# Patient Record
Sex: Male | Born: 1955
Health system: Southern US, Community
[De-identification: ages and names within clinical notes are randomized; demographics above are authoritative.]

## PROBLEM LIST (undated history)

## (undated) DIAGNOSIS — C119 Malignant neoplasm of nasopharynx, unspecified: Secondary | ICD-10-CM

## (undated) DIAGNOSIS — R4701 Aphasia: Secondary | ICD-10-CM

## (undated) DIAGNOSIS — I10 Essential (primary) hypertension: Secondary | ICD-10-CM

## (undated) DIAGNOSIS — C801 Malignant (primary) neoplasm, unspecified: Secondary | ICD-10-CM

## (undated) DIAGNOSIS — Z923 Personal history of irradiation: Secondary | ICD-10-CM

## (undated) DIAGNOSIS — H5440 Blindness, one eye, unspecified eye: Secondary | ICD-10-CM

## (undated) DIAGNOSIS — Z8619 Personal history of other infectious and parasitic diseases: Secondary | ICD-10-CM

## (undated) DIAGNOSIS — C61 Malignant neoplasm of prostate: Secondary | ICD-10-CM

## (undated) DIAGNOSIS — M1990 Unspecified osteoarthritis, unspecified site: Secondary | ICD-10-CM

## (undated) DIAGNOSIS — E890 Postprocedural hypothyroidism: Secondary | ICD-10-CM

## (undated) DIAGNOSIS — C760 Malignant neoplasm of head, face and neck: Secondary | ICD-10-CM

## (undated) HISTORY — PX: LYMPH NODE BIOPSY: SHX201

## (undated) HISTORY — DX: Malignant neoplasm of head, face and neck: C76.0

## (undated) HISTORY — PX: LARYNGOSCOPY: SUR817

---

## 2003-12-01 ENCOUNTER — Ambulatory Visit (HOSPITAL_COMMUNITY): Admission: RE | Admit: 2003-12-01 | Discharge: 2003-12-01 | Payer: Self-pay | Admitting: Family Medicine

## 2005-01-15 ENCOUNTER — Ambulatory Visit: Admission: RE | Admit: 2005-01-15 | Discharge: 2005-04-15 | Payer: Self-pay | Admitting: Radiation Oncology

## 2006-10-16 ENCOUNTER — Ambulatory Visit (HOSPITAL_COMMUNITY): Admission: RE | Admit: 2006-10-16 | Discharge: 2006-10-16 | Payer: Self-pay | Admitting: Family Medicine

## 2010-08-16 ENCOUNTER — Emergency Department (HOSPITAL_COMMUNITY): Admission: EM | Admit: 2010-08-16 | Discharge: 2010-08-16 | Payer: Self-pay | Admitting: Emergency Medicine

## 2011-08-19 ENCOUNTER — Emergency Department (HOSPITAL_COMMUNITY)
Admission: EM | Admit: 2011-08-19 | Discharge: 2011-08-19 | Disposition: A | Discharge status: Home / Self Care | Payer: Medicare Other | Attending: Emergency Medicine | Admitting: Emergency Medicine

## 2011-08-19 DIAGNOSIS — L723 Sebaceous cyst: Secondary | ICD-10-CM | POA: Insufficient documentation

## 2011-08-19 DIAGNOSIS — F17200 Nicotine dependence, unspecified, uncomplicated: Secondary | ICD-10-CM | POA: Insufficient documentation

## 2011-08-19 MED ORDER — DOXYCYCLINE HYCLATE 100 MG PO CAPS: 100.0000 mg | ORAL_CAPSULE | Freq: Two times a day (BID) | ORAL | Status: AC

## 2011-08-19 MED ORDER — LIDOCAINE HCL (PF) 1 % IJ SOLN
5.0000 mL | Freq: Once | INTRAMUSCULAR | Status: DC
  Filled 2011-08-19: qty 5

## 2011-08-19 NOTE — ED Notes (Signed)
Pt has cyst near left ear. No drainage noted. Pt states the cyst has been there for months

## 2011-08-19 NOTE — ED Notes (Signed)
Pt states he has had a growth on the left side of his face for months. Wanted to get it checked out today

## 2011-08-19 NOTE — ED Provider Notes (Signed)
History     CSN: [id number] Arrival date & time: 08/19/2011  9:40 AM  Chief Complaint  Patient presents with  . Cyst   HPI Comments: Patient c/o "knot" to his left face for several months.  States he became concerned about it after a friend told him that it needed to be "checked out".  He denies fever, pain, redness or recent change in its appearence  Patient is a 55 y.o. male presenting with abscess. The history is provided by the patient and the spouse.  Abscess  This is a chronic problem. The current episode started more than one week ago. The onset is undetermined. The problem occurs continuously. The problem has been unchanged. The abscess is present on the face. The problem is mild. The abscess is characterized by swelling. It is unknown what he was exposed to. Pertinent negatives include no decrease in physical activity, no fever, no diarrhea, no vomiting, no rhinorrhea, no sore throat and no cough. His past medical history does not include skin abscesses in family. There were no sick contacts. He has received no recent medical care.    History reviewed. No pertinent past medical history.  History reviewed. No pertinent past surgical history.  History reviewed. No pertinent family history.  History  Substance Use Topics  . Smoking status: Current Everyday Smoker    Types: Cigarettes  . Smokeless tobacco: Not on file  . Alcohol Use: No      Review of Systems  Constitutional: Negative for fever and chills.  HENT: Negative for ear pain, sore throat, rhinorrhea, neck pain and neck stiffness.   Respiratory: Negative for cough.   Gastrointestinal: Negative for vomiting and diarrhea.  Skin: Positive for wound.  Neurological: Negative for weakness, numbness and headaches.  Hematological: Negative for adenopathy. Does not bruise/bleed easily.  All other systems reviewed and are negative.    Physical Exam  BP 153/74  Pulse 102  Temp(Src) 97.8 F (36.6 C) (Oral)  Resp 18   Ht 6\' 3"  (1.905 m)  SpO2 99%  Physical Exam  Nursing note and vitals reviewed. Constitutional: He is oriented to person, place, and time. He appears well-developed and well-nourished. No distress.  HENT:  Head: Normocephalic and atraumatic.  Right Ear: External ear normal.  Left Ear: External ear normal.  Mouth/Throat: Oropharynx is clear and moist.       No mastoid tenderness or ear pain  Eyes: EOM are normal. Pupils are equal, round, and reactive to light.  Neck: Normal range of motion. Neck supple. No JVD present. No thyromegaly present.  Cardiovascular: Normal rate, regular rhythm and normal heart sounds.   Musculoskeletal: He exhibits no edema and no tenderness.  Lymphadenopathy:    He has no cervical adenopathy.  Neurological: He is alert and oriented to person, place, and time.  Skin:       Fluctuant, flesh colored  mass to left face just below the earlobe.  No drainage, erythema     ED Course  Procedures  MDM   4 cm papule just below the left earlobe.  Flesh colored with moderate fluctuation.  No surrounding erythema.  Likely sebaceous cyst w/o infection.  I will begin abx therapy and give pt referral for gen surgery under advisement of the EDP  Patient / Family / Caregiver understand and agree with initial ED impression and plan with expectations set for ED visit.   The patient appears reasonably screened and/or stabilized for discharge and I doubt any other medical condition or other  EMC requiring further screening, evaluation, or treatment in the ED at this time prior to discharge.       Briella Hobday L. Lizania Bouchard, [place] 08/27/11 1244

## 2011-09-04 NOTE — ED Provider Notes (Signed)
Medical screening examination/treatment/procedure(s) were performed by non-physician practitioner and as supervising physician I was immediately available for consultation/collaboration.   Joya Gaskins, MD 09/04/11 [phone]

## 2011-09-15 ENCOUNTER — Ambulatory Visit: Payer: Medicare Other | Admitting: Family Medicine

## 2011-09-16 ENCOUNTER — Other Ambulatory Visit (HOSPITAL_COMMUNITY): Payer: Self-pay | Admitting: Urology

## 2011-09-16 DIAGNOSIS — C61 Malignant neoplasm of prostate: Secondary | ICD-10-CM

## 2011-09-22 ENCOUNTER — Encounter (HOSPITAL_COMMUNITY): Payer: Medicare Other

## 2011-10-02 ENCOUNTER — Other Ambulatory Visit (HOSPITAL_COMMUNITY): Payer: Self-pay | Admitting: Internal Medicine

## 2011-10-02 ENCOUNTER — Ambulatory Visit (HOSPITAL_COMMUNITY)
Admission: RE | Admit: 2011-10-02 | Discharge: 2011-10-02 | Disposition: A | Discharge status: Home / Self Care | Payer: Medicare Other | Source: Ambulatory Visit | Attending: Internal Medicine | Admitting: Internal Medicine

## 2011-10-02 DIAGNOSIS — J40 Bronchitis, not specified as acute or chronic: Secondary | ICD-10-CM

## 2011-10-06 ENCOUNTER — Telehealth: Payer: Self-pay

## 2011-10-06 ENCOUNTER — Other Ambulatory Visit: Payer: Self-pay

## 2011-10-06 DIAGNOSIS — Z139 Encounter for screening, unspecified: Secondary | ICD-10-CM

## 2011-10-06 NOTE — Telephone Encounter (Signed)
OK for colonoscopy.  

## 2011-10-06 NOTE — Telephone Encounter (Signed)
Gastroenterology Pre-Procedure Form  Request Date: 10/06/2011    Requesting Physician: Dr. Felecia Shelling     PATIENT INFORMATION:  Brandon Hall is a 55 y.o., male (DOB=1956/07/09).  PROCEDURE: Procedure(s) requested: colonoscopy Procedure Reason: screening for colon cancer  PATIENT REVIEW QUESTIONS: The patient reports the following:   1. Diabetes Melitis: no 2. Joint replacements in the past 12 months: no 3. Major health problems in the past 3 months: no 4. Has an artificial valve or MVP:no 5. Has been advised in past to take antibiotics in advance of a procedure like teeth cleaning: no}    MEDICATIONS & ALLERGIES:    Patient reports the following regarding taking any blood thinners:   Plavix? no Aspirin?no Coumadin?  no  Patient confirms/reports the following medications:  No current outpatient prescriptions on file.    Patient confirms/reports the following allergies:  No Known Allergies  Patient is appropriate to schedule for requested procedure(s): yes  AUTHORIZATION INFORMATION Primary Insurance:   ID #:  Group #:  Pre-Cert / Auth required: Pre-Cert / Auth #:   Secondary Insurance: ,ID #:  Group #:  Pre-Cert / Auth required:  Pre-Cert / Auth #:   No orders of the defined types were placed in this encounter.    SCHEDULE INFORMATION: Procedure has been scheduled as follows:  Date: 10/20/2011      Time: 1:00 PM  Location: [facility] Short Stay  This Gastroenterology Pre-Precedure Form is being routed to the following provider(s) for review: R. Roetta Sessions, MD

## 2011-10-07 NOTE — Telephone Encounter (Signed)
Rx and instructions mailed to pt.  

## 2011-10-09 ENCOUNTER — Other Ambulatory Visit (HOSPITAL_COMMUNITY): Payer: Self-pay | Admitting: "Endocrinology

## 2011-10-09 DIAGNOSIS — E0590 Thyrotoxicosis, unspecified without thyrotoxic crisis or storm: Secondary | ICD-10-CM

## 2011-10-13 ENCOUNTER — Encounter (HOSPITAL_COMMUNITY): Payer: Self-pay

## 2011-10-13 ENCOUNTER — Encounter (HOSPITAL_COMMUNITY)
Admission: RE | Admit: 2011-10-13 | Discharge: 2011-10-13 | Disposition: A | Discharge status: Home / Self Care | Payer: Medicare Other | Source: Ambulatory Visit | Attending: "Endocrinology | Admitting: "Endocrinology

## 2011-10-13 DIAGNOSIS — E0590 Thyrotoxicosis, unspecified without thyrotoxic crisis or storm: Secondary | ICD-10-CM | POA: Insufficient documentation

## 2011-10-13 HISTORY — DX: Malignant (primary) neoplasm, unspecified: C80.1

## 2011-10-13 MED ORDER — SODIUM IODIDE I 131 CAPSULE
10.0000 | Freq: Once | INTRAVENOUS | Status: AC | PRN
  Administered 2011-10-13: 8 via ORAL

## 2011-10-14 ENCOUNTER — Encounter (HOSPITAL_COMMUNITY)
Admission: RE | Admit: 2011-10-14 | Discharge: 2011-10-14 | Disposition: A | Discharge status: Home / Self Care | Payer: Medicare Other | Source: Ambulatory Visit | Attending: "Endocrinology | Admitting: "Endocrinology

## 2011-10-14 DIAGNOSIS — E0590 Thyrotoxicosis, unspecified without thyrotoxic crisis or storm: Secondary | ICD-10-CM | POA: Insufficient documentation

## 2011-10-14 MED ORDER — SODIUM PERTECHNETATE TC 99M INJECTION
10.0000 | Freq: Once | INTRAVENOUS | Status: AC | PRN
  Administered 2011-10-14: 9.9 via INTRAVENOUS

## 2011-10-16 ENCOUNTER — Other Ambulatory Visit (HOSPITAL_COMMUNITY): Payer: Self-pay | Admitting: "Endocrinology

## 2011-10-16 DIAGNOSIS — E0500 Thyrotoxicosis with diffuse goiter without thyrotoxic crisis or storm: Secondary | ICD-10-CM

## 2011-10-17 ENCOUNTER — Telehealth: Payer: Self-pay | Admitting: Gastroenterology

## 2011-10-17 ENCOUNTER — Ambulatory Visit (HOSPITAL_COMMUNITY): Payer: Medicare Other

## 2011-10-17 MED ORDER — SODIUM CHLORIDE 0.45 % IV SOLN
Freq: Once | INTRAVENOUS | Status: AC
  Administered 2011-10-20: 11:00:00 via INTRAVENOUS

## 2011-10-17 NOTE — Telephone Encounter (Signed)
Pt procedure time moved up due to cancellation in the schedule- Pt is aware of new arrival time-11:00

## 2011-10-20 ENCOUNTER — Encounter (HOSPITAL_COMMUNITY)
Admission: RE | Disposition: A | Discharge status: Home / Self Care | Payer: Self-pay | Source: Ambulatory Visit | Attending: Internal Medicine

## 2011-10-20 ENCOUNTER — Ambulatory Visit (HOSPITAL_COMMUNITY)
Admission: RE | Admit: 2011-10-20 | Discharge: 2011-10-20 | Disposition: A | Discharge status: Home / Self Care | Payer: Medicare Other | Source: Ambulatory Visit | Attending: Internal Medicine | Admitting: Internal Medicine

## 2011-10-20 ENCOUNTER — Other Ambulatory Visit: Payer: Self-pay | Admitting: Internal Medicine

## 2011-10-20 ENCOUNTER — Encounter (HOSPITAL_COMMUNITY): Payer: Self-pay | Admitting: *Deleted

## 2011-10-20 DIAGNOSIS — Z1211 Encounter for screening for malignant neoplasm of colon: Secondary | ICD-10-CM | POA: Insufficient documentation

## 2011-10-20 DIAGNOSIS — Z79899 Other long term (current) drug therapy: Secondary | ICD-10-CM | POA: Insufficient documentation

## 2011-10-20 DIAGNOSIS — Z139 Encounter for screening, unspecified: Secondary | ICD-10-CM

## 2011-10-20 DIAGNOSIS — I10 Essential (primary) hypertension: Secondary | ICD-10-CM | POA: Insufficient documentation

## 2011-10-20 DIAGNOSIS — K5730 Diverticulosis of large intestine without perforation or abscess without bleeding: Secondary | ICD-10-CM

## 2011-10-20 DIAGNOSIS — R933 Abnormal findings on diagnostic imaging of other parts of digestive tract: Secondary | ICD-10-CM

## 2011-10-20 DIAGNOSIS — D126 Benign neoplasm of colon, unspecified: Secondary | ICD-10-CM | POA: Insufficient documentation

## 2011-10-20 HISTORY — DX: Essential (primary) hypertension: I10

## 2011-10-20 HISTORY — PX: COLONOSCOPY: SHX5424

## 2011-10-20 SURGERY — COLONOSCOPY
Anesthesia: Moderate Sedation

## 2011-10-20 MED ORDER — MIDAZOLAM HCL 5 MG/5ML IJ SOLN
INTRAMUSCULAR | Status: DC | PRN
  Administered 2011-10-20: 2 mg via INTRAVENOUS
  Administered 2011-10-20: 1 mg via INTRAVENOUS

## 2011-10-20 MED ORDER — STERILE WATER FOR IRRIGATION IR SOLN
Status: DC | PRN
  Administered 2011-10-20: 12:00:00

## 2011-10-20 MED ORDER — MEPERIDINE HCL 100 MG/ML IJ SOLN
INTRAMUSCULAR | Status: AC
  Filled 2011-10-20: qty 2

## 2011-10-20 MED ORDER — MIDAZOLAM HCL 5 MG/5ML IJ SOLN
INTRAMUSCULAR | Status: AC
  Filled 2011-10-20: qty 10

## 2011-10-20 MED ORDER — MEPERIDINE HCL 100 MG/ML IJ SOLN
INTRAMUSCULAR | Status: DC | PRN
  Administered 2011-10-20: 25 mg via INTRAVENOUS

## 2011-10-20 NOTE — H&P (Signed)
  Primary Care Physician:  Alice Reichert, MD Primary Gastroenterologist:  Dr. Jena Gauss  Pre-Procedure History & Physical: HPI:  Brandon Hall is a 55 y.o. male is here for a screening colonoscopy. No lower GI tract symptoms. No family history of colon polyps or colon cancer. This is the patient's first colonoscopy  Past Medical History  Diagnosis Date  . Hypertension   . Cancer     prostate cancer    History reviewed. No pertinent past surgical history.  Prior to Admission medications   Medication Sig Start Date End Date Taking? Authorizing Provider  propranolol (INDERAL) 20 MG tablet Take 20 mg by mouth 3 (three) times daily.     Yes Historical Provider, MD    Allergies as of 10/06/2011  . (No Known Allergies)    Family History  Problem Relation Age of Onset  . Colon cancer Father     History   Social History  . Marital Status: Married    Spouse Name: N/A    Number of Children: N/A  . Years of Education: N/A   Occupational History  . Not on file.   Social History Main Topics  . Smoking status: Current Everyday Smoker -- 1.0 packs/day for 40 years    Types: Cigarettes  . Smokeless tobacco: Not on file  . Alcohol Use: No  . Drug Use: No  . Sexually Active:    Other Topics Concern  . Not on file   Social History Narrative  . No narrative on file    Review of Systems: See HPI, otherwise negative ROS  Physical Exam: BP 141/85  Pulse 97  Temp(Src) 97.6 F (36.4 C) (Oral)  Resp 28  Ht 6' (1.829 m)  Wt 145 lb (65.772 kg)  BMI 19.67 kg/m2  SpO2 98% General:   Alert,  Well-developed, well-nourished, pleasant and cooperative in NAD Head:  Normocephalic and atraumatic. Mouth:  No deformity or lesions, dentition normal. Neck:  Supple; no masses or thyromegaly. Lungs:  Clear throughout to auscultation.   No wheezes, crackles, or rhonchi. No acute distress. Heart:  Regular rate and rhythm; no murmurs, clicks, rubs,  or gallops. Abdomen:  Soft, nontender and  nondistended. No masses, hepatosplenomegaly or hernias noted. Normal bowel sounds, without guarding, and without rebound.   Pulses:  Normal pulses noted. Extremities:  Without clubbing or edema. Impression/Plan: Brandon Hall is now here to undergo a screening colonoscopy.  First average risk examination the  Risks, benefits, limitations, imponderables and alternatives regarding colonoscopy have been reviewed with the patient. Questions have been answered. All parties agreeable.

## 2011-10-23 ENCOUNTER — Encounter (HOSPITAL_COMMUNITY)
Admission: RE | Admit: 2011-10-23 | Discharge: 2011-10-23 | Disposition: A | Discharge status: Home / Self Care | Payer: Medicare Other | Source: Ambulatory Visit | Attending: "Endocrinology | Admitting: "Endocrinology

## 2011-10-23 DIAGNOSIS — E0500 Thyrotoxicosis with diffuse goiter without thyrotoxic crisis or storm: Secondary | ICD-10-CM | POA: Insufficient documentation

## 2011-10-23 MED ORDER — SODIUM IODIDE I 131 CAPSULE
12.0000 | Freq: Once | INTRAVENOUS | Status: AC | PRN
  Administered 2011-10-23: 12 via ORAL

## 2011-10-24 ENCOUNTER — Encounter (HOSPITAL_COMMUNITY): Payer: Self-pay | Admitting: Internal Medicine

## 2012-02-23 ENCOUNTER — Encounter (HOSPITAL_COMMUNITY)
Admission: RE | Admit: 2012-02-23 | Discharge: 2012-02-23 | Disposition: A | Discharge status: Home / Self Care | Payer: Medicare Other | Source: Ambulatory Visit | Attending: Urology | Admitting: Urology

## 2012-02-23 ENCOUNTER — Encounter (HOSPITAL_COMMUNITY): Payer: Self-pay

## 2012-02-23 DIAGNOSIS — C61 Malignant neoplasm of prostate: Secondary | ICD-10-CM | POA: Insufficient documentation

## 2012-02-23 MED ORDER — TECHNETIUM TC 99M MEDRONATE IV KIT
25.0000 | PACK | Freq: Once | INTRAVENOUS | Status: AC | PRN
  Administered 2012-02-23: 24.8 via INTRAVENOUS

## 2012-05-19 ENCOUNTER — Other Ambulatory Visit (HOSPITAL_COMMUNITY): Payer: Self-pay | Admitting: Urology

## 2012-05-19 DIAGNOSIS — C61 Malignant neoplasm of prostate: Secondary | ICD-10-CM

## 2012-05-26 ENCOUNTER — Ambulatory Visit (HOSPITAL_COMMUNITY)
Admission: RE | Admit: 2012-05-26 | Discharge: 2012-05-26 | Disposition: A | Discharge status: Home / Self Care | Payer: Medicare Other | Source: Ambulatory Visit | Attending: Urology | Admitting: Urology

## 2012-05-26 DIAGNOSIS — C61 Malignant neoplasm of prostate: Secondary | ICD-10-CM

## 2012-05-26 DIAGNOSIS — N281 Cyst of kidney, acquired: Secondary | ICD-10-CM | POA: Insufficient documentation

## 2012-11-15 ENCOUNTER — Other Ambulatory Visit (HOSPITAL_COMMUNITY): Payer: Self-pay | Admitting: "Endocrinology

## 2012-11-15 DIAGNOSIS — E0500 Thyrotoxicosis with diffuse goiter without thyrotoxic crisis or storm: Secondary | ICD-10-CM

## 2012-11-29 ENCOUNTER — Encounter (HOSPITAL_COMMUNITY): Payer: Commercial Managed Care - HMO

## 2012-11-30 ENCOUNTER — Encounter (HOSPITAL_COMMUNITY): Payer: Medicare HMO

## 2012-12-10 ENCOUNTER — Ambulatory Visit (HOSPITAL_COMMUNITY): Payer: Medicare Other

## 2013-01-20 ENCOUNTER — Encounter (HOSPITAL_COMMUNITY): Payer: Self-pay

## 2013-01-20 ENCOUNTER — Encounter (HOSPITAL_COMMUNITY)
Admission: RE | Admit: 2013-01-20 | Discharge: 2013-01-20 | Disposition: A | Discharge status: Home / Self Care | Payer: Medicare Other | Source: Ambulatory Visit | Attending: "Endocrinology | Admitting: "Endocrinology

## 2013-01-20 DIAGNOSIS — E0590 Thyrotoxicosis, unspecified without thyrotoxic crisis or storm: Secondary | ICD-10-CM | POA: Insufficient documentation

## 2013-01-20 DIAGNOSIS — E0500 Thyrotoxicosis with diffuse goiter without thyrotoxic crisis or storm: Secondary | ICD-10-CM

## 2013-01-20 MED ORDER — SODIUM IODIDE I 131 CAPSULE
8.0000 | Freq: Once | INTRAVENOUS | Status: AC | PRN
  Administered 2013-01-20: 8 via ORAL

## 2013-01-21 ENCOUNTER — Encounter (HOSPITAL_COMMUNITY)
Admission: RE | Admit: 2013-01-21 | Discharge: 2013-01-21 | Disposition: A | Discharge status: Home / Self Care | Payer: Medicare Other | Source: Ambulatory Visit | Attending: "Endocrinology | Admitting: "Endocrinology

## 2013-01-21 MED ORDER — SODIUM PERTECHNETATE TC 99M INJECTION
10.0000 | Freq: Once | INTRAVENOUS | Status: AC | PRN
  Administered 2013-01-21: 10 via INTRAVENOUS

## 2013-01-26 ENCOUNTER — Encounter (HOSPITAL_COMMUNITY)
Admission: RE | Admit: 2013-01-26 | Discharge: 2013-01-26 | Disposition: A | Discharge status: Home / Self Care | Payer: Medicare Other | Source: Ambulatory Visit | Attending: "Endocrinology | Admitting: "Endocrinology

## 2013-01-26 ENCOUNTER — Encounter (HOSPITAL_COMMUNITY): Payer: Self-pay

## 2013-01-26 DIAGNOSIS — E0500 Thyrotoxicosis with diffuse goiter without thyrotoxic crisis or storm: Secondary | ICD-10-CM | POA: Insufficient documentation

## 2013-01-26 MED ORDER — SODIUM IODIDE I 131 CAPSULE
20.0000 | Freq: Once | INTRAVENOUS | Status: AC | PRN
  Administered 2013-01-26: 20 via ORAL

## 2013-08-07 ENCOUNTER — Encounter (HOSPITAL_COMMUNITY): Payer: Self-pay | Admitting: Emergency Medicine

## 2013-08-07 ENCOUNTER — Emergency Department (HOSPITAL_COMMUNITY)
Admission: EM | Admit: 2013-08-07 | Discharge: 2013-08-07 | Disposition: A | Discharge status: Home / Self Care | Payer: Medicare Other | Attending: Emergency Medicine | Admitting: Emergency Medicine

## 2013-08-07 DIAGNOSIS — I10 Essential (primary) hypertension: Secondary | ICD-10-CM | POA: Insufficient documentation

## 2013-08-07 DIAGNOSIS — F17200 Nicotine dependence, unspecified, uncomplicated: Secondary | ICD-10-CM | POA: Insufficient documentation

## 2013-08-07 DIAGNOSIS — Z8546 Personal history of malignant neoplasm of prostate: Secondary | ICD-10-CM | POA: Insufficient documentation

## 2013-08-07 DIAGNOSIS — L0231 Cutaneous abscess of buttock: Secondary | ICD-10-CM | POA: Insufficient documentation

## 2013-08-07 MED ORDER — LIDOCAINE HCL (PF) 1 % IJ SOLN
30.0000 mL | Freq: Once | INTRAMUSCULAR | Status: AC
  Administered 2013-08-07: 30 mL
  Filled 2013-08-07: qty 5

## 2013-08-07 MED ORDER — ONDANSETRON 8 MG PO TBDP
8.0000 mg | ORAL_TABLET | Freq: Once | ORAL | Status: AC
  Administered 2013-08-07: 8 mg via ORAL
  Filled 2013-08-07: qty 1

## 2013-08-07 MED ORDER — HYDROCODONE-ACETAMINOPHEN 5-325 MG PO TABS: 1.0000 | ORAL_TABLET | ORAL | Status: DC | PRN

## 2013-08-07 MED ORDER — HYDROMORPHONE HCL PF 1 MG/ML IJ SOLN
1.0000 mg | Freq: Once | INTRAMUSCULAR | Status: AC
  Administered 2013-08-07: 1 mg via INTRAMUSCULAR
  Filled 2013-08-07: qty 1

## 2013-08-07 MED ORDER — DOXYCYCLINE HYCLATE 100 MG PO CAPS: 100.0000 mg | ORAL_CAPSULE | Freq: Two times a day (BID) | ORAL | Status: DC

## 2013-08-07 MED ORDER — CEFTRIAXONE SODIUM 1 G IJ SOLR
1.0000 g | Freq: Once | INTRAMUSCULAR | Status: AC
  Administered 2013-08-07: 1 g via INTRAMUSCULAR
  Filled 2013-08-07: qty 10

## 2013-08-07 NOTE — ED Notes (Signed)
Pa neese to pt's bedside, abcess drained of large amount of pus/blood.  Pt tolerated well.  Wound culture sent to lab.  Sterile dressing applied to wound.

## 2013-08-07 NOTE — ED Notes (Signed)
States that he has a "boil" on his left buttock that started "one day last week."

## 2013-08-07 NOTE — ED Notes (Signed)
Pt alert & oriented x4, stable gait. Patient given discharge instructions, paperwork & prescription(s). Patient  instructed to stop at the registration desk to finish any additional paperwork. Patient verbalized understanding. Pt left department w/ no further questions. 

## 2013-08-07 NOTE — ED Provider Notes (Signed)
CSN: [id number]     Arrival date & time 08/07/13  0865 History     First MD Initiated Contact with Patient 08/07/13 [phone]     Chief Complaint  Patient presents with  . Abscess   (Consider location/radiation/quality/duration/timing/severity/associated sxs/prior Treatment) Patient is a 57 y.o. male presenting with abscess. The history is provided by the patient.  Abscess Location:  Ano-genital Ano-genital abscess location:  L buttock Size:  Large Abscess quality: fluctuance, painful, redness and warmth   Red streaking: no   Duration:  1 week Progression:  Worsening Pain details:    Quality:  Sharp and burning   Severity:  Severe   Timing:  Constant   Progression:  Worsening Chronicity:  New Context: not diabetes, not insect bite/sting and not skin injury   Relieved by:  Nothing Worsened by:  Nothing tried Ineffective treatments: OTC Boil Ease. Associated symptoms: no fever, no headaches, no nausea and no vomiting    Brandon Hall is a 57 y.o. male who presents to the ED with an abscess on his left buttock that started a week ago. The area has become more swollen and painful despite using Boil Ease.   Past Medical History  Diagnosis Date  . Hypertension   . Cancer     prostate cancer   Past Surgical History  Procedure Laterality Date  . Colonoscopy  10/20/2011    Procedure: COLONOSCOPY;  Surgeon: Corbin Ade, MD;  Location: [facility];  Service: Endoscopy;  Laterality: N/A;  1:00 pm   Family History  Problem Relation Age of Onset  . Colon cancer Father    History  Substance Use Topics  . Smoking status: Current Every Day Smoker -- 1.00 packs/day for 40 years    Types: Cigarettes  . Smokeless tobacco: Not on file  . Alcohol Use: No    Review of Systems  Constitutional: Negative for fever and chills.  Gastrointestinal: Negative for nausea, vomiting and abdominal pain.  Genitourinary:       Abscess left buttock  Musculoskeletal: Negative for back pain.   Skin: Positive for wound.  Neurological: Negative for headaches.  Psychiatric/Behavioral: The patient is not nervous/anxious.     Allergies  Review of patient's allergies indicates no known allergies.  Home Medications   Current Outpatient Rx  Name  Route  Sig  Dispense  Refill  . Benzocaine (BOIL-EASE EX)   Apply externally   Apply 1 application topically daily as needed (boil).          BP 139/72  Pulse 97  Temp(Src) 98.6 F (37 C)  Resp 18  Wt 140 lb (63.504 kg)  BMI 18.98 kg/m2  SpO2 97% Physical Exam  Nursing note and vitals reviewed. Constitutional: He is oriented to person, place, and time. He appears well-developed and well-nourished. No distress.  HENT:  Head: Normocephalic.  Eyes: EOM are normal.  Neck: Neck supple.  Cardiovascular: Normal rate.   Pulmonary/Chest: Effort normal.  Abdominal: Soft. There is no tenderness.  Musculoskeletal: Normal range of motion.  Neurological: He is alert and oriented to person, place, and time. No cranial nerve deficit.  Skin:  Approximately 6 cm. Tender, raised, fluctuant area left buttock.    Psychiatric: He has a normal mood and affect. His behavior is normal.    ED Course   Procedures INCISION AND DRAINAGE Performed by: Tucker Steedley Consent: Verbal consent obtained. Risks and benefits: risks, benefits and alternatives were discussed Type: abscess  Body area: left buttock  Draped with sterile  towels  Dilaudid 1 mg IM given 30 minutes prior to procedure  Cleaned with betadine  Draped with sterile towels   Anesthesia: local infiltration  Local anesthetic: lidocaine 1% without epinephrine  Anesthetic total: 3 ml  Incision made with # 11 blade   Complexity: complex Blunt dissection to break up loculations  Drainage: purulent  Drainage amount: LARGE  Irrigated with NSS  Packing material: 1/4 in iodoform gauze  Patient tolerance: Patient tolerated the procedure well with no immediate  complications.  Culture obtained from drainage   MDM  58 y.o. male with large abscess to left buttock. I&D with good results. Patient feeling better. Will give Rocephin 1 gram IM now and Rx for antibiotics. Patient to return in 2 days for follow up. He will return sooner for any problems.  Discussed with the patient and all questioned fully answered.   Medication List    TAKE these medications       doxycycline 100 MG capsule  Commonly known as:  VIBRAMYCIN  Take 1 capsule (100 mg total) by mouth 2 (two) times daily.     HYDROcodone-acetaminophen 5-325 MG per tablet  Commonly known as:  NORCO/VICODIN  Take 1 tablet by mouth every 4 (four) hours as needed.      ASK your doctor about these medications       BOIL-EASE EX  Apply 1 application topically daily as needed (boil).          [facility] Orlene Och, [place] 08/07/13 [phone]

## 2013-08-09 ENCOUNTER — Emergency Department (HOSPITAL_COMMUNITY)
Admission: EM | Admit: 2013-08-09 | Discharge: 2013-08-09 | Disposition: A | Discharge status: Home / Self Care | Payer: Medicare Other | Attending: Emergency Medicine | Admitting: Emergency Medicine

## 2013-08-09 ENCOUNTER — Encounter (HOSPITAL_COMMUNITY): Payer: Self-pay | Admitting: *Deleted

## 2013-08-09 DIAGNOSIS — Z8546 Personal history of malignant neoplasm of prostate: Secondary | ICD-10-CM | POA: Insufficient documentation

## 2013-08-09 DIAGNOSIS — I10 Essential (primary) hypertension: Secondary | ICD-10-CM | POA: Insufficient documentation

## 2013-08-09 DIAGNOSIS — F17200 Nicotine dependence, unspecified, uncomplicated: Secondary | ICD-10-CM | POA: Insufficient documentation

## 2013-08-09 DIAGNOSIS — Z5189 Encounter for other specified aftercare: Secondary | ICD-10-CM

## 2013-08-09 DIAGNOSIS — Z4800 Encounter for change or removal of nonsurgical wound dressing: Secondary | ICD-10-CM | POA: Insufficient documentation

## 2013-08-09 NOTE — ED Provider Notes (Signed)
  CSN: [id number]     Arrival date & time 08/09/13  1035 History     First MD Initiated Contact with Patient 08/09/13 1109     Chief Complaint  Patient presents with  . Wound Check   (Consider location/radiation/quality/duration/timing/severity/associated sxs/prior Treatment) HPI Brandon Hall is a 57 y.o. male who presents to the ED for recheck of abscess to the left buttock that required I&D. He has been taking doxycycline and the area has continued to drain. The packing has come out. Patient states he is feeling much better. No fever or other problems.  Past Medical History  Diagnosis Date  . Hypertension   . Cancer     prostate cancer   Past Surgical History  Procedure Laterality Date  . Colonoscopy  10/20/2011    Procedure: COLONOSCOPY;  Surgeon: Corbin Ade, MD;  Location: [facility];  Service: Endoscopy;  Laterality: N/A;  1:00 pm   Family History  Problem Relation Age of Onset  . Colon cancer Father    History  Substance Use Topics  . Smoking status: Current Every Day Smoker -- 1.00 packs/day for 40 years    Types: Cigarettes  . Smokeless tobacco: Not on file  . Alcohol Use: No    Review of Systems  Constitutional: Negative for fever and chills.  Gastrointestinal: Negative for nausea and vomiting.  Skin: Positive for wound.  Psychiatric/Behavioral: The patient is not nervous/anxious.     Allergies  Review of patient's allergies indicates no known allergies.  Home Medications   Current Outpatient Rx  Name  Route  Sig  Dispense  Refill  . Benzocaine (BOIL-EASE EX)   Apply externally   Apply 1 application topically daily as needed (boil).         Marland Kitchen doxycycline (VIBRAMYCIN) 100 MG capsule   Oral   Take 1 capsule (100 mg total) by mouth 2 (two) times daily.   20 capsule   0   . HYDROcodone-acetaminophen (NORCO/VICODIN) 5-325 MG per tablet   Oral   Take 1 tablet by mouth every 4 (four) hours as needed.   15 tablet   0    BP 139/96  Pulse 70   Temp(Src) 97.2 F (36.2 C) (Oral)  Resp 20  Ht 6' (1.829 m)  Wt 140 lb (63.504 kg)  BMI 18.98 kg/m2  SpO2 100% Physical Exam  Nursing note and vitals reviewed. Constitutional: He is oriented to person, place, and time. He appears well-developed and well-nourished. No distress.  HENT:  Head: Normocephalic.  Neck: Neck supple.  Cardiovascular: Normal rate.   Pulmonary/Chest: Effort normal.  Musculoskeletal:  Draining wound left buttock, less erythema, tenderness  and swelling today.  Neurological: He is alert and oriented to person, place, and time. No cranial nerve deficit.  Skin: Skin is warm and dry.  Psychiatric: He has a normal mood and affect. His behavior is normal.    ED Course   Procedures  MDM  57 y.o. male with improving abscess to left buttock. He will sit in warm tubs of water or apply warm wet compresses to the area to help with continued drainage. He will return for any problems.  He will continue his antibiotics and return as needed.   [facility] Orlene Och, NP 08/09/13 1758

## 2013-08-09 NOTE — ED Notes (Signed)
Pt here for recheck of abscess to buttock  Region, states that it was drained Friday, pt states that area is "doing good"

## 2013-08-10 LAB — CULTURE, ROUTINE-ABSCESS

## 2013-08-10 NOTE — ED Provider Notes (Signed)
Medical screening examination/treatment/procedure(s) were performed by non-physician practitioner and as supervising physician I was immediately available for consultation/collaboration. Aviel Davalos, MD, FACEP   Kaylinn Dedic L Waylon Koffler, MD 08/10/13 1142 

## 2013-08-10 NOTE — ED Provider Notes (Signed)
Medical screening examination/treatment/procedure(s) were performed by non-physician practitioner and as supervising physician I was immediately available for consultation/collaboration.  Seaira Byus, MD 08/10/13 0918 

## 2013-09-30 ENCOUNTER — Encounter: Payer: Self-pay | Admitting: Family Medicine

## 2013-09-30 ENCOUNTER — Ambulatory Visit (INDEPENDENT_AMBULATORY_CARE_PROVIDER_SITE_OTHER): Payer: Medicare Other | Admitting: Family Medicine

## 2013-09-30 VITALS — BP 136/85 | HR 74 | Temp 97.7°F | Ht 70.5 in | Wt 139.0 lb

## 2013-09-30 DIAGNOSIS — R030 Elevated blood-pressure reading, without diagnosis of hypertension: Secondary | ICD-10-CM

## 2013-09-30 DIAGNOSIS — R748 Abnormal levels of other serum enzymes: Secondary | ICD-10-CM

## 2013-09-30 DIAGNOSIS — E0500 Thyrotoxicosis with diffuse goiter without thyrotoxic crisis or storm: Secondary | ICD-10-CM | POA: Insufficient documentation

## 2013-09-30 DIAGNOSIS — E785 Hyperlipidemia, unspecified: Secondary | ICD-10-CM | POA: Insufficient documentation

## 2013-09-30 DIAGNOSIS — C801 Malignant (primary) neoplasm, unspecified: Secondary | ICD-10-CM

## 2013-09-30 DIAGNOSIS — Z23 Encounter for immunization: Secondary | ICD-10-CM

## 2013-09-30 DIAGNOSIS — I10 Essential (primary) hypertension: Secondary | ICD-10-CM | POA: Insufficient documentation

## 2013-09-30 DIAGNOSIS — F17200 Nicotine dependence, unspecified, uncomplicated: Secondary | ICD-10-CM

## 2013-09-30 DIAGNOSIS — Z720 Tobacco use: Secondary | ICD-10-CM | POA: Insufficient documentation

## 2013-09-30 DIAGNOSIS — R7402 Elevation of levels of lactic acid dehydrogenase (LDH): Secondary | ICD-10-CM

## 2013-09-30 DIAGNOSIS — IMO0001 Reserved for inherently not codable concepts without codable children: Secondary | ICD-10-CM

## 2013-09-30 DIAGNOSIS — H5440 Blindness, one eye, unspecified eye: Secondary | ICD-10-CM | POA: Insufficient documentation

## 2013-09-30 NOTE — Progress Notes (Signed)
Patient ID: Brandon Hall, male   DOB: Jan 31, 1956, 57 y.o.   MRN: [id number] SUBJECTIVE: CC: Chief Complaint  Patient presents with  . Establish Care    HPI: 1) h/o prostate cancer but further evaluation was negative.  2) h/o hyperthyroidism: had 2 radioactive iodine treatments. Doing fine.  Breakfast: eggs and bacon Lunch: weiner Supper: green beans salad, sometime hamburger helper.  Past Medical History  Diagnosis Date  . Cancer     prostate cancer  . Hypertension   . Thyroid disease   . Graves disease    Past Surgical History  Procedure Laterality Date  . Colonoscopy  10/20/2011    Procedure: COLONOSCOPY;  Surgeon: Corbin Ade, MD;  Location: [facility];  Service: Endoscopy;  Laterality: N/A;  1:00 pm   History   Social History  . Marital Status: Married    Spouse Name: N/A    Number of Children: N/A  . Years of Education: N/A   Occupational History  . Not on file.   Social History Main Topics  . Smoking status: Current Every Day Smoker -- 1.00 packs/day for 40 years    Types: Cigarettes  . Smokeless tobacco: Not on file  . Alcohol Use: No  . Drug Use: No  . Sexual Activity:    Other Topics Concern  . Not on file   Social History Narrative  . No narrative on file   Family History  Problem Relation Age of Onset  . Colon cancer Father    No current outpatient prescriptions on file prior to visit.   No current facility-administered medications on file prior to visit.   No Known Allergies  There is no immunization history on file for this patient. Prior to Admission medications   Not on File     ROS: As above in the HPI. All other systems are stable or negative.  OBJECTIVE: APPEARANCE:  Patient in no acute distress.The patient appeared well nourished and normally developed. Acyanotic. Waist: VITAL SIGNS:BP 136/85  Pulse 74  Temp(Src) 97.7 F (36.5 C) (Oral)  Ht 5' 10.5" (1.791 m)  Wt 139 lb (63.05 kg)  BMI 19.66 kg/m2  AAM slim  built  SKIN: warm and  Dry without overt rashes, tattoos and scars  HEAD and Neck: without JVD, Head and scalp: normal Eyes:No scleral icterus.Right eye opaque. Left eye Pupil reactive to light . Able to see clearly. Has a catarct Ears: Auricle normal, canal normal, Tympanic membranes normal, insufflation normal. Nose: normal Throat: normal Neck & thyroid: normal  CHEST & LUNGS: Chest wall: normal Lungs: Clear  CVS: Reveals the PMI to be normally located. Regular rhythm, First and Second Heart sounds are normal,  absence of murmurs, rubs or gallops. Peripheral vasculature: Radial pulses: normal Dorsal pedis pulses: normal Posterior pulses: normal  ABDOMEN:  Appearance: normal Benign, no organomegaly, no masses, no Abdominal Aortic enlargement. No Guarding , no rebound. No Bruits. Bowel sounds: normal  RECTAL: N/A GU: N/A  EXTREMETIES: nonedematous.  MUSCULOSKELETAL:  Spine: normal Joints: intact  NEUROLOGIC: oriented to time,place and person; nonfocal. Strength is normal Sensory is normal Reflexes are normal Cranial Nerves are normal.  ASSESSMENT: Blind right eye  Tobacco user  Graves disease - Plan: Thyroid Panel With TSH  Cancer - Plan: PSA, total and free  Dyslipidemia - Plan: CMP14+EGFR, Lipid panel  Elevated BP  Need for prophylactic vaccination and inoculation against influenza    PLAN: Orders Placed This Encounter  Procedures  . CMP14+EGFR  . Lipid  panel  . Thyroid Panel With TSH  . PSA, total and free    smoking cessation counseling and handout in the AVS.  His wife was present who is also an established patient.counselled on healthy eating and exercising.healthy lifestyle recommended.  Health maintenance. Annual eye exam.  No evidence he is Hypertensive.  Return in about 4 months (around 01/31/2014) for Recheck medical problems.  Kenyetta Fife P. [place] Charon, M.D.

## 2013-09-30 NOTE — Patient Instructions (Addendum)
Smoking Cessation Quitting smoking is important to your health and has many advantages. However, it is not always easy to quit since nicotine is a very addictive drug. Often times, people try 3 times or more before being able to quit. This document explains the best ways for you to prepare to quit smoking. Quitting takes hard work and a lot of effort, but you can do it. ADVANTAGES OF QUITTING SMOKING  You will live longer, feel better, and live better.  Your body will feel the impact of quitting smoking almost immediately.  Within 20 minutes, blood pressure decreases. Your pulse returns to its normal level.  After 8 hours, carbon monoxide levels in the blood return to normal. Your oxygen level increases.  After 24 hours, the chance of having a heart attack starts to decrease. Your breath, hair, and body stop smelling like smoke.  After 48 hours, damaged nerve endings begin to recover. Your sense of taste and smell improve.  After 72 hours, the body is virtually free of nicotine. Your bronchial tubes relax and breathing becomes easier.  After 2 to 12 weeks, lungs can hold more air. Exercise becomes easier and circulation improves.  The risk of having a heart attack, stroke, cancer, or lung disease is greatly reduced.  After 1 year, the risk of coronary heart disease is cut in half.  After 5 years, the risk of stroke falls to the same as a nonsmoker.  After 10 years, the risk of lung cancer is cut in half and the risk of other cancers decreases significantly.  After 15 years, the risk of coronary heart disease drops, usually to the level of a nonsmoker.  If you are pregnant, quitting smoking will improve your chances of having a healthy baby.  The people you live with, especially any children, will be healthier.  You will have extra money to spend on things other than cigarettes. QUESTIONS TO THINK ABOUT BEFORE ATTEMPTING TO QUIT You may want to talk about your answers with your  caregiver.  Why do you want to quit?  If you tried to quit in the past, what helped and what did not?  What will be the most difficult situations for you after you quit? How will you plan to handle them?  Who can help you through the tough times? Your family? Friends? A caregiver?  What pleasures do you get from smoking? What ways can you still get pleasure if you quit? Here are some questions to ask your caregiver:  How can you help me to be successful at quitting?  What medicine do you think would be best for me and how should I take it?  What should I do if I need more help?  What is smoking withdrawal like? How can I get information on withdrawal? GET READY  Set a quit date.  Change your environment by getting rid of all cigarettes, ashtrays, matches, and lighters in your home, car, or work. Do not let people smoke in your home.  Review your past attempts to quit. Think about what worked and what did not. GET SUPPORT AND ENCOURAGEMENT You have a better chance of being successful if you have help. You can get support in many ways.  Tell your family, friends, and co-workers that you are going to quit and need their support. Ask them not to smoke around you.  Get individual, group, or telephone counseling and support. Programs are available at local hospitals and health centers. Call your local health department for   information about programs in your area.  Spiritual beliefs and practices may help some smokers quit.  Download a "quit meter" on your computer to keep track of quit statistics, such as how long you have gone without smoking, cigarettes not smoked, and money saved.  Get a self-help book about quitting smoking and staying off of tobacco. LEARN NEW SKILLS AND BEHAVIORS  Distract yourself from urges to smoke. Talk to someone, go for a walk, or occupy your time with a task.  Change your normal routine. Take a different route to work. Drink tea instead of coffee.  Eat breakfast in a different place.  Reduce your stress. Take a hot bath, exercise, or read a book.  Plan something enjoyable to do every day. Reward yourself for not smoking.  Explore interactive web-based programs that specialize in helping you quit. GET MEDICINE AND USE IT CORRECTLY Medicines can help you stop smoking and decrease the urge to smoke. Combining medicine with the above behavioral methods and support can greatly increase your chances of successfully quitting smoking.  Nicotine replacement therapy helps deliver nicotine to your body without the negative effects and risks of smoking. Nicotine replacement therapy includes nicotine gum, lozenges, inhalers, nasal sprays, and skin patches. Some may be available over-the-counter and others require a prescription.  Antidepressant medicine helps people abstain from smoking, but how this works is unknown. This medicine is available by prescription.  Nicotinic receptor partial agonist medicine simulates the effect of nicotine in your brain. This medicine is available by prescription. Ask your caregiver for advice about which medicines to use and how to use them based on your health history. Your caregiver will tell you what side effects to look out for if you choose to be on a medicine or therapy. Carefully read the information on the package. Do not use any other product containing nicotine while using a nicotine replacement product.  RELAPSE OR DIFFICULT SITUATIONS Most relapses occur within the first 3 months after quitting. Do not be discouraged if you start smoking again. Remember, most people try several times before finally quitting. You may have symptoms of withdrawal because your body is used to nicotine. You may crave cigarettes, be irritable, feel very hungry, cough often, get headaches, or have difficulty concentrating. The withdrawal symptoms are only temporary. They are strongest when you first quit, but they will go away within  10 14 days. To reduce the chances of relapse, try to:  Avoid drinking alcohol. Drinking lowers your chances of successfully quitting.  Reduce the amount of caffeine you consume. Once you quit smoking, the amount of caffeine in your body increases and can give you symptoms, such as a rapid heartbeat, sweating, and anxiety.  Avoid smokers because they can make you want to smoke.  Do not let weight gain distract you. Many smokers will gain weight when they quit, usually less than 10 pounds. Eat a healthy diet and stay active. You can always lose the weight gained after you quit.  Find ways to improve your mood other than smoking. FOR MORE INFORMATION  www.smokefree.gov  Document Released: 12/09/2001 Document Revised: 06/15/2012 Document Reviewed: 03/25/2012 ExitCare Patient Information 2014 [employer].  

## 2013-10-01 LAB — CMP14+EGFR
ALT: 47 IU/L — ABNORMAL HIGH (ref 0–44)
AST: 66 IU/L — ABNORMAL HIGH (ref 0–40)
Albumin/Globulin Ratio: 2 (ref 1.1–2.5)
Albumin: 4.9 g/dL (ref 3.5–5.5)
Alkaline Phosphatase: 81 IU/L (ref 39–117)
BUN/Creatinine Ratio: 8 — ABNORMAL LOW (ref 9–20)
BUN: 8 mg/dL (ref 6–24)
CO2: 28 mmol/L (ref 18–29)
Calcium: 9 mg/dL (ref 8.7–10.2)
Chloride: 96 mmol/L — ABNORMAL LOW (ref 97–108)
Creatinine, Ser: 1.04 mg/dL (ref 0.76–1.27)
GFR calc Af Amer: 91 mL/min/{1.73_m2} (ref >59)
GFR calc non Af Amer: 79 mL/min/{1.73_m2} (ref >59)
Globulin, Total: 2.4 g/dL (ref 1.5–4.5)
Glucose: 80 mg/dL (ref 65–99)
Potassium: 4.4 mmol/L (ref 3.5–5.2)
Sodium: 139 mmol/L (ref 134–144)
Total Bilirubin: 0.6 mg/dL (ref 0.0–1.2)
Total Protein: 7.3 g/dL (ref 6.0–8.5)

## 2013-10-01 LAB — THYROID PANEL WITH TSH
Free Thyroxine Index: 0.1 — ABNORMAL LOW (ref 1.2–4.9)
T3 Uptake Ratio: 15 % — ABNORMAL LOW (ref 24–39)
T4, Total: 0.8 ug/dL — CL (ref 4.5–12.0)
TSH: 93.22 u[IU]/mL — ABNORMAL HIGH (ref 0.450–4.500)

## 2013-10-01 LAB — LIPID PANEL
Chol/HDL Ratio: 2.7 ratio units (ref 0.0–5.0)
Cholesterol, Total: 243 mg/dL — ABNORMAL HIGH (ref 100–199)
HDL: 90 mg/dL (ref >39)
LDL Calculated: 140 mg/dL — ABNORMAL HIGH (ref 0–99)
Triglycerides: 67 mg/dL (ref 0–149)
VLDL Cholesterol Cal: 13 mg/dL (ref 5–40)

## 2013-10-01 LAB — PSA, TOTAL AND FREE
PSA, Free Pct: 2.7 %
PSA, Free: 0.27 ng/mL
PSA: 10 ng/mL — ABNORMAL HIGH (ref 0.0–4.0)

## 2013-10-02 ENCOUNTER — Other Ambulatory Visit: Payer: Self-pay | Admitting: Family Medicine

## 2013-10-02 DIAGNOSIS — E039 Hypothyroidism, unspecified: Secondary | ICD-10-CM | POA: Insufficient documentation

## 2013-10-02 DIAGNOSIS — R748 Abnormal levels of other serum enzymes: Secondary | ICD-10-CM | POA: Insufficient documentation

## 2013-10-02 DIAGNOSIS — R972 Elevated prostate specific antigen [PSA]: Secondary | ICD-10-CM

## 2013-10-02 MED ORDER — LEVOTHYROXINE SODIUM 50 MCG PO TABS: 50.0000 ug | ORAL_TABLET | Freq: Every day | ORAL | Status: DC

## 2013-10-02 NOTE — Progress Notes (Signed)
Quick Note:  Labs abnormal.he also needs to come for a follow up in 6 weeks. To recheck the hypothyroidism ______

## 2013-10-02 NOTE — Addendum Note (Signed)
Addended by: Ileana Ladd on: 10/02/2013 09:32 PM   Modules accepted: Orders

## 2013-10-02 NOTE — Progress Notes (Signed)
Quick Note:  Labs abnormal. Now hypothyroid. Also liver enzymes elevated.: added hepatitis and iron test to check. Needs to avoid all alcohol. Start on levothyroxine ordered in [facility]. ______

## 2013-10-14 ENCOUNTER — Other Ambulatory Visit (INDEPENDENT_AMBULATORY_CARE_PROVIDER_SITE_OTHER): Payer: Medicare Other

## 2013-10-14 DIAGNOSIS — R748 Abnormal levels of other serum enzymes: Secondary | ICD-10-CM

## 2013-10-14 DIAGNOSIS — R7402 Elevation of levels of lactic acid dehydrogenase (LDH): Secondary | ICD-10-CM

## 2013-10-14 NOTE — Progress Notes (Signed)
Patient came in for labs only.

## 2013-10-15 LAB — HEPATITIS PANEL, ACUTE
Hep A IgM: NEGATIVE
Hep B C IgM: NEGATIVE
Hep C Virus Ab: <0.1 s/co ratio (ref 0.0–0.9)
Hepatitis B Surface Ag: NEGATIVE

## 2013-10-15 LAB — FERRITIN: Ferritin: 100 ng/mL (ref 30–400)

## 2013-10-15 NOTE — Progress Notes (Signed)
Quick Note:  Call patient. Labs normal for the work up of causes for the elevated liver enzymes.  plan: Avoid all alcohol, tylenol and OTC medications without consulting [place]. RTC in 6 weeks to recheck the liver. ______

## 2013-11-14 ENCOUNTER — Other Ambulatory Visit: Payer: Medicare Other

## 2013-12-01 ENCOUNTER — Ambulatory Visit (INDEPENDENT_AMBULATORY_CARE_PROVIDER_SITE_OTHER): Payer: Medicare Other | Admitting: Family Medicine

## 2013-12-01 ENCOUNTER — Encounter: Payer: Self-pay | Admitting: Family Medicine

## 2013-12-01 VITALS — BP 161/94 | HR 75 | Temp 97.1°F | Ht 70.5 in | Wt 137.0 lb

## 2013-12-01 DIAGNOSIS — L0291 Cutaneous abscess, unspecified: Secondary | ICD-10-CM

## 2013-12-01 MED ORDER — HYDROCODONE-ACETAMINOPHEN 5-325 MG PO TABS: 1.0000 | ORAL_TABLET | ORAL | Status: DC | PRN

## 2013-12-01 MED ORDER — CEFTRIAXONE SODIUM 1 G IJ SOLR
1.0000 g | Freq: Once | INTRAMUSCULAR | Status: AC
  Administered 2013-12-01: 1 g via INTRAMUSCULAR

## 2013-12-01 NOTE — Progress Notes (Signed)
Subjective:    Patient ID: Brandon Hall, male    DOB: Jun 20, 1956, 57 y.o.   MRN: [id number]  HPI This 57 y.o. male presents for evaluation of abscess on left buttock. He is having severe pain.  He denies having any problems with BM's or  Rectal pain.   Review of Systems C/o Left buttock pain.   No chest pain, SOB, HA, dizziness, vision change, N/V, diarrhea, constipation, dysuria, urinary urgency or frequency, myalgias, arthralgias or rash.  Objective:   Physical Exam Vital signs noted  Well developed well nourished male.  HEENT - Head atraumatic Normocephalic Respiratory - Lungs CTA bilateral Cardiac - RRR S1 and S2 without murmur GI - Abdomen soft Nontender and bowel sounds active x 4 Skin - Left buttock abscess approximately 6cm and tender.  Procedure - After prepping abscess/ cyst with betadine, 3 cc's lido w/ epi injected into the cyst and after adequate anesthesia A stab incision is made into the fluctuant upper area of the cyst and serous sanguin drainage is drained approx. 10 cc's.  Bulky 4x4 dressing is applied.        Assessment & Plan:  Abscess - Plan: cefTRIAXone (ROCEPHIN) injection 1 g, Aerobic culture Incision and drainage of abscess.  Bactrim DS one po bid x 10 days #20, Norco 5mg  one po qid Prn #20.  Follow up in 1 week for wound check.  Deatra Canter FNP

## 2013-12-01 NOTE — Patient Instructions (Signed)
Abscess An abscess is an infected area that contains a collection of pus and debris.It can occur in almost any part of the body. An abscess is also known as a furuncle or boil. CAUSES  An abscess occurs when tissue gets infected. This can occur from blockage of oil or sweat glands, infection of hair follicles, or a minor injury to the skin. As the body tries to fight the infection, pus collects in the area and creates pressure under the skin. This pressure causes pain. People with weakened immune systems have difficulty fighting infections and get certain abscesses more often.  SYMPTOMS Usually an abscess develops on the skin and becomes a painful mass that is red, warm, and tender. If the abscess forms under the skin, you may feel a moveable soft area under the skin. Some abscesses break open (rupture) on their own, but most will continue to get worse without care. The infection can spread deeper into the body and eventually into the bloodstream, causing you to feel ill.  DIAGNOSIS  Your caregiver will take your medical history and perform a physical exam. A sample of fluid may also be taken from the abscess to determine what is causing your infection. TREATMENT  Your caregiver may prescribe antibiotic medicines to fight the infection. However, taking antibiotics alone usually does not cure an abscess. Your caregiver may need to make a small cut (incision) in the abscess to drain the pus. In some cases, gauze is packed into the abscess to reduce pain and to continue draining the area. HOME CARE INSTRUCTIONS   Only take over-the-counter or prescription medicines for pain, discomfort, or fever as directed by your caregiver.  If you were prescribed antibiotics, take them as directed. Finish them even if you start to feel better.  If gauze is used, follow your caregiver's directions for changing the gauze.  To avoid spreading the infection:  Keep your draining abscess covered with a  bandage.  Wash your hands well.  Do not share personal care items, towels, or whirlpools with others.  Avoid skin contact with others.  Keep your skin and clothes clean around the abscess.  Keep all follow-up appointments as directed by your caregiver. SEEK MEDICAL CARE IF:   You have increased pain, swelling, redness, fluid drainage, or bleeding.  You have muscle aches, chills, or a general ill feeling.  You have a fever. MAKE SURE YOU:   Understand these instructions.  Will watch your condition.  Will get help right away if you are not doing well or get worse. Document Released: 09/24/2005 Document Revised: 06/15/2012 Document Reviewed: 02/27/2012 ExitCare Patient Information 2014 [employer].  

## 2013-12-05 LAB — AEROBIC CULTURE

## 2013-12-09 ENCOUNTER — Ambulatory Visit (INDEPENDENT_AMBULATORY_CARE_PROVIDER_SITE_OTHER): Payer: Medicare Other | Admitting: Family Medicine

## 2013-12-09 ENCOUNTER — Encounter: Payer: Self-pay | Admitting: Family Medicine

## 2013-12-09 VITALS — BP 150/80 | HR 60 | Temp 97.5°F | Ht 70.5 in | Wt 141.8 lb

## 2013-12-09 DIAGNOSIS — L0291 Cutaneous abscess, unspecified: Secondary | ICD-10-CM

## 2013-12-09 NOTE — Patient Instructions (Signed)
Abscess An abscess is an infected area that contains a collection of pus and debris.It can occur in almost any part of the body. An abscess is also known as a furuncle or boil. CAUSES  An abscess occurs when tissue gets infected. This can occur from blockage of oil or sweat glands, infection of hair follicles, or a minor injury to the skin. As the body tries to fight the infection, pus collects in the area and creates pressure under the skin. This pressure causes pain. People with weakened immune systems have difficulty fighting infections and get certain abscesses more often.  SYMPTOMS Usually an abscess develops on the skin and becomes a painful mass that is red, warm, and tender. If the abscess forms under the skin, you may feel a moveable soft area under the skin. Some abscesses break open (rupture) on their own, but most will continue to get worse without care. The infection can spread deeper into the body and eventually into the bloodstream, causing you to feel ill.  DIAGNOSIS  Your caregiver will take your medical history and perform a physical exam. A sample of fluid may also be taken from the abscess to determine what is causing your infection. TREATMENT  Your caregiver may prescribe antibiotic medicines to fight the infection. However, taking antibiotics alone usually does not cure an abscess. Your caregiver may need to make a small cut (incision) in the abscess to drain the pus. In some cases, gauze is packed into the abscess to reduce pain and to continue draining the area. HOME CARE INSTRUCTIONS   Only take over-the-counter or prescription medicines for pain, discomfort, or fever as directed by your caregiver.  If you were prescribed antibiotics, take them as directed. Finish them even if you start to feel better.  If gauze is used, follow your caregiver's directions for changing the gauze.  To avoid spreading the infection:  Keep your draining abscess covered with a  bandage.  Wash your hands well.  Do not share personal care items, towels, or whirlpools with others.  Avoid skin contact with others.  Keep your skin and clothes clean around the abscess.  Keep all follow-up appointments as directed by your caregiver. SEEK MEDICAL CARE IF:   You have increased pain, swelling, redness, fluid drainage, or bleeding.  You have muscle aches, chills, or a general ill feeling.  You have a fever. MAKE SURE YOU:   Understand these instructions.  Will watch your condition.  Will get help right away if you are not doing well or get worse. Document Released: 09/24/2005 Document Revised: 06/15/2012 Document Reviewed: 02/27/2012 ExitCare Patient Information 2014 [employer].  

## 2013-12-09 NOTE — Progress Notes (Signed)
Subjective:    Patient ID: Brandon Hall, male    DOB: December 04, 1956, 57 y.o.   MRN: [id number]  HPI This 57 y.o. male presents for evaluation of follow up on abscess. He has been treated with Keflex and bactrim a week ago.  He is feeling a lot better.   Review of Systems C/o abscess left buttock. No chest pain, SOB, HA, dizziness, vision change, N/V, diarrhea, constipation, dysuria, urinary urgency or frequency, myalgias, arthralgias or rash.     Objective:   Physical Exam  Vital signs noted  Well developed well nourished male.  HEENT - Head atraumatic Normocephalic                Eyes - PERRLA, Conjuctiva - clear Sclera- Clear EOMI                Ears - EAC's Wnl TM's Wnl Gross Hearing WNL                Throat - oropharanx wnl Respiratory - Lungs CTA bilateral Cardiac - RRR S1 and S2 without murmur GI - Abdomen soft Nontender and bowel sounds active x 4 Extremities - No edema. Neuro - Grossly intact. Skin - abscess left buttock resolving and healing well.     Assessment & Plan:  Cellulitis and abscess Continue bactrim DS. Wound is just about resolved and instructed to follow up prn.  Deatra Canter FNP

## 2013-12-28 ENCOUNTER — Other Ambulatory Visit (INDEPENDENT_AMBULATORY_CARE_PROVIDER_SITE_OTHER): Payer: Medicare Other

## 2013-12-28 DIAGNOSIS — R7989 Other specified abnormal findings of blood chemistry: Secondary | ICD-10-CM

## 2013-12-28 NOTE — Progress Notes (Signed)
Patient came in for labs only.

## 2013-12-29 LAB — HEPATIC FUNCTION PANEL
ALT: 15 IU/L (ref 0–44)
AST: 29 IU/L (ref 0–40)
Albumin: 5.1 g/dL (ref 3.5–5.5)
Alkaline Phosphatase: 71 IU/L (ref 39–117)
Bilirubin, Direct: 0.09 mg/dL (ref 0.00–0.40)
Total Bilirubin: 0.5 mg/dL (ref 0.0–1.2)
Total Protein: 7.6 g/dL (ref 6.0–8.5)

## 2013-12-30 NOTE — Progress Notes (Signed)
Quick Note:  Call patient. Labs normal now. No change in plan. ______

## 2014-01-19 ENCOUNTER — Other Ambulatory Visit (HOSPITAL_COMMUNITY): Payer: Self-pay | Admitting: Urology

## 2014-01-19 DIAGNOSIS — C61 Malignant neoplasm of prostate: Secondary | ICD-10-CM

## 2014-01-26 ENCOUNTER — Encounter: Payer: Self-pay | Admitting: Nurse Practitioner

## 2014-01-26 ENCOUNTER — Ambulatory Visit (INDEPENDENT_AMBULATORY_CARE_PROVIDER_SITE_OTHER): Payer: Medicare HMO | Admitting: Nurse Practitioner

## 2014-01-26 VITALS — BP 163/85 | HR 64 | Temp 96.8°F | Ht 70.5 in | Wt 139.0 lb

## 2014-01-26 DIAGNOSIS — B029 Zoster without complications: Secondary | ICD-10-CM

## 2014-01-26 NOTE — Patient Instructions (Signed)
Shingles Shingles (herpes zoster) is an infection that is caused by the same virus that causes chickenpox (varicella). The infection causes a painful skin rash and fluid-filled blisters, which eventually break open, crust over, and heal. It may occur in any area of the body, but it usually affects only one side of the body or face. The pain of shingles usually lasts about 1 month. However, some people with shingles may develop long-term (chronic) pain in the affected area of the body. Shingles often occurs many years after the person had chickenpox. It is more common:  In people older than 50 years.  In people with weakened immune systems, such as those with HIV, AIDS, or cancer.  In people taking medicines that weaken the immune system, such as transplant medicines.  In people under great stress. CAUSES  Shingles is caused by the varicella zoster virus (VZV), which also causes chickenpox. After a person is infected with the virus, it can remain in the person's body for years in an inactive state (dormant). To cause shingles, the virus reactivates and breaks out as an infection in a nerve root. The virus can be spread from person to person (contagious) through contact with open blisters of the shingles rash. It will only spread to people who have not had chickenpox. When these people are exposed to the virus, they may develop chickenpox. They will not develop shingles. Once the blisters scab over, the person is no longer contagious and cannot spread the virus to others. SYMPTOMS  Shingles shows up in stages. The initial symptoms may be pain, itching, and tingling in an area of the skin. This pain is usually described as burning, stabbing, or throbbing.In a few days or weeks, a painful red rash will appear in the area where the pain, itching, and tingling were felt. The rash is usually on one side of the body in a band or belt-like pattern. Then, the rash usually turns into fluid-filled blisters. They  will scab over and dry up in approximately 2 3 weeks. Flu-like symptoms may also occur with the initial symptoms, the rash, or the blisters. These may include:  Fever.  Chills.  Headache.  Upset stomach. DIAGNOSIS  Your caregiver will perform a skin exam to diagnose shingles. Skin scrapings or fluid samples may also be taken from the blisters. This sample will be examined under a microscope or sent to a lab for further testing. TREATMENT  There is no specific cure for shingles. Your caregiver will likely prescribe medicines to help you manage the pain, recover faster, and avoid long-term problems. This may include antiviral drugs, anti-inflammatory drugs, and pain medicines. HOME CARE INSTRUCTIONS   Take a cool bath or apply cool compresses to the area of the rash or blisters as directed. This may help with the pain and itching.   Only take over-the-counter or prescription medicines as directed by your caregiver.   Rest as directed by your caregiver.  Keep your rash and blisters clean with mild soap and cool water or as directed by your caregiver.  Do not pick your blisters or scratch your rash. Apply an anti-itch cream or numbing creams to the affected area as directed by your caregiver.  Keep your shingles rash covered with a loose bandage (dressing).  Avoid skin contact with:  Babies.   Pregnant women.   Children with eczema.   Elderly people with transplants.   People with chronic illnesses, such as leukemia or AIDS.   Wear loose-fitting clothing to help ease   the pain of material rubbing against the rash.  Keep all follow-up appointments with your caregiver.If the area involved is on your face, you may receive a referral for follow-up to a specialist, such as an eye doctor (ophthalmologist) or an ear, nose, and throat (ENT) doctor. Keeping all follow-up appointments will help you avoid eye complications, chronic pain, or disability.  SEEK IMMEDIATE MEDICAL  CARE IF:   You have facial pain, pain around the eye area, or loss of feeling on one side of your face.  You have ear pain or ringing in your ear.  You have loss of taste.  Your pain is not relieved with prescribed medicines.   Your redness or swelling spreads.   You have more pain and swelling.  Your condition is worsening or has changed.   You have a feveror persistent symptoms for more than 2 3 days.  You have a fever and your symptoms suddenly get worse. MAKE SURE YOU:  Understand these instructions.  Will watch your condition.  Will get help right away if you are not doing well or get worse. Document Released: 12/15/2005 Document Revised: 09/08/2012 Document Reviewed: 07/29/2012 ExitCare Patient Information 2014 [employer].  

## 2014-01-26 NOTE — Progress Notes (Signed)
Subjective:    Patient ID: Brandon Hall, male    DOB: 1956/05/07, 58 y.o.   MRN: [id number]  HPI Patient in c/o rash that started over a week ago- on right shoulder and neck- itches and burns    Review of Systems  Constitutional: Negative.   HENT: Negative.   Respiratory: Negative.   Cardiovascular: Negative.   Musculoskeletal: Negative.   All other systems reviewed and are negative.       Objective:   Physical Exam  Constitutional: He appears well-developed and well-nourished.  Cardiovascular: Normal rate, regular rhythm and normal heart sounds.   Pulmonary/Chest: Effort normal and breath sounds normal.  Skin:  Erythematous patchy rash t=right shoulder with most lesions scabbed over.    BP 163/85  Pulse 64  Temp(Src) 96.8 F (36 C) (Oral)  Ht 5' 10.5" (1.791 m)  Wt 139 lb (63.05 kg)  BMI 19.66 kg/m2       Assessment & Plan:   1. Shingles rash    Hydrocortisone cream OTC Avoid scratching RTOprn Has had to kong to treat with valtrex  [name]-Margaret Daphine Deutscher, FNP

## 2014-01-28 ENCOUNTER — Other Ambulatory Visit: Payer: Self-pay | Admitting: Family Medicine

## 2014-01-31 ENCOUNTER — Ambulatory Visit: Payer: Medicare Other | Admitting: Family Medicine

## 2014-02-03 ENCOUNTER — Encounter: Payer: Self-pay | Admitting: Family Medicine

## 2014-02-03 ENCOUNTER — Ambulatory Visit (INDEPENDENT_AMBULATORY_CARE_PROVIDER_SITE_OTHER): Payer: Medicare HMO | Admitting: Family Medicine

## 2014-02-03 VITALS — BP 126/85 | HR 65 | Temp 97.5°F | Ht 70.5 in | Wt 138.6 lb

## 2014-02-03 DIAGNOSIS — H5440 Blindness, one eye, unspecified eye: Secondary | ICD-10-CM

## 2014-02-03 DIAGNOSIS — E785 Hyperlipidemia, unspecified: Secondary | ICD-10-CM

## 2014-02-03 DIAGNOSIS — R7402 Elevation of levels of lactic acid dehydrogenase (LDH): Secondary | ICD-10-CM

## 2014-02-03 DIAGNOSIS — C801 Malignant (primary) neoplasm, unspecified: Secondary | ICD-10-CM

## 2014-02-03 DIAGNOSIS — Z720 Tobacco use: Secondary | ICD-10-CM

## 2014-02-03 DIAGNOSIS — E0500 Thyrotoxicosis with diffuse goiter without thyrotoxic crisis or storm: Secondary | ICD-10-CM

## 2014-02-03 DIAGNOSIS — B029 Zoster without complications: Secondary | ICD-10-CM | POA: Insufficient documentation

## 2014-02-03 DIAGNOSIS — I10 Essential (primary) hypertension: Secondary | ICD-10-CM

## 2014-02-03 DIAGNOSIS — F17200 Nicotine dependence, unspecified, uncomplicated: Secondary | ICD-10-CM

## 2014-02-03 DIAGNOSIS — R748 Abnormal levels of other serum enzymes: Secondary | ICD-10-CM

## 2014-02-03 DIAGNOSIS — E039 Hypothyroidism, unspecified: Secondary | ICD-10-CM

## 2014-02-03 DIAGNOSIS — R740 Nonspecific elevation of levels of transaminase and lactic acid dehydrogenase [LDH]: Secondary | ICD-10-CM

## 2014-02-03 NOTE — Progress Notes (Signed)
Patient ID: Brandon Hall, male   DOB: 03-10-1956, 58 y.o.   MRN: [id number] SUBJECTIVE: CC: Chief Complaint  Patient presents with  . Follow-up    4 month follow up chronic problems  reck shingles right arm back     HPI: Patient is here for follow up of hypertension/hypothyroidism/: denies Headache;deniesChest Pain;denies weakness;denies Shortness of Breath or Orthopnea;denies Visual changes;denies palpitations;denies cough;denies pedal edema;denies symptoms of TIA or stroke; admits to Compliance with medications. denies Problems with medications. He has an appointment for a bone scan soon for the follow up on his prostate cancer. He has stopped smoking.  Past Medical History  Diagnosis Date  . Cancer     prostate cancer  . Hypertension   . Thyroid disease   . Graves disease    Past Surgical History  Procedure Laterality Date  . Colonoscopy  10/20/2011    Procedure: COLONOSCOPY;  Surgeon: Corbin Ade, MD;  Location: [facility];  Service: Endoscopy;  Laterality: N/A;  1:00 pm   History   Social History  . Marital Status: Married    Spouse Name: N/A    Number of Children: N/A  . Years of Education: N/A   Occupational History  . Not on file.   Social History Main Topics  . Smoking status: Former Smoker -- 1.00 packs/day for 40 years    Types: Cigarettes  . Smokeless tobacco: Former Neurosurgeon    Quit date: 09/28/2013  . Alcohol Use: No  . Drug Use: No  . Sexual Activity: Not on file   Other Topics Concern  . Not on file   Social History Narrative  . No narrative on file   Family History  Problem Relation Age of Onset  . Colon cancer Father    Current Outpatient Prescriptions on File Prior to Visit  Medication Sig Dispense Refill  . HYDROcodone-acetaminophen (NORCO/VICODIN) 5-325 MG per tablet Take 1 tablet by mouth every 4 (four) hours as needed.  20 tablet  0  . levothyroxine (SYNTHROID, LEVOTHROID) 50 MCG tablet TAKE 1 TABLET DAILY  30 tablet  8   No  current facility-administered medications on file prior to visit.   No Known Allergies Immunization History  Administered Date(s) Administered  . Influenza,inj,Quad PF,36+ Mos 09/30/2013   Prior to Admission medications   Medication Sig Start Date End Date Taking? Authorizing Provider  HYDROcodone-acetaminophen (NORCO/VICODIN) 5-325 MG per tablet Take 1 tablet by mouth every 4 (four) hours as needed. 12/01/13  Yes Deatra Canter, FNP  hydrocortisone cream 1 % Apply 1 application topically 2 (two) times daily.   Yes Historical Provider, MD  levothyroxine (SYNTHROID, LEVOTHROID) 50 MCG tablet TAKE 1 TABLET DAILY 01/28/14  Yes [name]-Margaret Daphine Deutscher, FNP     ROS: As above in the HPI. All other systems are stable or negative.  OBJECTIVE: APPEARANCE:  Patient in no acute distress.The patient appeared well nourished and normally developed. Acyanotic. Waist: VITAL SIGNS:BP 126/85  Pulse 65  Temp(Src) 97.5 F (36.4 C) (Oral)  Ht 5' 10.5" (1.791 m)  Wt 138 lb 9.6 oz (62.869 kg)  BMI 19.60 kg/m2 AAM  SKIN: warm and  Dry with residual shingles scars on the right shoulder and the right scapula area.  HEAD and Neck: without JVD, Head and scalp: normal Eyes:No scleral icterus.no change. 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 Nose: normal Throat: normal Neck & thyroid: normal  CHEST & LUNGS: Chest wall: normal Lungs: Clear  CVS: Reveals the PMI to be normally located. Regular rhythm, First and Second Heart sounds are normal,  absence of murmurs, rubs or gallops.  ABDOMEN:  Appearance: normal Benign, no organomegaly, no masses, no Abdominal Aortic enlargement. No Guarding , no rebound. No Bruits. Bowel sounds: normal  RECTAL: N/A GU: N/A  EXTREMETIES: nonedematous.  NEUROLOGIC: oriented to time,place and person; nonfocal. Results for orders placed in visit on 12/28/13  HEPATIC  FUNCTION PANEL      Result Value Range   Total Protein 7.6  6.0 - 8.5 g/dL   Albumin 5.1  3.5 - 5.5 g/dL   Total Bilirubin 0.5  0.0 - 1.2 mg/dL   Bilirubin, Direct 2.44  0.00 - 0.40 mg/dL   Alkaline Phosphatase 71  39 - 117 IU/L   AST 29  0 - 40 IU/L   ALT 15  0 - 44 IU/L    ASSESSMENT: Hypertension - Plan: CMP14+EGFR  Graves disease - Plan: TSH  Tobacco user - stopped smoking  Cancer  Blind right eye  Dyslipidemia - Plan: Lipid panel  Shingles rash  Hypothyroid  Abnormal transaminases The shingles outbreak is resolving with no neuralgia complication.  PLAN: Praised him for stopping smoking.  Healthy lifestyle  Discussed. Healthy eating and exercise.  Orders Placed This Encounter  Procedures  . CMP14+EGFR  . TSH  . Lipid panel   Meds ordered this encounter  Medications  . hydrocortisone cream 1 %    Sig: Apply 1 application topically 2 (two) times daily.  he can stop using the steroid ointment.  There are no discontinued medications. Return in about 3 months (around 05/03/2014) for Recheck medical problems.  Michae Grimley P. [place] Charon, M.D.

## 2014-02-04 ENCOUNTER — Other Ambulatory Visit: Payer: Self-pay | Admitting: Family Medicine

## 2014-02-04 DIAGNOSIS — E039 Hypothyroidism, unspecified: Secondary | ICD-10-CM

## 2014-02-04 LAB — CMP14+EGFR
ALT: 7 IU/L (ref 0–44)
AST: 19 IU/L (ref 0–40)
Albumin/Globulin Ratio: 1.7 (ref 1.1–2.5)
Albumin: 4.8 g/dL (ref 3.5–5.5)
Alkaline Phosphatase: 72 IU/L (ref 39–117)
BUN/Creatinine Ratio: 15 (ref 9–20)
BUN: 13 mg/dL (ref 6–24)
CO2: 25 mmol/L (ref 18–29)
Calcium: 9.4 mg/dL (ref 8.7–10.2)
Chloride: 98 mmol/L (ref 97–108)
Creatinine, Ser: 0.88 mg/dL (ref 0.76–1.27)
GFR calc Af Amer: 109 mL/min/{1.73_m2} (ref >59)
GFR calc non Af Amer: 95 mL/min/{1.73_m2} (ref >59)
Globulin, Total: 2.8 g/dL (ref 1.5–4.5)
Glucose: 86 mg/dL (ref 65–99)
Potassium: 4.2 mmol/L (ref 3.5–5.2)
Sodium: 140 mmol/L (ref 134–144)
Total Bilirubin: 0.6 mg/dL (ref 0.0–1.2)
Total Protein: 7.6 g/dL (ref 6.0–8.5)

## 2014-02-04 LAB — LIPID PANEL
Chol/HDL Ratio: 2.7 ratio units (ref 0.0–5.0)
Cholesterol, Total: 208 mg/dL — ABNORMAL HIGH (ref 100–199)
HDL: 78 mg/dL (ref >39)
LDL Calculated: 120 mg/dL — ABNORMAL HIGH (ref 0–99)
Triglycerides: 48 mg/dL (ref 0–149)
VLDL Cholesterol Cal: 10 mg/dL (ref 5–40)

## 2014-02-04 LAB — TSH: TSH: 27.54 u[IU]/mL — ABNORMAL HIGH (ref 0.450–4.500)

## 2014-02-04 MED ORDER — LEVOTHYROXINE SODIUM 75 MCG PO TABS: ORAL_TABLET | ORAL | Status: DC

## 2014-02-07 ENCOUNTER — Telehealth: Payer: Self-pay | Admitting: Family Medicine

## 2014-02-07 NOTE — Telephone Encounter (Signed)
Wife notified that his refill is ready for pick up at [facility] and has been ready since 02-04-14

## 2014-02-13 ENCOUNTER — Ambulatory Visit (HOSPITAL_COMMUNITY)
Admission: RE | Admit: 2014-02-13 | Discharge: 2014-02-13 | Disposition: A | Discharge status: Home / Self Care | Payer: Medicare HMO | Source: Ambulatory Visit | Attending: Urology | Admitting: Urology

## 2014-02-13 ENCOUNTER — Encounter (HOSPITAL_COMMUNITY)
Admission: RE | Admit: 2014-02-13 | Discharge: 2014-02-13 | Disposition: A | Discharge status: Home / Self Care | Payer: Medicare HMO | Source: Ambulatory Visit | Attending: Urology | Admitting: Urology

## 2014-02-13 DIAGNOSIS — C61 Malignant neoplasm of prostate: Secondary | ICD-10-CM | POA: Insufficient documentation

## 2014-02-13 MED ORDER — TECHNETIUM TC 99M MEDRONATE IV KIT
25.3000 | PACK | Freq: Once | INTRAVENOUS | Status: AC | PRN
Start: 2014-02-13 — End: 2014-02-13
  Administered 2014-02-13: 25.3 via INTRAVENOUS

## 2014-03-16 ENCOUNTER — Other Ambulatory Visit: Payer: Self-pay | Admitting: *Deleted

## 2014-03-16 DIAGNOSIS — E039 Hypothyroidism, unspecified: Secondary | ICD-10-CM

## 2014-03-16 MED ORDER — LEVOTHYROXINE SODIUM 75 MCG PO TABS: ORAL_TABLET | ORAL | Status: DC

## 2014-03-20 ENCOUNTER — Other Ambulatory Visit: Payer: Self-pay | Admitting: Urology

## 2014-03-28 ENCOUNTER — Encounter (HOSPITAL_BASED_OUTPATIENT_CLINIC_OR_DEPARTMENT_OTHER): Payer: Self-pay | Admitting: *Deleted

## 2014-03-30 ENCOUNTER — Encounter (HOSPITAL_BASED_OUTPATIENT_CLINIC_OR_DEPARTMENT_OTHER): Payer: Self-pay | Admitting: *Deleted

## 2014-03-30 NOTE — Progress Notes (Signed)
NPO AFTER MN. ARRIVE AT 8921. NEEDS ISTAT AND EKG. WILL TAKE SYNTHROID AM DOS W/ SIPS OF WATER.

## 2014-04-07 ENCOUNTER — Encounter: Payer: Self-pay | Admitting: *Deleted

## 2014-04-07 ENCOUNTER — Encounter (HOSPITAL_BASED_OUTPATIENT_CLINIC_OR_DEPARTMENT_OTHER): Payer: Medicare HMO | Admitting: Anesthesiology

## 2014-04-07 ENCOUNTER — Ambulatory Visit (HOSPITAL_BASED_OUTPATIENT_CLINIC_OR_DEPARTMENT_OTHER)
Admission: RE | Admit: 2014-04-07 | Discharge: 2014-04-07 | Disposition: A | Discharge status: Home / Self Care | Payer: Medicare HMO | Source: Ambulatory Visit | Attending: Urology | Admitting: Urology

## 2014-04-07 ENCOUNTER — Encounter (HOSPITAL_BASED_OUTPATIENT_CLINIC_OR_DEPARTMENT_OTHER): Payer: Self-pay

## 2014-04-07 ENCOUNTER — Encounter (HOSPITAL_BASED_OUTPATIENT_CLINIC_OR_DEPARTMENT_OTHER)
Admission: RE | Disposition: A | Discharge status: Home / Self Care | Payer: Self-pay | Source: Ambulatory Visit | Attending: Urology

## 2014-04-07 ENCOUNTER — Ambulatory Visit (HOSPITAL_BASED_OUTPATIENT_CLINIC_OR_DEPARTMENT_OTHER): Payer: Medicare HMO | Admitting: Anesthesiology

## 2014-04-07 DIAGNOSIS — E039 Hypothyroidism, unspecified: Secondary | ICD-10-CM | POA: Diagnosis not present

## 2014-04-07 DIAGNOSIS — I10 Essential (primary) hypertension: Secondary | ICD-10-CM | POA: Insufficient documentation

## 2014-04-07 DIAGNOSIS — Z79899 Other long term (current) drug therapy: Secondary | ICD-10-CM | POA: Diagnosis not present

## 2014-04-07 DIAGNOSIS — C61 Malignant neoplasm of prostate: Secondary | ICD-10-CM | POA: Diagnosis present

## 2014-04-07 DIAGNOSIS — Z8042 Family history of malignant neoplasm of prostate: Secondary | ICD-10-CM | POA: Diagnosis not present

## 2014-04-07 DIAGNOSIS — Z923 Personal history of irradiation: Secondary | ICD-10-CM | POA: Diagnosis not present

## 2014-04-07 DIAGNOSIS — Z87891 Personal history of nicotine dependence: Secondary | ICD-10-CM | POA: Insufficient documentation

## 2014-04-07 HISTORY — DX: Malignant neoplasm of prostate: C61

## 2014-04-07 HISTORY — PX: CRYOABLATION: SHX1415

## 2014-04-07 HISTORY — DX: Postprocedural hypothyroidism: E89.0

## 2014-04-07 HISTORY — DX: Blindness, one eye, unspecified eye: H54.40

## 2014-04-07 HISTORY — DX: Personal history of other infectious and parasitic diseases: Z86.19

## 2014-04-07 LAB — POCT I-STAT 4, (NA,K, GLUC, HGB,HCT)
Glucose, Bld: 89 mg/dL (ref 70–99)
HEMATOCRIT: 48 % (ref 39.0–52.0)
HEMOGLOBIN: 16.3 g/dL (ref 13.0–17.0)
Potassium: 4.5 mEq/L (ref 3.7–5.3)
SODIUM: 142 meq/L (ref 137–147)

## 2014-04-07 SURGERY — CRYOABLATION, PROSTATE
Anesthesia: General | Site: Prostate

## 2014-04-07 MED ORDER — PROPOFOL 10 MG/ML IV BOLUS
INTRAVENOUS | Status: DC | PRN
  Administered 2014-04-07: 150 mg via INTRAVENOUS

## 2014-04-07 MED ORDER — FENTANYL CITRATE 0.05 MG/ML IJ SOLN
INTRAMUSCULAR | Status: AC
  Filled 2014-04-07: qty 6

## 2014-04-07 MED ORDER — FENTANYL CITRATE 0.05 MG/ML IJ SOLN
INTRAMUSCULAR | Status: DC | PRN
  Administered 2014-04-07: 100 ug via INTRAVENOUS
  Administered 2014-04-07 (×2): 25 ug via INTRAVENOUS
  Administered 2014-04-07: 50 ug via INTRAVENOUS

## 2014-04-07 MED ORDER — MIDAZOLAM HCL 2 MG/2ML IJ SOLN
INTRAMUSCULAR | Status: AC
  Filled 2014-04-07: qty 2

## 2014-04-07 MED ORDER — CIPROFLOXACIN IN D5W 400 MG/200ML IV SOLN
400.0000 mg | INTRAVENOUS | Status: AC
  Administered 2014-04-07: 400 mg via INTRAVENOUS
  Filled 2014-04-07: qty 200

## 2014-04-07 MED ORDER — ONDANSETRON HCL 4 MG/2ML IJ SOLN
INTRAMUSCULAR | Status: DC | PRN
  Administered 2014-04-07: 4 mg via INTRAVENOUS

## 2014-04-07 MED ORDER — LACTATED RINGERS IV SOLN
INTRAVENOUS | Status: DC
  Administered 2014-04-07 (×2): via INTRAVENOUS
  Filled 2014-04-07: qty 1000

## 2014-04-07 MED ORDER — DEXAMETHASONE SODIUM PHOSPHATE 4 MG/ML IJ SOLN
INTRAMUSCULAR | Status: DC | PRN
  Administered 2014-04-07: 8 mg via INTRAVENOUS

## 2014-04-07 MED ORDER — BELLADONNA ALKALOIDS-OPIUM 16.2-60 MG RE SUPP
RECTAL | Status: DC | PRN
  Administered 2014-04-07: 1 via RECTAL

## 2014-04-07 MED ORDER — BELLADONNA ALKALOIDS-OPIUM 16.2-60 MG RE SUPP
RECTAL | Status: AC
  Filled 2014-04-07: qty 1

## 2014-04-07 MED ORDER — STERILE WATER FOR IRRIGATION IR SOLN
Status: DC | PRN
  Administered 2014-04-07: 500 mL

## 2014-04-07 MED ORDER — LIDOCAINE HCL (CARDIAC) 20 MG/ML IV SOLN
INTRAVENOUS | Status: DC | PRN
  Administered 2014-04-07: 80 mg via INTRAVENOUS

## 2014-04-07 MED ORDER — STERILE WATER FOR IRRIGATION IR SOLN
Status: DC | PRN
  Administered 2014-04-07: 6000 mL

## 2014-04-07 MED ORDER — CIPROFLOXACIN HCL 500 MG PO TABS: 500.0000 mg | ORAL_TABLET | Freq: Two times a day (BID) | ORAL | Status: DC

## 2014-04-07 SURGICAL SUPPLY — 35 items
BAG URINE DRAINAGE (UROLOGICAL SUPPLIES) ×3 IMPLANT
BAG URINE LEG 19OZ MD ST LTX (BAG) ×3 IMPLANT
BNDG CONFORM 3 STRL LF (GAUZE/BANDAGES/DRESSINGS) IMPLANT
CANISTER SUCTION 2500CC (MISCELLANEOUS) IMPLANT
CATH FOLEY 2WAY SLVR  5CC 18FR (CATHETERS) ×2
CATH FOLEY 2WAY SLVR 5CC 18FR (CATHETERS) ×1 IMPLANT
CHARGE TECH PROCEDURE ONCURA (LABOR (TRAVEL & OVERTIME)) ×3 IMPLANT
CLOTH BEACON ORANGE TIMEOUT ST (SAFETY) ×3 IMPLANT
COVER MAYO STAND STRL (DRAPES) ×3 IMPLANT
DRAPE CAMERA CLOSED 9X96 (DRAPES) IMPLANT
DRAPE INCISE IOBAN 66X45 STRL (DRAPES) IMPLANT
DRSG TEGADERM 4X4.75 (GAUZE/BANDAGES/DRESSINGS) ×3 IMPLANT
DRSG TEGADERM 8X12 (GAUZE/BANDAGES/DRESSINGS) ×3 IMPLANT
GAS ARGON HIGH PRESSURE (MEDICAL GASES) ×3 IMPLANT
GAS HELIUM HIGH PRESSURE (MEDICAL GASES) ×3 IMPLANT
GLOVE BIO SURGEON STRL SZ7 (GLOVE) ×3 IMPLANT
GLOVE BIOGEL PI IND STRL 7.5 (GLOVE) ×2 IMPLANT
GLOVE BIOGEL PI INDICATOR 7.5 (GLOVE) ×4
GLOVE SURG SS PI 7.5 STRL IVOR (GLOVE) ×9 IMPLANT
GOWN STRL REUS W/TWL LRG LVL3 (GOWN DISPOSABLE) ×3 IMPLANT
GOWN STRL REUS W/TWL XL LVL3 (GOWN DISPOSABLE) ×3 IMPLANT
GUIDEWIRE SUPER STIFF (WIRE) ×3 IMPLANT
HOLDER FOLEY CATH W/STRAP (MISCELLANEOUS) IMPLANT
KIT PROSTATE PRESICE I (KITS) ×3 IMPLANT
NEEDLE SPNL 18GX3.5 QUINCKE PK (NEEDLE) IMPLANT
PACK CYSTOSCOPY (CUSTOM PROCEDURE TRAY) ×3 IMPLANT
PLUG CATH AND CAP STER (CATHETERS) ×3 IMPLANT
SPONGE GAUZE 4X4 12PLY (GAUZE/BANDAGES/DRESSINGS) IMPLANT
SPONGE GAUZE 4X4 12PLY STER LF (GAUZE/BANDAGES/DRESSINGS) ×3 IMPLANT
SYRINGE 10CC LL (SYRINGE) ×3 IMPLANT
SYRINGE IRR TOOMEY STRL 70CC (SYRINGE) IMPLANT
TRAY DSU PREP LF (CUSTOM PROCEDURE TRAY) ×3 IMPLANT
UNDERPAD 30X30 INCONTINENT (UNDERPADS AND DIAPERS) ×3 IMPLANT
WATER STERILE IRR 3000ML UROMA (IV SOLUTION) ×6 IMPLANT
WATER STERILE IRR 500ML POUR (IV SOLUTION) ×3 IMPLANT

## 2014-04-07 NOTE — Anesthesia Procedure Notes (Signed)
Procedure Name: LMA Insertion Date/Time: 04/07/2014 11:08 AM Performed by: Denna Haggard D Pre-anesthesia Checklist: Patient identified, Emergency Drugs available, Suction available and Patient being monitored Patient Re-evaluated:Patient Re-evaluated prior to inductionOxygen Delivery Method: Circle System Utilized Preoxygenation: Pre-oxygenation with 100% oxygen Intubation Type: IV induction Ventilation: Mask ventilation without difficulty LMA: LMA inserted LMA Size: 4.0 Number of attempts: 1 Airway Equipment and Method: bite block Placement Confirmation: positive ETCO2 Tube secured with: Tape Dental Injury: Teeth and Oropharynx as per pre-operative assessment

## 2014-04-07 NOTE — Anesthesia Preprocedure Evaluation (Addendum)
Anesthesia Evaluation  Patient identified by MRN, date of birth, ID band Patient awake    Reviewed: Allergy & Precautions, H&P , NPO status , Patient's Chart, lab work & pertinent test results  Airway Mallampati: II TM Distance: >3 FB Neck ROM: Full    Dental  (+) Edentulous Upper, Edentulous Lower   Pulmonary former smoker,    Pulmonary exam normal   - rales (Blind right eye)    Cardiovascular hypertension, Rhythm:Regular Rate:Normal     Neuro/Psych negative neurological ROS  negative psych ROS   GI/Hepatic negative GI ROS, Neg liver ROS,   Endo/Other  Hypothyroidism   Renal/GU negative Renal ROS  negative genitourinary   Musculoskeletal negative musculoskeletal ROS (+)   Abdominal   Peds  Hematology negative hematology ROS (+)   Anesthesia Other Findings   Reproductive/Obstetrics                          Anesthesia Physical Anesthesia Plan  ASA: II  Anesthesia Plan: General   Post-op Pain Management:    Induction: Intravenous  Airway Management Planned: Oral ETT  Additional Equipment:   Intra-op Plan:   Post-operative Plan: Extubation in OR  Informed Consent: I have reviewed the patients History and Physical, chart, labs and discussed the procedure including the risks, benefits and alternatives for the proposed anesthesia with the patient or authorized representative who has indicated his/her understanding and acceptance.   Dental advisory given  Plan Discussed with: CRNA  Anesthesia Plan Comments:         Anesthesia Quick Evaluation

## 2014-04-07 NOTE — Anesthesia Postprocedure Evaluation (Signed)
  Anesthesia Post-op Note  Patient: Brandon Hall  Procedure(s) Performed: Procedure(s) (LRB): SALVAGE CRYO ABLATION PROSTATE (N/A)  Patient Location: PACU  Anesthesia Type: General  Level of Consciousness: awake and alert   Airway and Oxygen Therapy: Patient Spontanous Breathing  Post-op Pain: mild  Post-op Assessment: Post-op Vital signs reviewed, Patient's Cardiovascular Status Stable, Respiratory Function Stable, Patent Airway and No signs of Nausea or vomiting  Last Vitals:  Filed Vitals:   04/07/14 1307  BP: 177/95  Pulse: 61  Temp:   Resp: 14    Post-op Vital Signs: stable   Complications: No apparent anesthesia complications

## 2014-04-07 NOTE — Discharge Instructions (Signed)
Home with Foley to leg bag  INSTRUCTIONS AFTER CRYOABLATION OF THE PROSTATE  Normal Findings After Cryoablation:  You may experience a number of symptoms after the cryoablation which occur in some patients.   There is no cause for alarm should this happen.  These symptoms include: -Blood in the urine.  When you are discharged after your surgery, your urine will have some blood in it and may appear red.   This occurs to some degree in all patients after cryoablation and is not cause for alarm. Your urine should clear approximately 24 hours after the procedure, but may persist for quite a while longer. -Scrotal and penile swelling and bruising.  This occurs about 2-3 days after the cryoablation and is caused by tissue swelling which temporarily blocks the drainage of lymph.  This is painless and resolves in less than one week.  Ice packs and lying down for short periods of time during the day will improve the swelling. -Small amounts of bloody discharge from the end of your penis.  This can occur for up to 6 weeks after the procedure and is not cause for alarm.  It is due to some discharge from the urethra in the area of the prostate. -Some numbness in the head of the penis. Occasionally, when a large amount of freezing has been performed during the procedure, the nerve which supplies sensation to one or both sides of the head of the penis may be affected. The sensation returns after a number of months.  Call your doctor at [phone] if any of the following occur:   -If you have any pain, fever, or chills.  -If your foley catheter or suprapubic tube is not draining urine.  -If there is decreasing urinary stream.  This may indicate sloughing of some dead tissue in the area of the prostate near the opening to the bladder.  It may clear on its own but,  if the problem becomes severe, it may require removal of the dead tissue through a cystoscope.  -If you have diarrhea after urination or  foul-smelling urine. These symptoms may indicate an urethrorectal fistula, which is a hole    between the bladder channel and the rectum.  This should be investigated by your doctor.  -If you have any questions or problems    Post Anesthesia Home Care Instructions  Activity: Get plenty of rest for the remainder of the day. A responsible adult should stay with you for 24 hours following the procedure.  For the next 24 hours, DO NOT: -Drive a car -Paediatric nurse -Drink alcoholic beverages -Take any medication unless instructed by your physician -Make any legal decisions or sign important papers.  Meals: Start with liquid foods such as gelatin or soup. Progress to regular foods as tolerated. Avoid greasy, spicy, heavy foods. If nausea and/or vomiting occur, drink only clear liquids until the nausea and/or vomiting subsides. Call your physician if vomiting continues.  Special Instructions/Symptoms: Your throat may feel dry or sore from the anesthesia or the breathing tube placed in your throat during surgery. If this causes discomfort, gargle with warm salt water. The discomfort should disappear within 24 hours.

## 2014-04-07 NOTE — Transfer of Care (Signed)
Immediate Anesthesia Transfer of Care Note  Patient: Brandon Hall  Procedure(s) Performed: Procedure(s) (LRB): SALVAGE CRYO ABLATION PROSTATE (N/A)  Patient Location: PACU  Anesthesia Type: General  Level of Consciousness: awake, oriented, sedated and patient cooperative  Airway & Oxygen Therapy: Patient Spontanous Breathing and Patient connected to face mask oxygen  Post-op Assessment: Report given to PACU RN and Post -op Vital signs reviewed and stable  Post vital signs: Reviewed and stable  Complications: No apparent anesthesia complications

## 2014-04-07 NOTE — Op Note (Addendum)
Brandon Hall is a 58 y.o.   04/07/2014  General  Preop diagnosis: Adenocarcinoma of prostate Gleason 4+3 Stage T2b  Postop diagnosis: Same  Procedure done: Salvaage cryoablation of prostate, cystoscopy  Surgeon: Charlene Brooke. [name]  Anesthesia: General  Indication: Patient is a 58 years old male who had external beam radiation therapy in 2006 for adenocarcinoma of prostate. His primary care physician requested a PSA that was elevated at 10. Repeat PSA at our office on 11/08/2013 was 12.16. Prostate biopsy revealed Gleason 4+3, 3+4 and 3+3 in 8 out of 12 cores. Bone scan showed no evidence of bony metastasis. CT scan abdomen and pelvis showed no evidence of pelvic or retroperitoneal adenopathy. The patient was advised then to have salvage cryoablation. He was referred to me by Dr. Louis Meckel for the procedure. And he is scheduled to have done today.  Procedure: The patient was identified by his wrist band and proper timeout was taken.  A Foley catheter was inserted in the bladder. Prostate ultrasound was done. 2 needles were placed on the anterior prostate. 3 needles were placed on the posterior prostate. A temperature needle was placed in the Denonvilliers fascia and an other one was placed at the level of the external sphincter.  A flexible cystoscope was then inserted in the bladder. The urethra appears normal. There is moderate prostatic hypertrophy. The bladder mucosa is normal. There is no stone or tumor in the bladder. The ureteral orifices are in normal position and shape. With the cystoscope in retroflexion there was no evidence of penetration of the needle at the level of the bladder neck. A super stiff wire was passed through the cystoscope in the bladder. The urethral warmer catheter was then passed over the wire in the bladder. The wire was then removed.  At this point the freeze cycle was then started on the anterior needles and when the ice ball was close to the posterior needles the  second row was started. The free cycle lasted about 10 minutes. Then the prostate was thawed for 20 minutes. The second freeze cycle was then started and lasted 9 minutes. The second thaw cycle lasted 15 minutes. 20 minutes after the procedure the urethral warmer catheter was removed. A #18 Foley catheter was then inserted in the bladder.  The patient tolerated the procedure well and left the OR in satisfactory condition to postanesthesia care unit

## 2014-04-07 NOTE — H&P (Signed)
Past History:  [name] is referred by Dr Brandon Hall for consultation regarding salvage cryoablation of prostate. [name] apparently had external beam radiation therapy in 2006 for adenocarcinoma of prostate. He states that he was told that everything was fine. Dr Brandon Hall, his new PCP requested PSA that was elevated: 10. Repeat PSA at our office on 11/08/13 was 12.16. Prostate biopsy revealed Gleason 4+3 in 20 -60% of 4 cores , 3+4 in 10-80% of 3 other cores and 3+3 in 10% of another core. CT scan abdomen and pelvis showed no evidence of metastatic disease. He denies any bony pains. He does not have any voiding symptoms. Will get bone scan to rule out bony metastasis. I went over the treatment options with the patient and his wife: salvage radical prostatectomy versus salvage cryoablation. The risks, benefits of each option were reviewed with them. They state that they are not interested in the surgical option. The risks of cryoablation include but are not limited to urethral sloughing, erectile impotence, prostatic rectal fistula. They understand and wish to proceed. Will call them with bone scan results and if it is negative as I suspect it will be, will schedule him for the procedure.   Past Medical History Problems  1. History of hypertension (V12.59)  Surgical History Problems  1. History of No Surgical Problems  Current Meds 1. Levothyroxine Sodium 50 MCG Oral Tablet;  Therapy: (Recorded:11Nov2014) to Recorded  Allergies Medication  1. No Known Drug Allergies  Family History Problems  1. Family history of prostate cancer (Z61.09) : Father  Social History Problems  1. Denied: History of Alcohol use 2. Caffeine use (V49.89) 3. Death in the family, father   cancer 4. Death in the family, mother 74. Disabled 6. Former smoker [place])   smoked 1ppd for 14 years quit 1 month ago 2014 7. Married  Review of Systems Genitourinary, constitutional, skin, eye, otolaryngeal,  hematologic/lymphatic, cardiovascular, pulmonary, endocrine, musculoskeletal, gastrointestinal, neurological and psychiatric system(s) were reviewed and pertinent findings if present are noted.    Physical Exam Constitutional: Well nourished and well developed . No acute distress.  ENT:. The ears and nose are normal in appearance.  Neck: The appearance of the neck is normal and no neck mass is present.  Pulmonary: No respiratory distress and normal respiratory rhythm and effort.  Cardiovascular: Heart rate and rhythm are normal . No peripheral edema.  Abdomen: The abdomen is soft and nontender. No masses are palpated. No CVA tenderness. No hernias are palpable. No hepatosplenomegaly noted.  Rectal: Rectal exam demonstrates normal sphincter tone, no tenderness and no masses. The prostate has no nodularity and is not tender. The left seminal vesicle is nonpalpable. The right seminal vesicle is nonpalpable. The perineum is normal on inspection.  Genitourinary: Examination of the penis demonstrates no discharge, no masses, no lesions and a normal meatus. The scrotum is without lesions. The right epididymis is palpably normal and non-tender. The left epididymis is palpably normal and non-tender. The right testis is non-tender and without masses. The left testis is non-tender and without masses.  Lymphatics: The femoral and inguinal nodes are not enlarged or tender.  Skin: Normal skin turgor, no visible rash and no visible skin lesions.  Neuro/Psych:. Mood and affect are appropriate.    Results/Data Urine [Data Includes: Last 1 Day]   [id number]  COLOR YELLOW   APPEARANCE CLEAR   SPECIFIC GRAVITY 1.010   pH 6.0   GLUCOSE NEG mg/dL  BILIRUBIN NEG   KETONE NEG mg/dL  BLOOD  TRACE   PROTEIN NEG mg/dL  UROBILINOGEN 0.2 mg/dL  NITRITE NEG   LEUKOCYTE ESTERASE NEG   SQUAMOUS EPITHELIAL/HPF NONE SEEN   WBC NONE SEEN WBC/hpf  RBC 0-2 RBC/hpf  BACTERIA NONE SEEN   CRYSTALS NONE SEEN   CASTS NONE  SEEN    Assessment Assessed  1. Adenocarcinoma of prostate (185)  Plan Adenocarcinoma of prostate  1. Follow-up Schedule Surgery Office  Follow-up  Status: Complete  Done: [id number] Health Maintenance  2. UA With REFLEX; [Do Not Release]; Status:Complete;   Done: [id number] 10:25AM  Salvage cryoablation prostate.

## 2014-04-10 ENCOUNTER — Encounter (HOSPITAL_BASED_OUTPATIENT_CLINIC_OR_DEPARTMENT_OTHER): Payer: Self-pay | Admitting: Urology

## 2014-04-20 ENCOUNTER — Telehealth: Payer: Self-pay | Admitting: Family Medicine

## 2014-04-23 ENCOUNTER — Other Ambulatory Visit: Payer: Self-pay | Admitting: Family Medicine

## 2014-04-23 DIAGNOSIS — C61 Malignant neoplasm of prostate: Secondary | ICD-10-CM

## 2014-04-23 NOTE — Telephone Encounter (Signed)
Referral done in [facility].

## 2014-05-01 ENCOUNTER — Encounter: Payer: Self-pay | Admitting: Family Medicine

## 2014-05-01 ENCOUNTER — Ambulatory Visit (INDEPENDENT_AMBULATORY_CARE_PROVIDER_SITE_OTHER): Payer: Medicare HMO | Admitting: Family Medicine

## 2014-05-01 ENCOUNTER — Ambulatory Visit (INDEPENDENT_AMBULATORY_CARE_PROVIDER_SITE_OTHER): Payer: Medicare HMO

## 2014-05-01 VITALS — BP 130/80 | HR 70 | Temp 96.7°F | Ht 70.0 in | Wt 140.4 lb

## 2014-05-01 DIAGNOSIS — C801 Malignant (primary) neoplasm, unspecified: Secondary | ICD-10-CM

## 2014-05-01 DIAGNOSIS — R221 Localized swelling, mass and lump, neck: Secondary | ICD-10-CM

## 2014-05-01 DIAGNOSIS — R220 Localized swelling, mass and lump, head: Secondary | ICD-10-CM

## 2014-05-01 DIAGNOSIS — E785 Hyperlipidemia, unspecified: Secondary | ICD-10-CM

## 2014-05-01 DIAGNOSIS — E039 Hypothyroidism, unspecified: Secondary | ICD-10-CM

## 2014-05-01 DIAGNOSIS — E0500 Thyrotoxicosis with diffuse goiter without thyrotoxic crisis or storm: Secondary | ICD-10-CM

## 2014-05-01 DIAGNOSIS — B029 Zoster without complications: Secondary | ICD-10-CM

## 2014-05-01 DIAGNOSIS — H5440 Blindness, one eye, unspecified eye: Secondary | ICD-10-CM

## 2014-05-01 DIAGNOSIS — I10 Essential (primary) hypertension: Secondary | ICD-10-CM

## 2014-05-01 LAB — POCT CBC
Granulocyte percent: 56.5 %G (ref 37–80)
HCT, POC: 45.2 % (ref 43.5–53.7)
Hemoglobin: 14.1 g/dL (ref 14.1–18.1)
Lymph, poc: 2 (ref 0.6–3.4)
MCH, POC: 26 pg — AB (ref 27–31.2)
MCHC: 31.2 g/dL — AB (ref 31.8–35.4)
MCV: 83.3 fL (ref 80–97)
MPV: 8.2 fL (ref 0–99.8)
POC Granulocyte: 2.8 (ref 2–6.9)
POC LYMPH PERCENT: 40.1 %L (ref 10–50)
Platelet Count, POC: 231 10*3/uL (ref 142–424)
RBC: 5.4 M/uL (ref 4.69–6.13)
RDW, POC: 15.5 %
WBC: 4.9 10*3/uL (ref 4.6–10.2)

## 2014-05-01 NOTE — Progress Notes (Signed)
Patient ID: Brandon Hall, male   DOB: 1956-09-13, 58 y.o.   MRN: [id number] SUBJECTIVE: CC: Chief Complaint  Patient presents with  . Hypothyroidism    3 month recheck    HPI: Neck mass came out in the last 2 weeks. Painless, no problems swallowing or breathing.  Hypothyroid: dose was increased.  Patient is here for follow up of hypertension: denies Headache;deniesChest Pain;denies weakness;denies Shortness of Breath or Orthopnea;denies Visual changes;denies palpitations;denies cough;denies pedal edema;denies symptoms of TIA or stroke; admits to Compliance with medications. denies Problems with medications.   Patient is here for follow up of hyperlipidemia: denies Headache;denies Chest Pain;denies weakness;denies Shortness of Breath and orthopnea;denies Visual changes;denies palpitations;denies cough;denies pedal edema;denies symptoms of TIA or stroke;deniesClaudication symptoms. admits to Compliance with medications; denies Problems with medications.   Past Medical History  Diagnosis Date  . Graves disease   . Hypothyroidism, postradioiodine therapy   . History of shingles     RASH  02-03-2014  PER PCP RESOLVING  . Prostate cancer     DX  SEVERAL  YRS   AGO  WITH RADIATION THERAPY  . Cataract, secondary obscuring vision     LEFT  . Blindness of right eye   . Hypertension     NO MEDS ,  MONITORED BY PCP   Past Surgical History  Procedure Laterality Date  . Colonoscopy  10/20/2011    Procedure: COLONOSCOPY;  Surgeon: Corbin Ade, MD;  Location: [facility];  Service: Endoscopy;  Laterality: N/A;  1:00 pm  . Cryoablation N/A 04/07/2014    Procedure: SALVAGE CRYO ABLATION PROSTATE;  Surgeon: Su Grand, MD;  Location: [facility];  Service: Urology;  Laterality: N/A;   History   Social History  . Marital Status: Married    Spouse Name: N/A    Number of Children: N/A  . Years of Education: N/A   Occupational History  . Not on file.   Social History  Main Topics  . Smoking status: Former Smoker -- 1.00 packs/day for 40 years    Types: Cigarettes    Quit date: 09/29/2013  . Smokeless tobacco: Never Used  . Alcohol Use: No  . Drug Use: No  . Sexual Activity: Not on file   Other Topics Concern  . Not on file   Social History Narrative  . No narrative on file   Family History  Problem Relation Age of Onset  . Colon cancer Father    Current Outpatient Prescriptions on File Prior to Visit  Medication Sig Dispense Refill  . hydrocortisone cream 1 % Apply 1 application topically 2 (two) times daily.      Marland Kitchen levothyroxine (SYNTHROID, LEVOTHROID) 75 MCG tablet Take 75 mcg by mouth daily before breakfast.      . HYDROcodone-acetaminophen (NORCO/VICODIN) 5-325 MG per tablet Take 1 tablet by mouth every 4 (four) hours as needed.  20 tablet  0   No current facility-administered medications on file prior to visit.   No Known Allergies Immunization History  Administered Date(s) Administered  . Influenza,inj,Quad PF,36+ Mos 09/30/2013   Prior to Admission medications   Medication Sig Start Date End Date Taking? Authorizing Provider  hydrocortisone cream 1 % Apply 1 application topically 2 (two) times daily.   Yes Historical Provider, MD  levothyroxine (SYNTHROID, LEVOTHROID) 75 MCG tablet Take 75 mcg by mouth daily before breakfast. 03/16/14  Yes Ileana Ladd, MD  HYDROcodone-acetaminophen (NORCO/VICODIN) 5-325 MG per tablet Take 1 tablet by mouth every 4 (four)  hours as needed. 12/01/13   Deatra Canter, FNP     ROS: As above in the HPI. All other systems are stable or negative.  OBJECTIVE: APPEARANCE:  Patient in no acute distress.The patient appeared well nourished and normally developed. Acyanotic. Waist: VITAL SIGNS:BP 130/80  Pulse 70  Temp(Src) 96.7 F (35.9 C) (Oral)  Ht 5\' 10"  (1.778 m)  Wt 140 lb 6.4 oz (63.685 kg)  BMI 20.15 kg/m2 Slim built AAM  SKIN: warm and  Dry without overt rashes, tattoos and  scars  HEAD and Neck: without JVD, Head and scalp: normal Eyes:No scleral icterus.opaque right cornea. Blind right eye. Ears: Auricle normal, canal normal, Tympanic membranes normal, insufflation normal. Nose: normal Throat: normal Neck:soft mass 6x3 mm ovoid posterior neck mass left side.smooth.nontender. thyroid: normal  CHEST & LUNGS: Chest wall: normal Lungs: Clear  CVS: Reveals the PMI to be normally located. Regular rhythm, First and Second Heart sounds are normal,  absence of murmurs, rubs or gallops. Peripheral vasculature: Radial pulses: normal Dorsal pedis pulses: normal Posterior pulses: normal  ABDOMEN:  Appearance: normal Benign, no organomegaly, no masses, no Abdominal Aortic enlargement. No Guarding , no rebound. No Bruits. Bowel sounds: normal  RECTAL: N/A GU: N/A  EXTREMETIES: nonedematous.  MUSCULOSKELETAL:  Spine: normal Joints: intact NEUROLOGIC: oriented to time,place and person; nonfocal.  Results for orders placed in visit on 05/01/14  POCT CBC      Result Value Ref Range   WBC 4.9  4.6 - 10.2 K/uL   Lymph, poc 2.0  0.6 - 3.4   POC LYMPH PERCENT 40.1  10 - 50 %L   POC Granulocyte 2.8  2 - 6.9   Granulocyte percent 56.5  37 - 80 %G   RBC 5.4  4.69 - 6.13 M/uL   Hemoglobin 14.1  14.1 - 18.1 g/dL   HCT, POC 54.0  98.1 - 53.7 %   MCV 83.3  80 - 97 fL   MCH, POC 26.0 (*) 27 - 31.2 pg   MCHC 31.2 (*) 31.8 - 35.4 g/dL   RDW, POC 19.1     Platelet Count, POC 231.0  142 - 424 K/uL   MPV 8.2  0 - 99.8 fL    ASSESSMENT:  Shingles rash  Hypertension - Plan: CMP14+EGFR  Dyslipidemia - Plan: Lipid panel  Hypothyroid - Plan: TSH  Graves disease  Blind right eye  Cancer  Neck mass - Plan: POCT CBC, DG Neck Soft Tissue, CT Soft Tissue Neck W Contrast  PLAN:  Orders Placed This Encounter  Procedures  . DG Neck Soft Tissue    Standing Status: Future     Number of Occurrences: 1     Standing Expiration Date: 07/02/2015    Order  Specific Question:  Reason for Exam (SYMPTOM  OR DIAGNOSIS REQUIRED)    Answer:  neck mass    Order Specific Question:  Preferred imaging location?    Answer:  Internal  . CT Soft Tissue Neck W Contrast    Standing Status: Future     Number of Occurrences:      Standing Expiration Date: 08/02/2015    Order Specific Question:  Reason for Exam (SYMPTOM  OR DIAGNOSIS REQUIRED)    Answer:  large 6 x 3 mm soft tissue mass came up in the last 2 weeks. has h/o prostate cancer presently treated with abnormal bone  scan    Order Specific Question:  Preferred imaging location?    Answer:  [facility]  .  CMP14+EGFR  . TSH  . Lipid panel  . POCT CBC  [facility] reading (PRIMARY) by  Dr.Jermon Chalfant: advanced degenerative changes.soft tissue mass.                                 Awaiting CT of the neck  No orders of the defined types were placed in this encounter.   Medications Discontinued During This Encounter  Medication Reason  . ciprofloxacin (CIPRO) 500 MG tablet Completed Course   Return in about 2 weeks (around 05/15/2014) for follow up on the neck mass and CT.with a new provider here.  Linnette Panella P. [place] Charon, M.D.

## 2014-05-02 LAB — CMP14+EGFR
ALT: 10 IU/L (ref 0–44)
AST: 16 IU/L (ref 0–40)
Albumin/Globulin Ratio: 1.6 (ref 1.1–2.5)
Albumin: 4.4 g/dL (ref 3.5–5.5)
Alkaline Phosphatase: 75 IU/L (ref 39–117)
BUN/Creatinine Ratio: 13 (ref 9–20)
BUN: 12 mg/dL (ref 6–24)
CO2: 25 mmol/L (ref 18–29)
Calcium: 9.2 mg/dL (ref 8.7–10.2)
Chloride: 101 mmol/L (ref 97–108)
Creatinine, Ser: 0.91 mg/dL (ref 0.76–1.27)
GFR calc Af Amer: 106 mL/min/{1.73_m2} (ref >59)
GFR calc non Af Amer: 92 mL/min/{1.73_m2} (ref >59)
Globulin, Total: 2.8 g/dL (ref 1.5–4.5)
Glucose: 80 mg/dL (ref 65–99)
Potassium: 4.5 mmol/L (ref 3.5–5.2)
Sodium: 140 mmol/L (ref 134–144)
Total Bilirubin: 0.4 mg/dL (ref 0.0–1.2)
Total Protein: 7.2 g/dL (ref 6.0–8.5)

## 2014-05-02 LAB — LIPID PANEL
Chol/HDL Ratio: 3.1 ratio units (ref 0.0–5.0)
Cholesterol, Total: 193 mg/dL (ref 100–199)
HDL: 62 mg/dL (ref >39)
LDL Calculated: 121 mg/dL — ABNORMAL HIGH (ref 0–99)
Triglycerides: 49 mg/dL (ref 0–149)
VLDL Cholesterol Cal: 10 mg/dL (ref 5–40)

## 2014-05-02 LAB — TSH: TSH: 15.83 u[IU]/mL — ABNORMAL HIGH (ref 0.450–4.500)

## 2014-05-04 ENCOUNTER — Other Ambulatory Visit: Payer: Self-pay | Admitting: Family Medicine

## 2014-05-04 MED ORDER — LEVOTHYROXINE SODIUM 88 MCG PO TABS: 88.0000 ug | ORAL_TABLET | Freq: Every day | ORAL | Status: DC

## 2014-05-05 ENCOUNTER — Ambulatory Visit (HOSPITAL_COMMUNITY)
Admission: RE | Admit: 2014-05-05 | Discharge: 2014-05-05 | Disposition: A | Discharge status: Home / Self Care | Payer: Medicare HMO | Source: Ambulatory Visit | Attending: Family Medicine | Admitting: Family Medicine

## 2014-05-05 ENCOUNTER — Ambulatory Visit: Payer: Medicare HMO | Admitting: Family Medicine

## 2014-05-05 ENCOUNTER — Other Ambulatory Visit: Payer: Self-pay | Admitting: *Deleted

## 2014-05-05 DIAGNOSIS — R599 Enlarged lymph nodes, unspecified: Secondary | ICD-10-CM | POA: Insufficient documentation

## 2014-05-05 DIAGNOSIS — R221 Localized swelling, mass and lump, neck: Secondary | ICD-10-CM

## 2014-05-05 DIAGNOSIS — R220 Localized swelling, mass and lump, head: Secondary | ICD-10-CM | POA: Insufficient documentation

## 2014-05-05 DIAGNOSIS — C61 Malignant neoplasm of prostate: Secondary | ICD-10-CM | POA: Insufficient documentation

## 2014-05-05 MED ORDER — IOHEXOL 300 MG/ML  SOLN
80.0000 mL | Freq: Once | INTRAMUSCULAR | Status: AC | PRN
  Administered 2014-05-05: 80 mL via INTRAVENOUS

## 2014-05-05 NOTE — Progress Notes (Signed)
Brandon Hall received call from Dr Nevada Crane radiologist with GI regarding abnormal neck CT Verbal orders entered according to radiologist recommendations

## 2014-05-10 ENCOUNTER — Other Ambulatory Visit: Payer: Self-pay | Admitting: Family Medicine

## 2014-05-11 ENCOUNTER — Telehealth: Payer: Self-pay | Admitting: Family Medicine

## 2014-05-14 ENCOUNTER — Other Ambulatory Visit: Payer: Medicare HMO

## 2014-05-15 ENCOUNTER — Encounter: Payer: Self-pay | Admitting: Family

## 2014-05-15 ENCOUNTER — Ambulatory Visit (INDEPENDENT_AMBULATORY_CARE_PROVIDER_SITE_OTHER): Payer: Medicare HMO | Admitting: Family

## 2014-05-15 VITALS — BP 160/93 | HR 70 | Temp 97.7°F | Ht 70.0 in | Wt 141.6 lb

## 2014-05-15 DIAGNOSIS — IMO0001 Reserved for inherently not codable concepts without codable children: Secondary | ICD-10-CM

## 2014-05-15 DIAGNOSIS — R030 Elevated blood-pressure reading, without diagnosis of hypertension: Secondary | ICD-10-CM

## 2014-05-15 DIAGNOSIS — R220 Localized swelling, mass and lump, head: Secondary | ICD-10-CM

## 2014-05-15 DIAGNOSIS — R221 Localized swelling, mass and lump, neck: Secondary | ICD-10-CM

## 2014-05-15 NOTE — Patient Instructions (Signed)
Hypertension  As your heart beats, it forces blood through your arteries. This force is your blood pressure. If the pressure is too high, it is called hypertension (HTN) or high blood pressure. HTN is dangerous because you may have it and not know it. High blood pressure may mean that your heart has to work harder to pump blood. Your arteries may be narrow or stiff. The extra work puts you at risk for heart disease, stroke, and other problems.   Blood pressure consists of two numbers, a higher number over a lower, 110/72, for example. It is stated as "110 over 72." The ideal is below 120 for the top number (systolic) and under 80 for the bottom (diastolic). Write down your blood pressure today.  You should pay close attention to your blood pressure if you have certain conditions such as:   Heart failure.   Prior heart attack.   Diabetes   Chronic kidney disease.   Prior stroke.   Multiple risk factors for heart disease.  To see if you have HTN, your blood pressure should be measured while you are seated with your arm held at the level of the heart. It should be measured at least twice. A one-time elevated blood pressure reading (especially in the Emergency Department) does not mean that you need treatment. There may be conditions in which the blood pressure is different between your right and left arms. It is important to see your caregiver soon for a recheck.  Most people have essential hypertension which means that there is not a specific cause. This type of high blood pressure may be lowered by changing lifestyle factors such as:   Stress.   Smoking.   Lack of exercise.   Excessive weight.   Drug/tobacco/alcohol use.   Eating less salt.  Most people do not have symptoms from high blood pressure until it has caused damage to the body. Effective treatment can often prevent, delay or reduce that damage.  TREATMENT    When a cause has been identified, treatment for high blood pressure is directed at the cause. There are a large number of medications to treat HTN. These fall into several categories, and your caregiver will help you select the medicines that are best for you. Medications may have side effects. You should review side effects with your caregiver.  If your blood pressure stays high after you have made lifestyle changes or started on medicines,    Your medication(s) may need to be changed.   Other problems may need to be addressed.   Be certain you understand your prescriptions, and know how and when to take your medicine.   Be sure to follow up with your caregiver within the time frame advised (usually within two weeks) to have your blood pressure rechecked and to review your medications.   If you are taking more than one medicine to lower your blood pressure, make sure you know how and at what times they should be taken. Taking two medicines at the same time can result in blood pressure that is too low.  SEEK IMMEDIATE MEDICAL CARE IF:   You develop a severe headache, blurred or changing vision, or confusion.   You have unusual weakness or numbness, or a faint feeling.   You have severe chest or abdominal pain, vomiting, or breathing problems.  MAKE SURE YOU:    Understand these instructions.   Will watch your condition.   Will get help right away if you are not doing well   or get worse.  Document Released: 12/15/2005 Document Revised: 03/08/2012 Document Reviewed: 08/04/2008  ExitCare Patient Information 2014 [employer]  DASH Diet   The DASH diet stands for "Dietary Approaches to Stop Hypertension." It is a healthy eating plan that has been shown to reduce high blood pressure (hypertension) in as little as 14 days, while also possibly providing other significant health benefits. These other health benefits include reducing the risk of breast cancer after menopause and reducing the risk of type 2 diabetes, heart disease, colon cancer, and stroke. Health benefits also include weight loss and slowing kidney failure in patients with chronic kidney disease.   DIET GUIDELINES   Limit salt (sodium). Your diet should contain less than 1500 mg of sodium daily.   Limit refined or processed carbohydrates. Your diet should include mostly whole grains. Desserts and added sugars should be used sparingly.   Include small amounts of heart-healthy fats. These types of fats include nuts, oils, and tub margarine. Limit saturated and trans fats. These fats have been shown to be harmful in the body.  CHOOSING FOODS   The following food groups are based on a 2000 calorie diet. See your Registered Dietitian for individual calorie needs.  Grains and Grain Products (6 to 8 servings daily)   Eat More Often: Whole-wheat bread, brown rice, whole-grain or wheat pasta, quinoa, popcorn without added fat or salt (air popped).   Eat Less Often: White bread, white pasta, white rice, cornbread.  Vegetables (4 to 5 servings daily)   Eat More Often: Fresh, frozen, and canned vegetables. Vegetables may be raw, steamed, roasted, or grilled with a minimal amount of fat.   Eat Less Often/Avoid: Creamed or fried vegetables. Vegetables in a cheese sauce.  Fruit (4 to 5 servings daily)   Eat More Often: All fresh, canned (in natural juice), or frozen fruits. Dried fruits without added sugar. One hundred percent fruit juice ( cup [237 mL] daily).   Eat Less Often: Dried fruits with added sugar. Canned fruit in light or heavy syrup.   Lean Meats, Fish, and Poultry (2 servings or less daily. One serving is 3 to 4 oz [85-114 g]).   Eat More Often: Ninety percent or leaner ground beef, tenderloin, sirloin. Round cuts of beef, chicken breast, turkey breast. All fish. Grill, bake, or broil your meat. Nothing should be fried.   Eat Less Often/Avoid: Fatty cuts of meat, turkey, or chicken leg, thigh, or wing. Fried cuts of meat or fish.  Dairy (2 to 3 servings)   Eat More Often: Low-fat or fat-free milk, low-fat plain or light yogurt, reduced-fat or part-skim cheese.   Eat Less Often/Avoid: Milk (whole, 2%).Whole milk yogurt. Full-fat cheeses.  Nuts, Seeds, and Legumes (4 to 5 servings per week)   Eat More Often: All without added salt.   Eat Less Often/Avoid: Salted nuts and seeds, canned beans with added salt.  Fats and Sweets (limited)   Eat More Often: Vegetable oils, tub margarines without trans fats, sugar-free gelatin. Mayonnaise and salad dressings.   Eat Less Often/Avoid: Coconut oils, palm oils, butter, stick margarine, cream, half and half, cookies, candy, pie.  FOR MORE INFORMATION  The Dash Diet Eating Plan: www.dashdiet.org  Document Released: 12/04/2011 Document Revised: 03/08/2012 Document Reviewed: 12/04/2011  [employer] Patient Information 2014 [employer].

## 2014-05-15 NOTE — Progress Notes (Signed)
Subjective:    Patient ID: Brandon Hall, male    DOB: 06-30-1956, 58 y.o.   MRN: [id number]  HPI Pt here for follow-up for a neck mass. Pt was diagnosed with prostate cancer in February.  Pt got a CT scan of neck on 05/01/14. He also had a MRI of brain scheduled, but they state they did not have transportation to get to scan.    *Pt states he has an Best boy in [place]. Next appointment is 07/24/14. Pt also saw an ENT on last Friday. (Dr Dionne Ano in [place]). Pt told to keep ALL appointments with oncologists and ENT.    Review of Systems  All other systems reviewed and are negative.      Objective:   Physical Exam  Vitals reviewed. Constitutional: He appears well-developed and well-nourished.  Neck:  Left neck mass, asymmetric   Cardiovascular: Normal rate, regular rhythm, normal heart sounds and intact distal pulses.   Pulmonary/Chest: Effort normal. No respiratory distress. He has no wheezes.  Diminished breath sounds   Abdominal: Soft. Bowel sounds are normal. He exhibits no distension. There is no tenderness.  Neurological: He is alert.  Skin: Skin is warm and dry.  Psychiatric: He has a normal mood and affect. His behavior is normal. Judgment and thought content normal.    BP 160/93  Pulse 70  Temp(Src) 97.7 F (36.5 C) (Oral)  Ht 5\' 10"  (1.778 m)  Wt 141 lb 9.6 oz (64.229 kg)  BMI 20.32 kg/m2       Assessment & Plan:  1. Elevated blood pressure -Dash diet information given -Exercise encouraged - Stress Management  -Continue current meds -RTO in 2 weeks    2. Neck mass -Keep ALL appointments with ENT and Oncologists  -Pt Brandon talk to Oncologists about rescheduling MRI of brain  Jannifer Rodney, FNP

## 2014-05-16 ENCOUNTER — Ambulatory Visit (INDEPENDENT_AMBULATORY_CARE_PROVIDER_SITE_OTHER): Payer: Medicare HMO | Admitting: Family Medicine

## 2014-05-16 VITALS — BP 166/86 | HR 72 | Temp 98.6°F | Wt 142.4 lb

## 2014-05-16 DIAGNOSIS — R220 Localized swelling, mass and lump, head: Secondary | ICD-10-CM

## 2014-05-16 DIAGNOSIS — R221 Localized swelling, mass and lump, neck: Secondary | ICD-10-CM

## 2014-05-16 NOTE — Progress Notes (Signed)
Subjective:    Patient ID: Brandon Hall, male    DOB: Mar 26, 1956, 59 y.o.   MRN: [id number]  HPI This 58 y.o. male presents for evaluation of mass left neck.  He is seeing ENT and he states ENT told him that they could not do surgery on his neck and he wants his neck mass to be removed. The consult from ENT is faxed to our office.  The ENT surgeon Dr. Annalee Genta has scheduled him to have a fine needle aspiration done by the local radiologist.  The office is called and they are coordinating this and will call when the appointment is ready.   Review of Systems C/o left neck mass No chest pain, SOB, HA, dizziness, vision change, N/V, diarrhea, constipation, dysuria, urinary urgency or frequency, myalgias, arthralgias or rash.     Objective:   Physical Exam Vital signs noted  Well developed well nourished male.  HEENT - Head atraumatic Normocephalic                Eyes - OD opaque and blindness OS PERRLA                Ears - EAC's Wnl TM's Wnl Gross Hearing WNL                Nose - Nares patent                 Throat - oropharanx wnl                Neck - 3 cm palpable mass left neck Respiratory - Lungs CTA bilateral Cardiac - RRR S1 and S2 without murmur GI - Abdomen soft Nontender and bowel sounds active x 4 Extremities - No edema. Neuro - Grossly intact.       Assessment & Plan:  Mass of left side of neck Follow up with ENT and wrote down instructions for wife and patient.  Deatra Canter FNP

## 2014-05-17 ENCOUNTER — Other Ambulatory Visit (HOSPITAL_COMMUNITY): Payer: Self-pay | Admitting: Otolaryngology

## 2014-05-17 DIAGNOSIS — R221 Localized swelling, mass and lump, neck: Secondary | ICD-10-CM

## 2014-05-18 ENCOUNTER — Encounter (HOSPITAL_COMMUNITY): Payer: Self-pay | Admitting: Pharmacy Technician

## 2014-05-23 ENCOUNTER — Other Ambulatory Visit: Payer: Self-pay | Admitting: Radiology

## 2014-05-25 ENCOUNTER — Ambulatory Visit (HOSPITAL_COMMUNITY)
Admission: RE | Admit: 2014-05-25 | Discharge: 2014-05-25 | Disposition: A | Discharge status: Home / Self Care | Payer: Medicare HMO | Source: Ambulatory Visit | Attending: Otolaryngology | Admitting: Otolaryngology

## 2014-05-25 DIAGNOSIS — R220 Localized swelling, mass and lump, head: Secondary | ICD-10-CM | POA: Insufficient documentation

## 2014-05-25 DIAGNOSIS — R221 Localized swelling, mass and lump, neck: Secondary | ICD-10-CM

## 2014-05-25 DIAGNOSIS — R599 Enlarged lymph nodes, unspecified: Secondary | ICD-10-CM | POA: Diagnosis not present

## 2014-05-25 NOTE — Procedures (Signed)
L neck mass Bx 18 g core times three FLuid aspiration No comp

## 2014-05-29 ENCOUNTER — Ambulatory Visit (INDEPENDENT_AMBULATORY_CARE_PROVIDER_SITE_OTHER): Payer: Medicare HMO | Admitting: Family

## 2014-05-29 ENCOUNTER — Encounter: Payer: Self-pay | Admitting: Family

## 2014-05-29 VITALS — BP 158/92 | HR 81 | Temp 98.5°F | Ht 70.0 in | Wt 139.0 lb

## 2014-05-29 DIAGNOSIS — I10 Essential (primary) hypertension: Secondary | ICD-10-CM

## 2014-05-29 DIAGNOSIS — E039 Hypothyroidism, unspecified: Secondary | ICD-10-CM

## 2014-05-29 MED ORDER — LEVOTHYROXINE SODIUM 75 MCG PO TABS: 75.0000 ug | ORAL_TABLET | Freq: Every day | ORAL | Status: DC

## 2014-05-29 MED ORDER — HYDROCORTISONE 1 % EX CREA: 1.0000 "application " | TOPICAL_CREAM | Freq: Two times a day (BID) | CUTANEOUS | Status: DC

## 2014-05-29 MED ORDER — HYDROCHLOROTHIAZIDE 12.5 MG PO TABS: 12.5000 mg | ORAL_TABLET | Freq: Every day | ORAL | Status: DC

## 2014-05-29 NOTE — Patient Instructions (Addendum)
Hypertension  As your heart beats, it forces blood through your arteries. This force is your blood pressure. If the pressure is too high, it is called hypertension (HTN) or high blood pressure. HTN is dangerous because you may have it and not know it. High blood pressure may mean that your heart has to work harder to pump blood. Your arteries may be narrow or stiff. The extra work puts you at risk for heart disease, stroke, and other problems.   Blood pressure consists of two numbers, a higher number over a lower, 110/72, for example. It is stated as "110 over 72." The ideal is below 120 for the top number (systolic) and under 80 for the bottom (diastolic). Write down your blood pressure today.  You should pay close attention to your blood pressure if you have certain conditions such as:   Heart failure.   Prior heart attack.   Diabetes   Chronic kidney disease.   Prior stroke.   Multiple risk factors for heart disease.  To see if you have HTN, your blood pressure should be measured while you are seated with your arm held at the level of the heart. It should be measured at least twice. A one-time elevated blood pressure reading (especially in the Emergency Department) does not mean that you need treatment. There may be conditions in which the blood pressure is different between your right and left arms. It is important to see your caregiver soon for a recheck.  Most people have essential hypertension which means that there is not a specific cause. This type of high blood pressure may be lowered by changing lifestyle factors such as:   Stress.   Smoking.   Lack of exercise.   Excessive weight.   Drug/tobacco/alcohol use.   Eating less salt.  Most people do not have symptoms from high blood pressure until it has caused damage to the body. Effective treatment can often prevent, delay or reduce that damage.  TREATMENT    When a cause has been identified, treatment for high blood pressure is directed at the cause. There are a large number of medications to treat HTN. These fall into several categories, and your caregiver will help you select the medicines that are best for you. Medications may have side effects. You should review side effects with your caregiver.  If your blood pressure stays high after you have made lifestyle changes or started on medicines,    Your medication(s) may need to be changed.   Other problems may need to be addressed.   Be certain you understand your prescriptions, and know how and when to take your medicine.   Be sure to follow up with your caregiver within the time frame advised (usually within two weeks) to have your blood pressure rechecked and to review your medications.   If you are taking more than one medicine to lower your blood pressure, make sure you know how and at what times they should be taken. Taking two medicines at the same time can result in blood pressure that is too low.  SEEK IMMEDIATE MEDICAL CARE IF:   You develop a severe headache, blurred or changing vision, or confusion.   You have unusual weakness or numbness, or a faint feeling.   You have severe chest or abdominal pain, vomiting, or breathing problems.  MAKE SURE YOU:    Understand these instructions.   Will watch your condition.   Will get help right away if you are not doing well   or get worse.  Document Released: 12/15/2005 Document Revised: 03/08/2012 Document Reviewed: 08/04/2008  Exit[employer] Patient Information 2014 [employer]  DASH Diet   The DASH diet stands for "Dietary Approaches to Stop Hypertension." It is a healthy eating plan that has been shown to reduce high blood pressure (hypertension) in as little as 14 days, while also possibly providing other significant health benefits. These other health benefits include reducing the risk of breast cancer after menopause and reducing the risk of type 2 diabetes, heart disease, colon cancer, and stroke. Health benefits also include weight loss and slowing kidney failure in patients with chronic kidney disease.   DIET GUIDELINES   Limit salt (sodium). Your diet should contain less than 1500 mg of sodium daily.   Limit refined or processed carbohydrates. Your diet should include mostly whole grains. Desserts and added sugars should be used sparingly.   Include small amounts of heart-healthy fats. These types of fats include nuts, oils, and tub margarine. Limit saturated and trans fats. These fats have been shown to be harmful in the body.  CHOOSING FOODS   The following food groups are based on a 2000 calorie diet. See your Registered Dietitian for individual calorie needs.  Grains and Grain Products (6 to 8 servings daily)   Eat More Often: Whole-wheat bread, brown rice, whole-grain or wheat pasta, quinoa, popcorn without added fat or salt (air popped).   Eat Less Often: White bread, white pasta, white rice, cornbread.  Vegetables (4 to 5 servings daily)   Eat More Often: Fresh, frozen, and canned vegetables. Vegetables may be raw, steamed, roasted, or grilled with a minimal amount of fat.   Eat Less Often/Avoid: Creamed or fried vegetables. Vegetables in a cheese sauce.  Fruit (4 to 5 servings daily)   Eat More Often: All fresh, canned (in natural juice), or frozen fruits. Dried fruits without added sugar. One hundred percent fruit juice ( cup [237 mL] daily).   Eat Less Often: Dried fruits with added sugar. Canned fruit in light or heavy syrup.   Lean Meats, Fish, and Poultry (2 servings or less daily. One serving is 3 to 4 oz [85-114 g]).   Eat More Often: Ninety percent or leaner ground beef, tenderloin, sirloin. Round cuts of beef, chicken breast, turkey breast. All fish. Grill, bake, or broil your meat. Nothing should be fried.   Eat Less Often/Avoid: Fatty cuts of meat, turkey, or chicken leg, thigh, or wing. Fried cuts of meat or fish.  Dairy (2 to 3 servings)   Eat More Often: Low-fat or fat-free milk, low-fat plain or light yogurt, reduced-fat or part-skim cheese.   Eat Less Often/Avoid: Milk (whole, 2%).Whole milk yogurt. Full-fat cheeses.  Nuts, Seeds, and Legumes (4 to 5 servings per week)   Eat More Often: All without added salt.   Eat Less Often/Avoid: Salted nuts and seeds, canned beans with added salt.  Fats and Sweets (limited)   Eat More Often: Vegetable oils, tub margarines without trans fats, sugar-free gelatin. Mayonnaise and salad dressings.   Eat Less Often/Avoid: Coconut oils, palm oils, butter, stick margarine, cream, half and half, cookies, candy, pie.  FOR MORE INFORMATION  The Dash Diet Eating Plan: www.dashdiet.org  Document Released: 12/04/2011 Document Revised: 03/08/2012 Document Reviewed: 12/04/2011  [employer] Patient Information 2014 [employer].

## 2014-05-29 NOTE — Progress Notes (Signed)
Subjective:    Patient ID: Brandon Hall, male    DOB: Aug 17, 1956, 58 y.o.   MRN: [id number]  Hypertension This is a recurrent problem. The current episode started more than 1 month ago. The problem has been waxing and waning since onset. The problem is uncontrolled. Pertinent negatives include no anxiety, blurred vision, headaches, palpitations, peripheral edema or shortness of breath. Past treatments include nothing. The current treatment provides no improvement. Compliance problems include diet.  There is no history of heart failure or a thyroid problem.      Review of Systems  Eyes: Negative for blurred vision.  Respiratory: Negative.  Negative for shortness of breath.   Cardiovascular: Negative.  Negative for palpitations.  Genitourinary: Negative.   Musculoskeletal: Negative.   Neurological: Negative for headaches.  All other systems reviewed and are negative.      Objective:   Physical Exam  Vitals reviewed. Constitutional: He is oriented to person, place, and time. He appears well-developed and well-nourished. No distress.  Eyes: Pupils are equal, round, and reactive to light. Right eye exhibits no discharge. Left eye exhibits no discharge.  Neck:  Large mass on left neck   Cardiovascular: Normal rate, regular rhythm, normal heart sounds and intact distal pulses.   No murmur heard. Pulmonary/Chest: Effort normal and breath sounds normal. No respiratory distress. He has no wheezes.  Abdominal: Soft. Bowel sounds are normal. He exhibits no distension. There is no tenderness.  Musculoskeletal: Normal range of motion. He exhibits no edema and no tenderness.  Neurological: He is alert and oriented to person, place, and time.  Skin: Skin is warm and dry. No rash noted. No erythema.  Psychiatric: He has a normal mood and affect. His behavior is normal. Judgment and thought content normal.   BP 158/92  Pulse 81  Temp(Src) 98.5 F (36.9 C) (Oral)  Ht 5\' 10"  (1.778 m)  Wt 139  lb (63.05 kg)  BMI 19.94 kg/m2        Assessment & Plan:  1. Hypertension --Daily blood pressure log given with instructions on how to fill out and told to bring to next visit -Dash diet information given -Exercise encouraged - Stress Management  -Continue current meds -RTO in 4 weeks - hydrochlorothiazide (HYDRODIURIL) 12.5 MG tablet; Take 1 tablet (12.5 mg total) by mouth daily.  Dispense: 90 tablet; Refill: 3  2. Hypothyroidism - levothyroxine (SYNTHROID, LEVOTHROID) 75 MCG tablet; Take 1 tablet (75 mcg total) by mouth daily before breakfast.  Dispense: 30 tablet; Refill: 11  Jannifer Rodney, FNP

## 2014-06-13 ENCOUNTER — Other Ambulatory Visit (HOSPITAL_COMMUNITY): Payer: Self-pay | Admitting: Otolaryngology

## 2014-06-13 DIAGNOSIS — IMO0002 Reserved for concepts with insufficient information to code with codable children: Secondary | ICD-10-CM

## 2014-06-13 DIAGNOSIS — R229 Localized swelling, mass and lump, unspecified: Principal | ICD-10-CM

## 2014-06-19 ENCOUNTER — Ambulatory Visit (HOSPITAL_COMMUNITY): Payer: Medicare HMO

## 2014-06-20 ENCOUNTER — Ambulatory Visit (HOSPITAL_COMMUNITY)
Admission: RE | Admit: 2014-06-20 | Discharge: 2014-06-20 | Disposition: A | Discharge status: Home / Self Care | Payer: Medicare HMO | Source: Ambulatory Visit | Attending: Otolaryngology | Admitting: Otolaryngology

## 2014-06-20 DIAGNOSIS — IMO0002 Reserved for concepts with insufficient information to code with codable children: Secondary | ICD-10-CM

## 2014-06-20 DIAGNOSIS — R229 Localized swelling, mass and lump, unspecified: Secondary | ICD-10-CM

## 2014-06-20 DIAGNOSIS — C760 Malignant neoplasm of head, face and neck: Secondary | ICD-10-CM | POA: Diagnosis present

## 2014-06-20 LAB — GLUCOSE, CAPILLARY: GLUCOSE-CAPILLARY: 101 mg/dL — AB (ref 70–99)

## 2014-06-20 MED ORDER — FLUDEOXYGLUCOSE F - 18 (FDG) INJECTION
8.1000 | Freq: Once | INTRAVENOUS | Status: AC | PRN
  Administered 2014-06-20: 8.1 via INTRAVENOUS

## 2014-06-22 ENCOUNTER — Other Ambulatory Visit: Payer: Self-pay | Admitting: Otolaryngology

## 2014-06-23 ENCOUNTER — Other Ambulatory Visit (HOSPITAL_COMMUNITY): Payer: Self-pay | Admitting: *Deleted

## 2014-06-23 ENCOUNTER — Encounter (HOSPITAL_COMMUNITY)
Admission: RE | Admit: 2014-06-23 | Discharge: 2014-06-23 | Disposition: A | Discharge status: Home / Self Care | Payer: Medicare HMO | Source: Ambulatory Visit | Attending: Otolaryngology | Admitting: Otolaryngology

## 2014-06-23 ENCOUNTER — Encounter (HOSPITAL_COMMUNITY): Payer: Self-pay

## 2014-06-23 ENCOUNTER — Ambulatory Visit (HOSPITAL_COMMUNITY)
Admission: RE | Admit: 2014-06-23 | Discharge: 2014-06-23 | Disposition: A | Discharge status: Home / Self Care | Payer: Medicare HMO | Source: Ambulatory Visit | Attending: Anesthesiology | Admitting: Anesthesiology

## 2014-06-23 DIAGNOSIS — R220 Localized swelling, mass and lump, head: Secondary | ICD-10-CM | POA: Diagnosis present

## 2014-06-23 DIAGNOSIS — R221 Localized swelling, mass and lump, neck: Secondary | ICD-10-CM | POA: Diagnosis present

## 2014-06-23 DIAGNOSIS — Z01818 Encounter for other preprocedural examination: Secondary | ICD-10-CM | POA: Insufficient documentation

## 2014-06-23 DIAGNOSIS — Z01812 Encounter for preprocedural laboratory examination: Secondary | ICD-10-CM | POA: Insufficient documentation

## 2014-06-23 HISTORY — DX: Unspecified osteoarthritis, unspecified site: M19.90

## 2014-06-23 LAB — CBC
HCT: 48.7 % (ref 39.0–52.0)
Hemoglobin: 15.5 g/dL (ref 13.0–17.0)
MCH: 26.9 pg (ref 26.0–34.0)
MCHC: 31.8 g/dL (ref 30.0–36.0)
MCV: 84.5 fL (ref 78.0–100.0)
PLATELETS: 176 10*3/uL (ref 150–400)
RBC: 5.76 MIL/uL (ref 4.22–5.81)
RDW: 15 % (ref 11.5–15.5)
WBC: 5.4 10*3/uL (ref 4.0–10.5)

## 2014-06-23 LAB — BASIC METABOLIC PANEL
BUN: 12 mg/dL (ref 6–23)
CALCIUM: 9.8 mg/dL (ref 8.4–10.5)
CO2: 27 mEq/L (ref 19–32)
CREATININE: 0.94 mg/dL (ref 0.50–1.35)
Chloride: 97 mEq/L (ref 96–112)
GFR calc Af Amer: >90 mL/min (ref >90)
GFR, EST NON AFRICAN AMERICAN: 90 mL/min — AB (ref >90)
GLUCOSE: 100 mg/dL — AB (ref 70–99)
Potassium: 4.5 mEq/L (ref 3.7–5.3)
Sodium: 139 mEq/L (ref 137–147)

## 2014-06-23 NOTE — Progress Notes (Signed)
Pt's PCP is Dr. [name] in [place], [place].

## 2014-06-23 NOTE — Pre-Procedure Instructions (Signed)
Brandon Hall  06/23/2014   Your procedure is scheduled on:  Thursday, June 29, 2014 at 7:30 AM.   Report to [facility] Entrance "A" Admitting Office at 5:30 AM.   Call this number if you have problems the morning of surgery: [phone]   Remember:   Do not eat food or drink liquids after midnight Wednesday, 06/28/14.   Take these medicines the morning of surgery with A SIP OF WATER: levothyroxine (SYNTHROID, LEVOTHROID)  Do not take any Aspirin products or NSAIDs (Ibuprofen, Aleve, etc.) prior to surgery.     Do not wear jewelry.  Do not wear lotions, powders, or cologne. You may wear deodorant.  Men may shave face and neck.  Do not bring valuables to the hospital.  [facility] is not responsible                  for any belongings or valuables.               Contacts, dentures or bridgework may not be worn into surgery.  Leave suitcase in the car. After surgery it may be brought to your room.  For patients admitted to the hospital, discharge time is determined by your                treatment team.               Patients discharged the day of surgery will not be allowed to drive home.  Name and phone number of your driver: Family/friend   Special Instructions: [facility] - Preparing for Surgery  Before surgery, you can play an important role.  Because skin is not sterile, your skin needs to be as free of germs as possible.  You can reduce the number of germs on you skin by washing with CHG (chlorahexidine gluconate) soap before surgery.  CHG is an antiseptic cleaner which kills germs and bonds with the skin to continue killing germs even after washing.  Please DO NOT use if you have an allergy to CHG or antibacterial soaps.  If your skin becomes reddened/irritated stop using the CHG and inform your nurse when you arrive at Short Stay.  Do not shave (including legs and underarms) for at least 48 hours prior to the first CHG shower.  You may shave your face.  Please  follow these instructions carefully:   1.  Shower with CHG Soap the night before surgery and the                                morning of Surgery.  2.  If you choose to wash your hair, wash your hair first as usual with your       normal shampoo.  3.  After you shampoo, rinse your hair and body thoroughly to remove the                      Shampoo.  4.  Use CHG as you would any other liquid soap.  You can apply chg directly       to the skin and wash gently with scrungie or a clean washcloth.  5.  Apply the CHG Soap to your body ONLY FROM THE NECK DOWN.        Do not use on open wounds or open sores.  Avoid contact with your eyes, ears, mouth and genitals (private parts).  Wash  genitals (private parts) with your normal soap.  6.  Wash thoroughly, paying special attention to the area where your surgery        will be performed.  7.  Thoroughly rinse your body with warm water from the neck down.  8.  DO NOT shower/wash with your normal soap after using and rinsing off       the CHG Soap.  9.  Pat yourself dry with a clean towel.            10.  Wear clean pajamas.            11.  Place clean sheets on your bed the night of your first shower and do not        sleep with pets.  Day of Surgery  Do not apply any lotions the morning of surgery.  Please wear clean clothes to the hospital/surgery center.     Please read over the following fact sheets that you were given: Pain Booklet, Coughing and Deep Breathing and Surgical Site Infection Prevention

## 2014-06-23 NOTE — Progress Notes (Signed)
Anesthesia PAT Evaluation:  Patient is a 58 year old male scheduled for biopsy of nasal pharynx on 06/29/14 by Dr. Wilburn Cornelia. FNA on 05/25/14 showed malignant cells consistent with SCC. According to 06/05/14 ENT notes, flexible laryngoscopy showed no evidence of mass, ulcer or tumor in the nasophaynx, tonsil region or tongue base.  Normal appearing larynx, vocal cord mobility and hypopharynx without mass or tumor.    History includes prostate cancer s/p cryoablation 04/07/14, Graves disease s/p radioactive iodine with secondary hypothyroidism, blindness in right eye, HTN, arthritis, former smoker.  He denied history of CAD/CHF or DM. PCP is Dr. [name].  EKG on 04/07/14 showed: NSR, LAD, LVH.    CT of the neck with contrast on 05/05/14 showed:  1. Predominantly cystic mass in the left neck corresponding to the palpable area of concern, 3-3.5 cm diameter. Adjacent abnormal lymph nodes. Burtis Junes this is a cystic/necrotic node due to metastatic disease (top considerations in this setting include regional skin  cancer, occult pharyngeal primary, prostate metastasis). This should be amenable to imaging guided needle biopsy.  2. No other lymphadenopathy or neck mass.  3. Subtle hyper enhancement in the central right cerebellar hemisphere. This might be a small benign vascular malformation, but recommend followup brain MRI in this setting to further characterize.  4. No suspicious osseous lesion identified in the neck or upper chest.  5. Incidental benign submental lipoma.  CXR on 06/22/14 showed: FINDINGS: Cardiomediastinal silhouette is within normal limits. The lungs remain hyperinflated. There is no evidence of airspace consolidation, pleural effusion, or pneumothorax. Bulky right-sided osteophyte is again seen at T8-9. IMPRESSION: No active cardiopulmonary disease.  Preoperative labs noted.    He reports rather sudden onset of left neck mass since 03/2014.  He denies SOB at rest.  He can lie flat, but has  to switch to side frequently for comfort.  No significant dysphagia, but has to eat slowly.  No hoarseness.  No chest pain.  On exam he is a tall, thin black male in NAD.  Heart RRR. Lungs sounds were clear but very diminished.  No LE edema. Right eye defect. Good mouth opening. Soft, mobile submental mass. (Not new per patient, lipoma?).  Large, firm left neck mass.    Anticipate he can proceed as planned.  His assigned anesthesiologist will discuss the definitive anesthesia plan on the day of surgery.    George Hugh [facility] Short Stay Center/Anesthesiology Phone [phone] 06/23/2014 4:22 PM

## 2014-06-26 ENCOUNTER — Other Ambulatory Visit: Payer: Self-pay | Admitting: Otolaryngology

## 2014-06-28 NOTE — Anesthesia Preprocedure Evaluation (Addendum)
Anesthesia Evaluation  Patient identified by MRN, date of birth, ID band Patient awake    Reviewed: Allergy & Precautions, H&P , NPO status , Patient's Chart, lab work & pertinent test results  History of Anesthesia Complications Negative for: history of anesthetic complications  Airway Mallampati: I TM Distance: >3 FB Neck ROM: Full    Dental  (+) Edentulous Upper, Edentulous Lower   Pulmonary former smoker (quit 10/14),  breath sounds clear to auscultation  Pulmonary exam normal       Cardiovascular hypertension, Pt. on medications - anginaRhythm:Regular Rate:Normal     Neuro/Psych negative neurological ROS     GI/Hepatic negative GI ROS, Neg liver ROS,   Endo/Other  Hypothyroidism   Renal/GU negative Renal ROS   Prostate Cancer: XRT    Musculoskeletal   Abdominal   Peds  Hematology negative hematology ROS (+)   Anesthesia Other Findings   Reproductive/Obstetrics                        Anesthesia Physical Anesthesia Plan  ASA: III  Anesthesia Plan: General   Post-op Pain Management:    Induction: Intravenous  Airway Management Planned: Oral ETT  Additional Equipment:   Intra-op Plan:   Post-operative Plan: Extubation in OR  Informed Consent: I have reviewed the patients History and Physical, chart, labs and discussed the procedure including the risks, benefits and alternatives for the proposed anesthesia with the patient or authorized representative who has indicated his/her understanding and acceptance.     Plan Discussed with: Surgeon and CRNA  Anesthesia Plan Comments: (Plan routine monitors, GETA)        Anesthesia Quick Evaluation

## 2014-06-29 ENCOUNTER — Encounter (HOSPITAL_COMMUNITY)
Admission: RE | Disposition: A | Discharge status: Home / Self Care | Payer: Self-pay | Source: Ambulatory Visit | Attending: Otolaryngology

## 2014-06-29 ENCOUNTER — Encounter (HOSPITAL_COMMUNITY): Payer: Medicare HMO | Admitting: Vascular Surgery

## 2014-06-29 ENCOUNTER — Encounter (HOSPITAL_COMMUNITY): Payer: Self-pay | Admitting: *Deleted

## 2014-06-29 ENCOUNTER — Ambulatory Visit (HOSPITAL_COMMUNITY): Payer: Medicare HMO | Admitting: Anesthesiology

## 2014-06-29 ENCOUNTER — Ambulatory Visit (HOSPITAL_COMMUNITY)
Admission: RE | Admit: 2014-06-29 | Discharge: 2014-06-29 | Disposition: A | Discharge status: Home / Self Care | Payer: Medicare HMO | Source: Ambulatory Visit | Attending: Otolaryngology | Admitting: Otolaryngology

## 2014-06-29 DIAGNOSIS — Z923 Personal history of irradiation: Secondary | ICD-10-CM | POA: Insufficient documentation

## 2014-06-29 DIAGNOSIS — J392 Other diseases of pharynx: Secondary | ICD-10-CM

## 2014-06-29 DIAGNOSIS — I10 Essential (primary) hypertension: Secondary | ICD-10-CM | POA: Insufficient documentation

## 2014-06-29 DIAGNOSIS — H5440 Blindness, one eye, unspecified eye: Secondary | ICD-10-CM | POA: Insufficient documentation

## 2014-06-29 DIAGNOSIS — Z79899 Other long term (current) drug therapy: Secondary | ICD-10-CM | POA: Insufficient documentation

## 2014-06-29 DIAGNOSIS — C119 Malignant neoplasm of nasopharynx, unspecified: Secondary | ICD-10-CM

## 2014-06-29 DIAGNOSIS — J342 Deviated nasal septum: Secondary | ICD-10-CM | POA: Insufficient documentation

## 2014-06-29 DIAGNOSIS — E890 Postprocedural hypothyroidism: Secondary | ICD-10-CM | POA: Insufficient documentation

## 2014-06-29 DIAGNOSIS — Z8546 Personal history of malignant neoplasm of prostate: Secondary | ICD-10-CM | POA: Insufficient documentation

## 2014-06-29 DIAGNOSIS — Z87891 Personal history of nicotine dependence: Secondary | ICD-10-CM | POA: Insufficient documentation

## 2014-06-29 DIAGNOSIS — C770 Secondary and unspecified malignant neoplasm of lymph nodes of head, face and neck: Secondary | ICD-10-CM | POA: Insufficient documentation

## 2014-06-29 HISTORY — DX: Malignant neoplasm of nasopharynx, unspecified: C11.9

## 2014-06-29 SURGERY — BIOPSY, NASOPHARYNX
Anesthesia: General

## 2014-06-29 MED ORDER — OXYCODONE HCL 5 MG PO TABS: 5.0000 mg | ORAL_TABLET | Freq: Once | ORAL | Status: DC | PRN

## 2014-06-29 MED ORDER — LACTATED RINGERS IV SOLN
INTRAVENOUS | Status: DC | PRN
  Administered 2014-06-29: 07:00:00 via INTRAVENOUS

## 2014-06-29 MED ORDER — MIDAZOLAM HCL 2 MG/2ML IJ SOLN
INTRAMUSCULAR | Status: AC
  Filled 2014-06-29: qty 2

## 2014-06-29 MED ORDER — PROPOFOL 10 MG/ML IV BOLUS
INTRAVENOUS | Status: AC
  Filled 2014-06-29: qty 20

## 2014-06-29 MED ORDER — FENTANYL CITRATE 0.05 MG/ML IJ SOLN
INTRAMUSCULAR | Status: AC
  Filled 2014-06-29: qty 5

## 2014-06-29 MED ORDER — LIDOCAINE-EPINEPHRINE 1 %-1:100000 IJ SOLN
INTRAMUSCULAR | Status: AC
  Filled 2014-06-29: qty 1

## 2014-06-29 MED ORDER — ONDANSETRON HCL 4 MG/2ML IJ SOLN
INTRAMUSCULAR | Status: DC | PRN
Start: 2014-06-29 — End: 2014-06-29
  Administered 2014-06-29: 4 mg via INTRAVENOUS

## 2014-06-29 MED ORDER — ARTIFICIAL TEARS OP OINT
TOPICAL_OINTMENT | OPHTHALMIC | Status: DC | PRN
  Administered 2014-06-29: 1 via OPHTHALMIC

## 2014-06-29 MED ORDER — NEOSTIGMINE METHYLSULFATE 10 MG/10ML IV SOLN
INTRAVENOUS | Status: DC | PRN
  Administered 2014-06-29: 2 mg via INTRAVENOUS

## 2014-06-29 MED ORDER — MEPERIDINE HCL 25 MG/ML IJ SOLN: 6.2500 mg | INTRAMUSCULAR | Status: DC | PRN

## 2014-06-29 MED ORDER — OXYCODONE HCL 5 MG/5ML PO SOLN: 5.0000 mg | Freq: Once | ORAL | Status: DC | PRN

## 2014-06-29 MED ORDER — LIDOCAINE HCL (CARDIAC) 20 MG/ML IV SOLN
INTRAVENOUS | Status: DC | PRN
  Administered 2014-06-29: 100 mg via INTRAVENOUS

## 2014-06-29 MED ORDER — FENTANYL CITRATE 0.05 MG/ML IJ SOLN
INTRAMUSCULAR | Status: DC | PRN
  Administered 2014-06-29 (×2): 50 ug via INTRAVENOUS

## 2014-06-29 MED ORDER — SODIUM CHLORIDE 0.9 % IR SOLN
Status: DC | PRN
  Administered 2014-06-29: 1000 mL

## 2014-06-29 MED ORDER — PROMETHAZINE HCL 25 MG/ML IJ SOLN: 6.2500 mg | INTRAMUSCULAR | Status: DC | PRN

## 2014-06-29 MED ORDER — PROPOFOL 10 MG/ML IV BOLUS
INTRAVENOUS | Status: DC | PRN
  Administered 2014-06-29: 20 mg via INTRAVENOUS
  Administered 2014-06-29: 30 mg via INTRAVENOUS
  Administered 2014-06-29: 150 mg via INTRAVENOUS

## 2014-06-29 MED ORDER — FENTANYL CITRATE 0.05 MG/ML IJ SOLN: 25.0000 ug | INTRAMUSCULAR | Status: DC | PRN

## 2014-06-29 MED ORDER — ROCURONIUM BROMIDE 100 MG/10ML IV SOLN
INTRAVENOUS | Status: DC | PRN
  Administered 2014-06-29: 20 mg via INTRAVENOUS

## 2014-06-29 MED ORDER — OXYMETAZOLINE HCL 0.05 % NA SOLN
NASAL | Status: DC | PRN
  Administered 2014-06-29: 1 via NASAL

## 2014-06-29 MED ORDER — SUCCINYLCHOLINE CHLORIDE 20 MG/ML IJ SOLN
INTRAMUSCULAR | Status: DC | PRN
  Administered 2014-06-29: 120 mg via INTRAVENOUS

## 2014-06-29 MED ORDER — CEFAZOLIN SODIUM 1-5 GM-% IV SOLN
INTRAVENOUS | Status: DC | PRN
  Administered 2014-06-29: 1 g via INTRAVENOUS

## 2014-06-29 MED ORDER — CEFAZOLIN (ANCEF) 1 G IV SOLR
1.0000 g | INTRAVENOUS | Status: DC
  Filled 2014-06-29: qty 1

## 2014-06-29 MED ORDER — OXYMETAZOLINE HCL 0.05 % NA SOLN
NASAL | Status: AC
  Filled 2014-06-29: qty 15

## 2014-06-29 MED ORDER — LIDOCAINE-EPINEPHRINE 1 %-1:100000 IJ SOLN: INTRAMUSCULAR | Status: DC | PRN

## 2014-06-29 MED ORDER — GLYCOPYRROLATE 0.2 MG/ML IJ SOLN
INTRAMUSCULAR | Status: DC | PRN
  Administered 2014-06-29: 0.2 mg via INTRAVENOUS

## 2014-06-29 SURGICAL SUPPLY — 47 items
ATTRACTOMAT 16X20 MAGNETIC DRP (DRAPES) IMPLANT
BLADE RAD40 ROTATE 4M 4 5PK (BLADE) IMPLANT
BLADE RAD40 ROTATE 4M 4MM 5PK (BLADE)
BLADE RAD60 ROTATE M4 4 5PK (BLADE) IMPLANT
BLADE RAD60 ROTATE M4 4MM 5PK (BLADE)
BLADE TRICUT ROTATE M4 4 5PK (BLADE) ×2 IMPLANT
BLADE TRICUT ROTATE M4 4MM 5PK (BLADE) ×1
CANISTER SUCTION 2500CC (MISCELLANEOUS) ×3 IMPLANT
COAGULATOR SUCT 6 FR SWTCH (ELECTROSURGICAL)
COAGULATOR SUCT SWTCH 10FR 6 (ELECTROSURGICAL) IMPLANT
DECANTER SPIKE VIAL GLASS SM (MISCELLANEOUS) ×3 IMPLANT
DRESSING NASAL KENNEDY 3.5X.9 (MISCELLANEOUS) IMPLANT
DRSG NASAL KENNEDY 3.5X.9 (MISCELLANEOUS)
DRSG TELFA 3X8 NADH (GAUZE/BANDAGES/DRESSINGS) ×3 IMPLANT
ELECT REM PT RETURN 9FT ADLT (ELECTROSURGICAL) ×3
ELECTRODE REM PT RTRN 9FT ADLT (ELECTROSURGICAL) ×1 IMPLANT
FILTER ARTHROSCOPY CONVERTOR (FILTER) IMPLANT
GLOVE BIO SURGEON STRL SZ7.5 (GLOVE) ×3 IMPLANT
GLOVE BIOGEL M 7.0 STRL (GLOVE) ×6 IMPLANT
GLOVE BIOGEL PI IND STRL 7.5 (GLOVE) ×2 IMPLANT
GLOVE BIOGEL PI INDICATOR 7.5 (GLOVE) ×4
GLOVE SURG SS PI 7.5 STRL IVOR (GLOVE) ×3 IMPLANT
GOWN STRL REUS W/ TWL LRG LVL3 (GOWN DISPOSABLE) ×3 IMPLANT
GOWN STRL REUS W/TWL LRG LVL3 (GOWN DISPOSABLE) ×6
KIT BASIN OR (CUSTOM PROCEDURE TRAY) ×3 IMPLANT
KIT ROOM TURNOVER OR (KITS) ×3 IMPLANT
NEEDLE 18GX1X1/2 (RX/OR ONLY) (NEEDLE) IMPLANT
NS IRRIG 1000ML POUR BTL (IV SOLUTION) ×3 IMPLANT
PAD ARMBOARD 7.5X6 YLW CONV (MISCELLANEOUS) ×6 IMPLANT
PENCIL BUTTON HOLSTER BLD 10FT (ELECTRODE) ×3 IMPLANT
SOLUTION ANTI FOG 6CC (MISCELLANEOUS) ×3 IMPLANT
SPECIMEN JAR SMALL (MISCELLANEOUS) IMPLANT
SPONGE NEURO XRAY DETECT 1X3 (DISPOSABLE) ×3 IMPLANT
SUT CHROMIC 5 0 P 3 (SUTURE) IMPLANT
SUT ETHILON 3 0 FSL (SUTURE) IMPLANT
SUT PLAIN 4 0 ~~LOC~~ 1 (SUTURE) IMPLANT
SWAB COLLECTION DEVICE MRSA (MISCELLANEOUS) IMPLANT
SYR BULB 3OZ (MISCELLANEOUS) ×3 IMPLANT
SYR CONTROL 10ML LL (SYRINGE) ×3 IMPLANT
TOWEL OR 17X24 6PK STRL BLUE (TOWEL DISPOSABLE) ×3 IMPLANT
TOWEL OR 17X26 10 PK STRL BLUE (TOWEL DISPOSABLE) ×3 IMPLANT
TRAY ENT MC OR (CUSTOM PROCEDURE TRAY) ×3 IMPLANT
TUBE ANAEROBIC SPECIMEN COL (MISCELLANEOUS) IMPLANT
TUBE CONNECTING 12'X1/4 (SUCTIONS)
TUBE CONNECTING 12X1/4 (SUCTIONS) IMPLANT
TUBING EXTENTION W/L.L. (IV SETS) IMPLANT
WATER STERILE IRR 1000ML POUR (IV SOLUTION) IMPLANT

## 2014-06-29 NOTE — Op Note (Signed)
NAME:  ETAI, LISENBY NO.:  [id number]  MEDICAL RECORD NO.:  [id number]  LOCATION:  [facility]                         FACILITY:  [facility]  PHYSICIAN:  Kinnie Scales. Annalee Genta, M.D.DATE OF BIRTH:  11/07/1956  DATE OF PROCEDURE:  06/29/2014 DATE OF DISCHARGE:                              OPERATIVE REPORT   PREOPERATIVE DIAGNOSES: 1. Left neck metastatic squamous cell carcinoma. 2. Possible nasopharyngeal primary.  POSTOPERATIVE DIAGNOSES: 1. Left neck metastatic squamous cell carcinoma. 2. Possible nasopharyngeal primary.  INDICATION FOR SURGERY: 1. Left neck metastatic squamous cell carcinoma. 2. Possible nasopharyngeal primary.  SURGICAL PROCEDURE:  Endoscopic biopsy of the nasopharynx.  ANESTHESIA:  General endotracheal.  SURGEON:  Kinnie Scales. Annalee Genta, M.D.  COMPLICATIONS:  None.  BLOOD LOSS:  Minimal.  DISPOSITION:  The patient transferred from the operating room to the recovery room in stable condition.  BRIEF HISTORY:  The patient is a 58 year old black male with a history of metastatic squamous cell carcinoma.  He presented for evaluation with a left lateral neck mass.  Prior history included prostate cancer.  The patient has undergone a prostatectomy and radiation therapy.  No upper respiratory complaints, sore throat, or otalgia.  The patient was completely asymptomatic, and endoscopic examination in the office showed no evidence of nasal, nasopharynx, base of tongue, and supraglottis, or laryngeal primary tumor.  A needle biopsy of the left neck was performed and pathology was consistent with metastatic squamous cell carcinoma.  A PET scan was performed.  An area of increased activity was noted in the left nasopharynx consistent with possible primary squamous cell carcinoma.  Based on this history and finding, endoscopic biopsy of nasopharynx was scheduled for tissue diagnosis.  The risks and benefits of procedure were discussed in detail with the  patient and his wife and surgery is scheduled as an outpatient under general anesthesia at [facility].  DESCRIPTION OF PROCEDURE:  The patient was brought to the operating room on June 29, 2014, placed in supine position on the operating table. General endotracheal anesthesia was established without difficulty. When the patient adequately anesthetized, he was positioned and prepped and draped.  Nasal cavity was then packed with Afrin soaked cotton pledgets and were left in place for approximately 10 minutes for vasoconstriction.  Using a 0-degree endoscope, the patient's nasal cavity, nasopharynx were examined.  Nasal cavity showed mild septal deviation but otherwise normal.  No evidence of mass or discharge.  No infection.  The nasopharynx appeared normal.  The patient had a small amount of residual adenoid tissue, no evidence of ulcer or mass.  No tumor or bleeding.  There was a small cystic mass in the central component of the adenoid bed.  After inspection, biopsies were then undertaken.  These included right superior and inferior nasopharynx, left superior and inferior nasopharynx, and central adenoid tissue at the site of the adenoid cyst.  Each specimen was marked and sent independently to Pathology for gross microscopic evaluation.  There was minimal bleeding.  No cautery was used.  The patient was awakened from his anesthetic.  He was then extubated and was transferred from the operating room to the recovery room in stable condition.  No complications.  No blood loss.          ______________________________ Kinnie Scales Annalee Genta, M.D.     DLS/MEDQ  D:  44/02/4741  T:  06/29/2014  Job:  [id number]

## 2014-06-29 NOTE — Anesthesia Procedure Notes (Signed)
Procedure Name: Intubation Date/Time: 06/29/2014 7:36 AM Performed by: Scheryl Darter Pre-anesthesia Checklist: Patient identified, Emergency Drugs available, Suction available, Patient being monitored and Timeout performed Patient Re-evaluated:Patient Re-evaluated prior to inductionOxygen Delivery Method: Circle system utilized Preoxygenation: Pre-oxygenation with 100% oxygen Intubation Type: IV induction Ventilation: Mask ventilation without difficulty Laryngoscope Size: Mac and 4 Grade View: Grade I Tube type: Oral Tube size: 7.5 mm Number of attempts: 1 Airway Equipment and Method: Stylet Placement Confirmation: ETT inserted through vocal cords under direct vision,  positive ETCO2 and breath sounds checked- equal and bilateral Secured at: 23 cm Tube secured with: Tape Dental Injury: Teeth and Oropharynx as per pre-operative assessment  Difficulty Due To: Difficult Airway- due to reduced neck mobility

## 2014-06-29 NOTE — Discharge Instructions (Signed)

## 2014-06-29 NOTE — Anesthesia Postprocedure Evaluation (Signed)
Anesthesia Post Note  Patient: Brandon Hall  Procedure(s) Performed: Procedure(s) (LRB): BIOPSY OF NASOPHARYNX (N/A)  Anesthesia type: general  Patient location: PACU  Post pain: Pain level controlled  Post assessment: Patient's Cardiovascular Status Stable  Last Vitals:  Filed Vitals:   06/29/14 0915  BP: 154/97  Pulse: 70  Temp:   Resp: 15    Post vital signs: Reviewed and stable  Level of consciousness: sedated  Complications: No apparent anesthesia complications

## 2014-06-29 NOTE — Brief Op Note (Signed)
06/29/2014  8:13 AM  PATIENT:  Brandon Hall  58 y.o. male  PRE-OPERATIVE DIAGNOSIS:  METASTATIC SqCell Ca of LEFT NECK  POST-OPERATIVE DIAGNOSIS:  METASTATIC SqCell Ca of LEFT NECK  PROCEDURE:  Procedure(s): BIOPSY OF NASOPHARYNX (N/A)  SURGEON:  Surgeon(s) and Role:    * Jerrell Belfast, MD - Primary  PHYSICIAN ASSISTANT:   ASSISTANTS: none   ANESTHESIA:   general  EBL:   Min  BLOOD ADMINISTERED:none  DRAINS: none   LOCAL MEDICATIONS USED:  NONE  SPECIMEN:  Source of Specimen:  Nasopharynx  DISPOSITION OF SPECIMEN:  PATHOLOGY  COUNTS:  YES  TOURNIQUET:  * No tourniquets in log *  DICTATION: .Other Dictation: Dictation Number [id number]  PLAN OF CARE: Discharge to home after PACU  PATIENT DISPOSITION:  PACU - hemodynamically stable.   Delay start of Pharmacological VTE agent (>24hrs) due to surgical blood loss or risk of bleeding: not applicable

## 2014-06-29 NOTE — Transfer of Care (Signed)
Immediate Anesthesia Transfer of Care Note  Patient: Brandon Hall  Procedure(s) Performed: Procedure(s): BIOPSY OF NASOPHARYNX (N/A)  Patient Location: PACU  Anesthesia Type:General  Level of Consciousness: awake, alert , oriented and sedated  Airway & Oxygen Therapy: Patient Spontanous Breathing and Patient connected to nasal cannula oxygen  Post-op Assessment: Report given to PACU RN, Post -op Vital signs reviewed and stable and Patient moving all extremities  Post vital signs: Reviewed and stable  Complications: No apparent anesthesia complications

## 2014-06-29 NOTE — Progress Notes (Signed)
Noted large growth on left neck, no respiratory distress

## 2014-06-29 NOTE — H&P (Signed)
Brandon Hall is an 58 y.o. male.   Chief Complaint: Left neck mass HPI: Metastatic SqCCa to left neck, PET scan shows activity in NP.  Past Medical History  Diagnosis Date  . Graves disease   . Hypothyroidism, postradioiodine therapy   . History of shingles     RASH  02-03-2014  PER PCP RESOLVING  . Prostate cancer     DX  SEVERAL  YRS   AGO  WITH RADIATION THERAPY  . Cataract, secondary obscuring vision     LEFT  . Blindness of right eye   . Hypertension     NO MEDS ,  MONITORED BY PCP  . Arthritis     Past Surgical History  Procedure Laterality Date  . Colonoscopy  10/20/2011    Procedure: COLONOSCOPY;  Surgeon: Daneil Dolin, MD;  Location[facility];  Service: Endoscopy;  Laterality: N/A;  1:00 pm  . Cryoablation N/A 04/07/2014    Procedure: SALVAGE CRYO ABLATION PROSTATE;  Surgeon: Lowella Bandy, MD;  Location: [facility];  Service: Urology;  Laterality: N/A;    Family History  Problem Relation Age of Onset  . Colon cancer Father   . Hypertension Mother    Social History:  reports that he quit smoking about 8 months ago. His smoking use included Cigarettes. He has a 40 pack-year smoking history. He has never used smokeless tobacco. He reports that he does not drink alcohol or use illicit drugs.  Allergies: No Known Allergies  Medications Prior to Admission  Medication Sig Dispense Refill  . hydrochlorothiazide (HYDRODIURIL) 12.5 MG tablet Take 1 tablet (12.5 mg total) by mouth daily.  90 tablet  3  . levothyroxine (SYNTHROID, LEVOTHROID) 75 MCG tablet Take 1 tablet (75 mcg total) by mouth daily before breakfast.  30 tablet  11    No results found for this or any previous visit (from the past 48 hour(s)). No results found.  Review of Systems  Constitutional: Negative.   HENT: Negative.   Respiratory: Negative.   Cardiovascular: Negative.   Gastrointestinal: Negative.     Blood pressure 157/94, pulse 70, temperature 97.9 F (36.6 C),  temperature source Oral, resp. rate 20, height 6' (1.829 m), weight 61.151 kg (134 lb 13 oz), SpO2 100.00%. Physical Exam  Constitutional: He is oriented to person, place, and time. He appears well-developed.  HENT:  Head: Normocephalic.  Neck: Normal range of motion. Neck supple.  Cardiovascular: Normal rate.   Respiratory: Effort normal.  GI: Soft.  Musculoskeletal: Normal range of motion.  Lymphadenopathy:    He has cervical adenopathy.  Neurological: He is alert and oriented to person, place, and time.     Assessment/Plan Admit for endoscopic biopsy of nasopharynx as outpt.  [place], Shields Pautz 06/29/2014, 7:28 AM

## 2014-07-04 ENCOUNTER — Ambulatory Visit (INDEPENDENT_AMBULATORY_CARE_PROVIDER_SITE_OTHER): Payer: Medicare HMO | Admitting: Family

## 2014-07-04 ENCOUNTER — Encounter: Payer: Self-pay | Admitting: Family

## 2014-07-04 VITALS — BP 109/72 | HR 86 | Temp 97.4°F | Ht 72.0 in | Wt 136.0 lb

## 2014-07-04 DIAGNOSIS — I10 Essential (primary) hypertension: Secondary | ICD-10-CM

## 2014-07-04 DIAGNOSIS — E038 Other specified hypothyroidism: Secondary | ICD-10-CM

## 2014-07-04 DIAGNOSIS — R221 Localized swelling, mass and lump, neck: Secondary | ICD-10-CM

## 2014-07-04 DIAGNOSIS — R220 Localized swelling, mass and lump, head: Secondary | ICD-10-CM

## 2014-07-04 NOTE — Progress Notes (Signed)
Subjective:    Patient ID: Brandon Hall, male    DOB: September 20, 1956, 58 y.o.   MRN: [id number]  HPI Pt presents to the office for follow-up for hypertension. Pt states he has been taking his blood pressure medication everyday. Pt denies any SOB, palpations, or edema at this time. Pt's blood pressure controlled today.  Pt has Prostate Ca with a large neck mass. Pt currently seeing Dr. Annalee Genta for neck mass.    Review of Systems  Constitutional: Negative.   HENT: Positive for facial swelling.   Respiratory: Negative.   Cardiovascular: Negative.   Gastrointestinal: Negative.   Endocrine: Negative.   Genitourinary: Negative.   Musculoskeletal: Negative.   Skin: Negative.   Neurological: Negative.   Hematological: Negative.   Psychiatric/Behavioral: Negative.   All other systems reviewed and are negative.      Objective:   Physical Exam  Vitals reviewed. Constitutional: He is oriented to person, place, and time. He appears well-developed and well-nourished. No distress.  Eyes: Pupils are equal, round, and reactive to light. Right eye exhibits no discharge. Left eye exhibits no discharge.  Neck:  Large left neck mass   Cardiovascular: Normal rate, regular rhythm, normal heart sounds and intact distal pulses.   No murmur heard. Pulmonary/Chest: Effort normal and breath sounds normal. No respiratory distress. He has no wheezes.  Abdominal: Soft. Bowel sounds are normal. He exhibits no distension. There is no tenderness.  Musculoskeletal: Normal range of motion. He exhibits no edema and no tenderness.  Neurological: He is alert and oriented to person, place, and time.  Skin: Skin is warm and dry. No rash noted. No erythema.  Psychiatric: He has a normal mood and affect. His behavior is normal. Judgment and thought content normal.    BP 109/72  Pulse 86  Temp(Src) 97.4 F (36.3 C) (Oral)  Ht 6' (1.829 m)  Wt 136 lb (61.689 kg)  BMI 18.44 kg/m2       Assessment & Plan:    1. Essential hypertension -Continue current medications -Low salt diet - RTO 3 months  2. Other specified hypothyroidism -PT currently taking levothyroxine 75 mcg- Will adjust according to blood work - Thyroid Panel With TSH  3. Neck mass -Keep all appointments with Oncologists and Dr. Annalee Genta (ENT)  Jannifer Rodney, FNP

## 2014-07-04 NOTE — Patient Instructions (Signed)
Hypothyroidism The thyroid is a large gland located in the lower front of your neck. The thyroid gland helps control metabolism. Metabolism is how your body handles food. It controls metabolism with the hormone thyroxine. When this gland is underactive (hypothyroid), it produces too little hormone.  CAUSES These include:   Absence or destruction of thyroid tissue.  Goiter due to iodine deficiency.  Goiter due to medications.  Congenital defects (since birth).  Problems with the pituitary. This causes a lack of TSH (thyroid stimulating hormone). This hormone tells the thyroid to turn out more hormone. SYMPTOMS  Lethargy (feeling as though you have no energy)  Cold intolerance  Weight gain (in spite of normal food intake)  Dry skin  Coarse hair  Menstrual irregularity (if severe, may lead to infertility)  Slowing of thought processes Cardiac problems are also caused by insufficient amounts of thyroid hormone. Hypothyroidism in the newborn is cretinism, and is an extreme form. It is important that this form be treated adequately and immediately or it will lead rapidly to retarded physical and mental development. DIAGNOSIS  To prove hypothyroidism, your caregiver may do blood tests and ultrasound tests. Sometimes the signs are hidden. It may be necessary for your caregiver to watch this illness with blood tests either before or after diagnosis and treatment. TREATMENT  Low levels of thyroid hormone are increased by using synthetic thyroid hormone. This is a safe, effective treatment. It usually takes about four weeks to gain the full effects of the medication. After you have the full effect of the medication, it will generally take another four weeks for problems to leave. Your caregiver may start you on low doses. If you have had heart problems the dose may be gradually increased. It is generally not an emergency to get rapidly to normal. HOME CARE INSTRUCTIONS   Take your  medications as your caregiver suggests. Let your caregiver know of any medications you are taking or start taking. Your caregiver will help you with dosage schedules.  As your condition improves, your dosage needs may increase. It will be necessary to have continuing blood tests as suggested by your caregiver.  Report all suspected medication side effects to your caregiver. SEEK MEDICAL CARE IF: Seek medical care if you develop:  Sweating.  Tremulousness (tremors).  Anxiety.  Rapid weight loss.  Heat intolerance.  Emotional swings.  Diarrhea.  Weakness. SEEK IMMEDIATE MEDICAL CARE IF:  You develop chest pain, an irregular heart beat (palpitations), or a rapid heart beat. MAKE SURE YOU:   Understand these instructions.  Will watch your condition.  Will get help right away if you are not doing well or get worse. Document Released: 12/15/2005 Document Revised: 03/08/2012 Document Reviewed: 08/04/2008 ExitCare Patient Information 2015 [employer]. This information is not intended to replace advice given to you by your health care provider. Make sure you discuss any questions you have with your health care provider.  

## 2014-07-05 LAB — THYROID PANEL WITH TSH
Free Thyroxine Index: 2.4 (ref 1.2–4.9)
T3 Uptake Ratio: 22 % — ABNORMAL LOW (ref 24–39)
T4 TOTAL: 10.7 ug/dL (ref 4.5–12.0)
TSH: 13.22 u[IU]/mL — ABNORMAL HIGH (ref 0.450–4.500)

## 2014-07-06 ENCOUNTER — Other Ambulatory Visit: Payer: Self-pay | Admitting: Family

## 2014-07-06 MED ORDER — LEVOTHYROXINE SODIUM 88 MCG PO TABS: 88.0000 ug | ORAL_TABLET | Freq: Every day | ORAL | Status: DC

## 2014-07-07 ENCOUNTER — Telehealth: Payer: Self-pay | Admitting: *Deleted

## 2014-07-07 NOTE — Telephone Encounter (Signed)
Call from Dr. Wilburn Cornelia,  He requests call back from Dr. Alvy Bimler on Monday regarding this pt.. Email sent to Dr. Alvy Bimler.

## 2014-07-10 ENCOUNTER — Telehealth: Payer: Self-pay | Admitting: Hematology and Oncology

## 2014-07-10 NOTE — Telephone Encounter (Signed)
S/W PATIENT/WIFE([name]) AND GAVE NP APPT FOR 07/15 @ 1 W/DR. [place].

## 2014-07-11 ENCOUNTER — Telehealth: Payer: Self-pay | Admitting: Hematology and Oncology

## 2014-07-11 ENCOUNTER — Encounter: Payer: Self-pay | Admitting: Radiation Oncology

## 2014-07-11 NOTE — Progress Notes (Signed)
Head and Neck Cancer Location of Tumor / Histology: Left  Neck Node - Squamous Cell Carcinoma  Patient presented for an evaluation with a left lateral neck mass.    PET Scan 1. 5.6 x 5.1 cm left cervical soft tissue mass demonstrates  peripheral hypermetabolism and central  low-attenuation/hypometabolism, compatible with a malignant  centrally necrotic level 5A lymph node in this patient with history  of biopsy-proven squamous cell carcinoma.  2. There is slight asymmetry of soft tissue and metabolic activity  in the posterior aspect of the nasopharynx on the left side where  there is a small focus of soft tissue thickening and hypermetabolism  (SUVmax = 4.2). Clinical correlation for evidence of primary  nasopharyngeal carcinoma in this region is suggested.  3. Symmetric hypermetabolism in the parotid glands bilaterally  without definite focal soft tissue mass. This favored to be  physiologic.    Biopsies of Nasopharynx (if applicable) revealed:   06/29/14   Diagnosis 1. Nasopharynx, biopsy, Right superior - INFLAMED SQUAMOUS LINED MUCOSA. - THERE IS NO EVIDENCE OF MALIGNANCY. 2. Nasopharynx, biopsy, Right inferior - INFLAMED SQUAMOUS LINED MUCOSA. - THERE IS NO EVIDENCE OF MALIGNANCY. 3. Nasopharynx, biopsy, Left superior - INFLAMED SQUAMOUS LINED MUCOSA. - THERE IS NO EVIDENCE OF MALIGNANCY. 4. Nasopharynx, biopsy, Left inferior - INFLAMED SQUAMOUS LINED MUCOSA. - THERE IS NO EVIDENCE OF MALIGNANCY. 5. Nasopharynx, biopsy, Central - INFLAMED SQUAMOUS LINED MUCOSA. - THERE IS NO EVIDENCE OF MALIGNANCY   Nutrition Status:  Weight changes: No weight loss  Swallowing status: NO.  Takes his time eating  Plans, if any, for PEG tube:  Tobacco/Marijuana/Snuff/ETOH use: reports that he quit smoking about 8 months ago. His smoking use included Cigarettes. He has a 40 pack-year smoking history. He has never used smokeless tobacco. He reports that he does not drink alcohol or  use illicit drugs.  Past/Anticipated interventions by otolaryngology, if any: Dr. Jerrell Belfast: Biopsy of the Nasopharynx  Past/Anticipated interventions by medical oncology, if any:  Appointment with Dr. Heath Lark on 07/12/14   Referrals yet, to any of the following?  Social Work?  Dentistry?   Swallowing therapy?   Nutrition? 07/12/14  Med/Onc? 07/12/14  PEG placement?   SAFETY ISSUES:  Prior radiation? Prostate Cancer - Radiation several years ago at [facility]  Pacemaker/ICD? No  Possible current pregnancy? No  Is the patient on methotrexate? No  Current Complaints / other details:

## 2014-07-11 NOTE — Telephone Encounter (Signed)
S/W PATIENT WIFE([name]) AND GAVE NEW TIME FOR APPT ON 07/15 12:15, PATIENT WIFE CONFIRMED APPTS.

## 2014-07-12 ENCOUNTER — Ambulatory Visit
Admission: RE | Admit: 2014-07-12 | Discharge: 2014-07-12 | Disposition: A | Discharge status: Home / Self Care | Payer: Medicare HMO | Source: Ambulatory Visit | Attending: Radiation Oncology | Admitting: Radiation Oncology

## 2014-07-12 ENCOUNTER — Encounter: Payer: Self-pay | Admitting: Hematology and Oncology

## 2014-07-12 ENCOUNTER — Encounter: Payer: Self-pay | Admitting: Radiation Oncology

## 2014-07-12 ENCOUNTER — Ambulatory Visit (HOSPITAL_BASED_OUTPATIENT_CLINIC_OR_DEPARTMENT_OTHER): Payer: Medicare HMO | Admitting: Hematology and Oncology

## 2014-07-12 ENCOUNTER — Encounter: Payer: Self-pay | Admitting: *Deleted

## 2014-07-12 ENCOUNTER — Ambulatory Visit: Payer: Medicare HMO | Admitting: Nutrition

## 2014-07-12 VITALS — BP 147/99 | HR 84 | Temp 97.7°F | Resp 18 | Ht 72.0 in | Wt 138.1 lb

## 2014-07-12 DIAGNOSIS — Z923 Personal history of irradiation: Secondary | ICD-10-CM | POA: Insufficient documentation

## 2014-07-12 DIAGNOSIS — I10 Essential (primary) hypertension: Secondary | ICD-10-CM | POA: Insufficient documentation

## 2014-07-12 DIAGNOSIS — Z87891 Personal history of nicotine dependence: Secondary | ICD-10-CM | POA: Diagnosis not present

## 2014-07-12 DIAGNOSIS — Z510 Encounter for antineoplastic radiation therapy: Secondary | ICD-10-CM | POA: Insufficient documentation

## 2014-07-12 DIAGNOSIS — C770 Secondary and unspecified malignant neoplasm of lymph nodes of head, face and neck: Secondary | ICD-10-CM

## 2014-07-12 DIAGNOSIS — F1010 Alcohol abuse, uncomplicated: Secondary | ICD-10-CM | POA: Diagnosis not present

## 2014-07-12 DIAGNOSIS — C61 Malignant neoplasm of prostate: Secondary | ICD-10-CM

## 2014-07-12 DIAGNOSIS — E039 Hypothyroidism, unspecified: Secondary | ICD-10-CM | POA: Insufficient documentation

## 2014-07-12 DIAGNOSIS — Z8546 Personal history of malignant neoplasm of prostate: Secondary | ICD-10-CM | POA: Diagnosis not present

## 2014-07-12 DIAGNOSIS — C760 Malignant neoplasm of head, face and neck: Secondary | ICD-10-CM | POA: Insufficient documentation

## 2014-07-12 DIAGNOSIS — C4442 Squamous cell carcinoma of skin of scalp and neck: Secondary | ICD-10-CM

## 2014-07-12 DIAGNOSIS — C801 Malignant (primary) neoplasm, unspecified: Secondary | ICD-10-CM

## 2014-07-12 HISTORY — DX: Malignant neoplasm of prostate: C61

## 2014-07-12 HISTORY — DX: Malignant neoplasm of nasopharynx, unspecified: C11.9

## 2014-07-12 HISTORY — DX: Malignant neoplasm of head, face and neck: C76.0

## 2014-07-12 HISTORY — DX: Squamous cell carcinoma of skin of scalp and neck: C44.42

## 2014-07-12 NOTE — Progress Notes (Signed)
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 male is here because of newly diagnosed squamous cell carcinoma. According to the patient, the first initial presentation was due to palpable lump on the left side of his neck extending around April of this year. Since then, it has gotten larger. he denies any hearing deficit, difficulties with chewing food, swallowing difficulties, painful swallowing, changes in the quality of his voice or abnormal weight loss. I reviewed his records extensively and summarized his oncologic history is as follows: Oncology History   Secondary and unspecified malignant neoplasm of lymph nodes of head, face, and neck, likely nasopharynx in origin   Primary site: Pharynx - Nasopharynx   Staging method: AJCC 7th Edition   Clinical: Stage II (T1, N1, M0)   Summary: Stage II (T1, N1, M0)       Secondary and unspecified malignant neoplasm of lymph nodes of head, face, and neck   05/05/2014 Imaging CT scan of the neck show enlarged left lymph node measure less than 6 cm.   05/25/2014 Pathology [id number] is positive for squamous cell carcinoma.   05/25/2014 Procedure Lymph node biopsy of the left neck came back squamous cell carcinoma.   06/20/2014 Imaging PET CT scan show no evidence of distant metastatic disease.   06/29/2014 Surgery Laryngoscopy and random biopsy of the nasopharynx was negative for malignancy.   The patient also has a history of prostate cancer diagnosed many years ago. This year, he has rising PSA and was treated with cryoablation. He  have nocturnal 3 times at night. He denies any urinary difficulties or painful urination.  MEDICAL HISTORY:  Past Medical History  Diagnosis Date  . Graves disease   . Hypothyroidism, postradioiodine therapy   . History of shingles     RASH  02-03-2014  PER PCP RESOLVING  . Prostate cancer     DX  SEVERAL  YRS   AGO  WITH RADIATION THERAPY  . Cataract, secondary obscuring vision     LEFT  . Blindness of right eye   . Hypertension     NO MEDS ,  MONITORED BY PCP  . Arthritis   . 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 Cancer     pharyngeal ca    SURGICAL HISTORY: Past Surgical History  Procedure Laterality Date  . Colonoscopy  10/20/2011    Procedure: COLONOSCOPY;  Surgeon: Daneil Dolin, MD;  Location: [facility];  Service: Endoscopy;  Laterality: N/A;  1:00 pm  . Cryoablation N/A 04/07/2014    Procedure: SALVAGE CRYO ABLATION PROSTATE;  Surgeon: Lowella Bandy, MD;  Location: [facility];  Service: Urology;  Laterality: N/A;  . Laryngoscopy    . Lymph node biopsy      SOCIAL HISTORY: History   Social History  . Marital Status: Married    Spouse Name: N/A    Number of Children: N/A  . Years of Education: N/A   Occupational History  . Not on file.   Social History Main Topics  . Smoking status: Former Smoker -- 1.00 packs/day for 40 years    Types: Cigarettes    Quit  date: 09/29/2013  . Smokeless tobacco: Never Used  . Alcohol Use: No  . Drug Use: No  . Sexual Activity: Not on file   Other Topics Concern  . Not on file   Social History Narrative  . No narrative on file    FAMILY HISTORY: Family History  Problem Relation Age of Onset  . Colon cancer Father   . Hypertension Mother     ALLERGIES:  has No Known Allergies.  MEDICATIONS:  Current Outpatient Prescriptions  Medication Sig Dispense Refill  . hydrochlorothiazide (HYDRODIURIL) 12.5 MG tablet Take 1 tablet (12.5 mg total) by mouth daily.  90 tablet  3  . levothyroxine  (SYNTHROID, LEVOTHROID) 88 MCG tablet Take 1 tablet (88 mcg total) by mouth daily.  90 tablet  3   No current facility-administered medications for this visit.    REVIEW OF SYSTEMS:   Constitutional: Denies fevers, chills or abnormal night sweats Eyes: Denies blurriness of vision, double vision or watery eyes. He is blind in the right eye Ears, nose, mouth, throat, and face: Denies mucositis or sore throat Respiratory: Denies cough, dyspnea or wheezes Cardiovascular: Denies palpitation, chest discomfort or lower extremity swelling Gastrointestinal:  Denies nausea, heartburn or change in bowel habits Skin: Denies abnormal skin rashes Neurological:Denies numbness, tingling or new weaknesses Behavioral/Psych: Mood is stable, no new changes  All other systems were reviewed with the patient and are negative.  PHYSICAL EXAMINATION: ECOG PERFORMANCE STATUS: 1 - Symptomatic but completely ambulatory  Filed Vitals:   07/12/14 1241  BP: 147/99  Pulse: 84  Temp: 97.7 F (36.5 C)  Resp: 18   Filed Weights   07/12/14 1241  Weight: 138 lb 1.6 oz (62.642 kg)    GENERAL:alert, no distress and comfortable. He looks thin SKIN: skin color, texture, turgor are normal, no rashes or significant lesions EYES: normal, conjunctiva are pink and non-injected, sclera clear OROPHARYNX:no exudate, no erythema and lips, buccal mucosa, and tongue normal . He has no teeth NECK: supple, thyroid normal size, non-tender, without nodularity LYMPH:  Significant lymphadenopathy on the left of his neck measure almost 6 cm in size. LUNGS: clear to auscultation and percussion with normal breathing effort HEART: regular rate & rhythm and no murmurs and no lower extremity edema ABDOMEN:abdomen soft, non-tender and normal bowel sounds Musculoskeletal:no cyanosis of digits and no clubbing  PSYCH: alert & oriented x 3 with fluent speech NEURO: no focal motor/sensory deficits  LABORATORY DATA:  I have reviewed the  data as listed Lab Results  Component Value Date   WBC 5.4 06/23/2014   HGB 15.5 06/23/2014   HCT 48.7 06/23/2014   MCV 84.5 06/23/2014   PLT 176 06/23/2014   Lab Results  Component Value Date   NA 139 06/23/2014   K 4.5 06/23/2014   CL 97 06/23/2014   CO2 27 06/23/2014    RADIOGRAPHIC STUDIES: I. reviewed all his imaging studies myself and during tumor board meeting this morning. I have personally reviewed the radiological images as listed and agreed with the findings in the report. Dg Chest 2 View  06/23/2014   CLINICAL DATA:  Hypertension.  Preop.  Left neck mass.  EXAM: CHEST  2 VIEW  COMPARISON:  PET-CT 06/20/2014.  Chest radiographs 10/02/2011.  FINDINGS: Cardiomediastinal silhouette is within normal limits. The lungs remain hyperinflated. There is no evidence of airspace consolidation, pleural effusion, or pneumothorax. Bulky right-sided osteophyte is again seen at T8-9.  IMPRESSION: No active cardiopulmonary disease.   Electronically Signed   By:  Logan Bores   On: 06/23/2014 15:37   Nm Pet Image Initial (pi) Skull Base To Thigh  06/20/2014   CLINICAL DATA:  Initial treatment strategy for head and neck cancer.  EXAM: NUCLEAR MEDICINE PET SKULL BASE TO THIGH  TECHNIQUE: 8.1 mCi F-18 FDG was injected intravenously. Full-ring PET imaging was performed from the skull base to thigh after the radiotracer. CT data was obtained and used for attenuation correction and anatomic localization.  FASTING BLOOD GLUCOSE:  Value: 101 mg/dl  COMPARISON:  None.  FINDINGS: NECK  Immediately posterior to the sternocleidomastoid muscle there is a 5.6 x 5.1 cm mass that has central low attenuation and peripheral hypermetabolism (SUVmax = 8.5)with central hypometabolism, favored to represent a malignant centrally necrotic level 5A lymph node. In the posterior aspect of the nasopharynx on the left side there is some asymmetric amorphous soft tissue thickening best appreciated on image 14 of series 4, which corresponds  to some asymmetric hypermetabolism (SUVmax = 4.2) suspicious for potential primary neoplasm. There is also some hypermetabolism in the the posterior aspect of the suboccipital regions bilaterally (SUVmax = 3.3 on the right and 5.7 on the left), which does not appear to correspond to a soft tissue mass or nodal tissue, but rather is favored to be physiologic activity within the rectus capitis muscles. There is also relatively symmetric hypermetabolism throughout the parotid glands bilaterally (SUVmax = 6.0 on the right and 5.8 on the left), favored to be physiologic.  CHEST  No hypermetabolic mediastinal or hilar nodes. 5 mm right middle lobe nodule (image 56 of series 7) demonstrates no definite hypermetabolism (nodules of this size are commonly not hypermetabolic even if malignant). Mild paraseptal and centrilobular emphysema. Heart size is normal. There is no significant pericardial fluid, thickening or pericardial calcification. There is atherosclerosis of the thoracic aorta, the great vessels of the mediastinum and the coronary arteries, including calcified atherosclerotic plaque in the left main, left anterior descending and left circumflex coronary arteries.  ABDOMEN/PELVIS  No abnormal hypermetabolic activity within the liver, pancreas, adrenal glands, or spleen. No hypermetabolic lymph nodes in the abdomen. There are 2 small exophytic low-attenuation lesions in the upper pole of the right kidney, largest of which measures 2.8 cm, which demonstrate no internal hypermetabolism, which are similar to prior examinations, favored to represent small cysts. A larger 2.6 cm low-attenuation non hypermetabolic lesion in the lower pole of the right kidney also likely represents a cyst. Atherosclerosis throughout the it abdominal and pelvic vasculature, without definite aneurysm. No significant volume of ascites. No pneumoperitoneum. No pathologic distention of small bowel. Left inguinal lymph nodes are noted  demonstrating low-level hypermetabolism. Specific examples include a 7 mm short axis left inguinal lymph node (image 175 of series 4), and a 8 mm short axis left inguinal lymph node (image 187 of series 4), which are both hypermetabolic (SUVmax = 3.2 and 4.2 respectively).  SKELETON  No focal hypermetabolic activity to suggest skeletal metastasis.  IMPRESSION: 1. 5.6 x 5.1 cm left cervical soft tissue mass demonstrates peripheral hypermetabolism and central low-attenuation/hypometabolism, compatible with a malignant centrally necrotic level 5A lymph node in this patient with history of biopsy-proven squamous cell carcinoma. 2. There is slight asymmetry of soft tissue and metabolic activity in the posterior aspect of the nasopharynx on the left side where there is a small focus of soft tissue thickening and hypermetabolism (SUVmax = 4.2). Clinical correlation for evidence of primary nasopharyngeal carcinoma in this region is suggested. 3. Symmetric hypermetabolism in the  parotid glands bilaterally without definite focal soft tissue mass. This favored to be physiologic. 4. Nonenlarged left inguinal lymph nodes demonstrate low level hypermetabolic activity, as discussed above. This would be an unusual site of metastatic disease for primary head neck cancer, and is favored to be reactive. Clinical correlation is recommended, and attention on followup studies is suggested to ensure stability. 5. 5 mm right middle lobe nodule is nonspecific, but is unchanged compared to prior study 12/27/2013 and therefore favored to be benign. However, continued attention on followup studies is recommended. 6. Mild centrilobular and paraseptal emphysema. 7. Atherosclerosis, including left main and 2 vessel coronary artery disease. Please note that although the presence of coronary artery calcium documents the presence of coronary artery disease, the severity of this disease and any potential stenosis cannot be assessed on this non-gated  CT examination. Assessment for potential risk factor modification, dietary therapy or pharmacologic therapy may be warranted, if clinically indicated. 8. Additional incidental findings, as above.   Electronically Signed   By: Vinnie Langton M.D.   On: 06/20/2014 09:33    ASSESSMENT:  Newly diagnosed squamous cell carcinoma of the Head & Neck, HPV N/A  PLAN:  Secondary and unspecified malignant neoplasm of lymph nodes of head, face, and neck We had a long discussion at the ENT tumor board this morning. Due to the size of the tumor, I recommended induction chemotherapy followed by radiation treatment. The patient wants to be treated closer to home. I will refer him to the [facility] in [place] and would defer to them about chemotherapy decision. Alternative would be treat him with concurrent chemoradiation therapy with high-dose cisplatin.  History of prostate cancer This patient had history of prostate cancer treated by his urologist recently. I would defer to them for further followup.     All questions were answered. The patient knows to call the clinic with any problems, questions or concerns. I spent 55 minutes counseling the patient face to face. The total time spent in the appointment was 60 minutes and more than 50% was on counseling.     [facility], Mounir Skipper, MD 07/12/2014 2:04 PM

## 2014-07-12 NOTE — Progress Notes (Signed)
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 male who noticed a left neck mass in April 2015. He has no pain except in his L neck at night. PMH positive for distant ETOH abuse, many years of tobacco abuse, and prostate cancer.  Denies skin cancers.  His prostate cancer was treated several years ago with RT and then salvaged with cryoablation in April.  CT of neck on 05/05/14 showed a "predominantly cystic mass in the left neck corresponding to the palpable area of concern, 3-3.5 cm diameter.Marland KitchenMarland KitchenMarland KitchenSubtle hyper enhancement in the central right cerebellar hemisphere. This might be a small benign vascular malformation, but recommend followup brain MRI in this setting to further characterize."  Biopsy of the node on 5-28 by Dr Annalee Genta revealed squamous cell carcinoma.  PET on 6-23 showed 5.6 x 5.1 cm left cervical soft tissue mass demonstrates  peripheral hypermetabolism and central low-attenuation/hypometabolism, compatible with a malignant centrally necrotic level 5A lymph node in this patient with history of biopsy-proven squamous cell carcinoma...   There is slight asymmetry of soft tissue and metabolic activity  in the posterior aspect of the nasopharynx on the left side where there is a small focus of soft tissue thickening and hypermetabolism (SUVmax = 4.2).   IT SHOULD BE NOTED THAT UPON DISCUSSION AT TUMOR BOARD TODAY, RADIOLOGY FAVORS THAT THIS AREA IS ACTUALLY IN THE LONGUS CAPITUS MUSCLES, NOT NASOPHARYNX.  Congruent with this assessment at tumor board, all 5 biopsies of the nasopharynx on 06-29-14 were negative.  Laryngoscopy by Dr  Annalee Genta showed no lesions in the mucosal axis suggestive of cancer. THEREFORE, THIS IS FELT TO BE HEAD AND NECK CANCER OF UNKNOWN PRIMARY.  THIS SHOULD BE EMPHASIZED AS CERTAIN RECORDS IN THE PATIENT'S CHART STILL REFER TO THIS AS A NASOPHARYNGEAL CANCER, AND THAT IS NOT CORRECT.  No weight loss. No swallowing problems, no otalgia, no odynophagia. He is edentulous. He lives in [place].   He saw Dr. Bertis Ruddy who recommended induction chemotherapy as discussed at tumor board today. He wishes to get chemotherapy closer to home and will see a provider at [facility] on 7-20.   Nutrition Status:  Weight changes: No weight loss  Swallowing status: NO. Takes his time eating  Seeing nutritionist today  Tobacco/Marijuana/Snuff/ETOH use: reports that he quit smoking about 8 months ago. His smoking use included Cigarettes. He has a 40+pack-year smoking history. He has never used smokeless tobacco. He reports that he does not drink alcohol or use illicit drugs. Prior ETOH abuse years ago.   PREVIOUS RADIATION THERAPY: Yes - per notes by Dr Brunilda Payor, external beam radiation therapy in 2006 for adenocarcinoma of prostate. This year, primary care physician requested a PSA that was elevated at 10. Repeat PSA on 11/08/2013 was 12.16. Prostate biopsy revealed Gleason 4+3, 3+4 and 3+3 in 8 out of 12 cores. Bone scan showed no evidence of bony metastasis. CT scan abdomen and pelvis showed no evidence of pelvic or retroperitoneal adenopathy. The patient  in April had salvage cryoablation. 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    PAST SURGICAL HISTORY: Past Surgical History  Procedure  Laterality Date  . Colonoscopy  10/20/2011    Procedure: COLONOSCOPY;  Surgeon: Corbin Ade, MD;  [facility];  Service: Endoscopy;  Laterality: N/A;  1:00 pm  . Cryoablation N/A 04/07/2014    Procedure: SALVAGE CRYO ABLATION PROSTATE;  Surgeon: Su Grand, MD;  Location: [facility];  Service: Urology;  Laterality: N/A;  . Laryngoscopy    . Lymph node biopsy      FAMILY HISTORY: family history includes Colon cancer in his father; Hypertension in his mother.  SOCIAL HISTORY:  reports that he quit smoking about 9 months ago. His smoking use included Cigarettes. He has a 40 pack-year smoking history. He has never used smokeless tobacco. He reports that he does not drink alcohol or use illicit drugs.  ALLERGIES: Review of patient's allergies indicates no known allergies.  MEDICATIONS:  Current Outpatient Prescriptions  Medication Sig Dispense Refill  . hydrochlorothiazide (HYDRODIURIL) 12.5 MG tablet Take 1 tablet (12.5 mg total) by mouth daily.  90 tablet  3  . levothyroxine (SYNTHROID, LEVOTHROID) 88 MCG tablet Take 1 tablet (88 mcg total) by mouth daily.  90 tablet  3   No current facility-administered medications for this encounter.    REVIEW OF SYSTEMS:  Notable for that above.   PHYSICAL EXAM:   Vitals - 1 value per visit 07/12/2014  SYSTOLIC 147  DIASTOLIC 99  Pulse 84  Temperature 97.7  Respirations 18  Weight (lb) 138.1  Height 6\' 0"   BMI 18.73  VISIT REPORT    General: Alert and oriented, in no acute distress. Appears older than stated age. HEENT: Head is normocephalic. Right eye - blind and opacified. Oropharynx is clear. Edentulous Neck: >6cm node in left neck, no skin erosion.  Lipoma of right submental region. No other masses. Heart: Regular in rate and rhythm with no murmurs, rubs, or gallops. Chest: Clear to auscultation bilaterally, with no rhonchi, wheezes, or rales. Abdomen: Soft, nontender, nondistended, with no rigidity or  guarding. Extremities: No cyanosis or edema. Lymphatics: as above. Skin: No concerning lesions. Musculoskeletal: symmetric strength and muscle tone throughout. Neurologic: No obvious focalities. Speech is fluent. Coordination is intact. Psychiatric: Judgment and insight are intact. Affect is appropriate.   ECOG = 2  0 - Asymptomatic (Fully active, able to carry on all predisease activities without restriction)  1 - Symptomatic but completely ambulatory (Restricted in physically strenuous activity but ambulatory and able to carry out work of a light or sedentary nature. For example, light housework, office work)  2 - Symptomatic, <50% in bed during the day (Ambulatory and capable of all self care but unable to carry out any work activities. Up and about more than 50% of waking hours)  3 - Symptomatic, >50% in bed, but not bedbound (Capable of only limited self-care, confined to bed or chair 50% or more of waking hours)  4 - Bedbound (Completely disabled. Cannot carry on any self-care. Totally confined to bed or chair)  5 - Death   Santiago Glad MM, Creech RH, Tormey DC, et al. [phone]). "Toxicity and response criteria of the [facility]". Am. Evlyn Clines. Oncol. 5 (6): 649-55   LABORATORY DATA:  Lab Results  Component Value Date   WBC 5.4 06/23/2014   HGB 15.5 06/23/2014  HCT 48.7 06/23/2014   MCV 84.5 06/23/2014   PLT 176 06/23/2014   CMP     Component Value Date/Time   NA 139 06/23/2014 1438   NA 140 05/01/2014 0958   K 4.5 06/23/2014 1438   CL 97 06/23/2014 1438   CO2 27 06/23/2014 1438   GLUCOSE 100* 06/23/2014 1438   GLUCOSE 80 05/01/2014 0958   BUN 12 06/23/2014 1438   BUN 12 05/01/2014 0958   CREATININE 0.94 06/23/2014 1438   CALCIUM 9.8 06/23/2014 1438   PROT 7.2 05/01/2014 0958   AST 16 05/01/2014 0958   ALT 10 05/01/2014 0958   ALKPHOS 75 05/01/2014 0958   BILITOT 0.4 05/01/2014 0958   GFRNONAA 90* 06/23/2014 1438   GFRAA >90 06/23/2014 1438      Lab Results  Component  Value Date   TSH 13.220* 07/04/2014      RADIOGRAPHY: Dg Chest 2 View  06/23/2014   CLINICAL DATA:  Hypertension.  Preop.  Left neck mass.  EXAM: CHEST  2 VIEW  COMPARISON:  PET-CT 06/20/2014.  Chest radiographs 10/02/2011.  FINDINGS: Cardiomediastinal silhouette is within normal limits. The lungs remain hyperinflated. There is no evidence of airspace consolidation, pleural effusion, or pneumothorax. Bulky right-sided osteophyte is again seen at T8-9.  IMPRESSION: No active cardiopulmonary disease.   Electronically Signed   By: Sebastian Ache   On: 06/23/2014 15:37   Nm Pet Image Initial (pi) Skull Base To Thigh  06/20/2014   CLINICAL DATA:  Initial treatment strategy for head and neck cancer.  EXAM: NUCLEAR MEDICINE PET SKULL BASE TO THIGH  TECHNIQUE: 8.1 mCi F-18 FDG was injected intravenously. Full-ring PET imaging was performed from the skull base to thigh after the radiotracer. CT data was obtained and used for attenuation correction and anatomic localization.  FASTING BLOOD GLUCOSE:  Value: 101 mg/dl  COMPARISON:  None.  FINDINGS: NECK  Immediately posterior to the sternocleidomastoid muscle there is a 5.6 x 5.1 cm mass that has central low attenuation and peripheral hypermetabolism (SUVmax = 8.5)with central hypometabolism, favored to represent a malignant centrally necrotic level 5A lymph node. In the posterior aspect of the nasopharynx on the left side there is some asymmetric amorphous soft tissue thickening best appreciated on image 14 of series 4, which corresponds to some asymmetric hypermetabolism (SUVmax = 4.2) suspicious for potential primary neoplasm. There is also some hypermetabolism in the the posterior aspect of the suboccipital regions bilaterally (SUVmax = 3.3 on the right and 5.7 on the left), which does not appear to correspond to a soft tissue mass or nodal tissue, but rather is favored to be physiologic activity within the rectus capitis muscles. There is also relatively symmetric  hypermetabolism throughout the parotid glands bilaterally (SUVmax = 6.0 on the right and 5.8 on the left), favored to be physiologic.  CHEST  No hypermetabolic mediastinal or hilar nodes. 5 mm right middle lobe nodule (image 56 of series 7) demonstrates no definite hypermetabolism (nodules of this size are commonly not hypermetabolic even if malignant). Mild paraseptal and centrilobular emphysema. Heart size is normal. There is no significant pericardial fluid, thickening or pericardial calcification. There is atherosclerosis of the thoracic aorta, the great vessels of the mediastinum and the coronary arteries, including calcified atherosclerotic plaque in the left main, left anterior descending and left circumflex coronary arteries.  ABDOMEN/PELVIS  No abnormal hypermetabolic activity within the liver, pancreas, adrenal glands, or spleen. No hypermetabolic lymph nodes in the abdomen. There are 2 small exophytic low-attenuation lesions  in the upper pole of the right kidney, largest of which measures 2.8 cm, which demonstrate no internal hypermetabolism, which are similar to prior examinations, favored to represent small cysts. A larger 2.6 cm low-attenuation non hypermetabolic lesion in the lower pole of the right kidney also likely represents a cyst. Atherosclerosis throughout the it abdominal and pelvic vasculature, without definite aneurysm. No significant volume of ascites. No pneumoperitoneum. No pathologic distention of small bowel. Left inguinal lymph nodes are noted demonstrating low-level hypermetabolism. Specific examples include a 7 mm short axis left inguinal lymph node (image 175 of series 4), and a 8 mm short axis left inguinal lymph node (image 187 of series 4), which are both hypermetabolic (SUVmax = 3.2 and 4.2 respectively).  SKELETON  No focal hypermetabolic activity to suggest skeletal metastasis.  IMPRESSION: 1. 5.6 x 5.1 cm left cervical soft tissue mass demonstrates peripheral hypermetabolism  and central low-attenuation/hypometabolism, compatible with a malignant centrally necrotic level 5A lymph node in this patient with history of biopsy-proven squamous cell carcinoma. 2. There is slight asymmetry of soft tissue and metabolic activity in the posterior aspect of the nasopharynx on the left side where there is a small focus of soft tissue thickening and hypermetabolism (SUVmax = 4.2). Clinical correlation for evidence of primary nasopharyngeal carcinoma in this region is suggested. 3. Symmetric hypermetabolism in the parotid glands bilaterally without definite focal soft tissue mass. This favored to be physiologic. 4. Nonenlarged left inguinal lymph nodes demonstrate low level hypermetabolic activity, as discussed above. This would be an unusual site of metastatic disease for primary head neck cancer, and is favored to be reactive. Clinical correlation is recommended, and attention on followup studies is suggested to ensure stability. 5. 5 mm right middle lobe nodule is nonspecific, but is unchanged compared to prior study 12/27/2013 and therefore favored to be benign. However, continued attention on followup studies is recommended. 6. Mild centrilobular and paraseptal emphysema. 7. Atherosclerosis, including left main and 2 vessel coronary artery disease. Please note that although the presence of coronary artery calcium documents the presence of coronary artery disease, the severity of this disease and any potential stenosis cannot be assessed on this non-gated CT examination. Assessment for potential risk factor modification, dietary therapy or pharmacologic therapy may be warranted, if clinically indicated. 8. Additional incidental findings, as above.   Electronically Signed   By: Trudie Reed M.D.   On: 06/20/2014 09:33   PATHOLOGY: LYMPH NODE, FINE NEEDLE ASPIRATION, LEFT NECK (SPECIMEN 1 OF 1, COLLECTED ON 05/25/14): MALIGNANT CELLS CONSISTENT WITH SQUAMOUS CELL  CARCINOMA.  05-25-14 Diagnosis Lymph node, needle/core biopsy, L Neck - SQUAMOUS CELL CARCINOMA. Microscopic Comment There is a small amount of lymphoid tissue present and therefore this likely represents metastatic squamous cell carcinoma.  06-29-14 1. Nasopharynx, biopsy, Right superior - INFLAMED SQUAMOUS LINED MUCOSA. - THERE IS NO EVIDENCE OF MALIGNANCY. 2. Nasopharynx, biopsy, Right inferior - INFLAMED SQUAMOUS LINED MUCOSA. - THERE IS NO EVIDENCE OF MALIGNANCY. 3. Nasopharynx, biopsy, Left superior - INFLAMED SQUAMOUS LINED MUCOSA. - THERE IS NO EVIDENCE OF MALIGNANCY. 4. Nasopharynx, biopsy, Left inferior - INFLAMED SQUAMOUS LINED MUCOSA. - THERE IS NO EVIDENCE OF MALIGNANCY. 5. Nasopharynx, biopsy, Central - INFLAMED SQUAMOUS LINED MUCOSA. - THERE IS NO EVIDENCE OF MALIGNANCY.     IMPRESSION/PLAN: This is a delightful 58 year old man with TxN3M0 squamous cell carcinoma of unknown primary,  HPV status pending, prior ETOH abuse / smoking history. He is an excellent candidate for  radiotherapy. This could be given after  induction chemotherapy as discussed at tumor board.  But, if induction chemotherapy is not recommended by the Jeani Hawking MD, I could simulate him for RT at [facility] on 7/22 to start therapy ASAP. Plan is as below:   1) He saw med/onc today to discuss chemotherapy. We had a long discussion at the ENT tumor board this morning. Due to the size of the tumor, Dr Bertis Ruddy recommended induction chemotherapy followed by radiation treatment.   The patient wants to be treated closer to home.  Dr Bertis Ruddy will refer him to the cancer Center in [place] and would defer to them about chemotherapy decision.  Alternative would be treat him with concurrent chemoradiation therapy with high-dose cisplatin. I will contact Dr Zigmund Daniel to discuss.  1a) Brain MRI will be ordered to work up R cerebellar lesion on CT scan and rule out metastasis.  2) Will not refer to  dentistry as he is edentulous and even if he has impacted teeth, I do not think he can afford delaying therapy to explore this.  3) Will refer to social work for social support  - will see Social worker today  4) Will refer to nutrition for nutrition support  - will see today  5) Medical Oncology may eventually refer to surgery for PEG tube placement. This is depending on chemotherapy plans, if any.  If he gets sequential chemotherapy --> RT, he could possibly avoid this.    6) Will eventually refer to swallowing therapy for dysphagia prevention. For now, I do not want to overwhelm him with appointments.   7) Simulation once disposition elucidated with med/onc. Anticipate 7 weeks of RT - 70 Gy in 35 fractions.  I anticipate treatment at [facility] will be best for him given his residence in [place] and social stressors/transportation issues.  He is amenable to this.  8) Baseline TSH is elevated.  Continue Levothyroxine as recently titrated up by PCP.   It was a pleasure meeting the patient today. We discussed the risks, benefits, and side effects of adjuvant radiotherapy.  No guarantees of treatment were given.The patient is enthusiastic about proceeding with treatment. I look forward to participating in the patient's care.   __________________________________________   Lonie Peak, MD

## 2014-07-12 NOTE — Assessment & Plan Note (Signed)
This patient had history of prostate cancer treated by his urologist recently. I would defer to them for further followup.

## 2014-07-12 NOTE — Progress Notes (Signed)
[facility] Clinical Social Work  Clinical Social Work met briefly with pt, his wife and daughter at [facility] for assessment of psychosocial needs.  Clinical Social Worker explained role of CSW and Patient and Family Support Center. Pt declined to complete Distress Screen. CSW shared with pt common concerns to pt's that are newly diagnosed with cancer and how CSW can support them. Pt denied current concerns or issues. Family denied concerns as well.  Pt agrees to seek CSW support as needed.     Grier , LCSW Clinical Social Worker [facility] Wednesday, Thursday and Friday Phone: [phone] Fax: [phone]       

## 2014-07-12 NOTE — Progress Notes (Signed)
58 year old male diagnosed with Left lateral neck mass/nasopharyngeal cancer.  He is a patient of Dr. Isidore Moos.  Past medical history includes:  Graves Disease, hypothyroidism, shingles, Prostate cancer, Blind right eye, HTN, Arthritis, tobacco.  Medications include synthroid.  Labs include Glucose of 100 on June 26.  Height:  72 inches. Weight: 138.1 pounds. UBW:  142 pounds on May 16, 2014. BMI:  18.73.  Patient has difficulty swallowing but states he just eats slow.  Denies weight loss.  Wife cooks for him and was present for nutrition education.  Patient does not drink milk but likes ice cream and cheese.  Possible treatment at [facility].  Nutrition Diagnosis:  Predicted suboptimal energy intake related to left neck mass as evidenced by patients with history of this condition for which research shows inadequate oral intake.  Intervention:  Educated on strategies for chopping and blenderizing foods for easier swallowing.  Encouraged sauces and gravies as needed.  Recommended small, frequent meals and snacks.  Educated patient and family on protein foods and gave examples of possible snacks.  Gave samples of oral nutrition supplements. Provided fact sheets and contact information.  Questions were answered and teach back method used.  Monitoring, Evaluation, Goals:  Patient will tolerate adequate calories and protein for weight maintenance.  Next Visit:  Patient will call with questions or concerns.  Follow up appointment as needed.

## 2014-07-12 NOTE — Assessment & Plan Note (Signed)
We had a long discussion at the ENT tumor board this morning. Due to the size of the tumor, I recommended induction chemotherapy followed by radiation treatment. The patient wants to be treated closer to home. I will refer him to the [facility] in [place] and would defer to them about chemotherapy decision. Alternative would be treat him with concurrent chemoradiation therapy with high-dose cisplatin.

## 2014-07-13 ENCOUNTER — Other Ambulatory Visit: Payer: Self-pay | Admitting: Radiation Therapy

## 2014-07-13 ENCOUNTER — Encounter: Payer: Self-pay | Admitting: *Deleted

## 2014-07-14 ENCOUNTER — Other Ambulatory Visit: Payer: Self-pay | Admitting: Radiation Therapy

## 2014-07-14 DIAGNOSIS — C7949 Secondary malignant neoplasm of other parts of nervous system: Principal | ICD-10-CM

## 2014-07-14 DIAGNOSIS — C7931 Secondary malignant neoplasm of brain: Secondary | ICD-10-CM

## 2014-07-15 NOTE — Progress Notes (Signed)
Met with patient, his wife and cousin Brayton Layman - provides transportation for them) during consult appts with Drs. Alvy Bimler and Isidore Moos.  Introduced myself as Designer, television/film set, explained my role as a member of the Care Team, encouraged them to call me with questions re: appts, tmts, other concerns.  They verbalized understanding.  They expressed preference for tmts in [place] d/t transportation issues and proximity to their home in [place]. Initiating navigation as L1 patient (new patient) with this encounter; navigation to be modified pending tmt site.  Gayleen Orem, RN, BSN, [facility] Head & Neck Oncology Navigator [phone]

## 2014-07-17 ENCOUNTER — Encounter (HOSPITAL_COMMUNITY): Payer: Medicare HMO | Attending: Hematology

## 2014-07-17 ENCOUNTER — Encounter (HOSPITAL_COMMUNITY): Payer: Self-pay

## 2014-07-17 VITALS — BP 130/76 | HR 96 | Temp 97.8°F | Resp 18 | Ht 72.0 in | Wt 136.3 lb

## 2014-07-17 DIAGNOSIS — C4492 Squamous cell carcinoma of skin, unspecified: Secondary | ICD-10-CM | POA: Insufficient documentation

## 2014-07-17 DIAGNOSIS — Z8546 Personal history of malignant neoplasm of prostate: Secondary | ICD-10-CM | POA: Insufficient documentation

## 2014-07-17 DIAGNOSIS — C801 Malignant (primary) neoplasm, unspecified: Secondary | ICD-10-CM | POA: Diagnosis not present

## 2014-07-17 DIAGNOSIS — I10 Essential (primary) hypertension: Secondary | ICD-10-CM | POA: Diagnosis not present

## 2014-07-17 DIAGNOSIS — C8591 Non-Hodgkin lymphoma, unspecified, lymph nodes of head, face, and neck: Secondary | ICD-10-CM

## 2014-07-17 DIAGNOSIS — C760 Malignant neoplasm of head, face and neck: Secondary | ICD-10-CM | POA: Diagnosis not present

## 2014-07-17 DIAGNOSIS — C8581 Other specified types of non-Hodgkin lymphoma, lymph nodes of head, face, and neck: Secondary | ICD-10-CM | POA: Diagnosis not present

## 2014-07-17 DIAGNOSIS — E039 Hypothyroidism, unspecified: Secondary | ICD-10-CM | POA: Insufficient documentation

## 2014-07-17 DIAGNOSIS — C770 Secondary and unspecified malignant neoplasm of lymph nodes of head, face and neck: Secondary | ICD-10-CM | POA: Insufficient documentation

## 2014-07-17 LAB — CBC WITH DIFFERENTIAL/PLATELET
Basophils Absolute: 0 10*3/uL (ref 0.0–0.1)
Basophils Relative: 0 % (ref 0–1)
EOS ABS: 0 10*3/uL (ref 0.0–0.7)
EOS PCT: 0 % (ref 0–5)
HCT: 45 % (ref 39.0–52.0)
HEMOGLOBIN: 14.4 g/dL (ref 13.0–17.0)
LYMPHS ABS: 1.7 10*3/uL (ref 0.7–4.0)
Lymphocytes Relative: 30 % (ref 12–46)
MCH: 26.1 pg (ref 26.0–34.0)
MCHC: 32 g/dL (ref 30.0–36.0)
MCV: 81.7 fL (ref 78.0–100.0)
MONO ABS: 0.4 10*3/uL (ref 0.1–1.0)
MONOS PCT: 8 % (ref 3–12)
Neutro Abs: 3.5 10*3/uL (ref 1.7–7.7)
Neutrophils Relative %: 62 % (ref 43–77)
Platelets: 214 10*3/uL (ref 150–400)
RBC: 5.51 MIL/uL (ref 4.22–5.81)
RDW: 14.9 % (ref 11.5–15.5)
WBC: 5.6 10*3/uL (ref 4.0–10.5)

## 2014-07-17 LAB — URIC ACID: Uric Acid, Serum: 5.2 mg/dL (ref 4.0–7.8)

## 2014-07-17 LAB — COMPREHENSIVE METABOLIC PANEL
ALT: 7 U/L (ref 0–53)
ANION GAP: 12 (ref 5–15)
AST: 22 U/L (ref 0–37)
Albumin: 3.8 g/dL (ref 3.5–5.2)
Alkaline Phosphatase: 97 U/L (ref 39–117)
BUN: 16 mg/dL (ref 6–23)
CALCIUM: 9.9 mg/dL (ref 8.4–10.5)
CO2: 29 mEq/L (ref 19–32)
Chloride: 98 mEq/L (ref 96–112)
Creatinine, Ser: 0.89 mg/dL (ref 0.50–1.35)
GFR calc non Af Amer: >90 mL/min (ref >90)
GLUCOSE: 122 mg/dL — AB (ref 70–99)
Potassium: 3.7 mEq/L (ref 3.7–5.3)
Sodium: 139 mEq/L (ref 137–147)
Total Bilirubin: 0.3 mg/dL (ref 0.3–1.2)
Total Protein: 8.1 g/dL (ref 6.0–8.3)

## 2014-07-17 LAB — MAGNESIUM: MAGNESIUM: 2.4 mg/dL (ref 1.5–2.5)

## 2014-07-17 NOTE — Progress Notes (Signed)
[name]  PCP [name], [place] [address]  DIAGNOSIS: Malignant lymphomas of lymph nodes of head, face, and neck - Plan: CBC with Differential, Comprehensive metabolic panel, Uric acid, Magnesium, Creatinine clearance, urine, 24 hour, CANCELED: Basic metabolic panel, CANCELED: Creatinine clearance, urine, 24 hour  Secondary and unspecified malignant neoplasm of lymph nodes of head, face, and neck  CHIEF COMPLAINTS/PURPOSE OF VISIT  Squamous cell carcinoma of the neck. Medical Oncology followup for treatment planning.     HISTORY OF PRESENTING ILLNESS:  Brandon Hall 58 y.o. male is here because of newly diagnosed locally advanced squamous cell carcinoma. According to the patient, the first initial presentation was due to palpable lump on the left side of his neck extending around April of this year. Since then, it has gotten larger. After undergoing a CT scan of the neck he had a biopsy. Denies any hearing deficit, swallowing difficulties, painful swallowing, changes in the quality of his voice;  He tolerates soft foods. weight loss of less than 10 pounds. Mass is now associated with pain when he tries to lie down. No compromise of his breathing. Denies chest pain.   I reviewed his records extensively Last week [name] was seen by Radiation and Medical Oncology at the [facility] and his case was discussed at tumor conference. The patient underwent multiple biopsies of the nasopharynx which were all negative. The PET scan did not yield a primary site. Mr. Perea was felt to have Stage IV disease due to his lymph node mass of at least 6cm. Tentatvie plan for sequential chemotherapy followed by radiation was discussed.   The patient also has a history of localized prostate cancer diagnosed many years ago.This year, he has rising PSA and was treated with cryoablation. He have nocturnal 3 times at night. He denies any  urinary difficulties or painful urination.  MEDICAL HISTORY:  Past Medical History  Diagnosis Date  . Graves disease   . Hypothyroidism, postradioiodine therapy   . History of shingles     RASH  02-03-2014  PER PCP RESOLVING  . Prostate cancer     DX  SEVERAL  YRS   AGO  WITH RADIATION THERAPY  . Cataract, secondary obscuring vision     LEFT  . Blindness of right eye   . Hypertension     NO MEDS ,  MONITORED BY PCP  . Arthritis   . 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 Cancer     pharyngeal ca    has Hypertension; Graves disease; Blind right eye; Dyslipidemia; Abnormal transaminases; Hypothyroid; Shingles rash; Neck mass; Secondary and unspecified malignant neoplasm of lymph nodes of head, face, and neck; and History of prostate cancer on his problem list.    ALLERGIES:  has No Known Allergies.  MEDICATIONS: has a current medication list which includes the following prescription(s): hydrochlorothiazide and levothyroxine.  FAMILY HISTORY: family history includes Colon cancer in his father; Hypertension in his mother.  REVIEW OF SYSTEMS:    SINCE YOUR LAST VISIT Been diagnosed or treated for a new medical /surgical  problem or condition: No Any Recent Xrays or studies performed: Yes (PET scan and biopsy in july) Any new prescription or OTC medications: No ECOG Perf Status: Ambulatory and capable of all selfcare but unable to carry out any work activities.  Up and about more than 50% of waking hours Problems sleeping: Yes (neck hurts when sleeping) Medications taken  to help sleep: No How is your appetie: 100% normal Any Supplements: Yes (boost and ensure) Any trouble chewing or swallowing: No Any Nausea or Vomiting: No Any Bowel problems: No # Bowel Movements per week: 7 Any Urinary Issues: No Any Cardiac Problems: No Any Respiratory Issues: No Any Neurological Issues: No Do you live alone: No Feelings hopelessness: No You or your family have any concerns  or Health changes: No Pain Assessment Pain Score: 0-No pain  Other than that discussed above is noncontributory.    PHYSICAL EXAMINATION:   height is 6' (1.829 m) and weight is 136 lb 4.8 oz (61.825 kg). His oral temperature is 97.8 F (36.6 C). His blood pressure is 130/76 and his pulse is 96. His respiration is 18 and oxygen saturation is 98%.    GENERAL:alert, no distress and comfortable. He looks older than his stated age. Obvious left neck mass. SKIN: skin color, texture, turgor are normal, no rashes or significant lesions EYES: normal, conjunctiva are pink and non-injected, sclera clear OROPHARYNX:no exudate, no erythema and lips, buccal mucosa, and tongue normal . He has no teeth NECK: supple, thyroid normal size, non-tender, without nodularity LYMPH:  Significant lymphadenopathy on the left of his neck measure fist sized. LUNGS: clear to auscultation and percussion with distant breath sounds. HEART: regular rate & rhythm and no murmurs and no lower extremity edema ABDOMEN:abdomen soft, non-tender and normal bowel sounds Musculoskeletal:no cyanosis of digits and no clubbing.Decreased muscle mass.   PSYCH: alert & oriented x 3 with fluent speech NEURO: no focal motor/sensory deficits. Reflexes diffusely decreased.   LABORATORY DATA: Office Visit on 07/04/2014  Component Date Value Ref Range Status  . TSH 07/04/2014 13.220* 0.450 - 4.500 uIU/mL Final  . T4, Total 07/04/2014 10.7  4.5 - 12.0 ug/dL Final  . T3 Uptake Ratio 07/04/2014 22* 24 - 39 % Final  . Free Thyroxine Index 07/04/2014 2.4  1.2 - 4.9 Final  Hospital Outpatient Visit on 06/23/2014  Component Date Value Ref Range Status  . Sodium 06/23/2014 139  137 - 147 mEq/L Final  . Potassium 06/23/2014 4.5  3.7 - 5.3 mEq/L Final  . Chloride 06/23/2014 97  96 - 112 mEq/L Final  . CO2 06/23/2014 27  19 - 32 mEq/L Final  . Glucose, Bld 06/23/2014 100* 70 - 99 mg/dL Final  . BUN 06/23/2014 12  6 - 23 mg/dL Final  .  Creatinine, Ser 06/23/2014 0.94  0.50 - 1.35 mg/dL Final  . Calcium 06/23/2014 9.8  8.4 - 10.5 mg/dL Final  . GFR calc non Af Amer 06/23/2014 90* >90 mL/min Final  . GFR calc Af Amer 06/23/2014 >90  >90 mL/min Final   Comment: (NOTE)                          The eGFR has been calculated using the CKD EPI equation.                          This calculation has not been validated in all clinical situations.                          eGFR's persistently <90 mL/min signify possible Chronic Kidney                          Disease.  . WBC 06/23/2014 5.4  4.0 - 10.5  K/uL Final  . RBC 06/23/2014 5.76  4.22 - 5.81 MIL/uL Final  . Hemoglobin 06/23/2014 15.5  13.0 - 17.0 g/dL Final  . HCT 06/23/2014 48.7  39.0 - 52.0 % Final  . MCV 06/23/2014 84.5  78.0 - 100.0 fL Final  . MCH 06/23/2014 26.9  26.0 - 34.0 pg Final  . MCHC 06/23/2014 31.8  30.0 - 36.0 g/dL Final  . RDW 06/23/2014 15.0  11.5 - 15.5 % Final  . Platelets 06/23/2014 176  150 - 400 K/uL Final  Hospital Outpatient Visit on 06/20/2014  Component Date Value Ref Range Status  . Glucose-Capillary 06/20/2014 101* 70 - 99 mg/dL Final     BUN 12, Creatinine 0.9, and Calcium 9.8   PATHOLOGY:Diagnosis Lymph node, needle/core biopsy, L Neck - SQUAMOUS CELL CARCINOMA. Microscopic Comment There is a small amount of lymphoid tissue present and therefore this likely represents metastatic squamous cell carcinoma. Called to Dr Wilburn Cornelia on 05/26/2014. (JDP:ecj 05/26/2014) Claudette Laws MD Pathologist, Electronic Signature (Case signed 05/26/2014)    RADIOGRAPHIC STUDIES: Dg Chest 2 View  06/23/2014   CLINICAL DATA:  Hypertension.  Preop.  Left neck mass.  EXAM: CHEST  2 VIEW  COMPARISON:  PET-CT 06/20/2014.  Chest radiographs 10/02/2011.  FINDINGS: Cardiomediastinal silhouette is within normal limits. The lungs remain hyperinflated. There is no evidence of airspace consolidation, pleural effusion, or pneumothorax. Bulky right-sided osteophyte is  again seen at T8-9.  IMPRESSION: No active cardiopulmonary disease.   Electronically Signed   By: Logan Bores   On: 06/23/2014 15:37   Nm Pet Image Initial (pi) Skull Base To Thigh  06/20/2014   CLINICAL DATA:  Initial treatment strategy for head and neck cancer.  EXAM: NUCLEAR MEDICINE PET SKULL BASE TO THIGH  TECHNIQUE: 8.1 mCi F-18 FDG was injected intravenously. Full-ring PET imaging was performed from the skull base to thigh after the radiotracer. CT data was obtained and used for attenuation correction and anatomic localization.  FASTING BLOOD GLUCOSE:  Value: 101 mg/dl  COMPARISON:  None.  FINDINGS: NECK  Immediately posterior to the sternocleidomastoid muscle there is a 5.6 x 5.1 cm mass that has central low attenuation and peripheral hypermetabolism (SUVmax = 8.5)with central hypometabolism, favored to represent a malignant centrally necrotic level 5A lymph node. In the posterior aspect of the nasopharynx on the left side there is some asymmetric amorphous soft tissue thickening best appreciated on image 14 of series 4, which corresponds to some asymmetric hypermetabolism (SUVmax = 4.2) suspicious for potential primary neoplasm. There is also some hypermetabolism in the the posterior aspect of the suboccipital regions bilaterally (SUVmax = 3.3 on the right and 5.7 on the left), which does not appear to correspond to a soft tissue mass or nodal tissue, but rather is favored to be physiologic activity within the rectus capitis muscles. There is also relatively symmetric hypermetabolism throughout the parotid glands bilaterally (SUVmax = 6.0 on the right and 5.8 on the left), favored to be physiologic.  CHEST  No hypermetabolic mediastinal or hilar nodes. 5 mm right middle lobe nodule (image 56 of series 7) demonstrates no definite hypermetabolism (nodules of this size are commonly not hypermetabolic even if malignant). Mild paraseptal and centrilobular emphysema. Heart size is normal. There is no  significant pericardial fluid, thickening or pericardial calcification. There is atherosclerosis of the thoracic aorta, the great vessels of the mediastinum and the coronary arteries, including calcified atherosclerotic plaque in the left main, left anterior descending and left circumflex coronary arteries.  ABDOMEN/PELVIS  No abnormal hypermetabolic activity within the liver, pancreas, adrenal glands, or spleen. No hypermetabolic lymph nodes in the abdomen. There are 2 small exophytic low-attenuation lesions in the upper pole of the right kidney, largest of which measures 2.8 cm, which demonstrate no internal hypermetabolism, which are similar to prior examinations, favored to represent small cysts. A larger 2.6 cm low-attenuation non hypermetabolic lesion in the lower pole of the right kidney also likely represents a cyst. Atherosclerosis throughout the it abdominal and pelvic vasculature, without definite aneurysm. No significant volume of ascites. No pneumoperitoneum. No pathologic distention of small bowel. Left inguinal lymph nodes are noted demonstrating low-level hypermetabolism. Specific examples include a 7 mm short axis left inguinal lymph node (image 175 of series 4), and a 8 mm short axis left inguinal lymph node (image 187 of series 4), which are both hypermetabolic (SUVmax = 3.2 and 4.2 respectively).  SKELETON  No focal hypermetabolic activity to suggest skeletal metastasis.  IMPRESSION: 1. 5.6 x 5.1 cm left cervical soft tissue mass demonstrates peripheral hypermetabolism and central low-attenuation/hypometabolism, compatible with a malignant centrally necrotic level 5A lymph node in this patient with history of biopsy-proven squamous cell carcinoma. 2. There is slight asymmetry of soft tissue and metabolic activity in the posterior aspect of the nasopharynx on the left side where there is a small focus of soft tissue thickening and hypermetabolism (SUVmax = 4.2). Clinical correlation for evidence  of primary nasopharyngeal carcinoma in this region is suggested. 3. Symmetric hypermetabolism in the parotid glands bilaterally without definite focal soft tissue mass. This favored to be physiologic. 4. Nonenlarged left inguinal lymph nodes demonstrate low level hypermetabolic activity, as discussed above. This would be an unusual site of metastatic disease for primary head neck cancer, and is favored to be reactive. Clinical correlation is recommended, and attention on followup studies is suggested to ensure stability. 5. 5 mm right middle lobe nodule is nonspecific, but is unchanged compared to prior study 12/27/2013 and therefore favored to be benign. However, continued attention on followup studies is recommended. 6. Mild centrilobular and paraseptal emphysema. 7. Atherosclerosis, including left main and 2 vessel coronary artery disease. Please note that although the presence of coronary artery calcium documents the presence of coronary artery disease, the severity of this disease and any potential stenosis cannot be assessed on this non-gated CT examination. Assessment for potential risk factor modification, dietary therapy or pharmacologic therapy may be warranted, if clinically indicated. 8. Additional incidental findings, as above.   Electronically Signed   By: Vinnie Langton M.D.   On: 06/20/2014 09:33    ASSESSMENT/PLAN  Newly diagnosed squamous cell carcinoma of the Head & Neck, HPV N/A. Clinically there is no apparent primary site and the patient is in need of local control of his mass. If it were to respond to chemotherapy, it may be possible to improve the overall results once radiation therapy has been  added. Mr. Rappaport is scheduled for a Brain MRI to evaluate the right cerebellar area. He will also have a creatinine clearance to determine if the patient is a candidate for cisplatin. Alternatively, infusional 5FU or Erbitux could be used along with Radiation Therapy. After completion of the  above tests, I will discuss a treatment plan with Dr. Isidore Moos with Radiation Oncology. The patient and his family were apprised of the risks and side effects of chemotherapy, including the possiblity of a life threatening illness. Thank you for referring this very nice gentleman.        All  questions were answered. The patient knows to call the clinic with any problems, questions or concerns. We can certainly see the patient much sooner if necessary.    Darrall Dears, MD 07/17/2014 5:16 PM

## 2014-07-17 NOTE — Patient Instructions (Signed)
[facility] Discharge Instructions  RECOMMENDATIONS MADE BY THE CONSULTANT AND ANY TEST RESULTS WILL BE SENT TO YOUR REFERRING PHYSICIAN.  EXAM FINDINGS BY THE PHYSICIAN TODAY AND SIGNS OR SYMPTOMS TO REPORT TO CLINIC OR PRIMARY PHYSICIAN: You saw Dr Bubba Hales today  Please let us know when interventional radiology has contacted you so we can make a follow up appt.  If they have contacted before Thursday contact [facility].  If you get teh voicemail just leave a message  Thank you for choosing [facility] to provide your oncology and hematology care.  To afford each patient quality time with our providers, please arrive at least 15 minutes before your scheduled appointment time.  With your help, our goal is to use those 15 minutes to complete the necessary work-up to ensure our physicians have the information they need to help with your evaluation and healthcare recommendations.    Effective January 1st, 2014, we ask that you re-schedule your appointment with our physicians should you arrive 10 or more minutes late for your appointment.  We strive to give you quality time with our providers, and arriving late affects you and other patients whose appointments are after yours.    Again, thank you for choosing [facility].  Our hope is that these requests will decrease the amount of time that you wait before being seen by our physicians.       _____________________________________________________________  Should you have questions after your visit to [facility], please contact our office at [phone] between the hours of 8:30 a.m. and 5:00 p.m.  Voicemails left after 4:30 p.m. will not be returned until the following business day.  For prescription refill requests, have your pharmacy contact our office with your prescription refill request.    24-Hour Urine Collection HOME CARE  When you get up in the morning on the day you do this  test, pee (urinate) in the toilet and flush. Make a note of the time. This will be your start time on the day of collection and the end time on the next morning.  From then on, save all your pee (urine) in the plastic jug that was given to you.  You should stop collecting your pee 24 hours after you started.  If the plastic jug that is given to you already has liquid in it, that is okay. Do not throw out the liquid or rinse out the jug. Some tests need the liquid to be added to your pee.  Keep your plastic jug cool (in an ice chest or the refrigerator) during the test.  When the 24 hours is over, bring your plastic jug to the clinic lab. Keep the jug cool (in an ice chest) while you are bringing it to the lab. Document Released: 03/13/2009 Document Revised: 03/08/2012 Document Reviewed: 03/13/2009 [facility] Patient Information 2015 [place], [place]. This information is not intended to replace advice given to you by your health care provider. Make sure you discuss any questions you have with your health care provider.

## 2014-07-17 NOTE — Progress Notes (Signed)
Brandon Hall's reason for visit today is for labs as scheduled per MD orders.  Venipuncture performed with a 23 gauge butterfly needle to L Antecubital.  Brandon Hall tolerated procedure well and without incident; questions were answered and patient was discharged.

## 2014-07-18 ENCOUNTER — Telehealth (HOSPITAL_COMMUNITY): Payer: Self-pay | Admitting: Emergency Medicine

## 2014-07-18 NOTE — Telephone Encounter (Signed)
Message copied by Elenor Legato on Tue Jul 18, 2014  4:37 PM ------      Message from: [facility]      Created: Tue Jul 18, 2014  7:22 AM       WNL.  Urine CrCl is pending ------

## 2014-07-19 ENCOUNTER — Other Ambulatory Visit: Payer: Medicare HMO

## 2014-07-19 DIAGNOSIS — C4492 Squamous cell carcinoma of skin, unspecified: Secondary | ICD-10-CM | POA: Diagnosis not present

## 2014-07-19 LAB — CREATININE CLEARANCE, URINE, 24 HOUR
CREATININE 24H UR: 1650 mg/d (ref 800–2000)
Collection Interval-CRCL: 24 hours
Creatinine Clearance: 84 mL/min (ref 75–125)
Creatinine, Urine: 65.34 mg/dL
Creatinine: 0.89 mg/dL (ref 0.50–1.35)
Urine Total Volume-CRCL: 1650 mL

## 2014-07-19 NOTE — Addendum Note (Signed)
Addended by: Berneta Levins on: 07/19/2014 03:28 PM   Modules accepted: Orders

## 2014-07-21 ENCOUNTER — Other Ambulatory Visit (HOSPITAL_COMMUNITY): Payer: Self-pay | Admitting: Oncology

## 2014-07-21 ENCOUNTER — Encounter: Payer: Self-pay | Admitting: Radiation Oncology

## 2014-07-21 ENCOUNTER — Ambulatory Visit
Admission: RE | Admit: 2014-07-21 | Discharge: 2014-07-21 | Disposition: A | Discharge status: Home / Self Care | Payer: Medicare HMO | Source: Ambulatory Visit | Attending: Radiation Oncology | Admitting: Radiation Oncology

## 2014-07-21 DIAGNOSIS — C7949 Secondary malignant neoplasm of other parts of nervous system: Principal | ICD-10-CM

## 2014-07-21 DIAGNOSIS — C7931 Secondary malignant neoplasm of brain: Secondary | ICD-10-CM

## 2014-07-21 DIAGNOSIS — C770 Secondary and unspecified malignant neoplasm of lymph nodes of head, face and neck: Secondary | ICD-10-CM

## 2014-07-21 MED ORDER — GADOBENATE DIMEGLUMINE 529 MG/ML IV SOLN
12.0000 mL | Freq: Once | INTRAVENOUS | Status: AC | PRN
  Administered 2014-07-21: 12 mL via INTRAVENOUS

## 2014-07-21 MED ORDER — PROCHLORPERAZINE MALEATE 10 MG PO TABS: ORAL_TABLET | ORAL | Status: DC

## 2014-07-21 MED ORDER — METOCLOPRAMIDE HCL 5 MG PO TABS: ORAL_TABLET | ORAL | Status: DC

## 2014-07-21 NOTE — Progress Notes (Signed)
Spoke with patient and wife over phone. Informed them of MRI results (negative for metastases.) Reminded them of appt on Monday at [facility].  -----------------------------------  Eppie Gibson, MD

## 2014-07-21 NOTE — Patient Instructions (Signed)
[facility]   CHEMOTHERAPY INSTRUCTIONS  Prior to receiving chemo you will be given 2 high powered medications to prevent/reduce nausea/vomiting. The names of these medications are Aloxi and Emend. We will also give you a steroid through your IV line called Dexamethasone. Together these medications will reduce your risk of having nausea/vomiting as well as decrease the severity of the nausea/vomiting. The steroid, Dexamethasone, may cause you to feel jittery, nervous, make you have trouble sleeping, or make you feel flushed in the face and neck ---these side effects should pass as the medication wears off.   Cisplatin - this medication is hard on your kidneys. This is why we give you a large amount of fluids through your IV while you are here getting chemo. We also need you drinking 64 oz of fluid (preferably water/decaff fluids) 2 days prior to chemo and for up to 4-5 days after chemo. Drink more if you can. This will help keep your kidneys flushed. This can also cause acute and/or delayed nausea/vomiting. You must take your nausea meds as prescribed if you get nauseated. Do not wait until you start vomiting. This med can also cause peripheral neuropathy (numbness/tingling/burning in hands/fingers/feet/toes). Let us know if this develops so that we can monitor it and treat if necessary. We will hydrate you with IV fluids prior to you receiving Cisplatin and after you receive Cisplatin.   5FU: bone marrow suppression (low white blood cells - wbcs fight infection, low red blood cells - rbcs make up your blood, low platelets - this is what makes your blood clot, nausea/vomiting, diarrhea, mouth sores, hair loss, dry skin, ocular toxicities (increased tear production, sensitivity to light). You must wear sunscreen/sunglasses. Cover your skin when out in sunlight. You will get burned very easily. (you will wear this medication on a pump for 5 days).   You will be at the  [facility] all day because of having to take IV fluid and the chemo infusion. Plan on being here 7 hours.   POTENTIAL SIDE EFFECTS OF TREATMENT: Increased Susceptibility to Infection, Vomiting, Constipation, Hair Thinning, Changes in Character of Skin and Nails (brittleness, dryness,etc.), Bone Marrow Suppression, Complete Hair Loss, Nausea, Diarrhea, Sun Sensitivity and Mouth Sores   SELF IMAGE NEEDS AND REFERRALS MADE: Obtain hair accessories as soon as possible (caps,etc.)   EDUCATIONAL MATERIALS GIVEN AND REVIEWED: Chemotherapy and You  Specific Instructions Sheets: Cisplatin, 5FU, Aloxi, Emend, Dexamethasone, Reglan, Compazine, EMLA cream, PORT-A-CATH   SELF CARE ACTIVITIES WHILE ON CHEMOTHERAPY: Increase your fluid intake 48 hours prior to treatment and drink at least 2 quarts per day after treatment., No alcohol intake., No aspirin or other medications unless approved by your oncologist., Eat foods that are light and easy to digest., Eat foods at cold or room temperature., No fried, fatty, or spicy foods immediately before or after treatment., Have teeth cleaned professionally before starting treatment. Keep dentures and partial plates clean., Use soft toothbrush and do not use mouthwashes that contain alcohol. Biotene is a good mouthwash that is available at most pharmacies or may be ordered by calling [phone]., Use warm salt water gargles (1 teaspoon salt per 1 quart warm water) before and after meals and at bedtime. Or you may rinse with 2 tablespoons of three -percent hydrogen peroxide mixed in eight ounces of water., Always use sunscreen with SPF (Sun Protection Factor) of 30 or higher., Use your nausea medication as directed to prevent nausea., Use your stool softener or laxative  as directed to prevent constipation. and Use your anti-diarrheal medication as directed to stop diarrhea.   MEDICATIONS: You have been given prescriptions for the following  medications:  Metoclopramide $RemoveBeforeD'5mg'gSGViardoyQROr$  tablet. Starting the day after chemo, take 1 tablet four times a day x 48 hours. Then may take 1 tablet four times a day IF needed for nausea/vomiting.   Prochlorperazine $RemoveBeforeDEI'10mg'[id number]$  tablet. Starting the day after chemo, take 1 tablet four times a day x 48 hours. Then may take 1 tablet four times a day IF needed for nausea/vomiting.   EMLA cream. Apply a quarter size amount to port site 1 hour prior to chemo. Do not rub in. Cover with plastic wrap.  Over-the-Counter Meds:  Colace - this is a stool softener. Take $RemoveBefore'100mg'EaClasfJSUaKW$  capsule 2-6 times a day as needed. If you have to take more than 6 capsules of Colace a day call the [facility].  Senna - this is a mild laxative used to treat mild constipation. May take 2 tabs by mouth daily or up to twice a day as needed for mild constipation.  Milk of Magnesia - this is a laxative used to treat moderate to severe constipation. May take 2-4 tablespoons every 8 hours as needed. May increase to 8 tablespoons x 1 dose and if no bowel movement call the [facility].  Imodium - this is for diarrhea. Take 2 tabs after 1st loose stool and then 1 tab after each loose stool until you go a total of 12 hours without a loose stool. Call [facility] if loose stools continue.   SYMPTOMS TO REPORT AS SOON AS POSSIBLE AFTER TREATMENT:  FEVER GREATER THAN 100.5 F  CHILLS WITH OR WITHOUT FEVER  NAUSEA AND VOMITING THAT IS NOT CONTROLLED WITH YOUR NAUSEA MEDICATION  UNUSUAL SHORTNESS OF BREATH  UNUSUAL BRUISING OR BLEEDING  TENDERNESS IN MOUTH AND THROAT WITH OR WITHOUT PRESENCE OF ULCERS  URINARY PROBLEMS  BOWEL PROBLEMS  UNUSUAL RASH    Wear comfortable clothing and clothing appropriate for easy access to any Portacath or PICC line. Let us know if there is anything that we can do to make your therapy better!      I have been informed and understand all of the instructions given to me and have received a copy. I have been  instructed to call the clinic [phone] or my family physician as soon as possible for continued medical care, if indicated. I do not have any more questions at this time but understand that I may call the [facility] or the Patient Navigator at [phone] during office hours should I have questions or need assistance in obtaining follow-up care.      _________________________________________      _______________     __________ Signature of Patient or Authorized Representative        Date                            Time      _________________________________________ Nurse's Signature      Cisplatin injection What is this medicine? CISPLATIN (SIS pla tin) is a chemotherapy drug. It targets fast dividing cells, like cancer cells, and causes these cells to die. This medicine is used to treat many types of cancer like bladder, ovarian, and testicular cancers. This medicine may be used for other purposes; ask your health care provider or pharmacist if you have questions. COMMON BRAND NAME(S): Platinol, Platinol -AQ What should  I tell my health care provider before I take this medicine? They need to know if you have any of these conditions: -blood disorders -hearing problems -kidney disease -recent or ongoing radiation therapy -an unusual or allergic reaction to cisplatin, carboplatin, other chemotherapy, other medicines, foods, dyes, or preservatives -pregnant or trying to get pregnant -breast-feeding How should I use this medicine? This drug is given as an infusion into a vein. It is administered in a hospital or clinic by a specially trained health care professional. Talk to your pediatrician regarding the use of this medicine in children. Special care may be needed. Overdosage: If you think you have taken too much of this medicine contact a poison control center or emergency room at once. NOTE: This medicine is only for you. Do not share this medicine with others. What  if I miss a dose? It is important not to miss a dose. Call your doctor or health care professional if you are unable to keep an appointment. What may interact with this medicine? -dofetilide -foscarnet -medicines for seizures -medicines to increase blood counts like filgrastim, pegfilgrastim, sargramostim -probenecid -pyridoxine used with altretamine -rituximab -some antibiotics like amikacin, gentamicin, neomycin, polymyxin B, streptomycin, tobramycin -sulfinpyrazone -vaccines -zalcitabine Talk to your doctor or health care professional before taking any of these medicines: -acetaminophen -aspirin -ibuprofen -ketoprofen -naproxen This list may not describe all possible interactions. Give your health care provider a list of all the medicines, herbs, non-prescription drugs, or dietary supplements you use. Also tell them if you smoke, drink alcohol, or use illegal drugs. Some items may interact with your medicine. What should I watch for while using this medicine? Your condition will be monitored carefully while you are receiving this medicine. You will need important blood work done while you are taking this medicine. This drug may make you feel generally unwell. This is not uncommon, as chemotherapy can affect healthy cells as well as cancer cells. Report any side effects. Continue your course of treatment even though you feel ill unless your doctor tells you to stop. In some cases, you may be given additional medicines to help with side effects. Follow all directions for their use. Call your doctor or health care professional for advice if you get a fever, chills or sore throat, or other symptoms of a cold or flu. Do not treat yourself. This drug decreases your body's ability to fight infections. Try to avoid being around people who are sick. This medicine may increase your risk to bruise or bleed. Call your doctor or health care professional if you notice any unusual bleeding. Be careful  brushing and flossing your teeth or using a toothpick because you may get an infection or bleed more easily. If you have any dental work done, tell your dentist you are receiving this medicine. Avoid taking products that contain aspirin, acetaminophen, ibuprofen, naproxen, or ketoprofen unless instructed by your doctor. These medicines may hide a fever. Do not become pregnant while taking this medicine. Women should inform their doctor if they wish to become pregnant or think they might be pregnant. There is a potential for serious side effects to an unborn child. Talk to your health care professional or pharmacist for more information. Do not breast-feed an infant while taking this medicine. Drink fluids as directed while you are taking this medicine. This will help protect your kidneys. Call your doctor or health care professional if you get diarrhea. Do not treat yourself. What side effects may I notice from receiving this medicine?  Side effects that you should report to your doctor or health care professional as soon as possible: -allergic reactions like skin rash, itching or hives, swelling of the face, lips, or tongue -signs of infection - fever or chills, cough, sore throat, pain or difficulty passing urine -signs of decreased platelets or bleeding - bruising, pinpoint red spots on the skin, black, tarry stools, nosebleeds -signs of decreased red blood cells - unusually weak or tired, fainting spells, lightheadedness -breathing problems -changes in hearing -gout pain -low blood counts - This drug may decrease the number of white blood cells, red blood cells and platelets. You may be at increased risk for infections and bleeding. -nausea and vomiting -pain, swelling, redness or irritation at the injection site -pain, tingling, numbness in the hands or feet -problems with balance, movement -trouble passing urine or change in the amount of urine Side effects that usually do not require  medical attention (report to your doctor or health care professional if they continue or are bothersome): -changes in vision -loss of appetite -metallic taste in the mouth or changes in taste This list may not describe all possible side effects. Call your doctor for medical advice about side effects. You may report side effects to FDA at 1-800-FDA-1088. Where should I keep my medicine? This drug is given in a hospital or clinic and will not be stored at home. NOTE: This sheet is a summary. It may not cover all possible information. If you have questions about this medicine, talk to your doctor, pharmacist, or health care provider.  2015, Elsevier/Gold Standard. (2008-03-21 14:40:54) Fluorouracil, 5-FU injection What is this medicine? FLUOROURACIL, 5-FU (flure oh YOOR a sil) is a chemotherapy drug. It slows the growth of cancer cells. This medicine is used to treat many types of cancer like breast cancer, colon or rectal cancer, pancreatic cancer, and stomach cancer. This medicine may be used for other purposes; ask your health care provider or pharmacist if you have questions. COMMON BRAND NAME(S): Adrucil What should I tell my health care provider before I take this medicine? They need to know if you have any of these conditions: -blood disorders -dihydropyrimidine dehydrogenase (DPD) deficiency -infection (especially a virus infection such as chickenpox, cold sores, or herpes) -kidney disease -liver disease -malnourished, poor nutrition -recent or ongoing radiation therapy -an unusual or allergic reaction to fluorouracil, other chemotherapy, other medicines, foods, dyes, or preservatives -pregnant or trying to get pregnant -breast-feeding How should I use this medicine? This drug is given as an infusion or injection into a vein. It is administered in a hospital or clinic by a specially trained health care professional. Talk to your pediatrician regarding the use of this medicine in  children. Special care may be needed. Overdosage: If you think you have taken too much of this medicine contact a poison control center or emergency room at once. NOTE: This medicine is only for you. Do not share this medicine with others. What if I miss a dose? It is important not to miss your dose. Call your doctor or health care professional if you are unable to keep an appointment. What may interact with this medicine? -allopurinol -cimetidine -dapsone -digoxin -hydroxyurea -leucovorin -levamisole -medicines for seizures like ethotoin, fosphenytoin, phenytoin -medicines to increase blood counts like filgrastim, pegfilgrastim, sargramostim -medicines that treat or prevent blood clots like warfarin, enoxaparin, and dalteparin -methotrexate -metronidazole -pyrimethamine -some other chemotherapy drugs like busulfan, cisplatin, estramustine, vinblastine -trimethoprim -trimetrexate -vaccines Talk to your doctor or health care professional before taking  any of these medicines: -acetaminophen -aspirin -ibuprofen -ketoprofen -naproxen This list may not describe all possible interactions. Give your health care provider a list of all the medicines, herbs, non-prescription drugs, or dietary supplements you use. Also tell them if you smoke, drink alcohol, or use illegal drugs. Some items may interact with your medicine. What should I watch for while using this medicine? Visit your doctor for checks on your progress. This drug may make you feel generally unwell. This is not uncommon, as chemotherapy can affect healthy cells as well as cancer cells. Report any side effects. Continue your course of treatment even though you feel ill unless your doctor tells you to stop. In some cases, you may be given additional medicines to help with side effects. Follow all directions for their use. Call your doctor or health care professional for advice if you get a fever, chills or sore throat, or other  symptoms of a cold or flu. Do not treat yourself. This drug decreases your body's ability to fight infections. Try to avoid being around people who are sick. This medicine may increase your risk to bruise or bleed. Call your doctor or health care professional if you notice any unusual bleeding. Be careful brushing and flossing your teeth or using a toothpick because you may get an infection or bleed more easily. If you have any dental work done, tell your dentist you are receiving this medicine. Avoid taking products that contain aspirin, acetaminophen, ibuprofen, naproxen, or ketoprofen unless instructed by your doctor. These medicines may hide a fever. Do not become pregnant while taking this medicine. Women should inform their doctor if they wish to become pregnant or think they might be pregnant. There is a potential for serious side effects to an unborn child. Talk to your health care professional or pharmacist for more information. Do not breast-feed an infant while taking this medicine. Men should inform their doctor if they wish to father a child. This medicine may lower sperm counts. Do not treat diarrhea with over the counter products. Contact your doctor if you have diarrhea that lasts more than 2 days or if it is severe and watery. This medicine can make you more sensitive to the sun. Keep out of the sun. If you cannot avoid being in the sun, wear protective clothing and use sunscreen. Do not use sun lamps or tanning beds/booths. What side effects may I notice from receiving this medicine? Side effects that you should report to your doctor or health care professional as soon as possible: -allergic reactions like skin rash, itching or hives, swelling of the face, lips, or tongue -low blood counts - this medicine may decrease the number of white blood cells, red blood cells and platelets. You may be at increased risk for infections and bleeding. -signs of infection - fever or chills, cough,  sore throat, pain or difficulty passing urine -signs of decreased platelets or bleeding - bruising, pinpoint red spots on the skin, black, tarry stools, blood in the urine -signs of decreased red blood cells - unusually weak or tired, fainting spells, lightheadedness -breathing problems -changes in vision -chest pain -mouth sores -nausea and vomiting -pain, swelling, redness at site where injected -pain, tingling, numbness in the hands or feet -redness, swelling, or sores on hands or feet -stomach pain -unusual bleeding Side effects that usually do not require medical attention (report to your doctor or health care professional if they continue or are bothersome): -changes in finger or toe nails -diarrhea -dry or  itchy skin -hair loss -headache -loss of appetite -sensitivity of eyes to the light -stomach upset -unusually teary eyes This list may not describe all possible side effects. Call your doctor for medical advice about side effects. You may report side effects to FDA at 1-800-FDA-1088. Where should I keep my medicine? This drug is given in a hospital or clinic and will not be stored at home. NOTE: This sheet is a summary. It may not cover all possible information. If you have questions about this medicine, talk to your doctor, pharmacist, or health care provider.  2015, Elsevier/Gold Standard. (2008-04-19 13:53:16) Palonosetron Injection What is this medicine? PALONOSETRON (pal oh NOE se tron) is used to prevent nausea and vomiting caused by chemotherapy. It also helps prevent delayed nausea and vomiting that may occur a few days after your treatment. This medicine may be used for other purposes; ask your health care provider or pharmacist if you have questions. COMMON BRAND NAME(S): Aloxi What should I tell my health care provider before I take this medicine? They need to know if you have any of these conditions: -an unusual or allergic reaction to palonosetron,  dolasetron, granisetron, ondansetron, other medicines, foods, dyes, or preservatives -pregnant or trying to get pregnant -breast-feeding How should I use this medicine? This medicine is for infusion into a vein. It is given by a health care professional in a hospital or clinic setting. Talk to your pediatrician regarding the use of this medicine in children. While this drug may be prescribed for children as young as 1 month for selected conditions, precautions do apply. Overdosage: If you think you have taken too much of this medicine contact a poison control center or emergency room at once. NOTE: This medicine is only for you. Do not share this medicine with others. What if I miss a dose? This does not apply. What may interact with this medicine? -certain medicines for depression, anxiety, or psychotic disturbances -fentanyl -linezolid -MAOIs like Carbex, Eldepryl, Marplan, Nardil, and Parnate -methylene blue (injected into a vein) -tramadol This list may not describe all possible interactions. Give your health care provider a list of all the medicines, herbs, non-prescription drugs, or dietary supplements you use. Also tell them if you smoke, drink alcohol, or use illegal drugs. Some items may interact with your medicine. What should I watch for while using this medicine? Your condition will be monitored carefully while you are receiving this medicine. What side effects may I notice from receiving this medicine? Side effects that you should report to your doctor or health care professional as soon as possible: -allergic reactions like skin rash, itching or hives, swelling of the face, lips, or tongue -breathing problems -confusion -dizziness -fast, irregular heartbeat -fever and chills -loss of balance or coordination -seizures -sweating -swelling of the hands and feet -tremors -unusually weak or tired Side effects that usually do not require medical attention (report to your  doctor or health care professional if they continue or are bothersome): -constipation or diarrhea -headache This list may not describe all possible side effects. Call your doctor for medical advice about side effects. You may report side effects to FDA at 1-800-FDA-1088. Where should I keep my medicine? This drug is given in a hospital or clinic and will not be stored at home. NOTE: This sheet is a summary. It may not cover all possible information. If you have questions about this medicine, talk to your doctor, pharmacist, or health care provider.  2015, Elsevier/Gold Standard. (2013-10-21 10:38:36) Fosaprepitant injection  What is this medicine? FOSAPREPITANT (fos ap RE pi tant) is used together with other medicines to prevent nausea and vomiting caused by cancer treatment (chemotherapy). This medicine may be used for other purposes; ask your health care provider or pharmacist if you have questions. COMMON BRAND NAME(S): Emend What should I tell my health care provider before I take this medicine? They need to know if you have any of these conditions: -liver disease -an unusual or allergic reaction to fosaprepitant, aprepitant, medicines, foods, dyes, or preservatives -pregnant or trying to get pregnant -breast-feeding How should I use this medicine? This medicine is for injection into a vein. It is given by a health care professional in a hospital or clinic setting. Talk to your pediatrician regarding the use of this medicine in children. Special care may be needed. Overdosage: If you think you have taken too much of this medicine contact a poison control center or emergency room at once. NOTE: This medicine is only for you. Do not share this medicine with others. What if I miss a dose? This does not apply. What may interact with this medicine? Do not take this medicine with any of these medicines: -cisapride -pimozide -ranolazine This medicine may also interact with the following  medications: -diltiazem -male hormones, like estrogens or progestins and birth control pills -medicines for fungal infections like ketoconazole and itraconazole -medicines for HIV -medicines for seizures or to control epilepsy like carbamazepine or phenytoin -medicines used for sleep or anxiety disorders like alprazolam, diazepam, or midazolam -nefazodone -paroxetine -rifampin -some chemotherapy medications like etoposide, ifosfamide, vinblastine, vincristine -some antibiotics like clarithromycin, erythromycin, troleandomycin -steroid medicines like dexamethasone or methylprednisolone -tolbutamide -warfarin This list may not describe all possible interactions. Give your health care provider a list of all the medicines, herbs, non-prescription drugs, or dietary supplements you use. Also tell them if you smoke, drink alcohol, or use illegal drugs. Some items may interact with your medicine. What should I watch for while using this medicine? Do not take this medicine if you already have nausea and vomiting. Ask your health care provider what to do if you already have nausea. Birth control pills may not work properly while you are taking this medicine. Talk to your doctor about using an extra method of birth control. This medicine should not be used continuously for a long time. Visit your doctor or health care professional for regular check-ups. This medicine may change your liver function blood test results. What side effects may I notice from receiving this medicine? Side effects that you should report to your doctor or health care professional as soon as possible: -allergic reactions like skin rash, itching or hives, swelling of the face, lips, or tongue -breathing problems -changes in heart rhythm -high or low blood pressure -pain, redness, or irritation at site where injected -rectal bleeding -serious dizziness or disorientation, confusion -sharp or severe stomach pain -sharp pain  in your leg Side effects that usually do not require medical attention (report to your doctor or health care professional if they continue or are bothersome): -constipation or diarrhea -hair loss -headache -hiccups -loss of appetite -nausea -upset stomach -tiredness This list may not describe all possible side effects. Call your doctor for medical advice about side effects. You may report side effects to FDA at 1-800-FDA-1088. Where should I keep my medicine? This drug is given in a hospital or clinic and will not be stored at home. NOTE: This sheet is a summary. It may not cover all possible information. If  you have questions about this medicine, talk to your doctor, pharmacist, or health care provider.  2015, [employer] Standard. (2009-11-27 12:46:13) Dexamethasone injection What is this medicine? DEXAMETHASONE (dex a METH a sone) is a corticosteroid. It is used to treat inflammation of the skin, joints, lungs, and other organs. Common conditions treated include asthma, allergies, and arthritis. It is also used for other conditions, like blood disorders and diseases of the adrenal glands. This medicine may be used for other purposes; ask your health care provider or pharmacist if you have questions. COMMON BRAND NAME(S): Decadron, Solurex What should I tell my health care provider before I take this medicine? They need to know if you have any of these conditions: -blood clotting problems -Cushing's syndrome -diabetes -glaucoma -heart problems or disease -high blood pressure -infection like herpes, measles, tuberculosis, or chickenpox -kidney disease -liver disease -mental problems -myasthenia gravis -osteoporosis -previous heart attack -seizures -stomach, ulcer or intestine disease including colitis and diverticulitis -thyroid problem -an unusual or allergic reaction to dexamethasone, corticosteroids, other medicines, lactose, foods, dyes, or preservatives -pregnant or  trying to get pregnant -breast-feeding How should I use this medicine? This medicine is for injection into a muscle, joint, lesion, soft tissue, or vein. It is given by a health care professional in a hospital or clinic setting. Talk to your pediatrician regarding the use of this medicine in children. Special care may be needed. Overdosage: If you think you have taken too much of this medicine contact a poison control center or emergency room at once. NOTE: This medicine is only for you. Do not share this medicine with others. What if I miss a dose? This may not apply. If you are having a series of injections over a prolonged period, try not to miss an appointment. Call your doctor or health care professional to reschedule if you are unable to keep an appointment. What may interact with this medicine? Do not take this medicine with any of the following medications: -mifepristone, RU-486 -vaccines This medicine may also interact with the following medications: -amphotericin B -antibiotics like clarithromycin, erythromycin, and troleandomycin -aspirin and aspirin-like drugs -barbiturates like phenobarbital -carbamazepine -cholestyramine -cholinesterase inhibitors like donepezil, galantamine, rivastigmine, and tacrine -cyclosporine -digoxin -diuretics -ephedrine -male hormones, like estrogens or progestins and birth control pills -indinavir -isoniazid -ketoconazole -medicines for diabetes -medicines that improve muscle tone or strength for conditions like myasthenia gravis -NSAIDs, medicines for pain and inflammation, like ibuprofen or naproxen -phenytoin -rifampin -thalidomide -warfarin This list may not describe all possible interactions. Give your health care provider a list of all the medicines, herbs, non-prescription drugs, or dietary supplements you use. Also tell them if you smoke, drink alcohol, or use illegal drugs. Some items may interact with your medicine. What should  I watch for while using this medicine? Your condition will be monitored carefully while you are receiving this medicine. If you are taking this medicine for a long time, carry an identification card with your name and address, the type and dose of your medicine, and your doctor's name and address. This medicine may increase your risk of getting an infection. Stay away from people who are sick. Tell your doctor or health care professional if you are around anyone with measles or chickenpox. Talk to your health care provider before you get any vaccines that you take this medicine. If you are going to have surgery, tell your doctor or health care professional that you have taken this medicine within the last twelve months. Ask your doctor or  health care professional about your diet. You may need to lower the amount of salt you eat. The medicine can increase your blood sugar. If you are a diabetic check with your doctor if you need help adjusting the dose of your diabetic medicine. What side effects may I notice from receiving this medicine? Side effects that you should report to your doctor or health care professional as soon as possible: -allergic reactions like skin rash, itching or hives, swelling of the face, lips, or tongue -black or tarry stools -change in the amount of urine -changes in vision -confusion, excitement, restlessness, a false sense of well-being -fever, sore throat, sneezing, cough, or other signs of infection, wounds that will not heal -hallucinations -increased thirst -mental depression, mood swings, mistaken feelings of self importance or of being mistreated -pain in hips, back, ribs, arms, shoulders, or legs -pain, redness, or irritation at the injection site -redness, blistering, peeling or loosening of the skin, including inside the mouth -rounding out of face -swelling of feet or lower legs -unusual bleeding or bruising -unusual tired or weak -wounds that do not  heal Side effects that usually do not require medical attention (report to your doctor or health care professional if they continue or are bothersome): -diarrhea or constipation -change in taste -headache -nausea, vomiting -skin problems, acne, thin and shiny skin -touble sleeping -unusual growth of hair on the face or body -weight gain This list may not describe all possible side effects. Call your doctor for medical advice about side effects. You may report side effects to FDA at 1-800-FDA-1088. Where should I keep my medicine? This drug is given in a hospital or clinic and will not be stored at home. NOTE: This sheet is a summary. It may not cover all possible information. If you have questions about this medicine, talk to your doctor, pharmacist, or health care provider.  2015, [employer] Standard. (2008-04-06 14:04:12) Metoclopramide tablets What is this medicine? METOCLOPRAMIDE (met oh kloe PRA mide) is used to treat the symptoms of gastroesophageal reflux disease (GERD) like heartburn. It is also used to treat people with slow emptying of the stomach and intestinal tract. This medicine may be used for other purposes; ask your health care provider or pharmacist if you have questions. COMMON BRAND NAME(S): Reglan What should I tell my health care provider before I take this medicine? They need to know if you have any of these conditions: -breast cancer -depression -diabetes -heart failure -high blood pressure -kidney disease -liver disease -Parkinson's disease or a movement disorder -pheochromocytoma -seizures -stomach obstruction, bleeding, or perforation -an unusual or allergic reaction to metoclopramide, procainamide, sulfites, other medicines, foods, dyes, or preservatives -pregnant or trying to get pregnant -breast-feeding How should I use this medicine? Take this medicine by mouth with a glass of water. Follow the directions on the prescription label. Take this  medicine on an empty stomach, about 30 minutes before eating. Take your doses at regular intervals. Do not take your medicine more often than directed. Do not stop taking except on the advice of your doctor or health care professional. A special MedGuide will be given to you by the pharmacist with each prescription and refill. Be sure to read this information carefully each time. Talk to your pediatrician regarding the use of this medicine in children. Special care may be needed. Overdosage: If you think you have taken too much of this medicine contact a poison control center or emergency room at once. NOTE: This medicine is only for you.  Do not share this medicine with others. What if I miss a dose? If you miss a dose, take it as soon as you can. If it is almost time for your next dose, take only that dose. Do not take double or extra doses. What may interact with this medicine? -acetaminophen -cyclosporine -digoxin -medicines for blood pressure -medicines for diabetes, including insulin -medicines for hay fever and other allergies -medicines for depression, especially an Monoamine Oxidase Inhibitor (MAOI) -medicines for Parkinson's disease, like levodopa -medicines for sleep or for pain -tetracycline This list may not describe all possible interactions. Give your health care provider a list of all the medicines, herbs, non-prescription drugs, or dietary supplements you use. Also tell them if you smoke, drink alcohol, or use illegal drugs. Some items may interact with your medicine. What should I watch for while using this medicine? It may take a few weeks for your stomach condition to start to get better. However, do not take this medicine for longer than 12 weeks. The longer you take this medicine, and the more you take it, the greater your chances are of developing serious side effects. If you are an elderly patient, a male patient, or you have diabetes, you may be at an increased risk for  side effects from this medicine. Contact your doctor immediately if you start having movements you cannot control such as lip smacking, rapid movements of the tongue, involuntary or uncontrollable movements of the eyes, head, arms and legs, or muscle twitches and spasms. Patients and their families should watch out for worsening depression or thoughts of suicide. Also watch out for any sudden or severe changes in feelings such as feeling anxious, agitated, panicky, irritable, hostile, aggressive, impulsive, severely restless, overly excited and hyperactive, or not being able to sleep. If this happens, especially at the beginning of treatment or after a change in dose, call your doctor. Do not treat yourself for high fever. Ask your doctor or health care professional for advice. You may get drowsy or dizzy. Do not drive, use machinery, or do anything that needs mental alertness until you know how this drug affects you. Do not stand or sit up quickly, especially if you are an older patient. This reduces the risk of dizzy or fainting spells. Alcohol can make you more drowsy and dizzy. Avoid alcoholic drinks. What side effects may I notice from receiving this medicine? Side effects that you should report to your doctor or health care professional as soon as possible: -allergic reactions like skin rash, itching or hives, swelling of the face, lips, or tongue -abnormal production of milk in females -breast enlargement in both males and females -change in the way you walk -difficulty moving, speaking or swallowing -drooling, lip smacking, or rapid movements of the tongue -excessive sweating -fever -involuntary or uncontrollable movements of the eyes, head, arms and legs -irregular heartbeat or palpitations -muscle twitches and spasms -unusually weak or tired Side effects that usually do not require medical attention (report to your doctor or health care professional if they continue or are  bothersome): -change in sex drive or performance -depressed mood -diarrhea -difficulty sleeping -headache -menstrual changes -restless or nervous This list may not describe all possible side effects. Call your doctor for medical advice about side effects. You may report side effects to FDA at 1-800-FDA-1088. Where should I keep my medicine? Keep out of the reach of children. Store at room temperature between 20 and 25 degrees C (68 and 77 degrees F). Protect from light.  Keep container tightly closed. Throw away any unused medicine after the expiration date. NOTE: This sheet is a summary. It may not cover all possible information. If you have questions about this medicine, talk to your doctor, pharmacist, or health care provider.  2015, [employer]/Gold Standard. (2012-04-13 13:04:38) Prochlorperazine tablets What is this medicine? PROCHLORPERAZINE (proe klor PER a zeen) helps to control severe nausea and vomiting. This medicine is also used to treat schizophrenia. It can also help patients who experience anxiety that is not due to psychological illness. This medicine may be used for other purposes; ask your health care provider or pharmacist if you have questions. COMMON BRAND NAME(S): Compazine What should I tell my health care provider before I take this medicine? They need to know if you have any of these conditions: -blood disorders or disease -dementia -liver disease or jaundice -Parkinson's disease -uncontrollable movement disorder -an unusual or allergic reaction to prochlorperazine, other medicines, foods, dyes, or preservatives -pregnant or trying to get pregnant -breast-feeding How should I use this medicine? Take this medicine by mouth with a glass of water. Follow the directions on the prescription label. Take your doses at regular intervals. Do not take your medicine more often than directed. Do not stop taking this medicine suddenly. This can cause nausea, vomiting, and  dizziness. Ask your doctor or health care professional for advice. Talk to your pediatrician regarding the use of this medicine in children. Special care may be needed. While this drug may be prescribed for children as young as 2 years for selected conditions, precautions do apply. Overdosage: If you think you have taken too much of this medicine contact a poison control center or emergency room at once. NOTE: This medicine is only for you. Do not share this medicine with others. What if I miss a dose? If you miss a dose, take it as soon as you can. If it is almost time for your next dose, take only that dose. Do not take double or extra doses. What may interact with this medicine? Do not take this medicine with any of the following medications: -amoxapine -antidepressants like citalopram, escitalopram, fluoxetine, paroxetine, and sertraline -deferoxamine -dofetilide -maprotiline -tricyclic antidepressants like amitriptyline, clomipramine, imipramine, nortiptyline and others This medicine may also interact with the following medications: -lithium -medicines for pain -phenytoin -propranolol -warfarin This list may not describe all possible interactions. Give your health care provider a list of all the medicines, herbs, non-prescription drugs, or dietary supplements you use. Also tell them if you smoke, drink alcohol, or use illegal drugs. Some items may interact with your medicine. What should I watch for while using this medicine? Visit your doctor or health care professional for regular checks on your progress. You may get drowsy or dizzy. Do not drive, use machinery, or do anything that needs mental alertness until you know how this medicine affects you. Do not stand or sit up quickly, especially if you are an older patient. This reduces the risk of dizzy or fainting spells. Alcohol may interfere with the effect of this medicine. Avoid alcoholic drinks. This medicine can reduce the response  of your body to heat or cold. Dress warm in cold weather and stay hydrated in hot weather. If possible, avoid extreme temperatures like saunas, hot tubs, very hot or cold showers, or activities that can cause dehydration such as vigorous exercise. This medicine can make you more sensitive to the sun. Keep out of the sun. If you cannot avoid being in the sun, wear protective  clothing and use sunscreen. Do not use sun lamps or tanning beds/booths. Your mouth may get dry. Chewing sugarless gum or sucking hard candy, and drinking plenty of water may help. Contact your doctor if the problem does not go away or is severe. What side effects may I notice from receiving this medicine? Side effects that you should report to your doctor or health care professional as soon as possible: -blurred vision -breast enlargement in men or women -breast milk in women who are not breast-feeding -chest pain, fast or irregular heartbeat -confusion, restlessness -dark yellow or brown urine -difficulty breathing or swallowing -dizziness or fainting spells -drooling, shaking, movement difficulty (shuffling walk) or rigidity -fever, chills, sore throat -involuntary or uncontrollable movements of the eyes, mouth, head, arms, and legs -seizures -stomach area pain -unusually weak or tired -unusual bleeding or bruising -yellowing of skin or eyes Side effects that usually do not require medical attention (report to your doctor or health care professional if they continue or are bothersome): -difficulty passing urine -difficulty sleeping -headache -sexual dysfunction -skin rash, or itching This list may not describe all possible side effects. Call your doctor for medical advice about side effects. You may report side effects to FDA at 1-800-FDA-1088. Where should I keep my medicine? Keep out of the reach of children. Store at room temperature between 15 and 30 degrees C (59 and 86 degrees F). Protect from light. Throw  away any unused medicine after the expiration date. NOTE: This sheet is a summary. It may not cover all possible information. If you have questions about this medicine, talk to your doctor, pharmacist, or health care provider.  2015, [employer] Standard. (2012-05-04 16:59:39) Lidocaine; Prilocaine cream What is this medicine? LIDOCAINE; PRILOCAINE (LYE doe kane; PRIL oh kane) is a topical anesthetic that causes loss of feeling in the skin and surrounding tissues. It is used to numb the skin before procedures or injections. This medicine may be used for other purposes; ask your health care provider or pharmacist if you have questions. COMMON BRAND NAME(S): EMLA What should I tell my health care provider before I take this medicine? They need to know if you have any of these conditions: -glucose-6-phosphate deficiencies -heart disease -kidney or liver disease -methemoglobinemia -an unusual or allergic reaction to lidocaine, prilocaine, other medicines, foods, dyes, or preservatives -pregnant or trying to get pregnant -breast-feeding How should I use this medicine? This medicine is for external use only on the skin. Do not take by mouth. Follow the directions on the prescription label. Wash hands before and after use. Do not use more or leave in contact with the skin longer than directed. Do not apply to eyes or open wounds. It can cause irritation and blurred or temporary loss of vision. If this medicine comes in contact with your eyes, immediately rinse the eye with water. Do not touch or rub the eye. Contact your health care provider right away. Talk to your pediatrician regarding the use of this medicine in children. While this medicine may be prescribed for children for selected conditions, precautions do apply. Overdosage: If you think you have taken too much of this medicine contact a poison control center or emergency room at once. NOTE: This medicine is only for you. Do not share this  medicine with others. What if I miss a dose? This medicine is usually only applied once prior to each procedure. It must be in contact with the skin for a period of time for it to work. If you  applied this medicine later than directed, tell your health care professional before starting the procedure. What may interact with this medicine? -acetaminophen -chloroquine -dapsone -medicines to control heart rhythm -nitrates like nitroglycerin and nitroprusside -other ointments, creams, or sprays that may contain anesthetic medicine -phenobarbital -phenytoin -quinine -sulfonamides like sulfacetamide, sulfamethoxazole, sulfasalazine and others This list may not describe all possible interactions. Give your health care provider a list of all the medicines, herbs, non-prescription drugs, or dietary supplements you use. Also tell them if you smoke, drink alcohol, or use illegal drugs. Some items may interact with your medicine. What should I watch for while using this medicine? Be careful to avoid injury to the treated area while it is numb and you are not aware of pain. Avoid scratching, rubbing, or exposing the treated area to hot or cold temperatures until complete sensation has returned. The numb feeling will wear off a few hours after applying the cream. What side effects may I notice from receiving this medicine? Side effects that you should report to your doctor or health care professional as soon as possible: -blurred vision -chest pain -difficulty breathing -dizziness -drowsiness -fast or irregular heartbeat -skin rash or itching -swelling of your throat, lips, or face -trembling Side effects that usually do not require medical attention (report to your doctor or health care professional if they continue or are bothersome): -changes in ability to feel hot or cold -redness and swelling at the application site This list may not describe all possible side effects. Call your doctor for  medical advice about side effects. You may report side effects to FDA at 1-800-FDA-1088. Where should I keep my medicine? Keep out of reach of children. Store at room temperature between 15 and 30 degrees C (59 and 86 degrees F). Keep container tightly closed. Throw away any unused medicine after the expiration date. NOTE: This sheet is a summary. It may not cover all possible information. If you have questions about this medicine, talk to your doctor, pharmacist, or health care provider.  2015, [employer] Standard. (2008-06-19 17:14:35) Implanted Port Insertion An implanted port is a central line that has a round shape and is placed under the skin. It is used as a long-term IV access for:   Medicines, such as chemotherapy.   Fluids.   Liquid nutrition, such as total parenteral nutrition (TPN).   Blood samples.  LET [facility] CARE PROVIDER KNOW ABOUT:  Allergies to food or medicine.   Medicines taken, including vitamins, herbs, eye drops, creams, and over-the-counter medicines.   Any allergies to heparin.  Use of steroids (by mouth or creams).   Previous problems with anesthetics or numbing medicines.   History of bleeding problems or blood clots.   Previous surgery.   Other health problems, including diabetes and kidney problems.   Possibility of pregnancy, if this applies. RISKS AND COMPLICATIONS Generally, this is a safe procedure. However, as with any procedure, problems can occur. Possible problems include:  Damage to the blood vessel, bruising, or bleeding at the puncture site.   Infection.  Blood clot in the vessel that the port is in.  Breakdown of the skin over your port.  Very rarely a person may develop a condition called a pneumothorax, a collection of air in the chest that may cause one of the lungs to collapse. The placement of these catheters with the appropriate imaging guidance significantly decreases the risk of a pneumothorax.   BEFORE THE PROCEDURE   Your health care provider may want you  to have blood tests. These tests can help tell how well your kidneys and liver are working. They can also show how well your blood clots.   If you take blood thinners (anticoagulant medicines), ask your health care provider when you should stop taking them.   Make arrangements for someone to drive you home. This is necessary if you have been sedated for your procedure.  PROCEDURE  Port insertion usually takes about 30-45 minutes.   An IV needle will be inserted in your arm. Medicine for pain and medicine to help relax you (sedative) will flow directly into your body through this needle.   You will lie on an exam table, and you will be connected to monitors to keep track of your heart rate, blood pressure, and breathing throughout the procedure.  An oxygen monitoring device may be attached to your finger. Oxygen will be given.   Everything will be kept as germ free (sterile) as possible during the procedure. The skin near the point of the incision will be cleansed with antiseptic, and the area will be draped with sterile towels. The skin and deeper tissues over the port area will be made numb with a local anesthetic.  Two small cuts (incisions) will be made in the skin to insert the port. One will be made in the neck to obtain access to the vein where the catheter will lie.   Because the port reservoir will be placed under the skin, a small skin incision will be made in the upper chest, and a small pocket for the port will be made under the skin. The catheter that will be connected to the port tunnels to a large central vein in the chest. A small, raised area will remain on your body at the site of the reservoir when the procedure is complete.  The port placement will be done under imaging guidance to ensure the proper placement.  The reservoir has a silicone covering that can be punctured with a special needle.   The  port will be flushed with normal saline, and blood will be drawn to make sure it is working properly.  There will be nothing remaining outside the skin when the procedure is finished.   Incisions will be held together by stitches, surgical glue, or a special tape. AFTER THE PROCEDURE  You will stay in a recovery area until the anesthesia has worn off. Your blood pressure and pulse will be checked.  A final chest X-ray will be taken to check the placement of the port and to ensure that there is no injury to your lung. Document Released: 10/05/2013 Document Revised: 05/01/2014 Document Reviewed: 10/05/2013 [facility] Patient Information 2015 [place], [place]. This information is not intended to replace advice given to you by your health care provider. Make sure you discuss any questions you have with your health care provider.

## 2014-07-24 ENCOUNTER — Ambulatory Visit (HOSPITAL_COMMUNITY): Payer: Medicare HMO

## 2014-07-24 ENCOUNTER — Encounter (HOSPITAL_COMMUNITY): Payer: Self-pay | Admitting: Pharmacy Technician

## 2014-07-24 MED ORDER — LIDOCAINE-PRILOCAINE 2.5-2.5 % EX CREA: TOPICAL_CREAM | CUTANEOUS | Status: DC

## 2014-07-24 NOTE — Patient Instructions (Addendum)
[place]   CHEMOTHERAPY INSTRUCTIONS  Cisplatin - this medication is hard on your kidneys. This is why we give you a large amount of fluids through your IV while you are here getting chemo. We also need you drinking 64 oz of fluid (preferably water/decaff fluids) 2 days prior to chemo and for up to 4-5 days after chemo. Drink more if you can. This will help keep your kidneys flushed. This can also cause acute and/or delayed nausea/vomiting. You must take your nausea meds as prescribed if you get nauseated. Do not wait until you start vomiting. This med can also cause peripheral neuropathy (numbness/tingling/burning in hands/fingers/feet/toes). Let us know if this develops so that we can monitor it and treat if necessary.  5FU: bone marrow suppression (low white blood cells - wbcs fight infection, low red blood cells - rbcs make up your blood, low platelets - this is what makes your blood clot, nausea/vomiting, diarrhea, mouth sores, hair loss, dry skin, ocular toxicities (increased tear production, sensitivity to light). You must wear sunscreen/sunglasses. Cover your skin when out in sunlight. You will get burned very easily. (You will wear this medication for 5 days on your pump).   Premeds:  The premeds that we will be giving you are Aloxi, Emend, & Dexamethasone. Aloxi and Emend are high powered nausea medications that should continue to remain in your body for 2-3 days. Dexamethasone is a steroid given to reduce nausea & vomiting also. The side effects of Dexamethasone are the following: feeling anxious/jittery, having trouble sleeping, making you feel flushed in the face/neck. If you get the hot/flushed sensation, it should pass.   We will be giving you a liter of IV fluids prior to you getting chemo and a liter of IV fluids after you get your chemo. Plan on being here 7 hours.  We will provide you with lunch. Someone can go back to the treatment area with you  when you get your chemo as long as they are 18 or older. We limit it to 1 person. If more than 1 person is with you - they can rotate in and out. We also have snacks and soft drinks/water here for you while you are here.     POTENTIAL SIDE EFFECTS OF TREATMENT: Increased Susceptibility to Infection, Vomiting, Constipation, Hair Thinning, Changes in Character of Skin and Nails (brittleness, dryness,etc.), Pigment Changes (darkening of veins, nail beds, palms of hands, soles of feet, etc.), Bone Marrow Suppression, Complete Hair Loss, Nausea, Diarrhea, Sun Sensitivity and Mouth Sores   SELF IMAGE NEEDS AND REFERRALS MADE: Obtain hair accessories as soon as possible (caps,etc.)   EDUCATIONAL MATERIALS GIVEN AND REVIEWED: Chemotherapy and You  Specific Instructions Sheets: Cisplatin, 5FU, Aloxi, Emend, Reglan/metoclopramide, Prochlorperazine/Compazine, EMLA cream, Port-a-cath   SELF CARE ACTIVITIES WHILE ON CHEMOTHERAPY: Increase your fluid intake 48 hours prior to treatment and drink at least 2 quarts per day after treatment., No alcohol intake., No aspirin or other medications unless approved by your oncologist., Eat foods that are light and easy to digest., Eat foods at cold or room temperature., No fried, fatty, or spicy foods immediately before or after treatment., Have teeth cleaned professionally before starting treatment. Keep dentures and partial plates clean., Use soft toothbrush and do not use mouthwashes that contain alcohol. Biotene is a good mouthwash that is available at most pharmacies or may be ordered by calling [phone]., Use warm salt water gargles (1 teaspoon salt per 1 quart warm water) before  and after meals and at bedtime. Or you may rinse with 2 tablespoons of three -percent hydrogen peroxide mixed in eight ounces of water., Always use sunscreen with SPF (Sun Protection Factor) of 30 or higher., Use your nausea medication as directed to prevent nausea., Use your stool  softener or laxative as directed to prevent constipation. and Use your anti-diarrheal medication as directed to stop diarrhea.  Please wash your hands for at least 30 seconds using warm soapy water. Handwashing is the #1 way to prevent the spread of germs. Stay away from sick people or people who are getting over a cold. If you develop respiratory systems such as green/yellow mucus production or productive cough or persistent cough let us know and we will see if you need an antibiotic. It is a good idea to keep a pair of gloves on when going into grocery stores/[employer] to decrease your risk of coming into contact with germs on the carts, etc. Carry alcohol hand gel with you at all times and use it frequently if out in public. All foods need to be cooked thoroughly. No raw foods. No medium or undercooked meats, eggs. If your food is cooked medium well, it does not need to be hot pink or saturated with bloody liquid at all. Vegetables and fruits need to be washed/rinsed under the faucet with a dish detergent before being consumed. You can eat raw fruits and vegetables unless we tell you otherwise but it would be best if you cooked them or bought frozen. Do not eat off of salad bars or hot bars unless you really trust the cleanliness of the restaurant. If you need dental work, please let us know before you go for your appointment so that we can coordinate the best possible time for you in regards to your chemo regimen. You need to also let your dentist know that you are actively taking chemo. We may need to do labs prior to your dental appointment. We also want your bowels moving at least every other day. If this is not happening, we need to know so that we can get you on a bowel regimen to help you go.      MEDICATIONS: You have been given prescriptions for the following medications:  Reglan/metoclopramide 5mg  tablet. Starting the day after chemo, take 1 tablet four times a day x 48 hours. Then may take 1  tablet four times a day IF needed for nausea/vomiting.   Compazine/prochlorperazine 10mg  tablet. Starting the day after chemo, take 1 tablet four times a day x 48 hours. Then may take 1 tablet four times a day IF needed for nausea/vomiting.   EMLA cream. Apply a quarter size amount to port site 1 hour prior to chemo. Do not rub in. Cover with plastic wrap.   Over-the-Counter Meds:  Colace - this is a stool softener. Take 100mg  capsule 2-6 times a day as needed. If you have to take more than 6 capsules of Colace a day call the [facility].  Senna - this is a mild laxative used to treat mild constipation. May take 2 tabs by mouth daily or up to twice a day as needed for mild constipation.  Milk of Magnesia - this is a laxative used to treat moderate to severe constipation. May take 2-4 tablespoons every 8 hours as needed. May increase to 8 tablespoons x 1 dose and if no bowel movement call the [facility].  Imodium - this is for diarrhea. Take 2 tabs after 1st loose  stool and then 1 tab after each loose stool until you go a total of 12 hours without a loose stool. Call [facility] if loose stools continue.   SYMPTOMS TO REPORT AS SOON AS POSSIBLE AFTER TREATMENT:  FEVER GREATER THAN 100.5 F  CHILLS WITH OR WITHOUT FEVER  NAUSEA AND VOMITING THAT IS NOT CONTROLLED WITH YOUR NAUSEA MEDICATION  UNUSUAL SHORTNESS OF BREATH  UNUSUAL BRUISING OR BLEEDING  TENDERNESS IN MOUTH AND THROAT WITH OR WITHOUT PRESENCE OF ULCERS  URINARY PROBLEMS  BOWEL PROBLEMS  UNUSUAL RASH    Wear comfortable clothing and clothing appropriate for easy access to any Portacath or PICC line. Let us know if there is anything that we can do to make your therapy better!      I have been informed and understand all of the instructions given to me and have received a copy. I have been instructed to call the clinic [phone] or my family physician as soon as possible for continued medical care, if  indicated. I do not have any more questions at this time but understand that I may call the [facility] or the Patient Navigator at [phone] during office hours should I have questions or need assistance in obtaining follow-up care.           Cisplatin injection What is this medicine? CISPLATIN (SIS pla tin) is a chemotherapy drug. It targets fast dividing cells, like cancer cells, and causes these cells to die. This medicine is used to treat many types of cancer like bladder, ovarian, and testicular cancers. This medicine may be used for other purposes; ask your health care provider or pharmacist if you have questions. COMMON BRAND NAME(S): Platinol, Platinol -AQ What should I tell my health care provider before I take this medicine? They need to know if you have any of these conditions: -blood disorders -hearing problems -kidney disease -recent or ongoing radiation therapy -an unusual or allergic reaction to cisplatin, carboplatin, other chemotherapy, other medicines, foods, dyes, or preservatives -pregnant or trying to get pregnant -breast-feeding How should I use this medicine? This drug is given as an infusion into a vein. It is administered in a hospital or clinic by a specially trained health care professional. Talk to your pediatrician regarding the use of this medicine in children. Special care may be needed. Overdosage: If you think you have taken too much of this medicine contact a poison control center or emergency room at once. NOTE: This medicine is only for you. Do not share this medicine with others. What if I miss a dose? It is important not to miss a dose. Call your doctor or health care professional if you are unable to keep an appointment. What may interact with this medicine? -dofetilide -foscarnet -medicines for seizures -medicines to increase blood counts like filgrastim, pegfilgrastim, sargramostim -probenecid -pyridoxine used with  altretamine -rituximab -some antibiotics like amikacin, gentamicin, neomycin, polymyxin B, streptomycin, tobramycin -sulfinpyrazone -vaccines -zalcitabine Talk to your doctor or health care professional before taking any of these medicines: -acetaminophen -aspirin -ibuprofen -ketoprofen -naproxen This list may not describe all possible interactions. Give your health care provider a list of all the medicines, herbs, non-prescription drugs, or dietary supplements you use. Also tell them if you smoke, drink alcohol, or use illegal drugs. Some items may interact with your medicine. What should I watch for while using this medicine? Your condition will be monitored carefully while you are receiving this medicine. You will need important blood work done while  you are taking this medicine. This drug may make you feel generally unwell. This is not uncommon, as chemotherapy can affect healthy cells as well as cancer cells. Report any side effects. Continue your course of treatment even though you feel ill unless your doctor tells you to stop. In some cases, you may be given additional medicines to help with side effects. Follow all directions for their use. Call your doctor or health care professional for advice if you get a fever, chills or sore throat, or other symptoms of a cold or flu. Do not treat yourself. This drug decreases your body's ability to fight infections. Try to avoid being around people who are sick. This medicine may increase your risk to bruise or bleed. Call your doctor or health care professional if you notice any unusual bleeding. Be careful brushing and flossing your teeth or using a toothpick because you may get an infection or bleed more easily. If you have any dental work done, tell your dentist you are receiving this medicine. Avoid taking products that contain aspirin, acetaminophen, ibuprofen, naproxen, or ketoprofen unless instructed by your doctor. These medicines may hide  a fever. Do not become pregnant while taking this medicine. Women should inform their doctor if they wish to become pregnant or think they might be pregnant. There is a potential for serious side effects to an unborn child. Talk to your health care professional or pharmacist for more information. Do not breast-feed an infant while taking this medicine. Drink fluids as directed while you are taking this medicine. This will help protect your kidneys. Call your doctor or health care professional if you get diarrhea. Do not treat yourself. What side effects may I notice from receiving this medicine? Side effects that you should report to your doctor or health care professional as soon as possible: -allergic reactions like skin rash, itching or hives, swelling of the face, lips, or tongue -signs of infection - fever or chills, cough, sore throat, pain or difficulty passing urine -signs of decreased platelets or bleeding - bruising, pinpoint red spots on the skin, black, tarry stools, nosebleeds -signs of decreased red blood cells - unusually weak or tired, fainting spells, lightheadedness -breathing problems -changes in hearing -gout pain -low blood counts - This drug may decrease the number of white blood cells, red blood cells and platelets. You may be at increased risk for infections and bleeding. -nausea and vomiting -pain, swelling, redness or irritation at the injection site -pain, tingling, numbness in the hands or feet -problems with balance, movement -trouble passing urine or change in the amount of urine Side effects that usually do not require medical attention (report to your doctor or health care professional if they continue or are bothersome): -changes in vision -loss of appetite -metallic taste in the mouth or changes in taste This list may not describe all possible side effects. Call your doctor for medical advice about side effects. You may report side effects to FDA at  1-800-FDA-1088. Where should I keep my medicine? This drug is given in a hospital or clinic and will not be stored at home. NOTE: This sheet is a summary. It may not cover all possible information. If you have questions about this medicine, talk to your doctor, pharmacist, or health care provider.  2015, Elsevier/Gold Standard. (2008-03-21 14:40:54) Fluorouracil, 5-FU injection What is this medicine? FLUOROURACIL, 5-FU (flure oh YOOR a sil) is a chemotherapy drug. It slows the growth of cancer cells. This medicine is used to treat  many types of cancer like breast cancer, colon or rectal cancer, pancreatic cancer, and stomach cancer. This medicine may be used for other purposes; ask your health care provider or pharmacist if you have questions. COMMON BRAND NAME(S): Adrucil What should I tell my health care provider before I take this medicine? They need to know if you have any of these conditions: -blood disorders -dihydropyrimidine dehydrogenase (DPD) deficiency -infection (especially a virus infection such as chickenpox, cold sores, or herpes) -kidney disease -liver disease -malnourished, poor nutrition -recent or ongoing radiation therapy -an unusual or allergic reaction to fluorouracil, other chemotherapy, other medicines, foods, dyes, or preservatives -pregnant or trying to get pregnant -breast-feeding How should I use this medicine? This drug is given as an infusion or injection into a vein. It is administered in a hospital or clinic by a specially trained health care professional. Talk to your pediatrician regarding the use of this medicine in children. Special care may be needed. Overdosage: If you think you have taken too much of this medicine contact a poison control center or emergency room at once. NOTE: This medicine is only for you. Do not share this medicine with others. What if I miss a dose? It is important not to miss your dose. Call your doctor or health care  professional if you are unable to keep an appointment. What may interact with this medicine? -allopurinol -cimetidine -dapsone -digoxin -hydroxyurea -leucovorin -levamisole -medicines for seizures like ethotoin, fosphenytoin, phenytoin -medicines to increase blood counts like filgrastim, pegfilgrastim, sargramostim -medicines that treat or prevent blood clots like warfarin, enoxaparin, and dalteparin -methotrexate -metronidazole -pyrimethamine -some other chemotherapy drugs like busulfan, cisplatin, estramustine, vinblastine -trimethoprim -trimetrexate -vaccines Talk to your doctor or health care professional before taking any of these medicines: -acetaminophen -aspirin -ibuprofen -ketoprofen -naproxen This list may not describe all possible interactions. Give your health care provider a list of all the medicines, herbs, non-prescription drugs, or dietary supplements you use. Also tell them if you smoke, drink alcohol, or use illegal drugs. Some items may interact with your medicine. What should I watch for while using this medicine? Visit your doctor for checks on your progress. This drug may make you feel generally unwell. This is not uncommon, as chemotherapy can affect healthy cells as well as cancer cells. Report any side effects. Continue your course of treatment even though you feel ill unless your doctor tells you to stop. In some cases, you may be given additional medicines to help with side effects. Follow all directions for their use. Call your doctor or health care professional for advice if you get a fever, chills or sore throat, or other symptoms of a cold or flu. Do not treat yourself. This drug decreases your body's ability to fight infections. Try to avoid being around people who are sick. This medicine may increase your risk to bruise or bleed. Call your doctor or health care professional if you notice any unusual bleeding. Be careful brushing and flossing your teeth  or using a toothpick because you may get an infection or bleed more easily. If you have any dental work done, tell your dentist you are receiving this medicine. Avoid taking products that contain aspirin, acetaminophen, ibuprofen, naproxen, or ketoprofen unless instructed by your doctor. These medicines may hide a fever. Do not become pregnant while taking this medicine. Women should inform their doctor if they wish to become pregnant or think they might be pregnant. There is a potential for serious side effects to an unborn child.  Talk to your health care professional or pharmacist for more information. Do not breast-feed an infant while taking this medicine. Men should inform their doctor if they wish to father a child. This medicine may lower sperm counts. Do not treat diarrhea with over the counter products. Contact your doctor if you have diarrhea that lasts more than 2 days or if it is severe and watery. This medicine can make you more sensitive to the sun. Keep out of the sun. If you cannot avoid being in the sun, wear protective clothing and use sunscreen. Do not use sun lamps or tanning beds/booths. What side effects may I notice from receiving this medicine? Side effects that you should report to your doctor or health care professional as soon as possible: -allergic reactions like skin rash, itching or hives, swelling of the face, lips, or tongue -low blood counts - this medicine may decrease the number of white blood cells, red blood cells and platelets. You may be at increased risk for infections and bleeding. -signs of infection - fever or chills, cough, sore throat, pain or difficulty passing urine -signs of decreased platelets or bleeding - bruising, pinpoint red spots on the skin, black, tarry stools, blood in the urine -signs of decreased red blood cells - unusually weak or tired, fainting spells, lightheadedness -breathing problems -changes in vision -chest pain -mouth  sores -nausea and vomiting -pain, swelling, redness at site where injected -pain, tingling, numbness in the hands or feet -redness, swelling, or sores on hands or feet -stomach pain -unusual bleeding Side effects that usually do not require medical attention (report to your doctor or health care professional if they continue or are bothersome): -changes in finger or toe nails -diarrhea -dry or itchy skin -hair loss -headache -loss of appetite -sensitivity of eyes to the light -stomach upset -unusually teary eyes This list may not describe all possible side effects. Call your doctor for medical advice about side effects. You may report side effects to FDA at 1-800-FDA-1088. Where should I keep my medicine? This drug is given in a hospital or clinic and will not be stored at home. NOTE: This sheet is a summary. It may not cover all possible information. If you have questions about this medicine, talk to your doctor, pharmacist, or health care provider.  2015, Elsevier/Gold Standard. (2008-04-19 13:53:16) Palonosetron Injection What is this medicine? PALONOSETRON (pal oh NOE se tron) is used to prevent nausea and vomiting caused by chemotherapy. It also helps prevent delayed nausea and vomiting that may occur a few days after your treatment. This medicine may be used for other purposes; ask your health care provider or pharmacist if you have questions. COMMON BRAND NAME(S): Aloxi What should I tell my health care provider before I take this medicine? They need to know if you have any of these conditions: -an unusual or allergic reaction to palonosetron, dolasetron, granisetron, ondansetron, other medicines, foods, dyes, or preservatives -pregnant or trying to get pregnant -breast-feeding How should I use this medicine? This medicine is for infusion into a vein. It is given by a health care professional in a hospital or clinic setting. Talk to your pediatrician regarding the use of  this medicine in children. While this drug may be prescribed for children as young as 1 month for selected conditions, precautions do apply. Overdosage: If you think you have taken too much of this medicine contact a poison control center or emergency room at once. NOTE: This medicine is only for you. Do not  share this medicine with others. What if I miss a dose? This does not apply. What may interact with this medicine? -certain medicines for depression, anxiety, or psychotic disturbances -fentanyl -linezolid -MAOIs like Carbex, Eldepryl, Marplan, Nardil, and Parnate -methylene blue (injected into a vein) -tramadol This list may not describe all possible interactions. Give your health care provider a list of all the medicines, herbs, non-prescription drugs, or dietary supplements you use. Also tell them if you smoke, drink alcohol, or use illegal drugs. Some items may interact with your medicine. What should I watch for while using this medicine? Your condition will be monitored carefully while you are receiving this medicine. What side effects may I notice from receiving this medicine? Side effects that you should report to your doctor or health care professional as soon as possible: -allergic reactions like skin rash, itching or hives, swelling of the face, lips, or tongue -breathing problems -confusion -dizziness -fast, irregular heartbeat -fever and chills -loss of balance or coordination -seizures -sweating -swelling of the hands and feet -tremors -unusually weak or tired Side effects that usually do not require medical attention (report to your doctor or health care professional if they continue or are bothersome): -constipation or diarrhea -headache This list may not describe all possible side effects. Call your doctor for medical advice about side effects. You may report side effects to FDA at 1-800-FDA-1088. Where should I keep my medicine? This drug is given in a hospital  or clinic and will not be stored at home. NOTE: This sheet is a summary. It may not cover all possible information. If you have questions about this medicine, talk to your doctor, pharmacist, or health care provider.  2015, Elsevier/Gold Standard. (2013-10-21 10:38:36) Fosaprepitant injection What is this medicine? FOSAPREPITANT (fos ap RE pi tant) is used together with other medicines to prevent nausea and vomiting caused by cancer treatment (chemotherapy). This medicine may be used for other purposes; ask your health care provider or pharmacist if you have questions. COMMON BRAND NAME(S): Emend What should I tell my health care provider before I take this medicine? They need to know if you have any of these conditions: -liver disease -an unusual or allergic reaction to fosaprepitant, aprepitant, medicines, foods, dyes, or preservatives -pregnant or trying to get pregnant -breast-feeding How should I use this medicine? This medicine is for injection into a vein. It is given by a health care professional in a hospital or clinic setting. Talk to your pediatrician regarding the use of this medicine in children. Special care may be needed. Overdosage: If you think you have taken too much of this medicine contact a poison control center or emergency room at once. NOTE: This medicine is only for you. Do not share this medicine with others. What if I miss a dose? This does not apply. What may interact with this medicine? Do not take this medicine with any of these medicines: -cisapride -pimozide -ranolazine This medicine may also interact with the following medications: -diltiazem -male hormones, like estrogens or progestins and birth control pills -medicines for fungal infections like ketoconazole and itraconazole -medicines for HIV -medicines for seizures or to control epilepsy like carbamazepine or phenytoin -medicines used for sleep or anxiety disorders like alprazolam, diazepam, or  midazolam -nefazodone -paroxetine -rifampin -some chemotherapy medications like etoposide, ifosfamide, vinblastine, vincristine -some antibiotics like clarithromycin, erythromycin, troleandomycin -steroid medicines like dexamethasone or methylprednisolone -tolbutamide -warfarin This list may not describe all possible interactions. Give your health care provider a list of all the  medicines, herbs, non-prescription drugs, or dietary supplements you use. Also tell them if you smoke, drink alcohol, or use illegal drugs. Some items may interact with your medicine. What should I watch for while using this medicine? Do not take this medicine if you already have nausea and vomiting. Ask your health care provider what to do if you already have nausea. Birth control pills may not work properly while you are taking this medicine. Talk to your doctor about using an extra method of birth control. This medicine should not be used continuously for a long time. Visit your doctor or health care professional for regular check-ups. This medicine may change your liver function blood test results. What side effects may I notice from receiving this medicine? Side effects that you should report to your doctor or health care professional as soon as possible: -allergic reactions like skin rash, itching or hives, swelling of the face, lips, or tongue -breathing problems -changes in heart rhythm -high or low blood pressure -pain, redness, or irritation at site where injected -rectal bleeding -serious dizziness or disorientation, confusion -sharp or severe stomach pain -sharp pain in your leg Side effects that usually do not require medical attention (report to your doctor or health care professional if they continue or are bothersome): -constipation or diarrhea -hair loss -headache -hiccups -loss of appetite -nausea -upset stomach -tiredness This list may not describe all possible side effects. Call your  doctor for medical advice about side effects. You may report side effects to FDA at 1-800-FDA-1088. Where should I keep my medicine? This drug is given in a hospital or clinic and will not be stored at home. NOTE: This sheet is a summary. It may not cover all possible information. If you have questions about this medicine, talk to your doctor, pharmacist, or health care provider.  2015, Elsevier/Gold Standard. (2009-11-27 12:46:13) Metoclopramide tablets What is this medicine? METOCLOPRAMIDE (met oh kloe PRA mide) is used to treat the symptoms of gastroesophageal reflux disease (GERD) like heartburn. It is also used to treat people with slow emptying of the stomach and intestinal tract. This medicine may be used for other purposes; ask your health care provider or pharmacist if you have questions. COMMON BRAND NAME(S): Reglan What should I tell my health care provider before I take this medicine? They need to know if you have any of these conditions: -breast cancer -depression -diabetes -heart failure -high blood pressure -kidney disease -liver disease -Parkinson's disease or a movement disorder -pheochromocytoma -seizures -stomach obstruction, bleeding, or perforation -an unusual or allergic reaction to metoclopramide, procainamide, sulfites, other medicines, foods, dyes, or preservatives -pregnant or trying to get pregnant -breast-feeding How should I use this medicine? Take this medicine by mouth with a glass of water. Follow the directions on the prescription label. Take this medicine on an empty stomach, about 30 minutes before eating. Take your doses at regular intervals. Do not take your medicine more often than directed. Do not stop taking except on the advice of your doctor or health care professional. A special MedGuide will be given to you by the pharmacist with each prescription and refill. Be sure to read this information carefully each time. Talk to your pediatrician  regarding the use of this medicine in children. Special care may be needed. Overdosage: If you think you have taken too much of this medicine contact a poison control center or emergency room at once. NOTE: This medicine is only for you. Do not share this medicine with others. What if  I miss a dose? If you miss a dose, take it as soon as you can. If it is almost time for your next dose, take only that dose. Do not take double or extra doses. What may interact with this medicine? -acetaminophen -cyclosporine -digoxin -medicines for blood pressure -medicines for diabetes, including insulin -medicines for hay fever and other allergies -medicines for depression, especially an Monoamine Oxidase Inhibitor (MAOI) -medicines for Parkinson's disease, like levodopa -medicines for sleep or for pain -tetracycline This list may not describe all possible interactions. Give your health care provider a list of all the medicines, herbs, non-prescription drugs, or dietary supplements you use. Also tell them if you smoke, drink alcohol, or use illegal drugs. Some items may interact with your medicine. What should I watch for while using this medicine? It may take a few weeks for your stomach condition to start to get better. However, do not take this medicine for longer than 12 weeks. The longer you take this medicine, and the more you take it, the greater your chances are of developing serious side effects. If you are an elderly patient, a male patient, or you have diabetes, you may be at an increased risk for side effects from this medicine. Contact your doctor immediately if you start having movements you cannot control such as lip smacking, rapid movements of the tongue, involuntary or uncontrollable movements of the eyes, head, arms and legs, or muscle twitches and spasms. Patients and their families should watch out for worsening depression or thoughts of suicide. Also watch out for any sudden or severe  changes in feelings such as feeling anxious, agitated, panicky, irritable, hostile, aggressive, impulsive, severely restless, overly excited and hyperactive, or not being able to sleep. If this happens, especially at the beginning of treatment or after a change in dose, call your doctor. Do not treat yourself for high fever. Ask your doctor or health care professional for advice. You may get drowsy or dizzy. Do not drive, use machinery, or do anything that needs mental alertness until you know how this drug affects you. Do not stand or sit up quickly, especially if you are an older patient. This reduces the risk of dizzy or fainting spells. Alcohol can make you more drowsy and dizzy. Avoid alcoholic drinks. What side effects may I notice from receiving this medicine? Side effects that you should report to your doctor or health care professional as soon as possible: -allergic reactions like skin rash, itching or hives, swelling of the face, lips, or tongue -abnormal production of milk in females -breast enlargement in both males and females -change in the way you walk -difficulty moving, speaking or swallowing -drooling, lip smacking, or rapid movements of the tongue -excessive sweating -fever -involuntary or uncontrollable movements of the eyes, head, arms and legs -irregular heartbeat or palpitations -muscle twitches and spasms -unusually weak or tired Side effects that usually do not require medical attention (report to your doctor or health care professional if they continue or are bothersome): -change in sex drive or performance -depressed mood -diarrhea -difficulty sleeping -headache -menstrual changes -restless or nervous This list may not describe all possible side effects. Call your doctor for medical advice about side effects. You may report side effects to FDA at 1-800-FDA-1088. Where should I keep my medicine? Keep out of the reach of children. Store at room temperature between  20 and 25 degrees C (68 and 77 degrees F). Protect from light. Keep container tightly closed. Throw away any unused medicine  after the expiration date. NOTE: This sheet is a summary. It may not cover all possible information. If you have questions about this medicine, talk to your doctor, pharmacist, or health care provider.  2015, Elsevier/Gold Standard. (2012-04-13 13:04:38) Prochlorperazine tablets What is this medicine? PROCHLORPERAZINE (proe klor PER a zeen) helps to control severe nausea and vomiting. This medicine is also used to treat schizophrenia. It can also help patients who experience anxiety that is not due to psychological illness. This medicine may be used for other purposes; ask your health care provider or pharmacist if you have questions. COMMON BRAND NAME(S): Compazine What should I tell my health care provider before I take this medicine? They need to know if you have any of these conditions: -blood disorders or disease -dementia -liver disease or jaundice -Parkinson's disease -uncontrollable movement disorder -an unusual or allergic reaction to prochlorperazine, other medicines, foods, dyes, or preservatives -pregnant or trying to get pregnant -breast-feeding How should I use this medicine? Take this medicine by mouth with a glass of water. Follow the directions on the prescription label. Take your doses at regular intervals. Do not take your medicine more often than directed. Do not stop taking this medicine suddenly. This can cause nausea, vomiting, and dizziness. Ask your doctor or health care professional for advice. Talk to your pediatrician regarding the use of this medicine in children. Special care may be needed. While this drug may be prescribed for children as young as 2 years for selected conditions, precautions do apply. Overdosage: If you think you have taken too much of this medicine contact a poison control center or emergency room at once. NOTE: This  medicine is only for you. Do not share this medicine with others. What if I miss a dose? If you miss a dose, take it as soon as you can. If it is almost time for your next dose, take only that dose. Do not take double or extra doses. What may interact with this medicine? Do not take this medicine with any of the following medications: -amoxapine -antidepressants like citalopram, escitalopram, fluoxetine, paroxetine, and sertraline -deferoxamine -dofetilide -maprotiline -tricyclic antidepressants like amitriptyline, clomipramine, imipramine, nortiptyline and others This medicine may also interact with the following medications: -lithium -medicines for pain -phenytoin -propranolol -warfarin This list may not describe all possible interactions. Give your health care provider a list of all the medicines, herbs, non-prescription drugs, or dietary supplements you use. Also tell them if you smoke, drink alcohol, or use illegal drugs. Some items may interact with your medicine. What should I watch for while using this medicine? Visit your doctor or health care professional for regular checks on your progress. You may get drowsy or dizzy. Do not drive, use machinery, or do anything that needs mental alertness until you know how this medicine affects you. Do not stand or sit up quickly, especially if you are an older patient. This reduces the risk of dizzy or fainting spells. Alcohol may interfere with the effect of this medicine. Avoid alcoholic drinks. This medicine can reduce the response of your body to heat or cold. Dress warm in cold weather and stay hydrated in hot weather. If possible, avoid extreme temperatures like saunas, hot tubs, very hot or cold showers, or activities that can cause dehydration such as vigorous exercise. This medicine can make you more sensitive to the sun. Keep out of the sun. If you cannot avoid being in the sun, wear protective clothing and use sunscreen. Do not use sun  lamps  or tanning beds/booths. Your mouth may get dry. Chewing sugarless gum or sucking hard candy, and drinking plenty of water may help. Contact your doctor if the problem does not go away or is severe. What side effects may I notice from receiving this medicine? Side effects that you should report to your doctor or health care professional as soon as possible: -blurred vision -breast enlargement in men or women -breast milk in women who are not breast-feeding -chest pain, fast or irregular heartbeat -confusion, restlessness -dark yellow or brown urine -difficulty breathing or swallowing -dizziness or fainting spells -drooling, shaking, movement difficulty (shuffling walk) or rigidity -fever, chills, sore throat -involuntary or uncontrollable movements of the eyes, mouth, head, arms, and legs -seizures -stomach area pain -unusually weak or tired -unusual bleeding or bruising -yellowing of skin or eyes Side effects that usually do not require medical attention (report to your doctor or health care professional if they continue or are bothersome): -difficulty passing urine -difficulty sleeping -headache -sexual dysfunction -skin rash, or itching This list may not describe all possible side effects. Call your doctor for medical advice about side effects. You may report side effects to FDA at 1-800-FDA-1088. Where should I keep my medicine? Keep out of the reach of children. Store at room temperature between 15 and 30 degrees C (59 and 86 degrees F). Protect from light. Throw away any unused medicine after the expiration date. NOTE: This sheet is a summary. It may not cover all possible information. If you have questions about this medicine, talk to your doctor, pharmacist, or health care provider.  2015, Elsevier/Gold Standard. (2012-05-04 16:59:39) Lidocaine; Prilocaine cream What is this medicine? LIDOCAINE; PRILOCAINE (LYE doe kane; PRIL oh kane) is a topical anesthetic that  causes loss of feeling in the skin and surrounding tissues. It is used to numb the skin before procedures or injections. This medicine may be used for other purposes; ask your health care provider or pharmacist if you have questions. COMMON BRAND NAME(S): EMLA What should I tell my health care provider before I take this medicine? They need to know if you have any of these conditions: -glucose-6-phosphate deficiencies -heart disease -kidney or liver disease -methemoglobinemia -an unusual or allergic reaction to lidocaine, prilocaine, other medicines, foods, dyes, or preservatives -pregnant or trying to get pregnant -breast-feeding How should I use this medicine? This medicine is for external use only on the skin. Do not take by mouth. Follow the directions on the prescription label. Wash hands before and after use. Do not use more or leave in contact with the skin longer than directed. Do not apply to eyes or open wounds. It can cause irritation and blurred or temporary loss of vision. If this medicine comes in contact with your eyes, immediately rinse the eye with water. Do not touch or rub the eye. Contact your health care provider right away. Talk to your pediatrician regarding the use of this medicine in children. While this medicine may be prescribed for children for selected conditions, precautions do apply. Overdosage: If you think you have taken too much of this medicine contact a poison control center or emergency room at once. NOTE: This medicine is only for you. Do not share this medicine with others. What if I miss a dose? This medicine is usually only applied once prior to each procedure. It must be in contact with the skin for a period of time for it to work. If you applied this medicine later than directed, tell your health care  professional before starting the procedure. What may interact with this medicine? -acetaminophen -chloroquine -dapsone -medicines to control heart  rhythm -nitrates like nitroglycerin and nitroprusside -other ointments, creams, or sprays that may contain anesthetic medicine -phenobarbital -phenytoin -quinine -sulfonamides like sulfacetamide, sulfamethoxazole, sulfasalazine and others This list may not describe all possible interactions. Give your health care provider a list of all the medicines, herbs, non-prescription drugs, or dietary supplements you use. Also tell them if you smoke, drink alcohol, or use illegal drugs. Some items may interact with your medicine. What should I watch for while using this medicine? Be careful to avoid injury to the treated area while it is numb and you are not aware of pain. Avoid scratching, rubbing, or exposing the treated area to hot or cold temperatures until complete sensation has returned. The numb feeling will wear off a few hours after applying the cream. What side effects may I notice from receiving this medicine? Side effects that you should report to your doctor or health care professional as soon as possible: -blurred vision -chest pain -difficulty breathing -dizziness -drowsiness -fast or irregular heartbeat -skin rash or itching -swelling of your throat, lips, or face -trembling Side effects that usually do not require medical attention (report to your doctor or health care professional if they continue or are bothersome): -changes in ability to feel hot or cold -redness and swelling at the application site This list may not describe all possible side effects. Call your doctor for medical advice about side effects. You may report side effects to FDA at 1-800-FDA-1088. Where should I keep my medicine? Keep out of reach of children. Store at room temperature between 15 and 30 degrees C (59 and 86 degrees F). Keep container tightly closed. Throw away any unused medicine after the expiration date. NOTE: This sheet is a summary. It may not cover all possible information. If you have  questions about this medicine, talk to your doctor, pharmacist, or health care provider.  2015, Elsevier/Gold Standard. (2008-06-19 17:14:35) Implanted Port Insertion An implanted port is a central line that has a round shape and is placed under the skin. It is used as a long-term IV access for:   Medicines, such as chemotherapy.   Fluids.   Liquid nutrition, such as total parenteral nutrition (TPN).   Blood samples.  LET [facility] CARE PROVIDER KNOW ABOUT:  Allergies to food or medicine.   Medicines taken, including vitamins, herbs, eye drops, creams, and over-the-counter medicines.   Any allergies to heparin.  Use of steroids (by mouth or creams).   Previous problems with anesthetics or numbing medicines.   History of bleeding problems or blood clots.   Previous surgery.   Other health problems, including diabetes and kidney problems.   Possibility of pregnancy, if this applies. RISKS AND COMPLICATIONS Generally, this is a safe procedure. However, as with any procedure, problems can occur. Possible problems include:  Damage to the blood vessel, bruising, or bleeding at the puncture site.   Infection.  Blood clot in the vessel that the port is in.  Breakdown of the skin over your port.  Very rarely a person may develop a condition called a pneumothorax, a collection of air in the chest that may cause one of the lungs to collapse. The placement of these catheters with the appropriate imaging guidance significantly decreases the risk of a pneumothorax.  BEFORE THE PROCEDURE   Your health care provider may want you to have blood tests. These tests can help tell how  well your kidneys and liver are working. They can also show how well your blood clots.   If you take blood thinners (anticoagulant medicines), ask your health care provider when you should stop taking them.   Make arrangements for someone to drive you home. This is necessary if you have been  sedated for your procedure.  PROCEDURE  Port insertion usually takes about 30-45 minutes.   An IV needle will be inserted in your arm. Medicine for pain and medicine to help relax you (sedative) will flow directly into your body through this needle.   You will lie on an exam table, and you will be connected to monitors to keep track of your heart rate, blood pressure, and breathing throughout the procedure.  An oxygen monitoring device may be attached to your finger. Oxygen will be given.   Everything will be kept as germ free (sterile) as possible during the procedure. The skin near the point of the incision will be cleansed with antiseptic, and the area will be draped with sterile towels. The skin and deeper tissues over the port area will be made numb with a local anesthetic.  Two small cuts (incisions) will be made in the skin to insert the port. One will be made in the neck to obtain access to the vein where the catheter will lie.   Because the port reservoir will be placed under the skin, a small skin incision will be made in the upper chest, and a small pocket for the port will be made under the skin. The catheter that will be connected to the port tunnels to a large central vein in the chest. A small, raised area will remain on your body at the site of the reservoir when the procedure is complete.  The port placement will be done under imaging guidance to ensure the proper placement.  The reservoir has a silicone covering that can be punctured with a special needle.   The port will be flushed with normal saline, and blood will be drawn to make sure it is working properly.  There will be nothing remaining outside the skin when the procedure is finished.   Incisions will be held together by stitches, surgical glue, or a special tape. AFTER THE PROCEDURE  You will stay in a recovery area until the anesthesia has worn off. Your blood pressure and pulse will be checked.  A  final chest X-ray will be taken to check the placement of the port and to ensure that there is no injury to your lung. Document Released: 10/05/2013 Document Revised: 05/01/2014 Document Reviewed: 10/05/2013 [facility] Patient Information 2015 [place], [place]. This information is not intended to replace advice given to you by your health care provider. Make sure you discuss any questions you have with your health care provider. Chemotherapy Many people are apprehensive about chemotherapy due to concerns over uncomfortable side effects. However, managements for side effects have come a long way. Many side effects once associated with chemotherapy can be prevented and/or controlled. WHAT IS CHEMOTHERAPY? Chemotherapy is the general term for any treatment involving the use of chemical agents. Chemotherapy can be given through a vein, most commonly through an implanted port* or PICC line.* It can also be delivered by mouth (orally) in the form of a pill. The main goal of chemotherapy is to kill cancer cells and stop them from growing. It can destroy and eliminate cancer cells where the cancer started (primary tumor location) and throughout the body, often far away  from the original cancer. It is a treatment that not only targets the original cancer location, but also the entire body (systemic treatment) for full effect and results. Chemotherapy works by destroying cancer cells. Unfortunately, it cannot tell the difference between a cancer cell and some healthy cells. This results in the death of noncancerous cells, such as hair and blood cells. Harm to healthy cells is what causes side effects. These cells usually repair themselves after chemotherapy. Because some drugs work better together rather than alone, 2 or more drugs are often given at the same time. This is called combination chemotherapy. Depending on the type of cancer and how advanced it is, chemotherapy can be used for different goals:  Cure the  cancer.  Keep the cancer from spreading.  Slow the cancer's growth.  Kill cancer cells that may have spread to other parts of the body from the original tumor.  Relieve symptoms caused by cancer. You and your caregiver will decide what drug or combination of drugs you will get. Your caregiver will choose the doses, how the drugs will be given, how often, and how long you will get treatment. All of these decisions will depend on the type of cancer, where it is, how big it is, and how it is affecting your normal body functions and overall health. *Implanted port - A device that is implanted under your skin so that medicines may be delivered directly into your blood system. *PICC line (peripherally inserted central catheter) - A long, slender, flexible tube. This tube is often inserted into a vein, typically in the upper arm. The tip stops in the large central vein that leads to your heart. Document Released: 10/12/2007 Document Revised: 03/08/2012 Document Reviewed: 03/29/2009 [facility] Patient Information 2015 [place], [place]. This information is not intended to replace advice given to you by your health care provider. Make sure you discuss any questions you have with your health care provider. Chemotherapy Many people are apprehensive about chemotherapy due to concerns over uncomfortable side effects. However, managements for side effects have come a long way. Many side effects once associated with chemotherapy can be prevented and/or controlled. WHAT IS CHEMOTHERAPY? Chemotherapy is the general term for any treatment involving the use of chemical agents. Chemotherapy can be given through a vein, most commonly through an implanted port* or PICC line.* It can also be delivered by mouth (orally) in the form of a pill. The main goal of chemotherapy is to kill cancer cells and stop them from growing. It can destroy and eliminate cancer cells where the cancer started (primary tumor location) and  throughout the body, often far away from the original cancer. It is a treatment that not only targets the original cancer location, but also the entire body (systemic treatment) for full effect and results. Chemotherapy works by destroying cancer cells. Unfortunately, it cannot tell the difference between a cancer cell and some healthy cells. This results in the death of noncancerous cells, such as hair and blood cells. Harm to healthy cells is what causes side effects. These cells usually repair themselves after chemotherapy. Because some drugs work better together rather than alone, 2 or more drugs are often given at the same time. This is called combination chemotherapy. Depending on the type of cancer and how advanced it is, chemotherapy can be used for different goals:  Cure the cancer.  Keep the cancer from spreading.  Slow the cancer's growth.  Kill cancer cells that may have spread to other parts of the  body from the original tumor.  Relieve symptoms caused by cancer. You and your caregiver will decide what drug or combination of drugs you will get. Your caregiver will choose the doses, how the drugs will be given, how often, and how long you will get treatment. All of these decisions will depend on the type of cancer, where it is, how big it is, and how it is affecting your normal body functions and overall health. *Implanted port - A device that is implanted under your skin so that medicines may be delivered directly into your blood system. *PICC line (peripherally inserted central catheter) - A long, slender, flexible tube. This tube is often inserted into a vein, typically in the upper arm. The tip stops in the large central vein that leads to your heart. Document Released: 10/12/2007 Document Revised: 03/08/2012 Document Reviewed: 03/29/2009 [facility] Patient Information 2015 [place], [place]. This information is not intended to replace advice given to you by your health care provider.  Make sure you discuss any questions you have with your health care provider.

## 2014-07-25 ENCOUNTER — Other Ambulatory Visit (HOSPITAL_COMMUNITY): Payer: Self-pay | Admitting: Oncology

## 2014-07-25 ENCOUNTER — Other Ambulatory Visit (HOSPITAL_COMMUNITY): Payer: Self-pay | Admitting: Hematology and Oncology

## 2014-07-25 ENCOUNTER — Encounter (HOSPITAL_BASED_OUTPATIENT_CLINIC_OR_DEPARTMENT_OTHER): Payer: Medicare HMO

## 2014-07-25 ENCOUNTER — Other Ambulatory Visit: Payer: Self-pay | Admitting: Radiology

## 2014-07-25 DIAGNOSIS — C770 Secondary and unspecified malignant neoplasm of lymph nodes of head, face and neck: Secondary | ICD-10-CM

## 2014-07-25 DIAGNOSIS — C801 Malignant (primary) neoplasm, unspecified: Secondary | ICD-10-CM

## 2014-07-25 MED ORDER — HYDROCODONE-ACETAMINOPHEN 5-325 MG PO TABS: 1.0000 | ORAL_TABLET | Freq: Four times a day (QID) | ORAL | Status: DC | PRN

## 2014-07-25 NOTE — Progress Notes (Signed)
Chemo teaching done and consent signed for Cisplatin & 5FU. Distress screening done. Med/chemo calendar given.

## 2014-07-26 ENCOUNTER — Other Ambulatory Visit (HOSPITAL_COMMUNITY): Payer: Self-pay | Admitting: Oncology

## 2014-07-26 ENCOUNTER — Ambulatory Visit (HOSPITAL_COMMUNITY)
Admission: RE | Admit: 2014-07-26 | Discharge: 2014-07-26 | Disposition: A | Discharge status: Home / Self Care | Payer: Medicare HMO | Source: Ambulatory Visit | Attending: Oncology | Admitting: Oncology

## 2014-07-26 ENCOUNTER — Encounter (HOSPITAL_COMMUNITY): Payer: Self-pay

## 2014-07-26 ENCOUNTER — Ambulatory Visit (HOSPITAL_COMMUNITY)
Admission: RE | Admit: 2014-07-26 | Discharge: 2014-07-26 | Disposition: A | Discharge status: Home / Self Care | Payer: Medicare HMO | Source: Ambulatory Visit | Attending: Hematology and Oncology | Admitting: Hematology and Oncology

## 2014-07-26 DIAGNOSIS — C770 Secondary and unspecified malignant neoplasm of lymph nodes of head, face and neck: Secondary | ICD-10-CM

## 2014-07-26 DIAGNOSIS — Z87891 Personal history of nicotine dependence: Secondary | ICD-10-CM | POA: Diagnosis not present

## 2014-07-26 DIAGNOSIS — C8581 Other specified types of non-Hodgkin lymphoma, lymph nodes of head, face, and neck: Secondary | ICD-10-CM | POA: Insufficient documentation

## 2014-07-26 DIAGNOSIS — I10 Essential (primary) hypertension: Secondary | ICD-10-CM | POA: Insufficient documentation

## 2014-07-26 DIAGNOSIS — H543 Unqualified visual loss, both eyes: Secondary | ICD-10-CM | POA: Insufficient documentation

## 2014-07-26 DIAGNOSIS — E039 Hypothyroidism, unspecified: Secondary | ICD-10-CM | POA: Diagnosis not present

## 2014-07-26 DIAGNOSIS — Z8546 Personal history of malignant neoplasm of prostate: Secondary | ICD-10-CM | POA: Diagnosis not present

## 2014-07-26 LAB — CBC WITH DIFFERENTIAL/PLATELET
BASOS ABS: 0 10*3/uL (ref 0.0–0.1)
Basophils Relative: 0 % (ref 0–1)
Eosinophils Absolute: 0.1 10*3/uL (ref 0.0–0.7)
Eosinophils Relative: 1 % (ref 0–5)
HCT: 44 % (ref 39.0–52.0)
Hemoglobin: 14.8 g/dL (ref 13.0–17.0)
Lymphocytes Relative: 34 % (ref 12–46)
Lymphs Abs: 2.4 10*3/uL (ref 0.7–4.0)
MCH: 27.2 pg (ref 26.0–34.0)
MCHC: 33.6 g/dL (ref 30.0–36.0)
MCV: 80.9 fL (ref 78.0–100.0)
Monocytes Absolute: 0.4 10*3/uL (ref 0.1–1.0)
Monocytes Relative: 6 % (ref 3–12)
NEUTROS ABS: 4.2 10*3/uL (ref 1.7–7.7)
NEUTROS PCT: 59 % (ref 43–77)
Platelets: 225 10*3/uL (ref 150–400)
RBC: 5.44 MIL/uL (ref 4.22–5.81)
RDW: 14.6 % (ref 11.5–15.5)
WBC: 7 10*3/uL (ref 4.0–10.5)

## 2014-07-26 LAB — PROTIME-INR
INR: 0.96 (ref 0.00–1.49)
PROTHROMBIN TIME: 12.8 s (ref 11.6–15.2)

## 2014-07-26 MED ORDER — SODIUM CHLORIDE 0.9 % IV SOLN
INTRAVENOUS | Status: DC
  Administered 2014-07-26: 11:00:00 via INTRAVENOUS

## 2014-07-26 MED ORDER — CEFAZOLIN SODIUM-DEXTROSE 2-3 GM-% IV SOLR
2.0000 g | Freq: Once | INTRAVENOUS | Status: AC
  Administered 2014-07-26: 2 g via INTRAVENOUS

## 2014-07-26 MED ORDER — LIDOCAINE-EPINEPHRINE (PF) 2 %-1:200000 IJ SOLN
INTRAMUSCULAR | Status: AC
  Filled 2014-07-26: qty 20

## 2014-07-26 MED ORDER — FENTANYL CITRATE 0.05 MG/ML IJ SOLN
INTRAMUSCULAR | Status: AC
  Filled 2014-07-26: qty 4

## 2014-07-26 MED ORDER — HEPARIN SOD (PORK) LOCK FLUSH 100 UNIT/ML IV SOLN
INTRAVENOUS | Status: AC
  Filled 2014-07-26: qty 5

## 2014-07-26 MED ORDER — FENTANYL CITRATE 0.05 MG/ML IJ SOLN
INTRAMUSCULAR | Status: AC | PRN
  Administered 2014-07-26 (×2): 50 ug via INTRAVENOUS

## 2014-07-26 MED ORDER — HEPARIN SOD (PORK) LOCK FLUSH 100 UNIT/ML IV SOLN
500.0000 [IU] | Freq: Once | INTRAVENOUS | Status: AC
  Administered 2014-07-26: 500 [IU] via INTRAVENOUS

## 2014-07-26 MED ORDER — LORAZEPAM 2 MG/ML IJ SOLN
INTRAMUSCULAR | Status: AC | PRN
  Administered 2014-07-26: 1 mg via INTRAVENOUS

## 2014-07-26 MED ORDER — LIDOCAINE HCL 1 % IJ SOLN
INTRAMUSCULAR | Status: AC
  Filled 2014-07-26: qty 20

## 2014-07-26 MED ORDER — MIDAZOLAM HCL 2 MG/2ML IJ SOLN
INTRAMUSCULAR | Status: AC | PRN
  Administered 2014-07-26: 1 mg via INTRAVENOUS

## 2014-07-26 MED ORDER — CEFAZOLIN SODIUM-DEXTROSE 2-3 GM-% IV SOLR
INTRAVENOUS | Status: AC
  Filled 2014-07-26: qty 50

## 2014-07-26 MED ORDER — MIDAZOLAM HCL 2 MG/2ML IJ SOLN
INTRAMUSCULAR | Status: AC
  Filled 2014-07-26: qty 4

## 2014-07-26 NOTE — Procedures (Signed)
Successful RT IJ POWER PORT INSERTION NO COMP Stable Full report in PACS TIP SVC/RA

## 2014-07-26 NOTE — Discharge Instructions (Signed)
Implanted Port Home Guide °An implanted port is a type of central line that is placed under the skin. Central lines are used to provide IV access when treatment or nutrition needs to be given through a person's veins. Implanted ports are used for long-term IV access. An implanted port may be placed because:  °· You need IV medicine that would be irritating to the small veins in your hands or arms.   °· You need long-term IV medicines, such as antibiotics.   °· You need IV nutrition for a long period.   °· You need frequent blood draws for lab tests.   °· You need dialysis.   °Implanted ports are usually placed in the chest area, but they can also be placed in the upper arm, the abdomen, or the leg. An implanted port has two main parts:  °· Reservoir. The reservoir is round and will appear as a small, raised area under your skin. The reservoir is the part where a needle is inserted to give medicines or draw blood.   °· Catheter. The catheter is a thin, flexible tube that extends from the reservoir. The catheter is placed into a large vein. Medicine that is inserted into the reservoir goes into the catheter and then into the vein.   °HOW WILL I CARE FOR MY INCISION SITE? °Do not get the incision site wet. Bathe or shower as directed by your health care provider.  °HOW IS MY PORT ACCESSED? °Special steps must be taken to access the port:  °· Before the port is accessed, a numbing cream can be placed on the skin. This helps numb the skin over the port site.   °· Your health care provider uses a sterile technique to access the port. °¨ Your health care provider must put on a mask and sterile gloves. °¨ The skin over your port is cleaned carefully with an antiseptic and allowed to dry. °¨ The port is gently pinched between sterile gloves, and a needle is inserted into the port. °· Only "non-coring" port needles should be used to access the port. Once the port is accessed, a blood return should be checked. This helps  ensure that the port is in the vein and is not clogged.   °· If your port needs to remain accessed for a constant infusion, a clear (transparent) bandage will be placed over the needle site. The bandage and needle will need to be changed every week, or as directed by your health care provider.   °· Keep the bandage covering the needle clean and dry. Do not get it wet. Follow your health care provider's instructions on how to take a shower or bath while the port is accessed.   °· If your port does not need to stay accessed, no bandage is needed over the port.   °WHAT IS FLUSHING? °Flushing helps keep the port from getting clogged. Follow your health care provider's instructions on how and when to flush the port. Ports are usually flushed with saline solution or a medicine called heparin. The need for flushing will depend on how the port is used.  °· If the port is used for intermittent medicines or blood draws, the port will need to be flushed:   °¨ After medicines have been given.   °¨ After blood has been drawn.   °¨ As part of routine maintenance.   °· If a constant infusion is running, the port may not need to be flushed.   °HOW LONG WILL MY PORT STAY IMPLANTED? °The port can stay in for as long as your health care   provider thinks it is needed. When it is time for the port to come out, surgery will be done to remove it. The procedure is similar to the one performed when the port was put in.  °WHEN SHOULD I SEEK IMMEDIATE MEDICAL CARE? °When you have an implanted port, you should seek immediate medical care if:  °· You notice a bad smell coming from the incision site.   °· You have swelling, redness, or drainage at the incision site.   °· You have more swelling or pain at the port site or the surrounding area.   °· You have a fever that is not controlled with medicine. °Document Released: 12/15/2005 Document Revised: 10/05/2013 Document Reviewed: 08/22/2013 °[employer]® Patient Information ©2015 [employer]. This  information is not intended to replace advice given to you by your health care provider. Make sure you discuss any questions you have with your health care provider. ° °Conscious Sedation, Adult, Care After °Refer to this sheet in the next few weeks. These instructions provide you with information on caring for yourself after your procedure. Your health care provider may also give you more specific instructions. Your treatment has been planned according to current medical practices, but problems sometimes occur. Call your health care provider if you have any problems or questions after your procedure. °WHAT TO EXPECT AFTER THE PROCEDURE  °After your procedure: °· You may feel sleepy, clumsy, and have poor balance for several hours. °· Vomiting may occur if you eat too soon after the procedure. °HOME CARE INSTRUCTIONS °· Do not participate in any activities where you could become injured for at least 24 hours. Do not: °¨ Drive. °¨ Swim. °¨ Ride a bicycle. °¨ Operate heavy machinery. °¨ Cook. °¨ Use power tools. °¨ Climb ladders. °¨ Work from a high place. °· Do not make important decisions or sign legal documents until you are improved. °· If you vomit, drink water, juice, or soup when you can drink without vomiting. Make sure you have little or no nausea before eating solid foods. °· Only take over-the-counter or prescription medicines for pain, discomfort, or fever as directed by your health care provider. °· Make sure you and your family fully understand everything about the medicines given to you, including what side effects may occur. °· You should not drink alcohol, take sleeping pills, or take medicines that cause drowsiness for at least 24 hours. °· If you smoke, do not smoke without supervision. °· If you are feeling better, you may resume normal activities 24 hours after you were sedated. °· Keep all appointments with your health care provider. °SEEK MEDICAL CARE IF: °· Your skin is pale or bluish in  color. °· You continue to feel nauseous or vomit. °· Your pain is getting worse and is not helped by medicine. °· You have bleeding or swelling. °· You are still sleepy or feeling clumsy after 24 hours. °SEEK IMMEDIATE MEDICAL CARE IF: °· You develop a rash. °· You have difficulty breathing. °· You develop any type of allergic problem. °· You have a fever. °MAKE SURE YOU: °· Understand these instructions. °· Will watch your condition. °· Will get help right away if you are not doing well or get worse. °Document Released: 10/05/2013 Document Reviewed: 10/05/2013 °[employer]® Patient Information ©2015 [employer]. This information is not intended to replace advice given to you by your health care provider. Make sure you discuss any questions you have with your health care provider. ° ° °

## 2014-07-26 NOTE — H&P (Signed)
Chief Complaint: "I am here for a port." Referring Physician: Robynn Pane PA-C HPI: Brandon Hall is an 58 y.o. male with squamous cell carcinoma of the neck scheduled today for a port a catheter placement for chemotherapy starting tomorrow. He denies any chest pain, shortness of breath or palpitations. He denies any active signs of bleeding or excessive bruising. He denies any recent fever or chills. The patient denies any history of sleep apnea or chronic oxygen use. He has previously tolerated sedation without complications.   Past Medical History:  Past Medical History  Diagnosis Date  . Graves disease   . Hypothyroidism, postradioiodine therapy   . History of shingles     RASH  02-03-2014  PER PCP RESOLVING  . Prostate cancer     DX  SEVERAL  YRS   AGO  WITH RADIATION THERAPY  . Cataract, secondary obscuring vision     LEFT  . Blindness of right eye   . Hypertension     NO MEDS ,  MONITORED BY PCP  . Arthritis   . 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 Cancer     pharyngeal ca    Past Surgical History:  Past Surgical History  Procedure Laterality Date  . Colonoscopy  10/20/2011    Procedure: COLONOSCOPY;  Surgeon: Daneil Dolin, MD;  Location: [facility];  Service: Endoscopy;  Laterality: N/A;  1:00 pm  . Cryoablation N/A 04/07/2014    Procedure: SALVAGE CRYO ABLATION PROSTATE;  Surgeon: Lowella Bandy, MD;  Location: [facility];  Service: Urology;  Laterality: N/A;  . Laryngoscopy    . Lymph node biopsy      Family History:  Family History  Problem Relation Age of Onset  . Colon cancer Father   . Hypertension Mother     Social History:  reports that he quit smoking about 9 months ago. His smoking use included Cigarettes. He has a 40 pack-year smoking history. He has never used smokeless tobacco. He reports that he does not drink alcohol or use illicit drugs.  Allergies: No Known Allergies  Medications:   Medication List    ASK your  doctor about these medications       CISPLATIN IV  Inject into the vein every 28 (twenty-eight) days. To start 07/27/14     dextrose 5 % SOLN 1,000 mL with fluorouracil 5 GM/100ML SOLN  Inject into the vein every 28 (twenty-eight) days. To start on 07/27/14 and will infuse over 5 days     hydrochlorothiazide 12.5 MG tablet  Commonly known as:  HYDRODIURIL  Take 1 tablet (12.5 mg total) by mouth daily.     HYDROcodone-acetaminophen 5-325 MG per tablet  Commonly known as:  NORCO/VICODIN  Take 1 tablet by mouth every 6 (six) hours as needed for moderate pain.     levothyroxine 88 MCG tablet  Commonly known as:  SYNTHROID, LEVOTHROID  Take 88 mcg by mouth daily before breakfast.     lidocaine-prilocaine cream  Commonly known as:  EMLA  Apply a quarter size amount to port site 1 hour prior to chemo. Do not rub in. Cover with plastic wrap.     metoCLOPramide 5 MG tablet  Commonly known as:  REGLAN  Starting the day after chemo, take 1 tab four times a day x 48 hours. Then may take 1 tablet four times a day IF needed for nausea/vomiting.     prochlorperazine 10 MG tablet  Commonly known as:  COMPAZINE  Starting the day after chemo, take 1 tab four times a day x 48 hours. Then may take 1 tablet four times a day IF needed for nausea/vomiting.       Please HPI for pertinent positives, otherwise complete 10 system ROS negative.  Physical Exam: BP 150/84  Pulse 84  Temp(Src) 97.4 F (36.3 C) (Oral)  Resp 20  Ht 6' (1.829 m)  Wt 136 lb 4 oz (61.803 kg)  BMI 18.47 kg/m2  SpO2 98% Body mass index is 18.47 kg/(m^2).  General Appearance:  Alert, cooperative, no distress  Head:  Normocephalic, without obvious abnormality, atraumatic  Neck: Left neck mass  Lungs:   Diminished bilaterally, no w/r/r, respirations unlabored without use of accessory muscles.  Chest Wall:  No tenderness or deformity  Heart:  Regular rate and rhythm, S1, S2 normal, no murmur, rub or gallop.  Abdomen:    Soft, non-tender, non distended.  Extremities: Extremities normal, atraumatic, no cyanosis or edema  Neurologic: Normal affect, no gross deficits.   Results for orders placed during the hospital encounter of 07/26/14 (from the past 48 hour(s))  CBC WITH DIFFERENTIAL     Status: None   Collection Time    07/26/14 10:33 AM      Result Value Ref Range   WBC 7.0  4.0 - 10.5 K/uL   RBC 5.44  4.22 - 5.81 MIL/uL   Hemoglobin 14.8  13.0 - 17.0 g/dL   HCT 44.0  39.0 - 52.0 %   MCV 80.9  78.0 - 100.0 fL   MCH 27.2  26.0 - 34.0 pg   MCHC 33.6  30.0 - 36.0 g/dL   RDW 14.6  11.5 - 15.5 %   Platelets 225  150 - 400 K/uL   Neutrophils Relative % 59  43 - 77 %   Neutro Abs 4.2  1.7 - 7.7 K/uL   Lymphocytes Relative 34  12 - 46 %   Lymphs Abs 2.4  0.7 - 4.0 K/uL   Monocytes Relative 6  3 - 12 %   Monocytes Absolute 0.4  0.1 - 1.0 K/uL   Eosinophils Relative 1  0 - 5 %   Eosinophils Absolute 0.1  0.0 - 0.7 K/uL   Basophils Relative 0  0 - 1 %   Basophils Absolute 0.0  0.0 - 0.1 K/uL  PROTIME-INR     Status: None   Collection Time    07/26/14 10:33 AM      Result Value Ref Range   Prothrombin Time 12.8  11.6 - 15.2 seconds   INR 0.96  0.00 - 1.49   No results found.  Assessment/Plan Squamous cell carcinoma of the neck History of prostate cancer Right eye blindness HTN Scheduled today for port a catheter placement with moderate sedation Patient has been NPO, no blood thinners taken, afebrile, labs reviewed. Risks and Benefits discussed with the patient. All of the patient's questions were answered, patient is agreeable to proceed. Consent signed and in chart.   Tsosie Billing D PA-C 07/26/2014, 11:56 AM

## 2014-07-27 ENCOUNTER — Encounter (HOSPITAL_BASED_OUTPATIENT_CLINIC_OR_DEPARTMENT_OTHER): Payer: Medicare HMO

## 2014-07-27 ENCOUNTER — Encounter (HOSPITAL_BASED_OUTPATIENT_CLINIC_OR_DEPARTMENT_OTHER): Payer: Medicare HMO | Admitting: Oncology

## 2014-07-27 VITALS — BP 147/88 | HR 82 | Temp 97.9°F | Resp 16 | Wt 136.2 lb

## 2014-07-27 DIAGNOSIS — C801 Malignant (primary) neoplasm, unspecified: Secondary | ICD-10-CM

## 2014-07-27 DIAGNOSIS — C770 Secondary and unspecified malignant neoplasm of lymph nodes of head, face and neck: Secondary | ICD-10-CM

## 2014-07-27 DIAGNOSIS — Z5111 Encounter for antineoplastic chemotherapy: Secondary | ICD-10-CM

## 2014-07-27 MED ORDER — FOSAPREPITANT DIMEGLUMINE INJECTION 150 MG
Freq: Once | INTRAVENOUS | Status: AC
  Administered 2014-07-27: 10:00:00 via INTRAVENOUS
  Filled 2014-07-27: qty 5

## 2014-07-27 MED ORDER — PALONOSETRON HCL INJECTION 0.25 MG/5ML
0.2500 mg | Freq: Once | INTRAVENOUS | Status: AC
  Administered 2014-07-27: 0.25 mg via INTRAVENOUS
  Filled 2014-07-27: qty 5

## 2014-07-27 MED ORDER — HEPARIN SOD (PORK) LOCK FLUSH 100 UNIT/ML IV SOLN: 500.0000 [IU] | Freq: Once | INTRAVENOUS | Status: DC | PRN

## 2014-07-27 MED ORDER — SODIUM CHLORIDE 0.9 % IV SOLN
800.0000 mg/m2/d | INTRAVENOUS | Status: DC
  Administered 2014-07-27: 7100 mg via INTRAVENOUS
  Filled 2014-07-27: qty 142

## 2014-07-27 MED ORDER — DEXAMETHASONE SODIUM PHOSPHATE 20 MG/5ML IJ SOLN: 12.0000 mg | Freq: Once | INTRAMUSCULAR | Status: DC

## 2014-07-27 MED ORDER — SODIUM CHLORIDE 0.9 % IV SOLN
80.0000 mg/m2 | Freq: Once | INTRAVENOUS | Status: AC
  Administered 2014-07-27: 142 mg via INTRAVENOUS
  Filled 2014-07-27: qty 142

## 2014-07-27 MED ORDER — SODIUM CHLORIDE 0.9 % IJ SOLN
10.0000 mL | INTRAMUSCULAR | Status: DC | PRN
  Administered 2014-07-27: 10 mL

## 2014-07-27 MED ORDER — SODIUM CHLORIDE 0.9 % IV SOLN
Freq: Once | INTRAVENOUS | Status: AC
  Administered 2014-07-27: 09:00:00 via INTRAVENOUS

## 2014-07-27 MED ORDER — SODIUM CHLORIDE 0.9 % IV SOLN: 150.0000 mg | Freq: Once | INTRAVENOUS | Status: DC

## 2014-07-27 MED ORDER — POTASSIUM CHLORIDE 2 MEQ/ML IV SOLN
Freq: Once | INTRAVENOUS | Status: AC
  Administered 2014-07-27: 10:00:00 via INTRAVENOUS
  Filled 2014-07-27: qty 10

## 2014-07-27 NOTE — Progress Notes (Signed)
Brandon Hall, [place] Hall [id number]  Secondary and unspecified malignant neoplasm of lymph nodes of head, face, and neck  CURRENT THERAPY: To start cycle 1 of Cisplatin + 5FU (days 1-5) today  INTERVAL HISTORY: Brandon Hall 58 y.o. male returns for  regular  visit for followup of locally advanced squamous cell carcinoma.  Oncology History   Secondary and unspecified malignant neoplasm of lymph nodes of head, face, and neck, likely nasopharynx in origin   Primary site: Pharynx - Nasopharynx   Staging method: AJCC 7th Edition   Clinical: Stage II (T1, N1, M0)   Summary: Stage II (T1, N1, M0)       Secondary and unspecified malignant neoplasm of lymph nodes of head, face, and neck   05/05/2014 Imaging CT scan of the neck show enlarged left lymph node measure less than 6 cm.   05/25/2014 Pathology Results [phone] is positive for squamous cell carcinoma.   05/25/2014 Procedure Lymph node biopsy of the left neck came back squamous cell carcinoma.   06/20/2014 Imaging PET CT scan show no evidence of distant metastatic disease.   06/29/2014 Surgery Laryngoscopy and random biopsy of the nasopharynx was negative for malignancy.   07/27/2014 -  Chemotherapy Cisplatin + 5FU (days 1-5) every 28 days   I personally reviewed and went over laboratory results with the patient.  The results are noted within this dictation.  He denies any complaints today.  He denies any questions today.   Dr. Barnet Glasgow saw the patient as well for the first time today (seen by Dr. Bubba Hales last week at [facility]).  We broached the topic of a PEG tube, but the patient reports he is swallowing ok at this time.  Therefore, we will hold off on pursuing that at this time, but I suspect he will need one in the future, particularly if radiation is utilized as a treatment modality.  Oncologically, he denies any complaints and ROS questioning is negative.  Past Medical History  Diagnosis Date  . Graves  disease   . Hypothyroidism, postradioiodine therapy   . History of shingles     RASH  02-03-2014  PER PCP RESOLVING  . Prostate cancer     DX  SEVERAL  YRS   AGO  WITH RADIATION THERAPY  . Cataract, secondary obscuring vision     LEFT  . Blindness of right eye   . Hypertension     NO MEDS ,  MONITORED BY PCP  . Arthritis   . 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 Cancer     pharyngeal ca    has Hypertension; Graves disease; Blind right eye; Dyslipidemia; Abnormal transaminases; Hypothyroid; Shingles rash; Secondary and unspecified malignant neoplasm of lymph nodes of head, face, and neck; and History of prostate cancer on his problem list.     has No Known Allergies.  Brandon Hall does not currently have medications on file.  Past Surgical History  Procedure Laterality Date  . Colonoscopy  10/20/2011    Procedure: COLONOSCOPY;  Surgeon: Daneil Dolin, MD;  Location: [facility];  Service: Endoscopy;  Laterality: N/A;  1:00 pm  . Cryoablation N/A 04/07/2014    Procedure: SALVAGE CRYO ABLATION PROSTATE;  Surgeon: Lowella Bandy, MD;  Location: [facility]  Service: Urology;  Laterality: N/A;  . Laryngoscopy    . Lymph node biopsy      Denies any headaches, dizziness, double vision, fevers, chills, night sweats, nausea,  vomiting, diarrhea, constipation, chest pain, heart palpitations, shortness of breath, blood in stool, black tarry stool, urinary pain, urinary burning, urinary frequency, hematuria.   PHYSICAL EXAMINATION  ECOG PERFORMANCE STATUS: 1 - Symptomatic but completely ambulatory  There were no vitals filed for this visit.  GENERAL:alert, no distress, cachectic, comfortable, cooperative and smiling SKIN: skin color, texture, turgor are normal, no rashes or significant lesions HEAD: Normocephalic, No masses, lesions, tenderness or abnormalities EYES: normal, PERRLA, EOMI, Conjunctiva are pink and non-injected EARS: External ears  normal OROPHARYNX:mucous membranes are moist  NECK: Large left sided neck mass LYMPH:  not examined BREAST:not examined LUNGS: not examined HEART: not examined ABDOMEN:not examined BACK: Back symmetric, no curvature. EXTREMITIES:less then 2 second capillary refill, no cyanosis  NEURO: alert & oriented x 3 with fluent speech, no focal motor/sensory deficits   LABORATORY DATA: CBC    Component Value Date/Time   WBC 7.0 07/26/2014 1033   WBC 4.9 05/01/2014 1019   RBC 5.44 07/26/2014 1033   RBC 5.4 05/01/2014 1019   HGB 14.8 07/26/2014 1033   HGB 14.1 05/01/2014 1019   HCT 44.0 07/26/2014 1033   HCT 45.2 05/01/2014 1019   PLT 225 07/26/2014 1033   MCV 80.9 07/26/2014 1033   MCV 83.3 05/01/2014 1019   MCH 27.2 07/26/2014 1033   MCH 26.0* 05/01/2014 1019   MCHC 33.6 07/26/2014 1033   MCHC 31.2* 05/01/2014 1019   RDW 14.6 07/26/2014 1033   LYMPHSABS 2.4 07/26/2014 1033   MONOABS 0.4 07/26/2014 1033   EOSABS 0.1 07/26/2014 1033   BASOSABS 0.0 07/26/2014 1033      Chemistry      Component Value Date/Time   NA 139 07/17/2014 1635   NA 140 05/01/2014 0958   K 3.7 07/17/2014 1635   CL 98 07/17/2014 1635   CO2 29 07/17/2014 1635   BUN 16 07/17/2014 1635   BUN 12 05/01/2014 0958   CREATININE 0.89 07/19/2014 1527   CREATININE 0.89 07/17/2014 1635      Component Value Date/Time   CALCIUM 9.9 07/17/2014 1635   ALKPHOS 97 07/17/2014 1635   AST 22 07/17/2014 1635   ALT 7 07/17/2014 1635   BILITOT 0.3 07/17/2014 1635       RADIOGRAPHIC STUDIES:  Ir Fluoro Guide Cv Line Right  07/26/2014   CLINICAL DATA:  Lymphoma, access for chemotherapy  EXAM: RIGHT INTERNAL JUGULAR SINGLE LUMEN POWER PORT CATHETER INSERTION  Date:  7/29/20157/29/2015 1:38 pm  Radiologist:  M. Daryll Brod, MD  Guidance:  ULTRASOUND AND FLUOROSCOPIC  FLUOROSCOPY TIME:  42 SECONDS  MEDICATIONS AND MEDICAL HISTORY: 2 g Ancefadministered within 1 hour of the procedure.2 mg Versed, 100 mcg fentanyl  ANESTHESIA/SEDATION: 40 min  CONTRAST:  None.   COMPLICATIONS: No immediate  PROCEDURE: Informed consent was obtained from the patient following explanation of the procedure, risks, benefits and alternatives. The patient understands, agrees and consents for the procedure. All questions were addressed. A time out was performed.  Maximal barrier sterile technique utilized including caps, mask, sterile gowns, sterile gloves, large sterile drape, hand hygiene, and 2% chlorhexidine scrub.  Under sterile conditions and local anesthesia, right internal jugular micropuncture venous access was performed. Access was performed with ultrasound. Images were obtained for documentation. A guide wire was inserted followed by a transitional dilator. This allowed insertion of a guide wire and catheter into the IVC. Measurements were obtained from the SVC / RA junction back to the right IJ venotomy site. In the right infraclavicular chest, a subcutaneous  pocket was created over the second anterior rib. This was done under sterile conditions and local anesthesia. 1% lidocaine with epinephrine was utilized for this. A 2.5 cm incision was made in the skin. Blunt dissection was performed to create a subcutaneous pocket over the right pectoralis major muscle. The pocket was flushed with saline vigorously. There was adequate hemostasis. The port catheter was assembled and checked for leakage. The port catheter was secured in the pocket with two retention sutures. The tubing was tunneled subcutaneously to the right venotomy site and inserted into the SVC/RA junction through a valved peel-away sheath. Position was confirmed with fluoroscopy. Images were obtained for documentation. The patient tolerated the procedure well. No immediate complications. Incisions were closed in a two layer fashion with 4 - 0 Vicryl suture. Dermabond was applied to the skin. The port catheter was accessed, blood was aspirated followed by saline and heparin flushes. Needle was removed. A dry sterile dressing was  applied.  IMPRESSION: Ultrasound and fluoroscopically guided right internal jugular single lumen power port catheter insertion. Tip in the SVC/RA junction. Catheter ready for use.   Electronically Signed   By: Daryll Brod M.D.   On: 07/26/2014 13:48   Ir US Guide Vasc Access Right  07/26/2014   CLINICAL DATA:  Lymphoma, access for chemotherapy  EXAM: RIGHT INTERNAL JUGULAR SINGLE LUMEN POWER PORT CATHETER INSERTION  Date:  7/29/20157/29/2015 1:38 pm  Radiologist:  M. Daryll Brod, MD  Guidance:  ULTRASOUND AND FLUOROSCOPIC  FLUOROSCOPY TIME:  42 SECONDS  MEDICATIONS AND MEDICAL HISTORY: 2 g Ancefadministered within 1 hour of the procedure.2 mg Versed, 100 mcg fentanyl  ANESTHESIA/SEDATION: 40 min  CONTRAST:  None.  COMPLICATIONS: No immediate  PROCEDURE: Informed consent was obtained from the patient following explanation of the procedure, risks, benefits and alternatives. The patient understands, agrees and consents for the procedure. All questions were addressed. A time out was performed.  Maximal barrier sterile technique utilized including caps, mask, sterile gowns, sterile gloves, large sterile drape, hand hygiene, and 2% chlorhexidine scrub.  Under sterile conditions and local anesthesia, right internal jugular micropuncture venous access was performed. Access was performed with ultrasound. Images were obtained for documentation. A guide wire was inserted followed by a transitional dilator. This allowed insertion of a guide wire and catheter into the IVC. Measurements were obtained from the SVC / RA junction back to the right IJ venotomy site. In the right infraclavicular chest, a subcutaneous pocket was created over the second anterior rib. This was done under sterile conditions and local anesthesia. 1% lidocaine with epinephrine was utilized for this. A 2.5 cm incision was made in the skin. Blunt dissection was performed to create a subcutaneous pocket over the right pectoralis major muscle. The pocket  was flushed with saline vigorously. There was adequate hemostasis. The port catheter was assembled and checked for leakage. The port catheter was secured in the pocket with two retention sutures. The tubing was tunneled subcutaneously to the right venotomy site and inserted into the SVC/RA junction through a valved peel-away sheath. Position was confirmed with fluoroscopy. Images were obtained for documentation. The patient tolerated the procedure well. No immediate complications. Incisions were closed in a two layer fashion with 4 - 0 Vicryl suture. Dermabond was applied to the skin. The port catheter was accessed, blood was aspirated followed by saline and heparin flushes. Needle was removed. A dry sterile dressing was applied.  IMPRESSION: Ultrasound and fluoroscopically guided right internal jugular single lumen power port catheter insertion. Tip  in the SVC/RA junction. Catheter ready for use.   Electronically Signed   By: Daryll Brod M.D.   On: 07/26/2014 13:48      ASSESSMENT:  1. Locally advanced squamous cell carcinoma of head and neck, HPV N/A.  No apparent primary site.  Systemic chemotherapy consisting of Cisplatin and 5FU (days 1-5) every 28 days starting on 07/27/2014.  Patient Active Problem List   Diagnosis Date Noted  . Secondary and unspecified malignant neoplasm of lymph nodes of head, face, and neck 07/12/2014  . History of prostate cancer 07/12/2014  . Shingles rash 02/03/2014  . Abnormal transaminases 10/02/2013  . Hypothyroid 10/02/2013  . Blind right eye 09/30/2013  . Dyslipidemia 09/30/2013  . Hypertension   . Graves disease      PLAN:  1. I personally reviewed and went over laboratory results with the patient.  The results are noted within this dictation. 2. Pre-chemo labs: CBC diff, CMET, Magnesium 3. Cycle 1 of chemotherapy today 4. Broached the topic of PEG tube 5. Return in 1 week for follow-up and nadir check.   THERAPY PLAN:  The patient is embarking on  induction therapy and we will evaluate for response to therapy.    All questions were answered. The patient knows to call the clinic with any problems, questions or concerns. We can certainly see the patient much sooner if necessary.  Patient and plan discussed with Dr. Farrel Gobble and he is in agreement with the aforementioned.   Patient seen by Dr. Barnet Glasgow as well today  Robynn Pane 07/27/2014

## 2014-07-27 NOTE — Patient Instructions (Signed)
[facility] Discharge Instructions for Patients Receiving Chemotherapy  Today you received the following chemotherapy agents Cisplatin and 5FU pump for 5 days. You do not need to come tomorrow for lab work. Return to clinic as scheduled next Tuesday to have pump removed. Keep appointments as scheduled to return to see MD for follow-up. Report any issues/concerns as needed. You may call the clinic [phone] to discuss any problems you may be having. To help prevent nausea and vomiting after your treatment, we encourage you to take your nausea medication as instructed.  If you develop nausea and vomiting that is not controlled by your nausea medication, call the clinic. If it is after clinic hours your family physician or the after hours number for the clinic or go to the Emergency Department.   BELOW ARE SYMPTOMS THAT SHOULD BE REPORTED IMMEDIATELY:  *FEVER GREATER THAN 101.0 F  *CHILLS WITH OR WITHOUT FEVER  NAUSEA AND VOMITING THAT IS NOT CONTROLLED WITH YOUR NAUSEA MEDICATION  *UNUSUAL SHORTNESS OF BREATH  *UNUSUAL BRUISING OR BLEEDING  TENDERNESS IN MOUTH AND THROAT WITH OR WITHOUT PRESENCE OF ULCERS  *URINARY PROBLEMS  *BOWEL PROBLEMS  UNUSUAL RASH Items with * indicate a potential emergency and should be followed up as soon as possible.  One of the nurses will contact you 24 hours after your treatment. Please let the nurse know about any problems that you may have experienced. Feel free to call the clinic you have any questions or concerns. The clinic phone number is [phone].   I have been informed and understand all the instructions given to me. I know to contact the clinic, my physician, or go to the Emergency Department if any problems should occur. I do not have any questions at this time, but understand that I may call the clinic during office hours or the Patient Navigator at [phone] should I have any questions or need assistance in obtaining  follow up care.    __________________________________________  _____________  __________ Signature of Patient or Authorized Representative            Date                   Time    __________________________________________ Nurse's Signature

## 2014-07-28 ENCOUNTER — Other Ambulatory Visit (HOSPITAL_COMMUNITY): Payer: Medicare HMO

## 2014-07-28 ENCOUNTER — Encounter (HOSPITAL_COMMUNITY): Payer: Self-pay | Admitting: *Deleted

## 2014-07-28 NOTE — Progress Notes (Signed)
Spoke with patient's caregiver [place]. She reports patient done fine last pm. Reports he ate well and drank plenty of fluids. She reports he denied any nausea, voiced no complaints. Denies any problems with continuous infusion pump.

## 2014-08-01 ENCOUNTER — Encounter (HOSPITAL_COMMUNITY): Payer: Medicare HMO | Attending: Hematology and Oncology

## 2014-08-01 DIAGNOSIS — C770 Secondary and unspecified malignant neoplasm of lymph nodes of head, face and neck: Secondary | ICD-10-CM | POA: Diagnosis not present

## 2014-08-01 DIAGNOSIS — Z452 Encounter for adjustment and management of vascular access device: Secondary | ICD-10-CM | POA: Diagnosis not present

## 2014-08-01 DIAGNOSIS — C801 Malignant (primary) neoplasm, unspecified: Secondary | ICD-10-CM | POA: Diagnosis not present

## 2014-08-01 MED ORDER — HEPARIN SOD (PORK) LOCK FLUSH 100 UNIT/ML IV SOLN
INTRAVENOUS | Status: AC
  Filled 2014-08-01: qty 5

## 2014-08-01 MED ORDER — HEPARIN SOD (PORK) LOCK FLUSH 100 UNIT/ML IV SOLN
500.0000 [IU] | Freq: Once | INTRAVENOUS | Status: AC | PRN
  Administered 2014-08-01: 500 [IU]

## 2014-08-01 MED ORDER — SODIUM CHLORIDE 0.9 % IJ SOLN
10.0000 mL | INTRAMUSCULAR | Status: DC | PRN
  Administered 2014-08-01: 10 mL

## 2014-08-03 ENCOUNTER — Encounter (HOSPITAL_BASED_OUTPATIENT_CLINIC_OR_DEPARTMENT_OTHER): Payer: Medicare HMO

## 2014-08-03 ENCOUNTER — Encounter (HOSPITAL_COMMUNITY): Payer: Self-pay

## 2014-08-03 VITALS — BP 154/93 | HR 79 | Temp 97.5°F | Resp 18 | Wt 135.0 lb

## 2014-08-03 DIAGNOSIS — Z8546 Personal history of malignant neoplasm of prostate: Secondary | ICD-10-CM

## 2014-08-03 DIAGNOSIS — C770 Secondary and unspecified malignant neoplasm of lymph nodes of head, face and neck: Secondary | ICD-10-CM

## 2014-08-03 NOTE — Patient Instructions (Signed)
[facility] Discharge Instructions  RECOMMENDATIONS MADE BY THE CONSULTANT AND ANY TEST RESULTS WILL BE SENT TO YOUR REFERRING PHYSICIAN.  Return on August 19th as scheduled for your second cycle of chemotherapy and office visit with Robynn Pane, PA  Thank you for choosing [facility] to provide your oncology and hematology care.  To afford each patient quality time with our providers, please arrive at least 15 minutes before your scheduled appointment time.  With your help, our goal is to use those 15 minutes to complete the necessary work-up to ensure our physicians have the information they need to help with your evaluation and healthcare recommendations.    Effective January 1st, 2014, we ask that you re-schedule your appointment with our physicians should you arrive 10 or more minutes late for your appointment.  We strive to give you quality time with our providers, and arriving late affects you and other patients whose appointments are after yours.    Again, thank you for choosing [facility].  Our hope is that these requests will decrease the amount of time that you wait before being seen by our physicians.       _____________________________________________________________  Should you have questions after your visit to [facility], please contact our office at [phone] between the hours of 8:30 a.m. and 4:30 p.m.  Voicemails left after 4:30 p.m. will not be returned until the following business day.  For prescription refill requests, have your pharmacy contact our office with your prescription refill request.    _______________________________________________________________  We hope that we have given you very good care.  You may receive a patient satisfaction survey in the mail, please complete it and return it as soon as possible.  We value your  feedback!  _______________________________________________________________  Have you asked about our STAR program?  STAR stands for Survivorship Training and Rehabilitation, and this is a nationally recognized cancer care program that focuses on survivorship and rehabilitation.  Cancer and cancer treatments may cause problems, such as, pain, making you feel tired and keeping you from doing the things that you need or want to do. Cancer rehabilitation can help. Our goal is to reduce these troubling effects and help you have the best quality of life possible.  You may receive a survey from a nurse that asks questions about your current state of health.  Based on the survey results, all eligible patients will be referred to the [facility] program for an evaluation so we can better serve you!  A frequently asked questions sheet is available upon request.

## 2014-08-03 NOTE — Progress Notes (Signed)
[facility]  OFFICE PROGRESS NOTE  Ballwin, Dorinda Hill, MD [address] [place] [address]  DIAGNOSIS: Secondary and unspecified malignant neoplasm of lymph nodes of head, face, and neck - Plan: CBC with Differential, Comprehensive metabolic panel  History of prostate cancer  Chief Complaint  Patient presents with   Stage IV head and neck cancer    CURRENT THERAPY: Cisplatin /5-FU (days 1-5 ) started 07/27/2014.  INTERVAL HISTORY: Brandon Hall 58 y.o. 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 He tolerated cycle 1 extremely well with no episodes of nausea, vomiting, sore mouth, diarrhea, or peripheral paresthesias. He denies any dysphagia, or bleeding, epistaxis, lower extremity swelling or redness, abdominal distention, skin rash, headache, or seizures.  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   Oncology History    Secondary and unspecified malignant neoplasm of lymph nodes of head, face, and neck, likely nasopharynx in origin  Primary site: Pharynx - Nasopharynx  Staging method: AJCC 7th Edition  Clinical: Stage II (T1, N1, M0)   Summary: Stage II (T1, N1, M0)       Secondary and unspecified malignant neoplasm of lymph nodes of head, face, and neck    05/05/2014  Imaging  CT scan of the neck show enlarged left lymph node measure less than 6 cm.    05/25/2014  Pathology Results  [phone] is positive for squamous cell carcinoma.    05/25/2014  Procedure  Lymph node biopsy of the left neck came back squamous cell carcinoma.    06/20/2014  Imaging  PET CT scan show no evidence of distant metastatic disease.    06/29/2014  Surgery  Laryngoscopy and random biopsy of the nasopharynx was negative for malignancy.    07/27/2014 -  Chemotherapy  Cisplatin + 5FU (days 1-5) every 28 days      ALLERGIES:  has No Known Allergies.  MEDICATIONS: has a current medication list which includes the following prescription(s): cisplatin, dextrose 5 % SOLN 1,000 mL with fluorouracil 5 GM/100ML SOLN, hydrochlorothiazide, hydrocodone-acetaminophen, levothyroxine, lidocaine-prilocaine, metoclopramide, and prochlorperazine.  SURGICAL HISTORY:  Past Surgical History  Procedure Laterality Date   Colonoscopy  10/20/2011    Procedure: COLONOSCOPY;  Surgeon: Corbin Ade, MD;  [facility];  Service: Endoscopy;  Laterality: N/A;  1:00 pm   Cryoablation N/A 04/07/2014    Procedure: SALVAGE CRYO ABLATION PROSTATE;  Surgeon: Su Grand, MD;  Location: [facility];  Service: Urology;  Laterality: N/A;   Laryngoscopy     Lymph node biopsy  FAMILY HISTORY: family history includes Colon cancer in his father; Hypertension in his mother.  SOCIAL HISTORY:  reports that he quit smoking about 10 months ago. His smoking use included Cigarettes. He has a 40 pack-year smoking history. He has never used smokeless tobacco. He reports that he does not drink alcohol or use illicit drugs.  REVIEW OF SYSTEMS:  Other than that discussed above is noncontributory.  PHYSICAL EXAMINATION: ECOG PERFORMANCE STATUS: 1 - Symptomatic but  completely ambulatory  Blood pressure 154/93, pulse 79, temperature 97.5 F (36.4 C), temperature source Oral, resp. rate 18, weight 135 lb (61.236 kg).  GENERAL:alert, no distress and comfortable SKIN: skin color, texture, turgor are normal, no rashes or significant lesions EYES: PERLA; right corneal base to thigh. Left pupil reactive to light. SINUSES: No redness or tenderness over maxillary or ethmoid sinuses OROPHARYNX:no exudate, no erythema on lips, buccal mucosa, or tongue. NECK: supple, thyroid normal size, non-tender, without nodularity. No masses. Left neck mass 8 cm in diameter, soft with fixation to the underlying structures. CHEST: Increased AP diameter with no breast masses. LifePort in place. LYMPH:  no palpable lymphadenopathy in the cervical, axillary or inguinal LUNGS: clear to auscultation and percussion with normal breathing effort HEART: regular rate & rhythm and no murmurs. ABDOMEN:abdomen soft, non-tender and normal bowel sounds MUSCULOSKELETAL:no cyanosis of digits and no clubbing. Range of motion normal.  NEURO: alert & oriented x 3 with fluent speech, no focal motor/sensory deficits   LABORATORY DATA: Hospital Outpatient Visit on 07/26/2014  Component Date Value Ref Range Status   WBC 07/26/2014 7.0  4.0 - 10.5 K/uL Final   RBC 07/26/2014 5.44  4.22 - 5.81 MIL/uL Final   Hemoglobin 07/26/2014 14.8  13.0 - 17.0 g/dL Final   HCT 16/09/9603 44.0  39.0 - 52.0 % Final   MCV 07/26/2014 80.9  78.0 - 100.0 fL Final   MCH 07/26/2014 27.2  26.0 - 34.0 pg Final   MCHC 07/26/2014 33.6  30.0 - 36.0 g/dL Final   RDW 54/08/8118 14.6  11.5 - 15.5 % Final   Platelets 07/26/2014 225  150 - 400 K/uL Final   Neutrophils Relative % 07/26/2014 59  43 - 77 % Final   Neutro Abs 07/26/2014 4.2  1.7 - 7.7 K/uL Final   Lymphocytes Relative 07/26/2014 34  12 - 46 % Final   Lymphs Abs 07/26/2014 2.4  0.7 - 4.0 K/uL Final   Monocytes Relative 07/26/2014 6  3 - 12 % Final    Monocytes Absolute 07/26/2014 0.4  0.1 - 1.0 K/uL Final   Eosinophils Relative 07/26/2014 1  0 - 5 % Final   Eosinophils Absolute 07/26/2014 0.1  0.0 - 0.7 K/uL Final   Basophils Relative 07/26/2014 0  0 - 1 % Final   Basophils Absolute 07/26/2014 0.0  0.0 - 0.1 K/uL Final   Prothrombin Time 07/26/2014 12.8  11.6 - 15.2 seconds Final   INR 07/26/2014 0.96  0.00 - 1.49 Final  Office Visit on 07/17/2014  Component Date Value Ref Range Status   WBC 07/17/2014 5.6  4.0 - 10.5 K/uL Final   RBC 07/17/2014 5.51  4.22 - 5.81 MIL/uL Final   Hemoglobin 07/17/2014 14.4  13.0 - 17.0 g/dL Final   HCT 14/78/2956 45.0  39.0 - 52.0 % Final   MCV 07/17/2014 81.7  78.0 - 100.0 fL Final   MCH 07/17/2014 26.1  26.0 - 34.0 pg Final   MCHC 07/17/2014 32.0  30.0 - 36.0 g/dL Final  RDW 07/17/2014 14.9  11.5 - 15.5 % Final   Platelets 07/17/2014 214  150 - 400 K/uL Final   Neutrophils Relative % 07/17/2014 62  43 - 77 % Final   Neutro Abs 07/17/2014 3.5  1.7 - 7.7 K/uL Final   Lymphocytes Relative 07/17/2014 30  12 - 46 % Final   Lymphs Abs 07/17/2014 1.7  0.7 - 4.0 K/uL Final   Monocytes Relative 07/17/2014 8  3 - 12 % Final   Monocytes Absolute 07/17/2014 0.4  0.1 - 1.0 K/uL Final   Eosinophils Relative 07/17/2014 0  0 - 5 % Final   Eosinophils Absolute 07/17/2014 0.0  0.0 - 0.7 K/uL Final   Basophils Relative 07/17/2014 0  0 - 1 % Final   Basophils Absolute 07/17/2014 0.0  0.0 - 0.1 K/uL Final   Sodium 07/17/2014 139  137 - 147 mEq/L Final   Potassium 07/17/2014 3.7  3.7 - 5.3 mEq/L Final   Chloride 07/17/2014 98  96 - 112 mEq/L Final   CO2 07/17/2014 29  19 - 32 mEq/L Final   Glucose, Bld 07/17/2014 122* 70 - 99 mg/dL Final   BUN 72/53/6644 16  6 - 23 mg/dL Final   Creatinine, Ser 07/17/2014 0.89  0.50 - 1.35 mg/dL Final   Calcium 03/47/4259 9.9  8.4 - 10.5 mg/dL Final   Total Protein 56/38/7564 8.1  6.0 - 8.3 g/dL Final   Albumin 33/29/5188 3.8  3.5 - 5.2 g/dL  Final   AST 41/66/0630 22  0 - 37 U/L Final   ALT 07/17/2014 7  0 - 53 U/L Final   Alkaline Phosphatase 07/17/2014 97  39 - 117 U/L Final   Total Bilirubin 07/17/2014 0.3  0.3 - 1.2 mg/dL Final   GFR calc non Af Amer 07/17/2014 >90  >90 mL/min Final   GFR calc Af Amer 07/17/2014 >90  >90 mL/min Final   Comment: (NOTE)                          The eGFR has been calculated using the CKD EPI equation.                          This calculation has not been validated in all clinical situations.                          eGFR's persistently <90 mL/min signify possible Chronic Kidney                          Disease.   Anion gap 07/17/2014 12  5 - 15 Final   Uric Acid, Serum 07/17/2014 5.2  4.0 - 7.8 mg/dL Final   Magnesium 16/12/930 2.4  1.5 - 2.5 mg/dL Final   Urine Total Volume-CRCL 07/19/2014 1650   Final   Collection Interval-CRCL 07/19/2014 24   Final   Creatinine, Urine 07/19/2014 65.34   Final   Creatinine 07/19/2014 0.89  0.50 - 1.35 mg/dL Final   Creatinine, 35T Ur 07/19/2014 1650  800 - 2000 mg/day Final   Creatinine Clearance 07/19/2014 84  75 - 125 mL/min Final    PATHOLOGY:   for TRENIDAD, GRIZZEL ([id number]) Patient: Rito Ehrlich Collected: 05/25/2014 Client: [name] Accession: [id number] Received: 05/25/2014 Art Hoss DOB: [birth year] Age: 1 Gender: M Reported: 05/26/2014 [address] Patient Ph: [phone]  MRN #: [id number] [place], [place] [address] Visit #: [id number].[id number] Chart #: Phone:  Fax: CC: Osborn Coho, MD REPORT OF SURGICAL PATHOLOGY FINAL DIAGNOSIS Diagnosis Lymph node, needle/core biopsy, L Neck - SQUAMOUS CELL CARCINOMA. Microscopic Comment There is a small amount of lymphoid tissue present and therefore this likely represents metastatic squamous cell carcinoma. Called to Dr Annalee Genta on 05/26/2014. (JDP:ecj 05/26/2014) Jimmy Picket MD Pathologist, Electronic Signature (Case signed 05/26/2014) Specimen Gross  and Clinical Information Specimen(s) Obtained: Lymph node, needle/core biopsy, L Neck Specimen Clinical Information L neck lymph node (jmc) Gross The specimen is received in formalin and consists of multiple core and core fragments of tan white, focally hemorrhagic soft tissue ranging from 0.1 cm to 0.6 cm in length x 0.1 cm in diameter. The specimen is entirely submitted in one cassette. (KL:kh 05-25-14) Report signed out from the following location(s) Technical Component performed at [facility]. [address],[place] [address].CLIA:[id number],CAP:[id number]., Interpretation performed at [facility] [address] [place], [place], [place] [address]. CLIA #: [id number],    Urinalysis No results found for this basename: colorurine,  appearanceur,  labspec,  phurine,  glucoseu,  hgbur,  bilirubinur,  ketonesur,  proteinur,  urobilinogen,  nitrite,  leukocytesur    RADIOGRAPHIC STUDIES: Mr Lodema Pilot Contrast  July 23, 2014   CLINICAL DATA:  Prostate cancer. Squamous cell carcinoma nasopharynx. Staging  EXAM: MRI HEAD WITHOUT AND WITH CONTRAST  TECHNIQUE: Multiplanar, multiecho pulse sequences of the brain and surrounding structures were obtained without and with intravenous contrast.  CONTRAST:  12mL MULTIHANCE GADOBENATE DIMEGLUMINE 529 MG/ML IV SOLN  COMPARISON:  CT neck 05/05/2014  FINDINGS: Large necrotic lymph node mass in the left upper neck has enlarged since the prior study and now measures approximately 6 cm in diameter.  Right cerebellar lesion identified by CT is an area of chronic hemorrhage. There is a small central cystic area with surrounding hemosiderin and no significant enhancement or edema. This is compatible with chronic hemorrhage, likely from a cavernoma. No other areas of intracranial hemorrhage are identified.  Ventricle size is normal.  Negative for acute infarct.  Negative for metastatic disease. No enhancing metastatic deposits are  identified.  Paranasal sinuses are clear.  Prosthetic right eye  IMPRESSION: Right cerebellar cavernoma with chronic hemorrhage.  Negative for metastatic disease to the brain  Large necrotic metastatic lymph node mass in the left neck.   Electronically Signed   By: Marlan Palau M.D.   On: Jul 23, 2014 16:09   Ir Fluoro Guide Cv Line Right  07/26/2014   CLINICAL DATA:  Lymphoma, access for chemotherapy  EXAM: RIGHT INTERNAL JUGULAR SINGLE LUMEN POWER PORT CATHETER INSERTION  Date:  7/29/20157/29/2015 1:38 pm  Radiologist:  M. Ruel Favors, MD  Guidance:  ULTRASOUND AND FLUOROSCOPIC  FLUOROSCOPY TIME:  42 SECONDS  MEDICATIONS AND MEDICAL HISTORY: 2 g Ancefadministered within 1 hour of the procedure.2 mg Versed, 100 mcg fentanyl  ANESTHESIA/SEDATION: 40 min  CONTRAST:  None.  COMPLICATIONS: No immediate  PROCEDURE: Informed consent was obtained from the patient following explanation of the procedure, risks, benefits and alternatives. The patient understands, agrees and consents for the procedure. All questions were addressed. A time out was performed.  Maximal barrier sterile technique utilized including caps, mask, sterile gowns, sterile gloves, large sterile drape, hand hygiene, and 2% chlorhexidine scrub.  Under sterile conditions and local anesthesia, right internal jugular micropuncture venous access was performed. Access was performed with ultrasound. Images were obtained for documentation. A guide wire was inserted followed  by a transitional dilator. This allowed insertion of a guide wire and catheter into the IVC. Measurements were obtained from the SVC / RA junction back to the right IJ venotomy site. In the right infraclavicular chest, a subcutaneous pocket was created over the second anterior rib. This was done under sterile conditions and local anesthesia. 1% lidocaine with epinephrine was utilized for this. A 2.5 cm incision was made in the skin. Blunt dissection was performed to create a subcutaneous  pocket over the right pectoralis major muscle. The pocket was flushed with saline vigorously. There was adequate hemostasis. The port catheter was assembled and checked for leakage. The port catheter was secured in the pocket with two retention sutures. The tubing was tunneled subcutaneously to the right venotomy site and inserted into the SVC/RA junction through a valved peel-away sheath. Position was confirmed with fluoroscopy. Images were obtained for documentation. The patient tolerated the procedure well. No immediate complications. Incisions were closed in a two layer fashion with 4 - 0 Vicryl suture. Dermabond was applied to the skin. The port catheter was accessed, blood was aspirated followed by saline and heparin flushes. Needle was removed. A dry sterile dressing was applied.  IMPRESSION: Ultrasound and fluoroscopically guided right internal jugular single lumen power port catheter insertion. Tip in the SVC/RA junction. Catheter ready for use.   Electronically Signed   By: Ruel Favors M.D.   On: 07/26/2014 13:48   Ir US Guide Vasc Access Right  07/26/2014   CLINICAL DATA:  Lymphoma, access for chemotherapy  EXAM: RIGHT INTERNAL JUGULAR SINGLE LUMEN POWER PORT CATHETER INSERTION  Date:  7/29/20157/29/2015 1:38 pm  Radiologist:  M. Ruel Favors, MD  Guidance:  ULTRASOUND AND FLUOROSCOPIC  FLUOROSCOPY TIME:  42 SECONDS  MEDICATIONS AND MEDICAL HISTORY: 2 g Ancefadministered within 1 hour of the procedure.2 mg Versed, 100 mcg fentanyl  ANESTHESIA/SEDATION: 40 min  CONTRAST:  None.  COMPLICATIONS: No immediate  PROCEDURE: Informed consent was obtained from the patient following explanation of the procedure, risks, benefits and alternatives. The patient understands, agrees and consents for the procedure. All questions were addressed. A time out was performed.  Maximal barrier sterile technique utilized including caps, mask, sterile gowns, sterile gloves, large sterile drape, hand hygiene, and 2%  chlorhexidine scrub.  Under sterile conditions and local anesthesia, right internal jugular micropuncture venous access was performed. Access was performed with ultrasound. Images were obtained for documentation. A guide wire was inserted followed by a transitional dilator. This allowed insertion of a guide wire and catheter into the IVC. Measurements were obtained from the SVC / RA junction back to the right IJ venotomy site. In the right infraclavicular chest, a subcutaneous pocket was created over the second anterior rib. This was done under sterile conditions and local anesthesia. 1% lidocaine with epinephrine was utilized for this. A 2.5 cm incision was made in the skin. Blunt dissection was performed to create a subcutaneous pocket over the right pectoralis major muscle. The pocket was flushed with saline vigorously. There was adequate hemostasis. The port catheter was assembled and checked for leakage. The port catheter was secured in the pocket with two retention sutures. The tubing was tunneled subcutaneously to the right venotomy site and inserted into the SVC/RA junction through a valved peel-away sheath. Position was confirmed with fluoroscopy. Images were obtained for documentation. The patient tolerated the procedure well. No immediate complications. Incisions were closed in a two layer fashion with 4 - 0 Vicryl suture. Dermabond was applied to the skin.  The port catheter was accessed, blood was aspirated followed by saline and heparin flushes. Needle was removed. A dry sterile dressing was applied.  IMPRESSION: Ultrasound and fluoroscopically guided right internal jugular single lumen power port catheter insertion. Tip in the SVC/RA junction. Catheter ready for use.   Electronically Signed   By: Ruel Favors M.D.   On: 07/26/2014 13:48    ASSESSMENT:  #1. Stage IV squamous carcinoma of the head and neck, primary unknown, good tolerability of initial treatment with cisplatin 5-FU. #2. COPD. #3.  Hypothyroidism. #4. Right eye corneal opacity with blindness. #5. History of Graves' disease. #6. Hypertension, controlled.   PLAN:  #1. To deliver cycle 2 in 2 weeks instead of 3. #2. Patient was told to call should any new problems occur including peripheral paresthesias, or bleeding, or mucositis.   All questions were answered. The patient knows to call the clinic with any problems, questions or concerns. We can certainly see the patient much sooner if necessary.   I spent 25 minutes counseling the patient face to face. The total time spent in the appointment was 30 minutes.    Maurilio Lovely, MD 08/03/2014 7:36 PM  DISCLAIMER:  This note was dictated with voice recognition software.  Similar sounding words can inadvertently be transcribed inaccurately and may not be corrected upon review.

## 2014-08-04 ENCOUNTER — Telehealth: Payer: Self-pay | Admitting: *Deleted

## 2014-08-04 NOTE — Telephone Encounter (Signed)
Called pt to check on his well being since starting chemotherapy, spoke with family member Roslyn who said he was not available.  She reported that he tolerated first round of chemotherapy well, "he didn't get sick at all", "he's doing fine, not having any problems".  She reported that receiving tmts in [place] was working well for all concerned.  I asked her to let him know I called and will be checking in with him on a weekly basis.  She stated she would.  Gayleen Orem, RN, BSN, [facility] Oncology Navigator [phone]

## 2014-08-09 ENCOUNTER — Telehealth (HOSPITAL_COMMUNITY): Payer: Self-pay | Admitting: Hematology and Oncology

## 2014-08-09 NOTE — Telephone Encounter (Signed)
Called pcp for referral.

## 2014-08-10 ENCOUNTER — Telehealth: Payer: Self-pay | Admitting: *Deleted

## 2014-08-10 NOTE — Telephone Encounter (Signed)
Spoke with patient family member [place].  She reports that Brandon Hall is doing well, not experiencing any SEs from chemo, maintaining his baseline mobility and ADLs.  She reported understanding that his next chemo infusion is scheduled for next Wednesday.   Gayleen Orem, RN, BSN, [place] [facility] [place] [phone]

## 2014-08-15 ENCOUNTER — Telehealth (HOSPITAL_COMMUNITY): Payer: Self-pay | Admitting: Dietician

## 2014-08-15 NOTE — Telephone Encounter (Addendum)
Nutrition Follow-Up Note  58 year old male diagnosed with Left lateral neck mass/nasopharyngeal cancer. He is a patient of Dr. Isidore Moos.   Past medical history includes: Graves Disease, hypothyroidism, shingles, Prostate cancer, Blind right eye, HTN, Arthritis, tobacco.   Medications include synthroid, Norco/Vicodin, Hydrodiuril, Reglan, and Compazine. Pt started Cisplatin on 07/28/14.  Labs include Glucose of 122 on 07/17/14. All other labs WNL.   Height: 72 inches.  Weight: 135# (61.236 kg) on 08/03/14 UBW: 142 pounds on May 16, 2014.  BMI: 18.3.   Received request to follow-up with pt from [facility]. Noted pt experienced a 2.2% wt loss since last visit approximately 1 month ago.   Called and spoke with caregiver, Brayton Layman, via telephone at [phone]. She denies that pt has had any weight loss. She reports that his appetite has improved greatly since starting chemo. She denies that Mr. Caples has had any chewing or swallowing difficulties at this time and has not required mechanically altered foods presently. He is eating a "solid" 3 meals per day.    Noted that [facility] had discussed potential for PEG placement prior to pt starting chemotherapy on 07/27/14, however, this was deferred due to pt denying dysphagia.   Diet recall is as follows:  Breakfast: bacon, eggs, sausage, toast Lunch: soup and sandwich OR boiled eggs Dinner: fried chicken, mashed potatoes, green beans, and bread  Nutrition Diagnosis: Predicted suboptimal energy intake related to left neck mass as evidenced by patients with history of this condition for which research shows inadequate oral intake; improving  Intervention: Reviewed diet recall with caregiver. Discussed importance of continued good PO intake to prevent weight loss and maintain nutritional status. Encouraged continued intake of high protein foods and snacks. Caregiver reports she has no nutrition concerns or questions at this time. She is very pleased that Mr. Shirah is  eating well presently.   Monitoring, Evaluation, Goals: Patient will tolerate adequate calories and protein for weight maintenance; goal met  Next Visit: Patient will call with questions or concerns. Follow up appointment as needed.

## 2014-08-15 NOTE — Progress Notes (Signed)
Brandon Hall, [place] [address]  Secondary and unspecified malignant neoplasm of lymph nodes of head, face, and neck  CURRENT THERAPY: Cisplatin /5-FU (days 1-5 ) started 07/27/2014.  INTERVAL HISTORY: Brandon Hall 58 y.o. 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   05/25/2014 Pathology Results [phone] is positive for squamous cell carcinoma.   05/25/2014 Procedure Lymph node biopsy of the left neck came back squamous cell carcinoma.   06/20/2014 Imaging PET CT scan show no evidence of distant metastatic disease.   06/29/2014 Surgery Laryngoscopy and random biopsy of the nasopharynx was negative for malignancy.   07/27/2014 -  Chemotherapy Cisplatin + 5FU (days 1-5) every 28 days    I personally reviewed and went over laboratory results with the patient.  The results are noted within this dictation.  He tolerated his first cycle of chemotherapy very well without any complaints.  He denies any nausea or vomiting.  His bowels are moving appropriately.    He reports a response to therapy.  He notes that he can now move his head to the left some and he can lay in a bed without pain (which he could do neither prior to cycle 1).  On exam, the mass is smaller and better defined.    He is very pleased with the response and tolerability of cycle 1 and he hopes this continues.  Oncologically, he denies any complaints and  ROS questioning is negative.   Past Medical History  Diagnosis Date  . Graves disease   . Hypothyroidism, postradioiodine therapy   . History of shingles     RASH  02-03-2014  PER PCP RESOLVING  . Prostate cancer     DX  SEVERAL  YRS   AGO  WITH RADIATION THERAPY  . Cataract, secondary obscuring vision     LEFT  . Blindness of right eye   . Hypertension     NO MEDS ,  MONITORED BY PCP  . Arthritis   . 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 Cancer     pharyngeal ca    has Hypertension; Graves disease; Blind right eye; Dyslipidemia; Abnormal transaminases; Hypothyroid; Shingles rash; Secondary and unspecified malignant neoplasm of lymph nodes of head, face, and neck; and History of prostate cancer on his problem list.     has No Known Allergies.  Mr. Eichenberger does not currently have medications on file.  Past Surgical History  Procedure Laterality Date  . Colonoscopy  10/20/2011    Procedure: COLONOSCOPY;  Surgeon: Daneil Dolin, MD;  [facility];  Service: Endoscopy;  Laterality: N/A;  1:00 pm  . Cryoablation N/A 04/07/2014    Procedure: SALVAGE CRYO ABLATION PROSTATE;  Surgeon: Lowella Bandy, MD;  Location: [facility];  Service: Urology;  Laterality: N/A;  . Laryngoscopy    .  Lymph node biopsy      Denies any headaches, dizziness, double vision, fevers, chills, night sweats, nausea, vomiting, diarrhea, constipation, chest pain, heart palpitations, shortness of breath, blood in stool, black tarry stool, urinary pain, urinary burning, urinary frequency, hematuria.   PHYSICAL EXAMINATION  ECOG PERFORMANCE STATUS: 1 - Symptomatic but completely ambulatory  There were no vitals filed for this visit.  GENERAL:alert, no distress, well nourished, well developed, comfortable, cooperative and smiling SKIN: skin color, texture, turgor are normal, no rashes or significant lesions HEAD: Normocephalic, No masses, lesions, tenderness or  abnormalities EYES: PERRLA, right corneal opacity with blindness EARS: External ears normal OROPHARYNX:lips, buccal mucosa, and tongue normal and mucous membranes are moist  NECK: supple, trachea midline, left neck mass measuring 8 cm in diameter LYMPH:  no hepatosplenomegaly BREAST:not examined LUNGS: clear to auscultation  HEART: regular rate & rhythm, no murmurs and no gallops ABDOMEN:abdomen soft, non-tender, normal bowel sounds and no masses or organomegaly BACK: Back symmetric, no curvature., No CVA tenderness EXTREMITIES:less then 2 second capillary refill, no joint deformities, effusion, or inflammation, no skin discoloration, no cyanosis  NEURO: alert & oriented x 3 with fluent speech, no focal motor/sensory deficits, gait normal   LABORATORY DATA: CBC    Component Value Date/Time   WBC 3.2* 08/16/2014 0845   WBC 4.9 05/01/2014 1019   RBC 5.02 08/16/2014 0845   RBC 5.4 05/01/2014 1019   HGB 13.2 08/16/2014 0845   HGB 14.1 05/01/2014 1019   HCT 40.7 08/16/2014 0845   HCT 45.2 05/01/2014 1019   PLT 220 08/16/2014 0845   MCV 81.1 08/16/2014 0845   MCV 83.3 05/01/2014 1019   MCH 26.3 08/16/2014 0845   MCH 26.0* 05/01/2014 1019   MCHC 32.4 08/16/2014 0845   MCHC 31.2* 05/01/2014 1019   RDW 16.1* 08/16/2014 0845   LYMPHSABS 1.5 08/16/2014 0845   MONOABS 0.4 08/16/2014 0845   EOSABS 0.0 08/16/2014 0845   BASOSABS 0.0 08/16/2014 0845      Chemistry      Component Value Date/Time   NA 139 07/17/2014 1635   NA 140 05/01/2014 0958   K 3.7 07/17/2014 1635   CL 98 07/17/2014 1635   CO2 29 07/17/2014 1635   BUN 16 07/17/2014 1635   BUN 12 05/01/2014 0958   CREATININE 0.89 07/19/2014 1527   CREATININE 0.89 07/17/2014 1635      Component Value Date/Time   CALCIUM 9.9 07/17/2014 1635   ALKPHOS 97 07/17/2014 1635   AST 22 07/17/2014 1635   ALT 7 07/17/2014 1635   BILITOT 0.3 07/17/2014 1635        ASSESSMENT:  1. Locally advanced squamous cell carcinoma of head and neck, HPV N/A. No apparent primary  site. Systemic chemotherapy consisting of Cisplatin and 5FU (days 1-5) every 21 days starting on 07/27/2014. 2. COPD. 3. Hypothyroidism 4. Right eye corneal opacity with blindness. 5. History of Graves' disease 6. Hypertension, controlled.  Patient Active Problem List   Diagnosis Date Noted  . Secondary and unspecified malignant neoplasm of lymph nodes of head, face, and neck 07/12/2014  . History of prostate cancer 07/12/2014  . Shingles rash 02/03/2014  . Abnormal transaminases 10/02/2013  . Hypothyroid 10/02/2013  . Blind right eye 09/30/2013  . Dyslipidemia 09/30/2013  . Hypertension   . Graves disease      PLAN:  1. I personally reviewed and went over laboratory results with the patient.  The results are noted within this dictation. 2. Pre-chemo labs as planned  3. Cycle 2 of chemotherapy today 4. Return in 3 weeks for follow-up and start of cycle 3.  Scheduling of restaging scans will occur with next office visit.   THERAPY PLAN:  Mr. Padula will undergo 3 cycles of chemotherapy and then undergo restaging scans to evaluate response of therapy.  He is tolerating therapy well thus far and clinically is responding.  All questions were answered. The patient knows to call the clinic with any problems, questions or concerns. We can certainly see the patient much sooner if necessary.  Patient and plan discussed with Dr. Farrel Gobble and he is in agreement with the aforementioned.   KEFALAS,THOMAS 08/16/2014

## 2014-08-16 ENCOUNTER — Encounter (HOSPITAL_BASED_OUTPATIENT_CLINIC_OR_DEPARTMENT_OTHER): Payer: Medicare HMO | Admitting: Oncology

## 2014-08-16 ENCOUNTER — Encounter (HOSPITAL_COMMUNITY): Payer: Self-pay

## 2014-08-16 ENCOUNTER — Encounter (HOSPITAL_BASED_OUTPATIENT_CLINIC_OR_DEPARTMENT_OTHER): Payer: Medicare HMO

## 2014-08-16 VITALS — BP 139/75 | HR 79 | Temp 97.1°F | Resp 16 | Wt 133.7 lb

## 2014-08-16 DIAGNOSIS — C770 Secondary and unspecified malignant neoplasm of lymph nodes of head, face and neck: Secondary | ICD-10-CM

## 2014-08-16 DIAGNOSIS — C801 Malignant (primary) neoplasm, unspecified: Secondary | ICD-10-CM

## 2014-08-16 DIAGNOSIS — Z5111 Encounter for antineoplastic chemotherapy: Secondary | ICD-10-CM

## 2014-08-16 LAB — CBC WITH DIFFERENTIAL/PLATELET
BASOS ABS: 0 10*3/uL (ref 0.0–0.1)
Basophils Relative: 0 % (ref 0–1)
Eosinophils Absolute: 0 10*3/uL (ref 0.0–0.7)
Eosinophils Relative: 1 % (ref 0–5)
HCT: 40.7 % (ref 39.0–52.0)
Hemoglobin: 13.2 g/dL (ref 13.0–17.0)
Lymphocytes Relative: 48 % — ABNORMAL HIGH (ref 12–46)
Lymphs Abs: 1.5 10*3/uL (ref 0.7–4.0)
MCH: 26.3 pg (ref 26.0–34.0)
MCHC: 32.4 g/dL (ref 30.0–36.0)
MCV: 81.1 fL (ref 78.0–100.0)
Monocytes Absolute: 0.4 10*3/uL (ref 0.1–1.0)
Monocytes Relative: 13 % — ABNORMAL HIGH (ref 3–12)
NEUTROS ABS: 1.2 10*3/uL — AB (ref 1.7–7.7)
NEUTROS PCT: 38 % — AB (ref 43–77)
Platelets: 220 10*3/uL (ref 150–400)
RBC: 5.02 MIL/uL (ref 4.22–5.81)
RDW: 16.1 % — AB (ref 11.5–15.5)
WBC: 3.2 10*3/uL — ABNORMAL LOW (ref 4.0–10.5)

## 2014-08-16 LAB — COMPREHENSIVE METABOLIC PANEL
ALBUMIN: 3.7 g/dL (ref 3.5–5.2)
ALT: 8 U/L (ref 0–53)
AST: 17 U/L (ref 0–37)
Alkaline Phosphatase: 92 U/L (ref 39–117)
Anion gap: 11 (ref 5–15)
BILIRUBIN TOTAL: 0.3 mg/dL (ref 0.3–1.2)
BUN: 17 mg/dL (ref 6–23)
CHLORIDE: 99 meq/L (ref 96–112)
CO2: 28 mEq/L (ref 19–32)
Calcium: 9.7 mg/dL (ref 8.4–10.5)
Creatinine, Ser: 0.96 mg/dL (ref 0.50–1.35)
GFR calc Af Amer: >90 mL/min (ref >90)
GFR calc non Af Amer: 89 mL/min — ABNORMAL LOW (ref >90)
Glucose, Bld: 100 mg/dL — ABNORMAL HIGH (ref 70–99)
Potassium: 4.3 mEq/L (ref 3.7–5.3)
Sodium: 138 mEq/L (ref 137–147)
Total Protein: 7.5 g/dL (ref 6.0–8.3)

## 2014-08-16 LAB — MAGNESIUM: MAGNESIUM: 2.2 mg/dL (ref 1.5–2.5)

## 2014-08-16 MED ORDER — SODIUM CHLORIDE 0.9 % IJ SOLN
10.0000 mL | INTRAMUSCULAR | Status: DC | PRN
  Administered 2014-08-16: 10 mL

## 2014-08-16 MED ORDER — HEPARIN SOD (PORK) LOCK FLUSH 100 UNIT/ML IV SOLN: 500.0000 [IU] | Freq: Once | INTRAVENOUS | Status: DC | PRN

## 2014-08-16 MED ORDER — CISPLATIN CHEMO INJECTION 100MG/100ML
80.0000 mg/m2 | Freq: Once | INTRAVENOUS | Status: AC
  Administered 2014-08-16: 142 mg via INTRAVENOUS
  Filled 2014-08-16: qty 142

## 2014-08-16 MED ORDER — DEXAMETHASONE SODIUM PHOSPHATE 20 MG/5ML IJ SOLN: 12.0000 mg | Freq: Once | INTRAMUSCULAR | Status: DC

## 2014-08-16 MED ORDER — POTASSIUM CHLORIDE 2 MEQ/ML IV SOLN
Freq: Once | INTRAVENOUS | Status: DC
  Filled 2014-08-16: qty 10

## 2014-08-16 MED ORDER — SODIUM CHLORIDE 0.9 % IV SOLN
Freq: Once | INTRAVENOUS | Status: AC
  Administered 2014-08-16: 09:00:00 via INTRAVENOUS

## 2014-08-16 MED ORDER — SODIUM CHLORIDE 0.9 % IV SOLN: 150.0000 mg | Freq: Once | INTRAVENOUS | Status: DC

## 2014-08-16 MED ORDER — POTASSIUM CHLORIDE 2 MEQ/ML IV SOLN
INTRAVENOUS | Status: AC
  Administered 2014-08-16: 10:00:00 via INTRAVENOUS
  Filled 2014-08-16 (×2): qty 10

## 2014-08-16 MED ORDER — SODIUM CHLORIDE 0.9 % IV SOLN
800.0000 mg/m2/d | INTRAVENOUS | Status: DC
  Administered 2014-08-16: 7100 mg via INTRAVENOUS
  Filled 2014-08-16: qty 132

## 2014-08-16 MED ORDER — FOSAPREPITANT DIMEGLUMINE INJECTION 150 MG
Freq: Once | INTRAVENOUS | Status: AC
  Administered 2014-08-16: 09:00:00 via INTRAVENOUS
  Filled 2014-08-16: qty 5

## 2014-08-16 MED ORDER — PALONOSETRON HCL INJECTION 0.25 MG/5ML
0.2500 mg | Freq: Once | INTRAVENOUS | Status: AC
  Administered 2014-08-16: 0.25 mg via INTRAVENOUS
  Filled 2014-08-16: qty 5

## 2014-08-16 NOTE — Progress Notes (Signed)
Per Dr.Formanek and Meriel Flavors, patient only needs 1 liter fluid today at 500 ml per hour for 1 hour prior and 1 hour post Cisplatin. D/C home with family. Will return on Monday to d/c continuous infusion pump.

## 2014-08-16 NOTE — Patient Instructions (Addendum)
..  [facility] Discharge Instructions for Patients Receiving Chemotherapy  Today you received the following chemotherapy agents 5-FU and Cisplatin  To help prevent nausea and vomiting after your treatment, we encourage you to take your nausea medication as prescribed and as instructed (Compazine and Reglan starting the day after chemotherapy for 48 hours after treatment).   If you develop nausea and vomiting, or diarrhea that is not controlled by your medication, call the clinic.  The clinic phone number is [phone]. Office hours are Monday-Friday 8:30am-5:00pm.  BELOW ARE SYMPTOMS THAT SHOULD BE REPORTED IMMEDIATELY:  *FEVER GREATER THAN 101.0 F  *CHILLS WITH OR WITHOUT FEVER  NAUSEA AND VOMITING THAT IS NOT CONTROLLED WITH YOUR NAUSEA MEDICATION  *UNUSUAL SHORTNESS OF BREATH  *UNUSUAL BRUISING OR BLEEDING  TENDERNESS IN MOUTH AND THROAT WITH OR WITHOUT PRESENCE OF ULCERS  *URINARY PROBLEMS  *BOWEL PROBLEMS  UNUSUAL RASH Items with * indicate a potential emergency and should be followed up as soon as possible. If you have an emergency after office hours please contact your primary care physician or go to the nearest emergency department.  Please call the clinic during office hours if you have any questions or concerns.   You may also contact the Patient Navigator at [phone] should you have any questions or need assistance in obtaining follow up care. _____________________________________________________________________ Have you asked about our STAR program?    STAR stands for Survivorship Training and Rehabilitation, and this is a nationally recognized cancer care program that focuses on survivorship and rehabilitation.  Cancer and cancer treatments may cause problems, such as, pain, making you feel tired and keeping you from doing the things that you need or want to do. Cancer rehabilitation can help. Our goal is to reduce these troubling effects and  help you have the best quality of life possible.  You may receive a survey from a nurse that asks questions about your current state of health.  Based on the survey results, all eligible patients will be referred to the [facility] program for an evaluation so we can better serve you! A frequently asked questions sheet is available upon request.

## 2014-08-18 ENCOUNTER — Telehealth: Payer: Self-pay | Admitting: *Deleted

## 2014-08-18 NOTE — Telephone Encounter (Signed)
Spoke with [place], pts caregiver.  She reported Mr. Axtman tolerated this week's chemo very well, not experiencing any SEs, continues activities at baseline.  I encouraged her to call me if any needs arise; she verbalized understanding.  Gayleen Orem, RN, BSN, [place] at [place] [phone]

## 2014-08-21 ENCOUNTER — Encounter (HOSPITAL_BASED_OUTPATIENT_CLINIC_OR_DEPARTMENT_OTHER): Payer: Medicare HMO

## 2014-08-21 VITALS — BP 144/92 | HR 91 | Temp 97.9°F | Resp 16

## 2014-08-21 DIAGNOSIS — C770 Secondary and unspecified malignant neoplasm of lymph nodes of head, face and neck: Secondary | ICD-10-CM | POA: Diagnosis not present

## 2014-08-21 MED ORDER — SODIUM CHLORIDE 0.9 % IJ SOLN
10.0000 mL | INTRAMUSCULAR | Status: DC | PRN
  Administered 2014-08-21: 10 mL

## 2014-08-21 MED ORDER — HEPARIN SOD (PORK) LOCK FLUSH 100 UNIT/ML IV SOLN
500.0000 [IU] | Freq: Once | INTRAVENOUS | Status: AC | PRN
  Administered 2014-08-21: 500 [IU]
  Filled 2014-08-21: qty 5

## 2014-08-21 NOTE — Progress Notes (Signed)
D/C continuous infusion pump. Flushed port per protocol, removed port needle. Patient denies any complaints at present, has tolerated chemotherapy well.

## 2014-08-22 ENCOUNTER — Telehealth (HOSPITAL_COMMUNITY): Payer: Self-pay | Admitting: Dietician

## 2014-08-22 NOTE — Telephone Encounter (Signed)
Called caregiver, Brayton Layman, at [phone] for follow-up on Mr. Selley. Spoke with [place] who reports that [place] is not available. [place] reports that she is not involved in Mr. Kent's care and Brayton Layman would be the best person to receive information for assessment.   Chart reviewed.   Noted most recent wt of 133# taken on 08/16/14 reflects a 2.2% wt loss since starting chemotherapy on 07/28/14. He remains on Cisplatin and is tolerating well per oncology notes.   Previous nutrition follow-up indicates that pt had been eating well and had been continuing on a regular diet. Caregiver did not have any questions or concerns at that time.   Will continue to follow.  Suleyma Wafer A. Jimmye Norman, RD, LDN Pager: [phone]

## 2014-08-24 ENCOUNTER — Inpatient Hospital Stay (HOSPITAL_COMMUNITY): Payer: Medicare HMO

## 2014-08-24 ENCOUNTER — Ambulatory Visit (HOSPITAL_COMMUNITY): Payer: Medicare HMO

## 2014-08-29 ENCOUNTER — Encounter (HOSPITAL_COMMUNITY): Payer: Medicare HMO

## 2014-09-01 ENCOUNTER — Telehealth: Payer: Self-pay | Admitting: *Deleted

## 2014-09-01 NOTE — Telephone Encounter (Signed)
Spoke with Brayton Layman, Mr. Barbier's family Midwife.  She reported he continues to do well, is managing his ADLs, in particular eating much better now b/c neck swelling is much reduced.   She confirmed understanding of his 0945 chemo appt and 1000 office visit scheduled for next Wednesday morning, 09/06/14.  Gayleen Orem, RN, BSN, [place] at [place] [phone]

## 2014-09-04 NOTE — Progress Notes (Signed)
Brandon Hall, [place] [address]  Secondary and unspecified malignant neoplasm of lymph nodes of head, face, and neck - Plan: CT Abdomen Pelvis W Contrast, CT Chest W Contrast, CT Soft Tissue Neck W Contrast  Pulmonary nodule - Plan: CT Chest W Contrast  Pelvic lymphadenopathy - Plan: CT Soft Tissue Neck W Contrast  CURRENT THERAPY: Cisplatin /5-FU (days 1-5 ) started 07/27/2014.  INTERVAL HISTORY: Brandon Hall 58 y.o. 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   05/25/2014 Pathology Results [phone] is positive for squamous cell carcinoma.   05/25/2014 Procedure Lymph node biopsy of the left neck came back squamous cell carcinoma.   06/20/2014 Imaging PET CT scan show no evidence of distant metastatic disease.   06/29/2014 Surgery Laryngoscopy and random biopsy of the nasopharynx was negative for malignancy.   07/27/2014 -  Chemotherapy Cisplatin + 5FU (days 1-5) every 28 days   I personally reviewed and went over laboratory results with the patient.  The results are noted within this dictation.   His BP is noted to be 181/97 today in the clinic and he admits that he did not take his BP medications this AM.  He is encouraged to do so when he returns home.  He is amazed with the amount of tumor shrinkage. He is able to have full ROM of neck now.  His pain is much improved.  He is very pleased with the response to  therapy thus far.  He notes 2 episodes of epistaxis.  I recommended NS spray PRN to maintain moist mucous membranes  Past Medical History  Diagnosis Date  . Graves disease   . Hypothyroidism, postradioiodine therapy   . History of shingles     RASH  02-03-2014  PER PCP RESOLVING  . Prostate cancer     DX  SEVERAL  YRS   AGO  WITH RADIATION THERAPY  . Cataract, secondary obscuring vision     LEFT  . Blindness of right eye   . Hypertension     NO MEDS ,  MONITORED BY PCP  . Arthritis   . 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 Cancer     pharyngeal ca    has Hypertension; Graves disease; Blind right eye; Dyslipidemia; Abnormal transaminases; Hypothyroid; Shingles rash; Secondary and unspecified malignant neoplasm of lymph nodes of head, face, and neck; and History of prostate cancer on his problem list.     has No Known Allergies.  Brandon Hall does not currently have medications on file.  Past Surgical History  Procedure Laterality Date  . Colonoscopy  10/20/2011    Procedure: COLONOSCOPY;  Surgeon: Daneil Dolin, MD;  [facility];  Service: Endoscopy;  Laterality: N/A;  1:00 pm  . Cryoablation N/A 04/07/2014    Procedure: SALVAGE CRYO ABLATION PROSTATE;  Surgeon: Lowella Bandy,  MD;  Location: [place];  Service: Urology;  Laterality: N/A;  . Laryngoscopy    . Lymph node biopsy      Denies any headaches, dizziness, double vision, fevers, chills, night sweats, nausea, vomiting, diarrhea, constipation, chest pain, heart palpitations, shortness of breath, blood in stool, black tarry stool, urinary pain, urinary burning, urinary frequency, hematuria.   PHYSICAL EXAMINATION  ECOG PERFORMANCE STATUS: 1 - Symptomatic but completely ambulatory  There were no vitals filed for this visit.  GENERAL:alert, no distress, well nourished, well developed, cachectic, comfortable, cooperative and smiling SKIN: skin color, texture, turgor are normal, no  rashes or significant lesions HEAD: Normocephalic, No masses, lesions, tenderness or abnormalities EYES: normal, EOMI, Conjunctiva are pink and non-injected, opaque right eye EARS: External ears normal OROPHARYNX:mucous membranes are moist  NECK: supple, trachea midline, left neck mass measures 4 cm by 5 cm clinically.  It is much softer.   BREAST:not examined LUNGS: clear to auscultation  HEART: regular rate & rhythm, no murmurs and no gallops ABDOMEN:abdomen soft, non-tender, normal bowel sounds and no masses or organomegaly BACK: Back symmetric, no curvature. EXTREMITIES:less then 2 second capillary refill, no joint deformities, effusion, or inflammation, no edema, no skin discoloration, no clubbing, no cyanosis  NEURO: alert & oriented x 3 with fluent speech, no focal motor/sensory deficits, gait normal   LABORATORY DATA: CBC    Component Value Date/Time   WBC 3.0* 09/06/2014 0931   WBC 4.9 05/01/2014 1019   RBC 4.62 09/06/2014 0931   RBC 5.4 05/01/2014 1019   HGB 12.7* 09/06/2014 0931   HGB 14.1 05/01/2014 1019   HCT 38.7* 09/06/2014 0931   HCT 45.2 05/01/2014 1019   PLT 162 09/06/2014 0931   MCV 83.8 09/06/2014 0931   MCV 83.3 05/01/2014 1019   MCH 27.5 09/06/2014 0931   MCH 26.0* 05/01/2014 1019   MCHC 32.8 09/06/2014 0931   MCHC 31.2* 05/01/2014 1019   RDW 19.7* 09/06/2014 0931   LYMPHSABS 1.8 09/06/2014 0931   MONOABS 0.3 09/06/2014 0931   EOSABS 0.1 09/06/2014 0931   BASOSABS 0.0 09/06/2014 0931      Chemistry      Component Value Date/Time   NA 138 09/06/2014 0931   NA 140 05/01/2014 0958   K 3.9 09/06/2014 0931   CL 101 09/06/2014 0931   CO2 25 09/06/2014 0931   BUN 18 09/06/2014 0931   BUN 12 05/01/2014 0958   CREATININE 0.73 09/06/2014 0931   CREATININE 0.89 07/19/2014 1527      Component Value Date/Time   CALCIUM 9.3 09/06/2014 0931   ALKPHOS 72 09/06/2014 0931   AST 17 09/06/2014 0931   ALT 8 09/06/2014 0931   BILITOT 0.4 09/06/2014 0931        ASSESSMENT:  1. Locally advanced squamous cell carcinoma  of head and neck, HPV N/A. No apparent primary site. Systemic chemotherapy consisting of Cisplatin and 5FU (days 1-5) every 21 days starting on 07/27/2014.  2. COPD.  3. Hypothyroidism  4. Right eye corneal opacity with blindness.  5. History of Graves' disease  6. Hypertension, controlled. 7. Epistaxis x 2  Patient Active Problem List   Diagnosis Date Noted  . Secondary and unspecified malignant neoplasm of lymph nodes of head, face, and neck 07/12/2014  . History of prostate cancer 07/12/2014  . Shingles rash 02/03/2014  . Abnormal transaminases 10/02/2013  . Hypothyroid 10/02/2013  . Blind right eye 09/30/2013  . Dyslipidemia 09/30/2013  . Hypertension   . Graves  disease      PLAN:  1. I personally reviewed and went over laboratory results with the patient. The results are noted within this dictation.  2. Pre-chemo labs as planned  3. Cycle 3 of chemotherapy today 4. CT soft tissue of neck and CAP with contrast within the next 2 weeks for restaging 5. Will treat today as planned (ANC 0.8) and add Neulasta on the day of pump removal.  He is agreeable to this. 6. Treatment plan adjusted accordingly 7. Recommend NS spray to nares PRN 8. Return in 3 weeks for follow-up   THERAPY PLAN:  Mr. Mcglinchey with undergo cycle 3 of chemotherapy as planned followed by restaging scans.  Pre-chemotherapy PET scan demonstrated some chest and pelvic abnormalities (likely benign), therefore, we will follow-up on these items as well as response to therapy of neck mass. We plan on administering 4 cycles of chemotherapy and then we will send him to [place] for radiation therapy.  All questions were answered. The patient knows to call the clinic with any problems, questions or concerns. We can certainly see the patient much sooner if necessary.  Patient and plan discussed with Dr. Farrel Gobble and he is in agreement with the aforementioned.   KEFALAS,THOMAS 09/06/2014

## 2014-09-05 ENCOUNTER — Telehealth (HOSPITAL_COMMUNITY): Payer: Self-pay | Admitting: Dietician

## 2014-09-05 NOTE — Telephone Encounter (Signed)
Brief Nutrition Follow-up Note  Called [name] caregiver, Brandon Hall, to follow-up on Brandon Hall's nutritional status at 70. Brandon Hall was not available at time of call.   Chart reviewed. Brandon Hall has an office visit and chemo scheduled for tomorrow, 09/06/14. Per RN navigator, pt continues to tolerate chemotherapy well and continues to eat well. Intake has improved in light of his neck swelling being reduced.   No new weight since last follow-up.  Will continue to follow.   Derrell Milanes A. Jimmye Norman, RD, LDN Pager: [phone]

## 2014-09-06 ENCOUNTER — Encounter (HOSPITAL_BASED_OUTPATIENT_CLINIC_OR_DEPARTMENT_OTHER): Payer: Medicare HMO | Admitting: Oncology

## 2014-09-06 ENCOUNTER — Encounter (HOSPITAL_COMMUNITY): Payer: Self-pay | Admitting: Oncology

## 2014-09-06 ENCOUNTER — Encounter: Payer: Self-pay | Admitting: *Deleted

## 2014-09-06 ENCOUNTER — Encounter (HOSPITAL_COMMUNITY): Payer: Medicare HMO | Attending: Hematology and Oncology

## 2014-09-06 VITALS — BP 173/88 | HR 73 | Temp 97.5°F | Resp 16 | Wt 137.5 lb

## 2014-09-06 DIAGNOSIS — C760 Malignant neoplasm of head, face and neck: Secondary | ICD-10-CM | POA: Diagnosis present

## 2014-09-06 DIAGNOSIS — R911 Solitary pulmonary nodule: Secondary | ICD-10-CM | POA: Diagnosis present

## 2014-09-06 DIAGNOSIS — C770 Secondary and unspecified malignant neoplasm of lymph nodes of head, face and neck: Secondary | ICD-10-CM

## 2014-09-06 DIAGNOSIS — R590 Localized enlarged lymph nodes: Secondary | ICD-10-CM

## 2014-09-06 DIAGNOSIS — C103 Malignant neoplasm of posterior wall of oropharynx: Secondary | ICD-10-CM | POA: Diagnosis present

## 2014-09-06 DIAGNOSIS — C801 Malignant (primary) neoplasm, unspecified: Secondary | ICD-10-CM

## 2014-09-06 DIAGNOSIS — Z8546 Personal history of malignant neoplasm of prostate: Secondary | ICD-10-CM | POA: Insufficient documentation

## 2014-09-06 DIAGNOSIS — R599 Enlarged lymph nodes, unspecified: Secondary | ICD-10-CM | POA: Insufficient documentation

## 2014-09-06 DIAGNOSIS — Z5111 Encounter for antineoplastic chemotherapy: Secondary | ICD-10-CM

## 2014-09-06 LAB — COMPREHENSIVE METABOLIC PANEL
ALK PHOS: 72 U/L (ref 39–117)
ALT: 8 U/L (ref 0–53)
ANION GAP: 12 (ref 5–15)
AST: 17 U/L (ref 0–37)
Albumin: 3.9 g/dL (ref 3.5–5.2)
BILIRUBIN TOTAL: 0.4 mg/dL (ref 0.3–1.2)
BUN: 18 mg/dL (ref 6–23)
CHLORIDE: 101 meq/L (ref 96–112)
CO2: 25 meq/L (ref 19–32)
Calcium: 9.3 mg/dL (ref 8.4–10.5)
Creatinine, Ser: 0.73 mg/dL (ref 0.50–1.35)
GLUCOSE: 98 mg/dL (ref 70–99)
POTASSIUM: 3.9 meq/L (ref 3.7–5.3)
Sodium: 138 mEq/L (ref 137–147)
TOTAL PROTEIN: 7.2 g/dL (ref 6.0–8.3)

## 2014-09-06 LAB — CBC WITH DIFFERENTIAL/PLATELET
Basophils Absolute: 0 10*3/uL (ref 0.0–0.1)
Basophils Relative: 0 % (ref 0–1)
EOS ABS: 0.1 10*3/uL (ref 0.0–0.7)
Eosinophils Relative: 4 % (ref 0–5)
HCT: 38.7 % — ABNORMAL LOW (ref 39.0–52.0)
HEMOGLOBIN: 12.7 g/dL — AB (ref 13.0–17.0)
LYMPHS ABS: 1.8 10*3/uL (ref 0.7–4.0)
LYMPHS PCT: 60 % — AB (ref 12–46)
MCH: 27.5 pg (ref 26.0–34.0)
MCHC: 32.8 g/dL (ref 30.0–36.0)
MCV: 83.8 fL (ref 78.0–100.0)
MONOS PCT: 10 % (ref 3–12)
Monocytes Absolute: 0.3 10*3/uL (ref 0.1–1.0)
NEUTROS PCT: 26 % — AB (ref 43–77)
Neutro Abs: 0.8 10*3/uL — ABNORMAL LOW (ref 1.7–7.7)
Platelets: 162 10*3/uL (ref 150–400)
RBC: 4.62 MIL/uL (ref 4.22–5.81)
RDW: 19.7 % — ABNORMAL HIGH (ref 11.5–15.5)
WBC: 3 10*3/uL — AB (ref 4.0–10.5)

## 2014-09-06 LAB — MAGNESIUM: Magnesium: 2.1 mg/dL (ref 1.5–2.5)

## 2014-09-06 MED ORDER — SODIUM CHLORIDE 0.9 % IV SOLN
80.0000 mg/m2 | Freq: Once | INTRAVENOUS | Status: AC
  Administered 2014-09-06: 142 mg via INTRAVENOUS
  Filled 2014-09-06: qty 142

## 2014-09-06 MED ORDER — SODIUM CHLORIDE 0.9 % IV SOLN
800.0000 mg/m2/d | INTRAVENOUS | Status: DC
  Administered 2014-09-06: 7100 mg via INTRAVENOUS
  Filled 2014-09-06: qty 142

## 2014-09-06 MED ORDER — SODIUM CHLORIDE 0.9 % IV SOLN
Freq: Once | INTRAVENOUS | Status: AC
  Administered 2014-09-06: 10:00:00 via INTRAVENOUS

## 2014-09-06 MED ORDER — PALONOSETRON HCL INJECTION 0.25 MG/5ML
0.2500 mg | Freq: Once | INTRAVENOUS | Status: AC
  Administered 2014-09-06: 0.25 mg via INTRAVENOUS
  Filled 2014-09-06: qty 5

## 2014-09-06 MED ORDER — SODIUM CHLORIDE 0.9 % IV SOLN
Freq: Once | INTRAVENOUS | Status: AC
  Administered 2014-09-06: 12:00:00 via INTRAVENOUS
  Filled 2014-09-06: qty 5

## 2014-09-06 MED ORDER — SODIUM CHLORIDE 0.9 % IJ SOLN
10.0000 mL | INTRAMUSCULAR | Status: DC | PRN
  Administered 2014-09-06: 10 mL

## 2014-09-06 MED ORDER — POTASSIUM CHLORIDE 2 MEQ/ML IV SOLN
INTRAVENOUS | Status: AC
  Administered 2014-09-06: 10:00:00 via INTRAVENOUS
  Filled 2014-09-06 (×2): qty 10

## 2014-09-06 NOTE — Patient Instructions (Addendum)
[facility] Discharge Instructions for Patients Receiving Chemotherapy  Today you received the following chemotherapy agents:  Cisplatin and fluorouracil (5FU).  To help prevent nausea and vomiting after your treatment, we encourage you to take your nausea medications, metoclopramide (Reglan) and prochlorperazine (Compazine). Start taking these medications today per the instructions on the bottle, and take them for the next two days.  After that use per the instructions as needed.   Use a saline nasal spray like Ocean Spray to prevent nosebleeds.    If you develop nausea and vomiting, or diarrhea that is not controlled by your medication, call the clinic.  The clinic phone number is [phone]. Office hours are Monday-Friday 8:30am-5:00pm.  BELOW ARE SYMPTOMS THAT SHOULD BE REPORTED IMMEDIATELY:  *FEVER GREATER THAN 101.0 F  *CHILLS WITH OR WITHOUT FEVER  NAUSEA AND VOMITING THAT IS NOT CONTROLLED WITH YOUR NAUSEA MEDICATION  *UNUSUAL SHORTNESS OF BREATH  *UNUSUAL BRUISING OR BLEEDING  TENDERNESS IN MOUTH AND THROAT WITH OR WITHOUT PRESENCE OF ULCERS  *URINARY PROBLEMS  *BOWEL PROBLEMS  UNUSUAL RASH Items with * indicate a potential emergency and should be followed up as soon as possible. If you have an emergency after office hours please contact your primary care physician or go to the nearest emergency department.  Please call the clinic during office hours if you have any questions or concerns.   You may also contact the Patient Navigator at [phone] should you have any questions or need assistance in obtaining follow up care. _____________________________________________________________________ Have you asked about our STAR program?    STAR stands for Survivorship Training and Rehabilitation, and this is a nationally recognized cancer care program that focuses on survivorship and rehabilitation.  Cancer and cancer treatments may cause problems,  such as, pain, making you feel tired and keeping you from doing the things that you need or want to do. Cancer rehabilitation can help. Our goal is to reduce these troubling effects and help you have the best quality of life possible.  You may receive a survey from a nurse that asks questions about your current state of health.  Based on the survey results, all eligible patients will be referred to the [facility] program for an evaluation so we can better serve you! A frequently asked questions sheet is available upon request.

## 2014-09-06 NOTE — Progress Notes (Signed)
[facility] Clinical Social Work  Clinical Social Work met with pt, caretaker and his wife for assessment of psychosocial needs.  Clinical Social Worker has met pt and family in the past at head and neck clinic. CSW provided pt and family with CSW info sheet and reintroduced self and explained role of CSW. Family reports transportation is their largest need as they have many doctor appointments in [place] and [place]. Caretaker, Brayton Layman provides transportation for pt and spouse, but gas is a hardship currently. CSW reviewed options for gas assistance, such as [place], DSS and others. They report they were recently turned down for CNA assistance at home through [place], but stated they didn't need it currently. CSW educated pt and family on how to access possible gas assistance through DSS as well to help get to MD appointments. They plan to follow up. CSW also made referral to financial advocate, Lendell Caprice to assist as well. Family was very appreciative and report to be coping well currently.    Clinical Social Work interventions: Resource education Supportive listening, problem solving  Loren Racer, [place] Tuesdays 8:30-1pm Wednesdays 8:30-12pm  [phone]

## 2014-09-06 NOTE — Progress Notes (Signed)
D/C home with family. Continuous infusion pump intact, no complaints voiced.

## 2014-09-07 NOTE — Progress Notes (Signed)
Erroneous encounter

## 2014-09-11 ENCOUNTER — Encounter (HOSPITAL_BASED_OUTPATIENT_CLINIC_OR_DEPARTMENT_OTHER): Payer: Medicare HMO

## 2014-09-11 VITALS — BP 136/84 | HR 85 | Temp 97.7°F | Resp 16

## 2014-09-11 DIAGNOSIS — C801 Malignant (primary) neoplasm, unspecified: Secondary | ICD-10-CM

## 2014-09-11 DIAGNOSIS — Z5189 Encounter for other specified aftercare: Secondary | ICD-10-CM

## 2014-09-11 DIAGNOSIS — C770 Secondary and unspecified malignant neoplasm of lymph nodes of head, face and neck: Secondary | ICD-10-CM

## 2014-09-11 MED ORDER — PEGFILGRASTIM INJECTION 6 MG/0.6ML
6.0000 mg | Freq: Once | SUBCUTANEOUS | Status: AC
Start: 2014-09-11 — End: 2014-09-11
  Administered 2014-09-11: 6 mg via SUBCUTANEOUS

## 2014-09-11 MED ORDER — HEPARIN SOD (PORK) LOCK FLUSH 100 UNIT/ML IV SOLN
INTRAVENOUS | Status: AC
  Filled 2014-09-11: qty 5

## 2014-09-11 MED ORDER — HEPARIN SOD (PORK) LOCK FLUSH 100 UNIT/ML IV SOLN
500.0000 [IU] | Freq: Once | INTRAVENOUS | Status: AC | PRN
  Administered 2014-09-11: 500 [IU]

## 2014-09-11 MED ORDER — SODIUM CHLORIDE 0.9 % IJ SOLN
10.0000 mL | INTRAMUSCULAR | Status: DC | PRN
  Administered 2014-09-11: 10 mL

## 2014-09-11 MED ORDER — PEGFILGRASTIM INJECTION 6 MG/0.6ML
SUBCUTANEOUS | Status: AC
  Filled 2014-09-11: qty 0.6

## 2014-09-11 NOTE — Progress Notes (Signed)
D/C continuous infusion pump. Flushed port per protocol. De-accessed port. Patient denies any complaints from chemotherapy. Neck mass no longer visible. Brandon Hall presents today for injection per MD orders. Neulasta 6mg  administered SQ in left Abdomen. Administration without incident. Patient tolerated well.

## 2014-09-11 NOTE — Patient Instructions (Signed)
[facility] Discharge Instructions  MEDICATIONS PRESCRIBED:  Neulasta injection today. If the injection causes any bone pain you may take ibuprofen/aleve as needed.  INSTRUCTIONS/FOLLOW-UP: Scans as scheduled.  Return to clinic as scheduled for MD follow up.  Thank you for choosing [facility] to provide your oncology and hematology care.  To afford each patient quality time with our providers, please arrive at least 15 minutes before your scheduled appointment time.  With your help, our goal is to use those 15 minutes to complete the necessary work-up to ensure our physicians have the information they need to help with your evaluation and healthcare recommendations.    Effective January 1st, 2014, we ask that you re-schedule your appointment with our physicians should you arrive 10 or more minutes late for your appointment.  We strive to give you quality time with our providers, and arriving late affects you and other patients whose appointments are after yours.    Again, thank you for choosing [name].  Our hope is that these requests will decrease the amount of time that you wait before being seen by our physicians.       _____________________________________________________________  Should you have questions after your visit to [facility], please contact our office at [phone] between the hours of 8:30 a.m. and 4:30 p.m.  Voicemails left after 4:30 p.m. will not be returned until the following business day.  For prescription refill requests, have your pharmacy contact our office with your prescription refill request.    _______________________________________________________________  We hope that we have given you very good care.  You may receive a patient satisfaction survey in the mail, please complete it and return it as soon as possible.  We value your  feedback!  _______________________________________________________________  Have you asked about our STAR program?  STAR stands for Survivorship Training and Rehabilitation, and this is a nationally recognized cancer care program that focuses on survivorship and rehabilitation.  Cancer and cancer treatments may cause problems, such as, pain, making you feel tired and keeping you from doing the things that you need or want to do. Cancer rehabilitation can help. Our goal is to reduce these troubling effects and help you have the best quality of life possible.  You may receive a survey from a nurse that asks questions about your current state of health.  Based on the survey results, all eligible patients will be referred to the [facility] program for an evaluation so we can better serve you!  A frequently asked questions sheet is available upon request.

## 2014-09-18 ENCOUNTER — Ambulatory Visit (HOSPITAL_COMMUNITY)
Admission: RE | Admit: 2014-09-18 | Discharge: 2014-09-18 | Disposition: A | Discharge status: Home / Self Care | Payer: Medicare HMO | Source: Ambulatory Visit | Attending: Oncology | Admitting: Oncology

## 2014-09-18 DIAGNOSIS — C770 Secondary and unspecified malignant neoplasm of lymph nodes of head, face and neck: Secondary | ICD-10-CM | POA: Diagnosis present

## 2014-09-18 DIAGNOSIS — R911 Solitary pulmonary nodule: Secondary | ICD-10-CM

## 2014-09-18 DIAGNOSIS — R590 Localized enlarged lymph nodes: Secondary | ICD-10-CM

## 2014-09-18 MED ORDER — IOHEXOL 300 MG/ML  SOLN
120.0000 mL | Freq: Once | INTRAMUSCULAR | Status: AC | PRN
  Administered 2014-09-18: 120 mL via INTRAVENOUS

## 2014-09-21 ENCOUNTER — Inpatient Hospital Stay (HOSPITAL_COMMUNITY): Payer: Medicare HMO

## 2014-09-21 ENCOUNTER — Ambulatory Visit (HOSPITAL_COMMUNITY): Payer: Medicare HMO

## 2014-09-22 ENCOUNTER — Telehealth: Payer: Self-pay | Admitting: *Deleted

## 2014-09-22 NOTE — Telephone Encounter (Signed)
Spoke with caregiver Brayton Layman to check on patient.  She reported he continues to do well, not having any issues with ADLs.  They are waiting for results of a CT scan to determine whether he will have additional chemotherapy or begin RT.  I reminded her to call me with any questions/concerns.  She acknowledge she has my phone number and will do so.  Gayleen Orem, RN, BSN, [place] at [place] [phone]

## 2014-09-25 ENCOUNTER — Encounter (HOSPITAL_COMMUNITY): Payer: Self-pay | Admitting: Lab

## 2014-09-25 ENCOUNTER — Other Ambulatory Visit (HOSPITAL_COMMUNITY): Payer: Self-pay | Admitting: Hematology and Oncology

## 2014-09-25 DIAGNOSIS — C109 Malignant neoplasm of oropharynx, unspecified: Secondary | ICD-10-CM

## 2014-09-25 NOTE — Progress Notes (Signed)
Referral sent to Erin on 9/28.  Faxed reports

## 2014-09-26 ENCOUNTER — Other Ambulatory Visit (HOSPITAL_COMMUNITY): Payer: Self-pay | Admitting: Oncology

## 2014-09-26 ENCOUNTER — Encounter (HOSPITAL_COMMUNITY): Payer: Medicare HMO

## 2014-09-26 ENCOUNTER — Encounter (HOSPITAL_COMMUNITY): Payer: Self-pay | Admitting: Oncology

## 2014-09-27 ENCOUNTER — Other Ambulatory Visit: Payer: Self-pay | Admitting: Family Medicine

## 2014-09-27 ENCOUNTER — Encounter: Payer: Self-pay | Admitting: *Deleted

## 2014-09-27 ENCOUNTER — Encounter (HOSPITAL_BASED_OUTPATIENT_CLINIC_OR_DEPARTMENT_OTHER): Payer: Medicare HMO

## 2014-09-27 VITALS — BP 168/88 | HR 70 | Temp 97.5°F | Resp 16 | Wt 135.7 lb

## 2014-09-27 DIAGNOSIS — E039 Hypothyroidism, unspecified: Secondary | ICD-10-CM

## 2014-09-27 DIAGNOSIS — C770 Secondary and unspecified malignant neoplasm of lymph nodes of head, face and neck: Secondary | ICD-10-CM

## 2014-09-27 DIAGNOSIS — Z5111 Encounter for antineoplastic chemotherapy: Secondary | ICD-10-CM

## 2014-09-27 DIAGNOSIS — C760 Malignant neoplasm of head, face and neck: Secondary | ICD-10-CM

## 2014-09-27 DIAGNOSIS — C801 Malignant (primary) neoplasm, unspecified: Secondary | ICD-10-CM

## 2014-09-27 DIAGNOSIS — R911 Solitary pulmonary nodule: Secondary | ICD-10-CM

## 2014-09-27 DIAGNOSIS — Z8546 Personal history of malignant neoplasm of prostate: Secondary | ICD-10-CM

## 2014-09-27 DIAGNOSIS — I10 Essential (primary) hypertension: Secondary | ICD-10-CM

## 2014-09-27 DIAGNOSIS — J449 Chronic obstructive pulmonary disease, unspecified: Secondary | ICD-10-CM

## 2014-09-27 LAB — CBC WITH DIFFERENTIAL/PLATELET
BASOS PCT: 0 % (ref 0–1)
Basophils Absolute: 0 10*3/uL (ref 0.0–0.1)
Eosinophils Absolute: 0.1 10*3/uL (ref 0.0–0.7)
Eosinophils Relative: 2 % (ref 0–5)
HCT: 36.2 % — ABNORMAL LOW (ref 39.0–52.0)
HEMOGLOBIN: 11.7 g/dL — AB (ref 13.0–17.0)
Lymphocytes Relative: 39 % (ref 12–46)
Lymphs Abs: 1.9 10*3/uL (ref 0.7–4.0)
MCH: 27.9 pg (ref 26.0–34.0)
MCHC: 32.3 g/dL (ref 30.0–36.0)
MCV: 86.4 fL (ref 78.0–100.0)
Monocytes Absolute: 0.4 10*3/uL (ref 0.1–1.0)
Monocytes Relative: 9 % (ref 3–12)
NEUTROS ABS: 2.4 10*3/uL (ref 1.7–7.7)
NEUTROS PCT: 50 % (ref 43–77)
PLATELETS: 175 10*3/uL (ref 150–400)
RBC: 4.19 MIL/uL — ABNORMAL LOW (ref 4.22–5.81)
RDW: 22.5 % — AB (ref 11.5–15.5)
WBC: 4.8 10*3/uL (ref 4.0–10.5)

## 2014-09-27 LAB — COMPREHENSIVE METABOLIC PANEL
ALBUMIN: 3.7 g/dL (ref 3.5–5.2)
ALK PHOS: 86 U/L (ref 39–117)
ALT: 7 U/L (ref 0–53)
AST: 15 U/L (ref 0–37)
Anion gap: 8 (ref 5–15)
BILIRUBIN TOTAL: 0.2 mg/dL — AB (ref 0.3–1.2)
BUN: 16 mg/dL (ref 6–23)
CHLORIDE: 105 meq/L (ref 96–112)
CO2: 26 mEq/L (ref 19–32)
Calcium: 9 mg/dL (ref 8.4–10.5)
Creatinine, Ser: 0.94 mg/dL (ref 0.50–1.35)
GFR calc Af Amer: >90 mL/min (ref >90)
GFR calc non Af Amer: 90 mL/min — ABNORMAL LOW (ref >90)
Glucose, Bld: 92 mg/dL (ref 70–99)
POTASSIUM: 4 meq/L (ref 3.7–5.3)
SODIUM: 139 meq/L (ref 137–147)
Total Protein: 6.8 g/dL (ref 6.0–8.3)

## 2014-09-27 LAB — MAGNESIUM: MAGNESIUM: 2.3 mg/dL (ref 1.5–2.5)

## 2014-09-27 MED ORDER — SODIUM CHLORIDE 0.9 % IV SOLN
800.0000 mg/m2/d | INTRAVENOUS | Status: DC
  Administered 2014-09-27: 7100 mg via INTRAVENOUS
  Filled 2014-09-27: qty 142

## 2014-09-27 MED ORDER — PALONOSETRON HCL INJECTION 0.25 MG/5ML
0.2500 mg | Freq: Once | INTRAVENOUS | Status: AC
  Administered 2014-09-27: 0.25 mg via INTRAVENOUS
  Filled 2014-09-27: qty 5

## 2014-09-27 MED ORDER — SODIUM CHLORIDE 0.9 % IV SOLN
80.0000 mg/m2 | Freq: Once | INTRAVENOUS | Status: AC
  Administered 2014-09-27: 142 mg via INTRAVENOUS
  Filled 2014-09-27: qty 142

## 2014-09-27 MED ORDER — POTASSIUM CHLORIDE 2 MEQ/ML IV SOLN
INTRAVENOUS | Status: AC
  Administered 2014-09-27: 10:00:00 via INTRAVENOUS
  Filled 2014-09-27 (×2): qty 10

## 2014-09-27 MED ORDER — SODIUM CHLORIDE 0.9 % IV SOLN
Freq: Once | INTRAVENOUS | Status: AC
  Administered 2014-09-27: 09:00:00 via INTRAVENOUS

## 2014-09-27 MED ORDER — SODIUM CHLORIDE 0.9 % IV SOLN
Freq: Once | INTRAVENOUS | Status: AC
  Administered 2014-09-27: 09:00:00 via INTRAVENOUS
  Filled 2014-09-27: qty 5

## 2014-09-27 MED ORDER — SODIUM CHLORIDE 0.9 % IJ SOLN
10.0000 mL | INTRAMUSCULAR | Status: DC | PRN
  Administered 2014-09-27: 10 mL

## 2014-09-27 NOTE — Patient Instructions (Signed)
[facility] Discharge Instructions for Patients Receiving Chemotherapy  Today you received the following chemotherapy agents Cisplatin and 5FU pump.  To help prevent nausea and vomiting after your treatment, we encourage you to take your nausea medication as instructed. If you develop nausea and vomiting that is not controlled by your nausea medication, call the clinic. If it is after clinic hours your family physician or the after hours number for the clinic or go to the Emergency Department.   BELOW ARE SYMPTOMS THAT SHOULD BE REPORTED IMMEDIATELY:  *FEVER GREATER THAN 101.0 F  *CHILLS WITH OR WITHOUT FEVER  NAUSEA AND VOMITING THAT IS NOT CONTROLLED WITH YOUR NAUSEA MEDICATION  *UNUSUAL SHORTNESS OF BREATH  *UNUSUAL BRUISING OR BLEEDING  TENDERNESS IN MOUTH AND THROAT WITH OR WITHOUT PRESENCE OF ULCERS  *URINARY PROBLEMS  *BOWEL PROBLEMS  UNUSUAL RASH Items with * indicate a potential emergency and should be followed up as soon as possible.  Call with any issues/concerns prior to next appointment. Return as scheduled. PET scan in 2 weeks. Appointment with Radiation Oncology in 3 weeks. Return to this clinic in apx 2 months for follow up.   I have been informed and understand all the instructions given to me. I know to contact the clinic, my physician, or go to the Emergency Department if any problems should occur. I do not have any questions at this time, but understand that I may call the clinic during office hours or the Patient Navigator at [phone] should I have any questions or need assistance in obtaining follow up care.    __________________________________________  _____________  __________ Signature of Patient or Authorized Representative            Date                   Time    __________________________________________ Nurse's Signature

## 2014-09-27 NOTE — Progress Notes (Signed)
[place] Clinical Social Work  Clinical Social Work met with pt to reassess needs while he was receiving chemo. Pt and caregiver, Brayton Layman report they were able to receive assistance through [employer] with their Warehouse manager. They have also been able to attend some of their Support Programs there as well. Pt and family doing well emotionally and were very appreciative of the support and resources provided. They deny other concerns currently and agree to seek out CSW as needed.    Clinical Social Work interventions: Emotional support Resource follow up  Loren Racer, [place] Tuesdays 8:30-1pm Wednesdays 8:30-12pm  [phone]

## 2014-09-27 NOTE — Progress Notes (Signed)
[facility]  OFFICE PROGRESS NOTE  [place], [name], [place] [address]  DIAGNOSIS: Secondary and unspecified malignant neoplasm of lymph nodes of head, face, and neck  Pulmonary nodule  History of prostate cancer - Plan: PSA  No chief complaint on file.   CURRENT THERAPY: Cisplatin /5-FU (days 1-5 ) started 07/27/2014   INTERVAL HISTORY: Arrian Manson Disbrow 58 y.o. 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  He continues to do well with no peripheral paresthesias, dysphagia, oral pain or bleeding, headache, PND, orthopnea, palpitations, fever, night sweats, diarrhea, constipation, urinary hesitancy, incontinence, lower extremity swelling or redness, skin rash, headache, or seizures.  MEDICAL HISTORY: Past Medical History  Diagnosis Date  . Graves disease   . Hypothyroidism, postradioiodine therapy   . History of shingles     RASH  02-03-2014  PER PCP RESOLVING  . Prostate cancer     DX  SEVERAL  YRS   AGO  WITH RADIATION THERAPY  . Cataract, secondary obscuring vision     LEFT  . Blindness of right eye   . Hypertension     NO MEDS ,  MONITORED BY PCP  . Arthritis   . 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 Cancer     pharyngeal ca  . Squamous cell carcinoma of head and neck 07/12/2014    INTERIM HISTORY: has Hypertension; Graves disease; Blind right eye; Dyslipidemia; Abnormal transaminases; Hypothyroid; Shingles rash; Squamous cell carcinoma of head and neck; and History of prostate cancer on his problem list.   Oncology History    Secondary and unspecified malignant neoplasm of lymph nodes of head, face, and neck, likely nasopharynx in origin  Primary site: Pharynx - Nasopharynx  Staging method: AJCC 7th Edition  Clinical: Stage II (T1, N1, M0)  Summary: Stage II (T1, N1, M0)       Secondary and unspecified malignant neoplasm of lymph nodes of head, face, and neck    05/05/2014  Imaging  CT scan of the neck show enlarged left lymph node measure less than 6 cm.    05/25/2014  Pathology Results  [phone] is positive for squamous cell carcinoma.    05/25/2014  Procedure  Lymph node biopsy of the left neck came back squamous cell carcinoma.    06/20/2014  Imaging  PET CT scan show no evidence of distant metastatic disease.    06/29/2014  Surgery  Laryngoscopy and random biopsy of the nasopharynx was negative for malignancy.    07/27/2014 -  Chemotherapy  Cisplatin + 5FU (days 1-5) every 28 days      ALLERGIES:  has No Known Allergies.  MEDICATIONS: has a current medication list which includes the following prescription(s): cisplatin, dextrose 5 % SOLN 1,000 mL with fluorouracil 5 GM/100ML SOLN, hydrochlorothiazide, hydrocodone-acetaminophen, levothyroxine, lidocaine-prilocaine, metoclopramide, and prochlorperazine.  SURGICAL HISTORY:  Past Surgical History  Procedure Laterality Date  . Colonoscopy  10/20/2011    Procedure: COLONOSCOPY;  Surgeon: Daneil Dolin, MD;  [facility];  Service: Endoscopy;  Laterality: N/A;  1:00 pm  . Cryoablation N/A 04/07/2014    Procedure: SALVAGE CRYO ABLATION PROSTATE;  Surgeon: Lowella Bandy, MD;  Location: [facility];  Service: Urology;  Laterality: N/A;  . Laryngoscopy    . Lymph node biopsy      FAMILY HISTORY: family history includes Colon  cancer in his father; Hypertension in his mother.  SOCIAL HISTORY:  reports that he quit smoking about a year ago. His smoking use included Cigarettes. He has a 40 pack-year smoking history. He has never used smokeless tobacco. He reports that he does not drink alcohol or use illicit drugs.  REVIEW OF SYSTEMS:  Other than that discussed above is noncontributory.  PHYSICAL EXAMINATION: ECOG PERFORMANCE STATUS: 0 - Asymptomatic  There were no vitals taken for this  visit.  GENERAL:alert, no distress and comfortable SKIN: skin color, texture, turgor are normal, no rashes or significant lesions EYES:  right cornea opaque. Left pupil reactive to light. SINUSES: No redness or tenderness over maxillary or ethmoid sinuses OROPHARYNX:no exudate, no erythema on lips, buccal mucosa, or tongue. Edentulous. NECK: supple, thyroid normal size, non-tender, without nodularity. No masses CHEST: Increased AP diameter with no gynecomastia. LYMPH:  no palpable lymphadenopathy in the cervical, axillary or inguinal LUNGS: clear to auscultation and percussion with normal breathing effort HEART: regular rate & rhythm and no murmurs. ABDOMEN:abdomen soft, non-tender and normal bowel sounds MUSCULOSKELETAL:no cyanosis of digits and no clubbing. Range of motion normal.  NEURO: alert & oriented x 3 with fluent speech, no focal motor/sensory deficits   LABORATORY DATA: Infusion on 09/06/2014  Component Date Value Ref Range Status  . WBC 09/06/2014 3.0* 4.0 - 10.5 K/uL Final  . RBC 09/06/2014 4.62  4.22 - 5.81 MIL/uL Final  . Hemoglobin 09/06/2014 12.7* 13.0 - 17.0 g/dL Final  . HCT 87/86/7672 38.7* 39.0 - 52.0 % Final  . MCV 09/06/2014 83.8  78.0 - 100.0 fL Final  . MCH 09/06/2014 27.5  26.0 - 34.0 pg Final  . MCHC 09/06/2014 32.8  30.0 - 36.0 g/dL Final  . RDW 09/47/0962 19.7* 11.5 - 15.5 % Final  . Platelets 09/06/2014 162  150 - 400 K/uL Final  . Neutrophils Relative % 09/06/2014 26* 43 - 77 % Final  . Neutro Abs 09/06/2014 0.8* 1.7 - 7.7 K/uL Final  . Lymphocytes Relative 09/06/2014 60* 12 - 46 % Final  . Lymphs Abs 09/06/2014 1.8  0.7 - 4.0 K/uL Final  . Monocytes Relative 09/06/2014 10  3 - 12 % Final  . Monocytes Absolute 09/06/2014 0.3  0.1 - 1.0 K/uL Final  . Eosinophils Relative 09/06/2014 4  0 - 5 % Final  . Eosinophils Absolute 09/06/2014 0.1  0.0 - 0.7 K/uL Final  . Basophils Relative 09/06/2014 0  0 - 1 % Final  . Basophils Absolute 09/06/2014 0.0  0.0  - 0.1 K/uL Final  . Sodium 09/06/2014 138  137 - 147 mEq/L Final  . Potassium 09/06/2014 3.9  3.7 - 5.3 mEq/L Final  . Chloride 09/06/2014 101  96 - 112 mEq/L Final  . CO2 09/06/2014 25  19 - 32 mEq/L Final  . Glucose, Bld 09/06/2014 98  70 - 99 mg/dL Final  . BUN 83/66/2947 18  6 - 23 mg/dL Final  . Creatinine, Ser 09/06/2014 0.73  0.50 - 1.35 mg/dL Final  . Calcium 65/46/5035 9.3  8.4 - 10.5 mg/dL Final  . Total Protein 09/06/2014 7.2  6.0 - 8.3 g/dL Final  . Albumin 46/56/8127 3.9  3.5 - 5.2 g/dL Final  . AST 51/70/0174 17  0 - 37 U/L Final  . ALT 09/06/2014 8  0 - 53 U/L Final  . Alkaline Phosphatase 09/06/2014 72  39 - 117 U/L Final  . Total Bilirubin 09/06/2014 0.4  0.3 - 1.2 mg/dL Final  . GFR calc  non Af Amer 09/06/2014 >90  >90 mL/min Final  . GFR calc Af Amer 09/06/2014 >90  >90 mL/min Final   Comment: (NOTE)                          The eGFR has been calculated using the CKD EPI equation.                          This calculation has not been validated in all clinical situations.                          eGFR's persistently <90 mL/min signify possible Chronic Kidney                          Disease.  . Anion gap 09/06/2014 12  5 - 15 Final  . Magnesium 09/06/2014 2.1  1.5 - 2.5 mg/dL Final    PATHOLOGY: Squamous cell carcinoma  Urinalysis No results found for this basename: colorurine, appearanceur, labspec, phurine, glucoseu, hgbur, bilirubinur, ketonesur, proteinur, urobilinogen, nitrite, leukocytesur    RADIOGRAPHIC STUDIES: Ct Soft Tissue Neck W Contrast  09/18/2014   CLINICAL DATA:  58 year old male with malignant left lymph nodes, positive for squamous cell carcinoma on histology, with possible left nasopharyngeal primary by PET-CT, but the nasopharynx was negative on biopsy. Re-staging. Subsequent encounter.  EXAM: CT NECK WITH CONTRAST  TECHNIQUE: Multidetector CT imaging of the neck was performed using the standard protocol following the bolus administration of  intravenous contrast.  CONTRAST:  OMNIPAQUE IOHEXOL 300 MG/ML SOLN in conjunction with contrast enhanced imaging of the chest, abdomen, and pelvis reported separately.  COMPARISON:  Brain MRI 07/21/2014.  Pretreatment neck CT 05/05/2014.  FINDINGS: Chest findings today are reported separately.  Right chest porta cath partially visible.  Submental benign lipoma incidentally re- identified. Stable, diminutive thyroid. Larynx is stable and negative. Retropharyngeal and parapharyngeal spaces are stable and negative. Sublingual space, submandibular glands, and parotid glands are stable and within normal limits.  As before, no pharyngeal mass is identified. Regression of the cystic nodal metastasis at the left level 2 B station, today measuring 18 x 10 x 25 mm (previously 31 mm diameter). Obscured soft tissue plane surrounding this lesion. Regression of the smaller hyperenhancing and partially cystic or necrotic lymph nodes along superior margin of this dominant lesion, now measuring up to 8 mm short axis (12-13 mm previously).  No new or increased cervical lymph nodes.  The left internal jugular vein was non dominant and may no longer be patent. Other major vascular structures in the neck and at the skullbase are patent and stable.  Stable visualized brain parenchyma, including the cavernous vascular malformation of the right inferior cerebellum.  Stable orbit soft tissues. Stable visualized paranasal sinuses and mastoids. Advanced degenerative changes in the cervical spine and at the distal right clavicle. Absent dentition as before. No suspicious osseous lesion in the neck.  Small superficial left face probable sebaceous cysts are unchanged.  IMPRESSION: 1. Regression of metastatic left level 2 lymphadenopathy. As before, no pharyngeal or laryngeal primary is identified. 2. No progression or new abnormality identified in the neck. CT chest abdomen and pelvis today reported separately.   Electronically Signed    By: Augusto Gamble M.D.   On: 09/18/2014 12:28   Ct Chest, Abdomen, and Pelvis W Contrast  09/18/2014  CLINICAL DATA:  Advanced head neck cancer.  EXAM: CT CHEST, ABDOMEN, AND PELVIS WITH CONTRAST  TECHNIQUE: Multidetector CT imaging of the chest, abdomen and pelvis was performed following the standard protocol during bolus administration of intravenous contrast.  CONTRAST:  166mL OMNIPAQUE IOHEXOL 300 MG/ML  SOLN  COMPARISON:  None.  FINDINGS: CT CHEST FINDINGS  Mediastinum: Normal heart size. No pericardial effusion. Atherosclerotic disease involves the thoracic aorta as well as the left circumflex coronary artery. The trachea appears patent and is midline. The esophagus appears normal. No enlarged mediastinal or hilar lymph nodes. No enlarged axillary or supraclavicular lymph nodes.  Lungs/Pleura: No pleural effusion identified. Moderate changes of centrilobular emphysema identified. No airspace consolidation identified. There is a nodule in the right middle lobe which measures 5 mm. This is unchanged from 12/27/2013. No new or enlarging nodules identified.  CT ABDOMEN AND PELVIS FINDINGS  Hepatobiliary: Within the dome of liver there is a 6 mm hyper enhancing structure, unchanged from 03/09/2014. Enhancing lesion within the lateral segment of left hepatic lobe is favored to represent a flash fill hemangioma, image 57/series 3. Stable from previous exam. Gallbladder appears normal. No biliary dilatation.  Spleen: Appears normal.  Pancreas: Within normal limits.  Stomach/Bowel: The stomach is normal. The small bowel loops have a normal course and caliber. Normal appearance of the colon.  Urinary tract: The adrenal glands are both normal. Similar appearance of bilateral renal cysts. The urinary bladder appears normal.  Vascular/Lymphatic: Calcified atherosclerotic disease involves the abdominal aorta. No aneurysm. There is no retroperitoneal adenopathy. No mesenteric adenopathy. No enlarged pelvic or inguinal  adenopathy.  Muskuloskeletal: Review of the visualized bony structures is negative for aggressive lytic or sclerotic bone lesion.  Other: No free fluid identified within the abdomen or pelvis  IMPRESSION: 1. Right middle lobe nodule is unchanged from 12/27/2013. 2. There are several enhancing lesions within the liver parenchyma. These are stable compared with 03/09/2014 and are favored to represent a benign abnormality such is flash fill hemangiomas. 3. No specific evidence to suggest metastatic disease to the chest abdomen or pelvis. 4. Atherosclerotic disease.   Electronically Signed   By: Kerby Moors M.D.   On: 09/18/2014 14:23      ASSESSMENT:  1. Locally advanced squamous cell carcinoma of head and neck, HPV N/A. No apparent primary site. Systemic chemotherapy consisting of Cisplatin and 5FU (days 1-5) every 21 days starting on 07/27/2014, near complete response on CT scan..  2. COPD.  3. Hypothyroidism  4. Right eye corneal opacity with blindness.  5. History of Graves' disease  6. Hypertension, controlled    PLAN:  #1. Cycle 4 cisplatin/5-FU neoadjuvant chemotherapy today. #2. PET scan in 2 weeks to evaluate what residual abnormality is noted on CT scan. #3. Radiotherapy consultation in 3 weeks at [facility] with Dr. Isidore Moos a possible. #4. Followup here in 2 months with CBC and chem profile with [facility].   All questions were answered. The patient knows to call the clinic with any problems, questions or concerns. We can certainly see the patient much sooner if necessary.   I spent 25 minutes counseling the patient face to face. The total time spent in the appointment was 30 minutes.    Doroteo Bradford, MD 09/27/2014 8:39 AM  DISCLAIMER:  This note was dictated with voice recognition software.  Similar sounding words can inadvertently be transcribed inaccurately and may not be corrected upon review.

## 2014-09-27 NOTE — Progress Notes (Signed)
Tolerated chemotherapy without any complaints.

## 2014-09-28 LAB — PSA: PSA: 1.22 ng/mL (ref <=4.00)

## 2014-10-02 ENCOUNTER — Encounter (HOSPITAL_COMMUNITY): Payer: Self-pay | Admitting: Lab

## 2014-10-02 ENCOUNTER — Encounter (HOSPITAL_COMMUNITY): Payer: Medicare HMO | Attending: Hematology

## 2014-10-02 ENCOUNTER — Encounter (HOSPITAL_COMMUNITY): Payer: Commercial Managed Care - HMO | Attending: Hematology

## 2014-10-02 VITALS — BP 126/83 | HR 78 | Temp 97.3°F | Resp 18

## 2014-10-02 DIAGNOSIS — C770 Secondary and unspecified malignant neoplasm of lymph nodes of head, face and neck: Secondary | ICD-10-CM | POA: Insufficient documentation

## 2014-10-02 DIAGNOSIS — R590 Localized enlarged lymph nodes: Secondary | ICD-10-CM | POA: Insufficient documentation

## 2014-10-02 DIAGNOSIS — Z8546 Personal history of malignant neoplasm of prostate: Secondary | ICD-10-CM | POA: Insufficient documentation

## 2014-10-02 DIAGNOSIS — R911 Solitary pulmonary nodule: Secondary | ICD-10-CM | POA: Insufficient documentation

## 2014-10-02 DIAGNOSIS — Z452 Encounter for adjustment and management of vascular access device: Secondary | ICD-10-CM

## 2014-10-02 DIAGNOSIS — Z5189 Encounter for other specified aftercare: Secondary | ICD-10-CM

## 2014-10-02 DIAGNOSIS — C760 Malignant neoplasm of head, face and neck: Secondary | ICD-10-CM | POA: Insufficient documentation

## 2014-10-02 DIAGNOSIS — C103 Malignant neoplasm of posterior wall of oropharynx: Secondary | ICD-10-CM | POA: Insufficient documentation

## 2014-10-02 DIAGNOSIS — C801 Malignant (primary) neoplasm, unspecified: Secondary | ICD-10-CM

## 2014-10-02 MED ORDER — HEPARIN SOD (PORK) LOCK FLUSH 100 UNIT/ML IV SOLN
INTRAVENOUS | Status: AC
  Filled 2014-10-02: qty 5

## 2014-10-02 MED ORDER — PEGFILGRASTIM INJECTION 6 MG/0.6ML
SUBCUTANEOUS | Status: AC
  Filled 2014-10-02: qty 0.6

## 2014-10-02 MED ORDER — SODIUM CHLORIDE 0.9 % IJ SOLN
10.0000 mL | INTRAMUSCULAR | Status: DC | PRN
  Administered 2014-10-02: 10 mL

## 2014-10-02 MED ORDER — PEGFILGRASTIM INJECTION 6 MG/0.6ML
6.0000 mg | Freq: Once | SUBCUTANEOUS | Status: AC
  Administered 2014-10-02: 6 mg via SUBCUTANEOUS

## 2014-10-02 MED ORDER — HEPARIN SOD (PORK) LOCK FLUSH 100 UNIT/ML IV SOLN
500.0000 [IU] | Freq: Once | INTRAVENOUS | Status: AC | PRN
  Administered 2014-10-02: 500 [IU]

## 2014-10-02 NOTE — Progress Notes (Signed)
Referral made to Rad Onc on 10/27

## 2014-10-02 NOTE — Progress Notes (Signed)
Patient was disconnected from continuous 35fu infusion.  Patient tolerated treatment well.  Port to right chest wall flushed with 500 units of Heparin.   Brandon Hall presents today for injection per the provider's orders.  Neulasta 6mg  administration without incident; see MAR for injection details.  Patient tolerated procedure well and without incident.  No questions or complaints noted at this time.

## 2014-10-06 ENCOUNTER — Ambulatory Visit (INDEPENDENT_AMBULATORY_CARE_PROVIDER_SITE_OTHER): Payer: Medicare HMO | Admitting: Family

## 2014-10-06 ENCOUNTER — Encounter: Payer: Self-pay | Admitting: Family

## 2014-10-06 VITALS — BP 101/75 | HR 91 | Temp 97.0°F | Ht 72.0 in | Wt 126.8 lb

## 2014-10-06 DIAGNOSIS — E038 Other specified hypothyroidism: Secondary | ICD-10-CM

## 2014-10-06 DIAGNOSIS — I10 Essential (primary) hypertension: Secondary | ICD-10-CM

## 2014-10-06 MED ORDER — HYDROCHLOROTHIAZIDE 12.5 MG PO TABS: 12.5000 mg | ORAL_TABLET | Freq: Every day | ORAL | Status: DC

## 2014-10-06 NOTE — Progress Notes (Signed)
Subjective:    Patient ID: LAJUAN PASCHEN, male    DOB: 1956-07-09, 58 y.o.   MRN: [id number]  Hypertension This is a chronic problem. The current episode started more than 1 year ago. The problem has been resolved since onset. The problem is controlled. Pertinent negatives include no anxiety, headaches, palpitations, peripheral edema or shortness of breath. Risk factors for coronary artery disease include dyslipidemia and male gender. Past treatments include diuretics. The current treatment provides significant improvement. There is no history of kidney disease, CAD/MI, CVA, heart failure or a thyroid problem. There is no history of sleep apnea.  Thyroid Problem Presents for follow-up visit. Patient reports no constipation, depressed mood, diarrhea, dry skin, palpitations or weight gain. The symptoms have been improving. Past treatments include levothyroxine. The treatment provided significant relief. There is no history of heart failure.   *Pt currently being treated for Head and Neck cancer   Review of Systems  Constitutional: Negative.  Negative for weight gain.  HENT: Negative.   Respiratory: Negative.  Negative for shortness of breath.   Cardiovascular: Negative.  Negative for palpitations.  Gastrointestinal: Negative.  Negative for diarrhea and constipation.  Endocrine: Negative.   Genitourinary: Negative.   Musculoskeletal: Negative.   Neurological: Negative.  Negative for headaches.  Hematological: Negative.   Psychiatric/Behavioral: Negative.   All other systems reviewed and are negative.      Objective:   Physical Exam  Vitals reviewed. Constitutional: He is oriented to person, place, and time. He appears well-developed. Ill appearance: Pt thin. No distress.  HENT:  Head: Normocephalic.  Right Ear: External ear normal.  Left Ear: External ear normal.  Nose: Nose normal.  Mouth/Throat: Oropharynx is clear and moist.  Eyes: Pupils are equal, round, and reactive to light.  Right eye exhibits no discharge. Left eye exhibits no discharge.  Neck: Normal range of motion. Neck supple. No thyromegaly present.  Cardiovascular: Normal rate, regular rhythm, normal heart sounds and intact distal pulses.   No murmur heard. Pulmonary/Chest: Effort normal. No respiratory distress. He has no wheezes.  Diminished breath sounds   Abdominal: Soft. Bowel sounds are normal. He exhibits no distension. There is no tenderness.  Musculoskeletal: Normal range of motion. He exhibits no edema and no tenderness.  Neurological: He is alert and oriented to person, place, and time. He has normal reflexes. No cranial nerve deficit.  Skin: Skin is warm and dry. No rash noted. No erythema.  Psychiatric: He has a normal mood and affect. His behavior is normal. Judgment and thought content normal.    BP 101/75  Pulse 91  Temp(Src) 97 F (36.1 C) (Oral)  Ht 6' (1.829 m)  Wt 126 lb 12.8 oz (57.516 kg)  BMI 17.19 kg/m2       Assessment & Plan:  1. Essential hypertension - hydrochlorothiazide (HYDRODIURIL) 12.5 MG tablet; Take 1 tablet (12.5 mg total) by mouth daily.  Dispense: 90 tablet; Refill: 3 - CMP14+EGFR  2. Other specified hypothyroidism - CMP14+EGFR - Thyroid Panel With TSH   Continue all meds Labs pending Health Maintenance reviewed Diet and exercise encouraged RTO 6 months  Jannifer Rodney, FNP

## 2014-10-06 NOTE — Patient Instructions (Signed)

## 2014-10-07 LAB — CMP14+EGFR
ALBUMIN: 4.7 g/dL (ref 3.5–5.5)
ALT: 9 IU/L (ref 0–44)
AST: 16 IU/L (ref 0–40)
Albumin/Globulin Ratio: 1.7 (ref 1.1–2.5)
Alkaline Phosphatase: 187 IU/L — ABNORMAL HIGH (ref 39–117)
BUN / CREAT RATIO: 16 (ref 9–20)
BUN: 18 mg/dL (ref 6–24)
CALCIUM: 9.3 mg/dL (ref 8.7–10.2)
CO2: 24 mmol/L (ref 18–29)
CREATININE: 1.16 mg/dL (ref 0.76–1.27)
Chloride: 88 mmol/L — ABNORMAL LOW (ref 97–108)
GFR calc Af Amer: 79 mL/min/{1.73_m2} (ref >59)
GFR calc non Af Amer: 69 mL/min/{1.73_m2} (ref >59)
GLOBULIN, TOTAL: 2.7 g/dL (ref 1.5–4.5)
Glucose: 93 mg/dL (ref 65–99)
Potassium: 3.6 mmol/L (ref 3.5–5.2)
Sodium: 134 mmol/L (ref 134–144)
Total Bilirubin: 0.6 mg/dL (ref 0.0–1.2)
Total Protein: 7.4 g/dL (ref 6.0–8.5)

## 2014-10-07 LAB — THYROID PANEL WITH TSH
Free Thyroxine Index: 2.1 (ref 1.2–4.9)
T3 UPTAKE RATIO: 17 % — AB (ref 24–39)
T4 TOTAL: 12.3 ug/dL — AB (ref 4.5–12.0)
TSH: 31.29 u[IU]/mL — ABNORMAL HIGH (ref 0.450–4.500)

## 2014-10-09 ENCOUNTER — Other Ambulatory Visit: Payer: Self-pay | Admitting: Family

## 2014-10-09 MED ORDER — LEVOTHYROXINE SODIUM 100 MCG PO TABS: 100.0000 ug | ORAL_TABLET | Freq: Every day | ORAL | Status: DC

## 2014-10-11 ENCOUNTER — Ambulatory Visit (HOSPITAL_COMMUNITY): Admission: RE | Admit: 2014-10-11 | Payer: Medicare HMO | Source: Ambulatory Visit

## 2014-10-18 ENCOUNTER — Encounter (HOSPITAL_COMMUNITY)
Admission: RE | Admit: 2014-10-18 | Discharge: 2014-10-18 | Disposition: A | Discharge status: Home / Self Care | Payer: Medicare HMO | Source: Ambulatory Visit | Attending: Diagnostic Radiology | Admitting: Diagnostic Radiology

## 2014-10-18 ENCOUNTER — Encounter (HOSPITAL_COMMUNITY): Payer: Self-pay

## 2014-10-18 DIAGNOSIS — C770 Secondary and unspecified malignant neoplasm of lymph nodes of head, face and neck: Secondary | ICD-10-CM

## 2014-10-18 DIAGNOSIS — R911 Solitary pulmonary nodule: Secondary | ICD-10-CM | POA: Insufficient documentation

## 2014-10-18 DIAGNOSIS — J439 Emphysema, unspecified: Secondary | ICD-10-CM | POA: Diagnosis not present

## 2014-10-18 DIAGNOSIS — M799 Soft tissue disorder, unspecified: Secondary | ICD-10-CM | POA: Diagnosis not present

## 2014-10-18 LAB — GLUCOSE, CAPILLARY: GLUCOSE-CAPILLARY: 91 mg/dL (ref 70–99)

## 2014-10-18 MED ORDER — FLUDEOXYGLUCOSE F - 18 (FDG) INJECTION
6.3300 | Freq: Once | INTRAVENOUS | Status: AC | PRN
  Administered 2014-10-18: 6.33 via INTRAVENOUS

## 2014-10-19 ENCOUNTER — Encounter: Payer: Self-pay | Admitting: *Deleted

## 2014-10-19 NOTE — Progress Notes (Signed)
Brandon Hall  Clinical Social Hall was referred by ED CSW at [employer] for assessment of psychosocial needs due to financial and transportation concerns.  Clinical Social Worker contacted patient and family member, Brandon Hall that assists with transportation to offer support and assess for needs.  Pt has appt next week in [place] for possible radiation treatment. Pt continues to have many doctor appointments in [place] and [place]. Pt lives in [place]. CSW has put in request to financial advocate at [facility] for additional assistance and also suggested there may be funds through [place] for transportation. Pt does not have the type of medicaid that covers [facility] and only gets assistance with his medicare coverage. CSW awaits return call and will continue to follow for transportation and other concerns.   Clinical Social Hall interventions: Resource education Pt advocacy  Brandon Hall, [place] Worker [place]  [place] Phone: [phone] Fax: [phone]

## 2014-10-20 ENCOUNTER — Telehealth (HOSPITAL_COMMUNITY): Payer: Self-pay | Admitting: Hematology and Oncology

## 2014-10-20 NOTE — Telephone Encounter (Signed)
Pt/wife called today around 11am requesting help paying for medical bills. They were advised that we do not have any assistance to help with bills and our funds are limited. They stated that "[facility]" helped them more and they were going to transfer back over there.  I notified Sherry Ruffing and Dr Farrel Gobble. I will contact the pt to discuss the transfer process.  [facility] [phone]

## 2014-10-27 ENCOUNTER — Telehealth (HOSPITAL_COMMUNITY): Payer: Self-pay | Admitting: *Deleted

## 2014-10-27 NOTE — Telephone Encounter (Signed)
[name] called to give [place] an update on Brandon Hall and his XRT. He has been fitted for his mask but has not started XRT yet. No feeding tube @ present.

## 2014-11-27 ENCOUNTER — Ambulatory Visit (HOSPITAL_COMMUNITY): Payer: Medicare HMO | Admitting: Oncology

## 2014-11-27 ENCOUNTER — Ambulatory Visit (HOSPITAL_COMMUNITY): Payer: Medicare HMO

## 2014-11-27 ENCOUNTER — Encounter (HOSPITAL_COMMUNITY): Payer: Medicare HMO

## 2014-11-27 ENCOUNTER — Other Ambulatory Visit (HOSPITAL_COMMUNITY): Payer: Medicare HMO

## 2014-11-27 ENCOUNTER — Encounter (HOSPITAL_COMMUNITY): Payer: Commercial Managed Care - HMO

## 2014-11-27 ENCOUNTER — Encounter (HOSPITAL_COMMUNITY): Payer: Self-pay

## 2014-11-27 ENCOUNTER — Encounter (HOSPITAL_COMMUNITY): Payer: Commercial Managed Care - HMO | Attending: Hematology

## 2014-11-27 VITALS — BP 169/85 | HR 68 | Temp 97.8°F | Resp 20 | Wt 139.9 lb

## 2014-11-27 DIAGNOSIS — E039 Hypothyroidism, unspecified: Secondary | ICD-10-CM

## 2014-11-27 DIAGNOSIS — C801 Malignant (primary) neoplasm, unspecified: Secondary | ICD-10-CM

## 2014-11-27 DIAGNOSIS — C109 Malignant neoplasm of oropharynx, unspecified: Secondary | ICD-10-CM

## 2014-11-27 DIAGNOSIS — C760 Malignant neoplasm of head, face and neck: Secondary | ICD-10-CM | POA: Insufficient documentation

## 2014-11-27 DIAGNOSIS — C770 Secondary and unspecified malignant neoplasm of lymph nodes of head, face and neck: Secondary | ICD-10-CM

## 2014-11-27 DIAGNOSIS — Z452 Encounter for adjustment and management of vascular access device: Secondary | ICD-10-CM

## 2014-11-27 DIAGNOSIS — Z95828 Presence of other vascular implants and grafts: Secondary | ICD-10-CM

## 2014-11-27 LAB — COMPREHENSIVE METABOLIC PANEL
ALK PHOS: 74 U/L (ref 39–117)
ALT: 8 U/L (ref 0–53)
AST: 18 U/L (ref 0–37)
Albumin: 4 g/dL (ref 3.5–5.2)
Anion gap: 14 (ref 5–15)
BILIRUBIN TOTAL: 0.3 mg/dL (ref 0.3–1.2)
BUN: 15 mg/dL (ref 6–23)
CALCIUM: 9.6 mg/dL (ref 8.4–10.5)
CHLORIDE: 99 meq/L (ref 96–112)
CO2: 27 mEq/L (ref 19–32)
Creatinine, Ser: 0.76 mg/dL (ref 0.50–1.35)
GLUCOSE: 112 mg/dL — AB (ref 70–99)
POTASSIUM: 3.6 meq/L — AB (ref 3.7–5.3)
Sodium: 140 mEq/L (ref 137–147)
Total Protein: 7.4 g/dL (ref 6.0–8.3)

## 2014-11-27 LAB — CBC WITH DIFFERENTIAL/PLATELET
BASOS ABS: 0 10*3/uL (ref 0.0–0.1)
Basophils Relative: 0 % (ref 0–1)
EOS PCT: 3 % (ref 0–5)
Eosinophils Absolute: 0.1 10*3/uL (ref 0.0–0.7)
HCT: 39 % (ref 39.0–52.0)
Hemoglobin: 12.9 g/dL — ABNORMAL LOW (ref 13.0–17.0)
LYMPHS ABS: 0.7 10*3/uL (ref 0.7–4.0)
Lymphocytes Relative: 19 % (ref 12–46)
MCH: 30.1 pg (ref 26.0–34.0)
MCHC: 33.1 g/dL (ref 30.0–36.0)
MCV: 90.9 fL (ref 78.0–100.0)
Monocytes Absolute: 0.5 10*3/uL (ref 0.1–1.0)
Monocytes Relative: 14 % — ABNORMAL HIGH (ref 3–12)
NEUTROS ABS: 2.2 10*3/uL (ref 1.7–7.7)
NEUTROS PCT: 64 % (ref 43–77)
PLATELETS: 122 10*3/uL — AB (ref 150–400)
RBC: 4.29 MIL/uL (ref 4.22–5.81)
RDW: 16.2 % — AB (ref 11.5–15.5)
WBC: 3.5 10*3/uL — AB (ref 4.0–10.5)

## 2014-11-27 LAB — LACTATE DEHYDROGENASE: LDH: 245 U/L (ref 94–250)

## 2014-11-27 MED ORDER — HEPARIN SOD (PORK) LOCK FLUSH 100 UNIT/ML IV SOLN
500.0000 [IU] | Freq: Once | INTRAVENOUS | Status: AC
  Administered 2014-11-27: 500 [IU] via INTRAVENOUS
  Filled 2014-11-27: qty 5

## 2014-11-27 MED ORDER — SODIUM CHLORIDE 0.9 % IJ SOLN
10.0000 mL | INTRAMUSCULAR | Status: DC | PRN
  Administered 2014-11-27: 10 mL via INTRAVENOUS
  Filled 2014-11-27: qty 10

## 2014-11-27 NOTE — Progress Notes (Signed)
Brandon Hall presented for Portacath access and flush.  Portacath located right chest wall accessed with  H 20 needle.  Good blood return present. Portacath flushed with 6ml NS and 500U/26ml Heparin and needle removed intact.  Procedure tolerated well and without incident.

## 2014-11-27 NOTE — Patient Instructions (Addendum)
[facility] Discharge Instructions  RECOMMENDATIONS MADE BY THE CONSULTANT AND ANY TEST RESULTS WILL BE SENT TO YOUR REFERRING PHYSICIAN.  We will see you back in 6 weeks for office visit and repeat lab work. We will do a CBC, CMET and LDH at that time.  Please call for any questions or concerns.    Thank you for choosing [facility] to provide your oncology and hematology care.  To afford each patient quality time with our providers, please arrive at least 15 minutes before your scheduled appointment time.  With your help, our goal is to use those 15 minutes to complete the necessary work-up to ensure our physicians have the information they need to help with your evaluation and healthcare recommendations.    Effective January 1st, 2014, we ask that you re-schedule your appointment with our physicians should you arrive 10 or more minutes late for your appointment.  We strive to give you quality time with our providers, and arriving late affects you and other patients whose appointments are after yours.    Again, thank you for choosing [facility].  Our hope is that these requests will decrease the amount of time that you wait before being seen by our physicians.       _____________________________________________________________  Should you have questions after your visit to [facility], please contact our office at [phone] between the hours of 8:30 a.m. and 4:30 p.m.  Voicemails left after 4:30 p.m. will not be returned until the following business day.  For prescription refill requests, have your pharmacy contact our office with your prescription refill request.    _______________________________________________________________  We hope that we have given you very good care.  You may receive a patient satisfaction survey in the mail, please complete it and return it as soon as possible.  We value your  feedback!  _______________________________________________________________  Have you asked about our STAR program?  STAR stands for Survivorship Training and Rehabilitation, and this is a nationally recognized cancer care program that focuses on survivorship and rehabilitation.  Cancer and cancer treatments may cause problems, such as, pain, making you feel tired and keeping you from doing the things that you need or want to do. Cancer rehabilitation can help. Our goal is to reduce these troubling effects and help you have the best quality of life possible.  You may receive a survey from a nurse that asks questions about your current state of health.  Based on the survey results, all eligible patients will be referred to the [facility] program for an evaluation so we can better serve you!  A frequently asked questions sheet is available upon request.

## 2014-11-27 NOTE — Progress Notes (Signed)
[facility]  OFFICE PROGRESS NOTE  Brandon Hall, Brandon Hall, [place] [address]  DIAGNOSIS: Port catheter in place - Plan: Schedule Portacath Flush Appointment, heparin lock flush 100 unit/mL, sodium chloride 0.9 % injection 10 mL  Secondary and unspecified malignant neoplasm of lymph nodes of head, face, and neck - Plan: CBC with Differential  Cancer of oropharynx - Plan: Comprehensive metabolic panel, CBC with Differential, Comprehensive metabolic panel, Lactate dehydrogenase  Squamous cell carcinoma of head and neck - Plan: Lactate dehydrogenase  Chief Complaint  Patient presents with  . Carcinoma oropharynx    CURRENT THERAPY: Radiotherapy #14  of a planned 32 administered today. Preceded by 4 cycles of cisplatin/5-FU last cycle begun on 09/27/2014.  INTERVAL HISTORY: Brandon Hall 58 y.o. 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 Radiotherapy was started on 11/06/2014. He has gained 13 pounds in the last 4 weeks. Appetite is excellent with no sore mouth, epistaxis, oral bleeding, cough, wheezing, PND, orthopnea, palpitations, lower extremity swelling or redness, constipation, diarrhea, dysuria, hematuria, skin rash, headache, or seizures. He also denies any peripheral paresthesias.  MEDICAL HISTORY: Past Medical History  Diagnosis Date  . Graves disease   . Hypothyroidism, postradioiodine therapy   . History of shingles     RASH  02-03-2014  PER PCP RESOLVING  . Prostate cancer     DX  SEVERAL  YRS   AGO  WITH RADIATION THERAPY  . Cataract, secondary obscuring vision     LEFT  . Blindness of right eye   . Hypertension     NO MEDS ,  MONITORED BY PCP  . Arthritis   . 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 Cancer     pharyngeal ca  . Squamous cell carcinoma of head and neck 07/12/2014    INTERIM HISTORY: has Hypertension; Graves disease; Blind right eye; Dyslipidemia; Abnormal transaminases; Hypothyroid; Shingles rash; Squamous cell carcinoma of head and neck; and History of prostate cancer on his problem list.   Oncology History   Secondary and unspecified malignant neoplasm of lymph nodes of head, face, and neck, likely nasopharynx in origin   Primary site: Pharynx - Nasopharynx   Staging method: AJCC 7th Edition   Clinical: Stage II (T1, N1, M0)   Summary: Stage II (T1, N1, M0)       Squamous cell carcinoma of head and neck   05/05/2014 Imaging CT scan of the neck show enlarged left lymph node measure less than 6 cm.   05/25/2014 Pathology Results [id number] is positive for squamous cell carcinoma.   05/25/2014 Procedure Lymph node biopsy of the left neck came back squamous cell carcinoma.   06/20/2014 Imaging PET CT scan show no evidence of distant metastatic disease.   06/29/2014 Surgery Laryngoscopy and random biopsy of the nasopharynx was negative for malignancy.   07/27/2014 - 09/27/2014 Chemotherapy Cisplatin + 5FU (days 1-5) every 28 days 4 cycles   11/06/2014 -  Rad[facility] @[facility].      ALLERGIES:  has No Known Allergies.  MEDICATIONS: has a current medication list which includes the following prescription(s): hydrochlorothiazide, hydrocodone-acetaminophen, levothyroxine, lidocaine-prilocaine, metoclopramide, naproxen sodium, prochlorperazine, cisplatin, and dextrose 5 % SOLN 1,000 mL with fluorouracil 5 GM/100ML SOLN, and the following  Facility-Administered Medications: sodium chloride.  SURGICAL HISTORY:  Past Surgical History  Procedure Laterality Date  . Colonoscopy  10/20/2011    Procedure: COLONOSCOPY;  Surgeon: Daneil Dolin, MD;  Location: [facility];  Service: Endoscopy;  Laterality: N/A;  1:00 pm  . Cryoablation N/A 04/07/2014    Procedure:  SALVAGE CRYO ABLATION PROSTATE;  Surgeon: Lowella Bandy, MD;  Location: [facility];  Service: Urology;  Laterality: N/A;  . Laryngoscopy    . Lymph node biopsy      FAMILY HISTORY: family history includes Colon cancer in his father; Hypertension in his mother.  SOCIAL HISTORY:  reports that he quit smoking about 13 months ago. His smoking use included Cigarettes. He has a 40 pack-year smoking history. He has never used smokeless tobacco. He reports that he does not drink alcohol or use illicit drugs.  REVIEW OF SYSTEMS:  Other than that discussed above is noncontributory.  PHYSICAL EXAMINATION: ECOG PERFORMANCE STATUS: 1 - Symptomatic but completely ambulatory  Blood pressure 169/85, pulse 68, temperature 97.8 F (36.6 C), temperature source Axillary, resp. rate 20, weight 139 lb 14.4 oz (63.458 kg), SpO2 100 %.  GENERAL:alert, no distress and comfortable SKIN: skin color, texture, turgor are normal, no rashes or significant lesions EYES: PERLA; Conjunctiva are pink and non-injected, sclera clear. Right corneal [place]. SINUSES: No redness or tenderness over maxillary or ethmoid sinuses OROPHARYNX:no exudate, no erythema on lips, buccal mucosa, or tongue. No evidence of oropharyngeal mass. NECK: supple, thyroid normal size, non-tender, without nodularity. No masses. No palpable adenopathy. CHEST: Increased AP diameter with no breast masses. LifePort in place. LYMPH:  no palpable lymphadenopathy in the cervical, axillary or inguinal LUNGS: clear to auscultation and percussion with normal breathing effort HEART: regular rate & rhythm and no murmurs. ABDOMEN:abdomen soft, non-tender and normal bowel sounds MUSCULOSKELETAL:no cyanosis of digits and no clubbing. Range of motion normal.  NEURO: alert & oriented x 3 with fluent speech, no focal motor/sensory deficits   LABORATORY DATA: No visits with results within 30 Day(s) from this visit. Latest known visit with results  is:  Hospital Outpatient Visit on 10/18/2014  Component Date Value Ref Range Status  . Glucose-Capillary 10/18/2014 91  70 - 99 mg/dL Final    PATHOLOGY: Squamous cell carcinoma  Urinalysis No results found for: COLORURINE, APPEARANCEUR, LABSPEC, PHURINE, GLUCOSEU, HGBUR, BILIRUBINUR, KETONESUR, PROTEINUR, UROBILINOGEN, NITRITE, LEUKOCYTESUR  RADIOGRAPHIC STUDIES: No results found. ) Skull Base To Thigh   Status: Edited Result - FINAL       PACS Images     Show images for NM PET Image Restag (PS) Skull Base To Thigh     Addendum     Carron Curie, MD  Wed Oct 18, 2014 2:40:05 PM EDT     ADDENDUM REPORT: 10/18/2014 14:37  ADDENDUM: The original report was by Dr. Van Clines. The following addendum is by Dr. Van Clines:  They should be a fourth impression as follows:  4. Mildly increased left adrenal activity, with fullness of the adrenal gland but without a well-defined mass. The overall adrenal contour is not changed from 12/27/2013. Overall adrenal activity is not dissimilar to the prior PET-CT, and as we maintains surveillance over the pulmonary nodule and hypermetabolic but not enlarged pelvic lymph nodes, the left adrenal gland will also merit observation given the otherwise unexplained mildly increased metabolic activity.   Electronically Signed  By: Sherryl Barters M.D.  On: 10/18/2014 14:37      Study Result  CLINICAL DATA: Subsequent treatment strategy for metastatic squamous cell carcinoma, or pharyngeal primary. Head and neck cancer.  EXAM: NUCLEAR MEDICINE PET SKULL BASE TO THIGH  TECHNIQUE: 6.3 mCi F-18 FDG was injected intravenously. Full-ring PET imaging was performed from the skull base to thigh after the radiotracer. CT data was obtained and used for attenuation correction and anatomic localization.  FASTING BLOOD GLUCOSE: Value: 91 mg/dl  COMPARISON: Multiple exams, including 09/18/2014  and 06/20/2014  FINDINGS: NECK  Salivary and muscular activity in the neck noted.  That the large necrotic left neck mass below the sternocleidomastoid is no longer present. In the area in which a 8 mm vague soft tissue density deep to the sternocleidomastoid is measured on the prior CT scan from 1 month ago, the local maximum standard uptake value is approximately 2.3 (previously 8.5). No new adenopathy in the neck.  CHEST  Emphysema. 6 by 5 mm right middle lobe pulmonary nodule, no change from 12/27/2013, not discernibly hypermetabolic but below sensitive PET-CT size thresholds.  ABDOMEN/PELVIS  Faintly increased left adrenal gland activity without a discernible mass, maximum standard uptake value 3.2.  A small left inguinal lymph node measuring 0.9 cm in short axis on image 192 of series 4 has a maximum standard uptake value of 5.4 (formerly 4.2). A right perirectal lymph node adjacent to the piriformis muscle measuring 0.6 cm in short axis on image 183 of series 4 has a maximum standard uptake value of 3.1 (formerly 2.0).  Otherwise stable/negative.  SKELETON  No focal hypermetabolic activity to suggest skeletal metastasis.  IMPRESSION: 1. Indistinct soft tissue density below the left sternocleidomastoid in the location of the previous large left necrotic lymph node/mass. Maximum standard uptake value in this region is 2.3, similar to surrounding soft tissues, previously 8.5 along the margins of the necrotic node. 2. Mildly increased left inguinal nodal activity although the lymph node is not enlarged in size. There is also faintly increased activity in the right perirectal lymph node. These are of uncertain significance, without a regional pelvic mass. It would be unlikely for a head and neck primary to metastasize to the lower pelvis. Accordingly these are most likely reactive but likely warrant observation. 3. Emphysema. Stable 6 by 5 mm right  middle lobe pulmonary nodule, unchanged from 12/27/2013, not discernibly hypermetabolic but below sensitive PET-CT size thresholds. The 10 months of stability is reassuring, but this also warrants continued observation.  Electronically Signed: By: Sherryl Barters M.D. On: 10/18/2014 14    ASSESSMENT:  1. Locally advanced squamous cell carcinoma of head and neck, HPV N/A. No apparent primary site. Systemic chemotherapy consisting of Cisplatin and 5FU (days 1-5) every 21 days starting on 07/27/2014.  2. COPD.  3. Hypothyroidism  4. Right eye corneal opacity with blindness.  5. History of Graves' disease  6. Hypertension, controlled.   PLAN:  #1. Complete radiotherapy out to 33 treatments. #2. Follow-up in 6 weeks with CBC, chem profile, LDH.   All questions were answered. The patient knows to call the clinic with any problems, questions or concerns. We can certainly see the patient much sooner if necessary.   I spent 25 minutes counseling the patient face to face. The total time spent in the appointment was 30 minutes.    Doroteo Bradford, MD 11/27/2014 2:53 PM  DISCLAIMER:  This note was dictated with voice recognition software.  Similar sounding words can inadvertently be transcribed inaccurately and may not be corrected upon review.

## 2014-11-28 DIAGNOSIS — Z923 Personal history of irradiation: Secondary | ICD-10-CM

## 2014-11-28 HISTORY — DX: Personal history of irradiation: Z92.3

## 2015-01-04 ENCOUNTER — Ambulatory Visit: Payer: Medicare HMO | Admitting: *Deleted

## 2015-01-04 ENCOUNTER — Ambulatory Visit (INDEPENDENT_AMBULATORY_CARE_PROVIDER_SITE_OTHER): Payer: Medicare HMO | Admitting: *Deleted

## 2015-01-04 ENCOUNTER — Telehealth: Payer: Self-pay | Admitting: Family Medicine

## 2015-01-04 DIAGNOSIS — Z23 Encounter for immunization: Secondary | ICD-10-CM

## 2015-01-08 ENCOUNTER — Encounter (HOSPITAL_BASED_OUTPATIENT_CLINIC_OR_DEPARTMENT_OTHER): Payer: Commercial Managed Care - HMO | Admitting: Hematology & Oncology

## 2015-01-08 ENCOUNTER — Encounter (HOSPITAL_COMMUNITY): Payer: Commercial Managed Care - HMO | Attending: Hematology

## 2015-01-08 ENCOUNTER — Ambulatory Visit (HOSPITAL_COMMUNITY): Payer: Medicare HMO

## 2015-01-08 ENCOUNTER — Encounter (HOSPITAL_COMMUNITY): Payer: Self-pay | Admitting: Hematology & Oncology

## 2015-01-08 DIAGNOSIS — Z8546 Personal history of malignant neoplasm of prostate: Secondary | ICD-10-CM | POA: Insufficient documentation

## 2015-01-08 DIAGNOSIS — C770 Secondary and unspecified malignant neoplasm of lymph nodes of head, face and neck: Secondary | ICD-10-CM | POA: Diagnosis not present

## 2015-01-08 DIAGNOSIS — C103 Malignant neoplasm of posterior wall of oropharynx: Secondary | ICD-10-CM | POA: Insufficient documentation

## 2015-01-08 DIAGNOSIS — C801 Malignant (primary) neoplasm, unspecified: Secondary | ICD-10-CM | POA: Diagnosis not present

## 2015-01-08 DIAGNOSIS — R590 Localized enlarged lymph nodes: Secondary | ICD-10-CM | POA: Insufficient documentation

## 2015-01-08 DIAGNOSIS — R911 Solitary pulmonary nodule: Secondary | ICD-10-CM | POA: Diagnosis not present

## 2015-01-08 DIAGNOSIS — C760 Malignant neoplasm of head, face and neck: Secondary | ICD-10-CM | POA: Insufficient documentation

## 2015-01-08 LAB — CBC WITH DIFFERENTIAL/PLATELET
Basophils Absolute: 0 10*3/uL (ref 0.0–0.1)
Basophils Relative: 1 % (ref 0–1)
EOS ABS: 0 10*3/uL (ref 0.0–0.7)
EOS PCT: 2 % (ref 0–5)
HEMATOCRIT: 37.1 % — AB (ref 39.0–52.0)
Hemoglobin: 12.1 g/dL — ABNORMAL LOW (ref 13.0–17.0)
Lymphocytes Relative: 24 % (ref 12–46)
Lymphs Abs: 0.6 10*3/uL — ABNORMAL LOW (ref 0.7–4.0)
MCH: 29 pg (ref 26.0–34.0)
MCHC: 32.6 g/dL (ref 30.0–36.0)
MCV: 89 fL (ref 78.0–100.0)
Monocytes Absolute: 0.3 10*3/uL (ref 0.1–1.0)
Monocytes Relative: 13 % — ABNORMAL HIGH (ref 3–12)
NEUTROS ABS: 1.4 10*3/uL — AB (ref 1.7–7.7)
NEUTROS PCT: 60 % (ref 43–77)
PLATELETS: 216 10*3/uL (ref 150–400)
RBC: 4.17 MIL/uL — AB (ref 4.22–5.81)
RDW: 13.5 % (ref 11.5–15.5)
WBC: 2.3 10*3/uL — ABNORMAL LOW (ref 4.0–10.5)

## 2015-01-08 LAB — COMPREHENSIVE METABOLIC PANEL
ALT: 10 U/L (ref 0–53)
ANION GAP: 10 (ref 5–15)
AST: 21 U/L (ref 0–37)
Albumin: 4.1 g/dL (ref 3.5–5.2)
Alkaline Phosphatase: 71 U/L (ref 39–117)
BUN: 21 mg/dL (ref 6–23)
CO2: 27 mmol/L (ref 19–32)
CREATININE: 0.85 mg/dL (ref 0.50–1.35)
Calcium: 9.4 mg/dL (ref 8.4–10.5)
Chloride: 101 mEq/L (ref 96–112)
GFR calc non Af Amer: >90 mL/min (ref >90)
Glucose, Bld: 92 mg/dL (ref 70–99)
Potassium: 3.8 mmol/L (ref 3.5–5.1)
SODIUM: 138 mmol/L (ref 135–145)
Total Bilirubin: 0.6 mg/dL (ref 0.3–1.2)
Total Protein: 7.4 g/dL (ref 6.0–8.3)

## 2015-01-08 LAB — LACTATE DEHYDROGENASE: LDH: 252 U/L — AB (ref 94–250)

## 2015-01-08 LAB — MAGNESIUM: MAGNESIUM: 2.4 mg/dL (ref 1.5–2.5)

## 2015-01-08 MED ORDER — HEPARIN SOD (PORK) LOCK FLUSH 100 UNIT/ML IV SOLN
INTRAVENOUS | Status: AC
  Filled 2015-01-08: qty 5

## 2015-01-08 MED ORDER — SODIUM CHLORIDE 0.9 % IJ SOLN
10.0000 mL | INTRAMUSCULAR | Status: DC | PRN
  Administered 2015-01-08: 10 mL via INTRAVENOUS
  Filled 2015-01-08: qty 10

## 2015-01-08 MED ORDER — HEPARIN SOD (PORK) LOCK FLUSH 100 UNIT/ML IV SOLN
500.0000 [IU] | Freq: Once | INTRAVENOUS | Status: AC
  Administered 2015-01-08: 500 [IU] via INTRAVENOUS

## 2015-01-08 NOTE — Patient Instructions (Signed)
[place] at [facility]  Discharge Instructions:  Please call if you need anything prior to your follow-up Make sure you keep your appointment with Dr. Isidore Moos We will see you back in 8 weeks, sooner if needed. _______________________________________________________________  Thank you for choosing [place] at [facility] to provide your oncology and hematology care.  To afford each patient quality time with our providers, please arrive at least 15 minutes before your scheduled appointment.  You need to re-schedule your appointment if you arrive 10 or more minutes late.  We strive to give you quality time with our providers, and arriving late affects you and other patients whose appointments are after yours.  Also, if you no show three or more times for appointments you may be dismissed from the clinic.  Again, thank you for choosing [facility] [place] at [facility] hope is that these requests will allow you access to exceptional care and in a timely manner. _______________________________________________________________  If you have questions after your visit, please contact our office at [phone] between the hours of 8:30 a.m. and 5:00 p.m. Voicemails left after 4:30 p.m. will not be returned until the following business day. _______________________________________________________________  For prescription refill requests, have your pharmacy contact our office. _______________________________________________________________  Recommendations made by the consultant and any test results will be sent to your referring physician. _______________________________________________________________

## 2015-01-08 NOTE — Progress Notes (Signed)
Brandon Hall, [place] [address]  Squamous cell carcinoma of the head and neck, stage IV (L neck) Cisplatin/5-FU and XRT, finished XRT 12/18/2014  Prostate Cancer, definitive XRT   CURRENT THERAPY: Observation  INTERVAL HISTORY: Brandon Hall 59 y.o. male returns for follow-up of head and neck cancer. He is doing remarkably well. Weight is down 20 lbs since he started therapy. He takes all his nutrition by mouth, he never had a feeding tube.  He walks fairly frequently.  He walks anywhere from 2 to 3 miles a day.  He loves to cook.  MEDICAL HISTORY: Past Medical History  Diagnosis Date  . Graves disease   . Hypothyroidism, postradioiodine therapy   . History of shingles     RASH  02-03-2014  PER PCP RESOLVING  . Prostate cancer     DX  SEVERAL  YRS   AGO  WITH RADIATION THERAPY  . Cataract, secondary obscuring vision     LEFT  . Blindness of right eye   . Hypertension     NO MEDS ,  MONITORED BY PCP  . Arthritis   . 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 Cancer     pharyngeal ca  . Squamous cell carcinoma of head and neck 07/12/2014    has Hypertension; Graves disease; Blind right eye; Dyslipidemia; Abnormal transaminases; Hypothyroid; Shingles rash; Squamous cell carcinoma of head and neck; and History of prostate cancer on his problem list.    Oncology History   Secondary and unspecified malignant neoplasm of lymph nodes of head, face, and neck, likely nasopharynx in origin   Primary site: Pharynx - Nasopharynx   Staging method: AJCC 7th Edition   Clinical: Stage II (T1, N1, M0)   Summary: Stage II (T1, N1, M0)       Squamous cell carcinoma of head and neck   05/05/2014 Imaging CT scan of the neck show enlarged left lymph node measure less than 6 cm.   05/25/2014 Pathology Results [phone] is positive for squamous cell carcinoma.   05/25/2014 Procedure Lymph node biopsy of the left neck came back squamous cell carcinoma.   06/20/2014 Imaging PET CT scan show no evidence of distant metastatic disease.   06/29/2014 Surgery Laryngoscopy and random biopsy of the nasopharynx was negative for malignancy.   07/27/2014 - 09/27/2014 Chemotherapy Cisplatin + 5FU (days 1-5) every 28 days 4 cycles   11/06/2014 -  Radiation Therapy @[facility].     has No Known Allergies.  We administered heparin lock flush and sodium chloride.  SURGICAL HISTORY: Past Surgical History  Procedure Laterality Date  . Colonoscopy  10/20/2011    Procedure: COLONOSCOPY;  Surgeon: Daneil Dolin, MD;  [facility];  Service: Endoscopy;  Laterality: N/A;  1:00 pm  . Cryoablation N/A 04/07/2014    Procedure: SALVAGE CRYO ABLATION PROSTATE;  Surgeon: Lowella Bandy, MD;  Location: [facility];  Service: Urology;  Laterality: N/A;  . Laryngoscopy    . Lymph node biopsy      SOCIAL HISTORY: History   Social History  . Marital Status: Married    Spouse Name: N/A    Number of Children: N/A  . Years of Education: N/A   Occupational History  . Not on file.   Social History Main Topics  . Smoking status: Former Smoker -- 1.00 packs/day for 40 years    Types: Cigarettes    Quit date: 09/29/2013  . Smokeless  tobacco: Never Used  . Alcohol Use: No  . Drug Use: No  . Sexual Activity: Not on file   Other Topics Concern  . Not on file   Social History Narrative    FAMILY HISTORY: Family History  Problem Relation Age of Onset  . Colon cancer Father   . Hypertension Mother     Review of Systems  Constitutional: Negative for fever, chills, weight loss and malaise/fatigue.  HENT: Negative for congestion, hearing loss, nosebleeds, sore throat and tinnitus.   Eyes: Negative for blurred vision, double vision, pain and discharge.  Respiratory: Negative for cough, hemoptysis, sputum production, shortness of breath and wheezing.   Cardiovascular: Negative for chest pain, palpitations, claudication, leg swelling and  PND.  Gastrointestinal: Negative for heartburn, nausea, vomiting, abdominal pain, diarrhea, constipation, blood in stool and melena.  Genitourinary: Negative for dysuria, urgency, frequency and hematuria.  Musculoskeletal: Negative for myalgias, joint pain and falls.  Skin: Negative for itching and rash.  Neurological: Negative for dizziness, tingling, tremors, sensory change, speech change, focal weakness, seizures, loss of consciousness, weakness and headaches.  Endo/Heme/Allergies: Does not bruise/bleed easily.  Psychiatric/Behavioral: Negative for depression, suicidal ideas, memory loss and substance abuse. The patient is not nervous/anxious and does not have insomnia.     PHYSICAL EXAMINATION  ECOG PERFORMANCE STATUS: 0 - Asymptomatic  Filed Vitals:   01/08/15 1324  BP: 135/84  Pulse: 77  Temp: 97.4 F (36.3 C)  Resp: 18    Physical Exam  Constitutional: He is oriented to person, place, and time.  THIN  HENT:  Head: Normocephalic and atraumatic.  Nose: Nose normal.  Mouth/Throat: Oropharynx is clear and moist. No oropharyngeal exudate.  Minor XRT changs to the R neck, mild oropharyngeal erythema  Eyes: Conjunctivae and EOM are normal. Pupils are equal, round, and reactive to light. Right eye exhibits no discharge. Left eye exhibits no discharge. No scleral icterus.  R eye cataract, severe  Neck: Normal range of motion. Neck supple. No tracheal deviation present. No thyromegaly present.  Cardiovascular: Normal rate, regular rhythm and normal heart sounds.  Exam reveals no gallop and no friction rub.   No murmur heard. Pulmonary/Chest: Effort normal and breath sounds normal. He has no wheezes. He has no rales.  R port a cath  Abdominal: Soft. Bowel sounds are normal. He exhibits no distension and no mass. There is no tenderness. There is no rebound and no guarding.  Musculoskeletal: Normal range of motion. He exhibits no edema.  Lymphadenopathy:    He has no cervical  adenopathy.  Neurological: He is alert and oriented to person, place, and time. He has normal reflexes. No cranial nerve deficit. Gait normal. Coordination normal.  Skin: Skin is warm and dry. No rash noted.  Psychiatric: Mood, memory, affect and judgment normal.  Nursing note and vitals reviewed.   LABORATORY DATA:  CBC    Component Value Date/Time   WBC 2.3* 01/08/2015 1334   WBC 4.9 05/01/2014 1019   RBC 4.17* 01/08/2015 1334   RBC 5.4 05/01/2014 1019   HGB 12.1* 01/08/2015 1334   HGB 14.1 05/01/2014 1019   HCT 37.1* 01/08/2015 1334   HCT 45.2 05/01/2014 1019   PLT 216 01/08/2015 1334   MCV 89.0 01/08/2015 1334   MCV 83.3 05/01/2014 1019   MCH 29.0 01/08/2015 1334   MCH 26.0* 05/01/2014 1019   MCHC 32.6 01/08/2015 1334   MCHC 31.2* 05/01/2014 1019   RDW 13.5 01/08/2015 1334   LYMPHSABS 0.6* 01/08/2015 1334  MONOABS 0.3 01/08/2015 1334   EOSABS 0.0 01/08/2015 1334   BASOSABS 0.0 01/08/2015 1334   CMP     Component Value Date/Time   NA 140 11/27/2014 1325   NA 134 10/06/2014 1025   K 3.6* 11/27/2014 1325   CL 99 11/27/2014 1325   CO2 27 11/27/2014 1325   GLUCOSE 112* 11/27/2014 1325   GLUCOSE 93 10/06/2014 1025   BUN 15 11/27/2014 1325   BUN 18 10/06/2014 1025   CREATININE 0.76 11/27/2014 1325   CREATININE 0.89 07/19/2014 1527   CALCIUM 9.6 11/27/2014 1325   PROT 7.4 11/27/2014 1325   PROT 7.4 10/06/2014 1025   ALBUMIN 4.0 11/27/2014 1325   AST 18 11/27/2014 1325   ALT 8 11/27/2014 1325   ALKPHOS 74 11/27/2014 1325   BILITOT 0.3 11/27/2014 1325   GFRNONAA >90 11/27/2014 1325   GFRAA >90 11/27/2014 1325        ASSESSMENT and THERAPY PLAN:    Squamous cell carcinoma of head and neck Pleasant 59 year old male with biopsy-proven squamous cell carcinoma of the head and neck. Presumed primary is in the oropharynx. Left neck biopsy revealed squamous cell carcinoma. He has completed chemotherapy and radiation. His last radiation treatment was on 1221. The  patient never had a feeding tube, he has lost a total of 20 pounds, but reports he eats without any difficulty. Denies any pain with swallowing and feels he is making a good recovery. He is without any other concerns today. He follows up with Dr. Isidore Moos at the end of this month and I have encouraged him to make sure you keep that appointment. We will call him later today if his any problems with his laboratory studies. Otherwise I will see him back in 2 months with labs, we will discuss ongoing surveillance at his next follow-up visit.    All questions were answered. The patient knows to call the clinic with any problems, questions or concerns. We can certainly see the patient much sooner if necessary.  Molli Hazard 01/08/2015

## 2015-01-08 NOTE — Assessment & Plan Note (Signed)
Pleasant 59 year old male with biopsy-proven squamous cell carcinoma of the head and neck. Presumed primary is in the oropharynx. Left neck biopsy revealed squamous cell carcinoma. He has completed chemotherapy and radiation. His last radiation treatment was on 1221. The patient never had a feeding tube, he has lost a total of 20 pounds, but reports he eats without any difficulty. Denies any pain with swallowing and feels he is making a good recovery. He is without any other concerns today. He follows up with Dr. Isidore Moos at the end of this month and I have encouraged him to make sure you keep that appointment. We will call him later today if his any problems with his laboratory studies. Otherwise I will see him back in 2 months with labs, we will discuss ongoing surveillance at his next follow-up visit.

## 2015-01-12 ENCOUNTER — Telehealth: Payer: Self-pay | Admitting: Nutrition

## 2015-01-12 NOTE — Telephone Encounter (Signed)
Patient identified to be at risk for malnutrition on the MST secondary weight loss and poor appetite. Contacted patient's caregiver who reports patient is increasing his oral intake. Patient is consuming boost 2-3 times daily. Patient reports taste is coming back. Educated caregiver to provide small frequent, high-calorie, high-protein foods. Recommended patient consume boost plus between meals. Caregiver declines written nutrition information at this time but will accept coupons for boost. I will mail coupons, and my contact information for questions or concerns. Teach back method used.  **Disclaimer: This note was dictated with voice recognition software. Similar sounding words can inadvertently be transcribed and this note may contain transcription errors which may not have been corrected upon publication of note.**

## 2015-01-23 ENCOUNTER — Ambulatory Visit: Payer: Commercial Managed Care - HMO | Admitting: Urology

## 2015-01-23 DIAGNOSIS — Z923 Personal history of irradiation: Secondary | ICD-10-CM | POA: Diagnosis not present

## 2015-01-23 DIAGNOSIS — C760 Malignant neoplasm of head, face and neck: Secondary | ICD-10-CM | POA: Diagnosis not present

## 2015-01-24 ENCOUNTER — Other Ambulatory Visit: Payer: Self-pay | Admitting: Radiation Oncology

## 2015-01-24 DIAGNOSIS — H2513 Age-related nuclear cataract, bilateral: Secondary | ICD-10-CM | POA: Diagnosis not present

## 2015-01-24 DIAGNOSIS — C760 Malignant neoplasm of head, face and neck: Secondary | ICD-10-CM

## 2015-02-07 ENCOUNTER — Ambulatory Visit (INDEPENDENT_AMBULATORY_CARE_PROVIDER_SITE_OTHER): Payer: Commercial Managed Care - HMO | Admitting: Family

## 2015-02-07 ENCOUNTER — Encounter: Payer: Self-pay | Admitting: Family

## 2015-02-07 VITALS — BP 110/69 | HR 60 | Temp 97.0°F | Ht 72.0 in | Wt 128.4 lb

## 2015-02-07 DIAGNOSIS — Z0000 Encounter for general adult medical examination without abnormal findings: Secondary | ICD-10-CM | POA: Diagnosis not present

## 2015-02-07 DIAGNOSIS — Z125 Encounter for screening for malignant neoplasm of prostate: Secondary | ICD-10-CM | POA: Diagnosis not present

## 2015-02-07 DIAGNOSIS — E785 Hyperlipidemia, unspecified: Secondary | ICD-10-CM | POA: Diagnosis not present

## 2015-02-07 DIAGNOSIS — I10 Essential (primary) hypertension: Secondary | ICD-10-CM

## 2015-02-07 DIAGNOSIS — E038 Other specified hypothyroidism: Secondary | ICD-10-CM | POA: Diagnosis not present

## 2015-02-07 DIAGNOSIS — Z1321 Encounter for screening for nutritional disorder: Secondary | ICD-10-CM | POA: Diagnosis not present

## 2015-02-07 NOTE — Patient Instructions (Signed)

## 2015-02-07 NOTE — Progress Notes (Signed)
Subjective:    Patient ID: Brandon Hall, male    DOB: Jun 14, 1956, 59 y.o.   MRN: [id number]  Hypertension This is a chronic problem. The current episode started more than 1 year ago. The problem has been resolved since onset. The problem is controlled. Pertinent negatives include no anxiety, headaches, palpitations, peripheral edema or shortness of breath. Risk factors for coronary artery disease include dyslipidemia and male gender. Past treatments include diuretics. The current treatment provides significant improvement. Hypertensive end-organ damage includes a thyroid problem. There is no history of kidney disease, CAD/MI, CVA or heart failure. There is no history of sleep apnea.  Thyroid Problem Presents for follow-up visit. Patient reports no constipation, depressed mood, diarrhea, dry skin, hair loss, palpitations or weight gain. The symptoms have been improving. Past treatments include levothyroxine. The treatment provided significant relief. There is no history of heart failure.      Review of Systems  Constitutional: Negative.  Negative for weight gain.  HENT: Negative.   Respiratory: Negative.  Negative for shortness of breath.   Cardiovascular: Negative.  Negative for palpitations.  Gastrointestinal: Negative.  Negative for diarrhea and constipation.  Endocrine: Negative.   Genitourinary: Negative.   Musculoskeletal: Negative.   Neurological: Negative.  Negative for headaches.  Hematological: Negative.   Psychiatric/Behavioral: Negative.   All other systems reviewed and are negative.      Objective:   Physical Exam  Constitutional: He is oriented to person, place, and time. He appears well-developed and well-nourished. No distress.  HENT:  Head: Normocephalic.  Right Ear: External ear normal.  Left Ear: External ear normal.  Nose: Nose normal.  Mouth/Throat: Oropharynx is clear and moist.  Eyes: Pupils are equal, round, and reactive to light. Right eye exhibits no  discharge. Left eye exhibits no discharge.  Neck: Normal range of motion. Neck supple. No thyromegaly present.  Cardiovascular: Normal rate, regular rhythm, normal heart sounds and intact distal pulses.   No murmur heard. Pulmonary/Chest: Effort normal and breath sounds normal. No respiratory distress. He has no wheezes.  Abdominal: Soft. Bowel sounds are normal. He exhibits no distension. There is no tenderness.  Musculoskeletal: Normal range of motion. He exhibits no edema or tenderness.  Neurological: He is alert and oriented to person, place, and time. He has normal reflexes. No cranial nerve deficit.  Skin: Skin is warm and dry. No rash noted. No erythema.  Psychiatric: He has a normal mood and affect. His behavior is normal. Judgment and thought content normal.  Vitals reviewed.  BP 144/80 mmHg  Pulse 63  Temp(Src) 97 F (36.1 C) (Oral)  Ht 6' (1.829 m)  Wt 128 lb 6.4 oz (58.242 kg)  BMI 17.41 kg/m2        Assessment & Plan:  1. Essential hypertension - CMP14+EGFR  2. Other specified hypothyroidism - CMP14+EGFR - Thyroid Panel With TSH  3. Prostate cancer screening - PSA, total and free  4. Encounter for vitamin deficiency screening - Vit D  25 hydroxy (rtn osteoporosis monitoring)  5. Hyperlipemia - Lipid panel  6. Annual physical exam - CMP14+EGFR - Lipid panel - PSA, total and free - Thyroid Panel With TSH - Vit D  25 hydroxy (rtn osteoporosis monitoring)   Continue all meds- keep all appts with Oncologists Labs pending Health Maintenance reviewed Diet and exercise encouraged RTO 6 months  Jannifer Rodney, FNP

## 2015-02-08 ENCOUNTER — Other Ambulatory Visit: Payer: Self-pay | Admitting: Family

## 2015-02-08 DIAGNOSIS — E559 Vitamin D deficiency, unspecified: Secondary | ICD-10-CM | POA: Insufficient documentation

## 2015-02-08 LAB — CMP14+EGFR
A/G RATIO: 1.9 (ref 1.1–2.5)
ALT: 9 IU/L (ref 0–44)
AST: 16 IU/L (ref 0–40)
Albumin: 4.7 g/dL (ref 3.5–5.5)
Alkaline Phosphatase: 65 IU/L (ref 39–117)
BUN/Creatinine Ratio: 19 (ref 9–20)
BUN: 15 mg/dL (ref 6–24)
Bilirubin Total: 0.3 mg/dL (ref 0.0–1.2)
CALCIUM: 10.2 mg/dL (ref 8.7–10.2)
CHLORIDE: 99 mmol/L (ref 97–108)
CO2: 26 mmol/L (ref 18–29)
Creatinine, Ser: 0.8 mg/dL (ref 0.76–1.27)
GFR calc Af Amer: 113 mL/min/{1.73_m2} (ref >59)
GFR calc non Af Amer: 98 mL/min/{1.73_m2} (ref >59)
GLUCOSE: 84 mg/dL (ref 65–99)
Globulin, Total: 2.5 g/dL (ref 1.5–4.5)
POTASSIUM: 4.5 mmol/L (ref 3.5–5.2)
Sodium: 141 mmol/L (ref 134–144)
Total Protein: 7.2 g/dL (ref 6.0–8.5)

## 2015-02-08 LAB — LIPID PANEL
CHOLESTEROL TOTAL: 221 mg/dL — AB (ref 100–199)
Chol/HDL Ratio: 3.2 ratio units (ref 0.0–5.0)
HDL: 70 mg/dL (ref >39)
LDL Calculated: 141 mg/dL — ABNORMAL HIGH (ref 0–99)
Triglycerides: 48 mg/dL (ref 0–149)
VLDL Cholesterol Cal: 10 mg/dL (ref 5–40)

## 2015-02-08 LAB — PSA, TOTAL AND FREE
PSA FREE: 0.07 ng/mL
PSA, Free Pct: 3.3 %
PSA: 2.1 ng/mL (ref 0.0–4.0)

## 2015-02-08 LAB — THYROID PANEL WITH TSH
Free Thyroxine Index: 2.3 (ref 1.2–4.9)
T3 Uptake Ratio: 21 % — ABNORMAL LOW (ref 24–39)
T4 TOTAL: 11 ug/dL (ref 4.5–12.0)
TSH: 1.57 u[IU]/mL (ref 0.450–4.500)

## 2015-02-08 LAB — VITAMIN D 25 HYDROXY (VIT D DEFICIENCY, FRACTURES): Vit D, 25-Hydroxy: 16.1 ng/mL — ABNORMAL LOW (ref 30.0–100.0)

## 2015-02-08 MED ORDER — VITAMIN D (ERGOCALCIFEROL) 1.25 MG (50000 UNIT) PO CAPS: 50000.0000 [IU] | ORAL_CAPSULE | ORAL | Status: DC

## 2015-02-08 MED ORDER — PRAVASTATIN SODIUM 40 MG PO TABS: 40.0000 mg | ORAL_TABLET | Freq: Every day | ORAL | Status: DC

## 2015-02-08 NOTE — Progress Notes (Signed)
Kidney and liver function stable Cholesterol elevated- Prescription sent to pharmacy  PSA WNL Thyroid WNL Vit D levels low-Prescription sent to pharmacy

## 2015-02-14 ENCOUNTER — Telehealth: Payer: Self-pay | Admitting: Family

## 2015-02-14 NOTE — Telephone Encounter (Signed)
Spoke with wife and reviewed all current meds and directions. Wife verbalized understanding

## 2015-02-27 ENCOUNTER — Ambulatory Visit (INDEPENDENT_AMBULATORY_CARE_PROVIDER_SITE_OTHER): Payer: Commercial Managed Care - HMO | Admitting: Urology

## 2015-02-27 DIAGNOSIS — R972 Elevated prostate specific antigen [PSA]: Secondary | ICD-10-CM | POA: Diagnosis not present

## 2015-02-27 DIAGNOSIS — C61 Malignant neoplasm of prostate: Secondary | ICD-10-CM

## 2015-03-09 ENCOUNTER — Encounter (HOSPITAL_COMMUNITY): Payer: Commercial Managed Care - HMO | Attending: Hematology | Admitting: Hematology & Oncology

## 2015-03-09 ENCOUNTER — Encounter (HOSPITAL_COMMUNITY): Payer: Self-pay | Admitting: Hematology & Oncology

## 2015-03-09 ENCOUNTER — Encounter (HOSPITAL_COMMUNITY): Payer: Commercial Managed Care - HMO

## 2015-03-09 VITALS — BP 113/60 | HR 73 | Temp 97.8°F | Resp 16 | Wt 131.2 lb

## 2015-03-09 DIAGNOSIS — C61 Malignant neoplasm of prostate: Secondary | ICD-10-CM | POA: Diagnosis not present

## 2015-03-09 DIAGNOSIS — C103 Malignant neoplasm of posterior wall of oropharynx: Secondary | ICD-10-CM | POA: Insufficient documentation

## 2015-03-09 DIAGNOSIS — C760 Malignant neoplasm of head, face and neck: Secondary | ICD-10-CM

## 2015-03-09 DIAGNOSIS — R911 Solitary pulmonary nodule: Secondary | ICD-10-CM | POA: Diagnosis not present

## 2015-03-09 DIAGNOSIS — Z8546 Personal history of malignant neoplasm of prostate: Secondary | ICD-10-CM | POA: Insufficient documentation

## 2015-03-09 DIAGNOSIS — R590 Localized enlarged lymph nodes: Secondary | ICD-10-CM | POA: Diagnosis not present

## 2015-03-09 DIAGNOSIS — C770 Secondary and unspecified malignant neoplasm of lymph nodes of head, face and neck: Secondary | ICD-10-CM | POA: Diagnosis not present

## 2015-03-09 DIAGNOSIS — I878 Other specified disorders of veins: Secondary | ICD-10-CM

## 2015-03-09 LAB — CBC WITH DIFFERENTIAL/PLATELET
BASOS ABS: 0 10*3/uL (ref 0.0–0.1)
Basophils Relative: 1 % (ref 0–1)
EOS PCT: 1 % (ref 0–5)
Eosinophils Absolute: 0 10*3/uL (ref 0.0–0.7)
HCT: 41 % (ref 39.0–52.0)
Hemoglobin: 13.1 g/dL (ref 13.0–17.0)
Lymphocytes Relative: 29 % (ref 12–46)
Lymphs Abs: 0.9 10*3/uL (ref 0.7–4.0)
MCH: 27.5 pg (ref 26.0–34.0)
MCHC: 32 g/dL (ref 30.0–36.0)
MCV: 86.1 fL (ref 78.0–100.0)
Monocytes Absolute: 0.3 10*3/uL (ref 0.1–1.0)
Monocytes Relative: 10 % (ref 3–12)
NEUTROS PCT: 59 % (ref 43–77)
Neutro Abs: 1.9 10*3/uL (ref 1.7–7.7)
PLATELETS: 156 10*3/uL (ref 150–400)
RBC: 4.76 MIL/uL (ref 4.22–5.81)
RDW: 15.3 % (ref 11.5–15.5)
WBC: 3.2 10*3/uL — ABNORMAL LOW (ref 4.0–10.5)

## 2015-03-09 MED ORDER — SODIUM CHLORIDE 0.9 % IJ SOLN
10.0000 mL | INTRAMUSCULAR | Status: DC | PRN
  Administered 2015-03-09: 10 mL via INTRAVENOUS
  Filled 2015-03-09: qty 10

## 2015-03-09 MED ORDER — HEPARIN SOD (PORK) LOCK FLUSH 100 UNIT/ML IV SOLN
500.0000 [IU] | Freq: Once | INTRAVENOUS | Status: AC
  Administered 2015-03-09: 500 [IU] via INTRAVENOUS
  Filled 2015-03-09: qty 5

## 2015-03-09 NOTE — Progress Notes (Signed)
Brandon Hall, [place] [address]  Squamous cell carcinoma of the head and neck, stage IV (L neck) Cisplatin/5-FU and XRT, finished XRT 12/18/2014  Prostate Cancer, definitive XRT   CURRENT THERAPY: Observation  INTERVAL HISTORY: Brandon Hall 59 y.o. male returns for follow-up of head and neck cancer. He is doing remarkably well. Weight is down 17 lbs since he started therapy. He has gained 3 pounds since his last visit. He takes all his nutrition by mouth, he never had a feeding tube.  He walks fairly frequently.  He walks anywhere from 2 to 3 miles a day.  He loves to cook. He is essentially unchanged from his last visit. His wife notes that he seems to stay in the kitchen cooking more so than prior to his cancer diagnosis.  MEDICAL HISTORY: Past Medical History  Diagnosis Date  . Graves disease   . Hypothyroidism, postradioiodine therapy   . History of shingles     RASH  02-03-2014  PER PCP RESOLVING  . Prostate cancer     DX  SEVERAL  YRS   AGO  WITH RADIATION THERAPY  . Cataract, secondary obscuring vision     LEFT  . Blindness of right eye   . Hypertension     NO MEDS ,  MONITORED BY PCP  . Arthritis   . 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 Cancer     pharyngeal ca  . Squamous cell carcinoma of head and neck 07/12/2014    has Hypertension; Graves disease; Blind right eye; Dyslipidemia; Abnormal transaminases; Hypothyroid; Shingles rash; Squamous cell carcinoma of head and neck; History of prostate cancer; and Vitamin D deficiency on his problem list.    Oncology History   Secondary and unspecified malignant neoplasm of lymph nodes of head, face, and neck, likely nasopharynx in origin   Primary site: Pharynx - Nasopharynx   Staging method: AJCC 7th Edition   Clinical: Stage II (T1, N1, M0)   Summary: Stage II (T1, N1, M0)       Squamous cell carcinoma of head and neck   05/05/2014 Imaging CT scan of the neck show  enlarged left lymph node measure less than 6 cm.   05/25/2014 Pathology Results [phone] is positive for squamous cell carcinoma.   05/25/2014 Procedure Lymph node biopsy of the left neck came back squamous cell carcinoma.   06/20/2014 Imaging PET CT scan show no evidence of distant metastatic disease.   06/29/2014 Surgery Laryngoscopy and random biopsy of the nasopharynx was negative for malignancy.   07/27/2014 - 09/27/2014 Chemotherapy Cisplatin + 5FU (days 1-5) every 28 days 4 cycles   11/06/2014 - 12/25/2014 Radiation Therapy In [place] with Dr. Isidore Hall     has No Known Allergies.  [name] does not currently have medications on file.  SURGICAL HISTORY: Past Surgical History  Procedure Laterality Date  . Colonoscopy  10/20/2011    Procedure: COLONOSCOPY;  Surgeon: Brandon Dolin, MD;  [facility];  Service: Endoscopy;  Laterality: N/A;  1:00 pm  . Cryoablation N/A 04/07/2014    Procedure: SALVAGE CRYO ABLATION PROSTATE;  Surgeon: Brandon Bandy, MD;  Location: [facility];  Service: Urology;  Laterality: N/A;  . Laryngoscopy    . Lymph node biopsy      SOCIAL HISTORY: History   Social History  . Marital Status: Married    Spouse Name: N/A  . Number of Children: N/A  .  Years of Education: N/A   Occupational History  . Not on file.   Social History Main Topics  . Smoking status: Former Smoker -- 1.00 packs/day for 40 years    Types: Cigarettes    Quit date: 09/29/2013  . Smokeless tobacco: Never Used  . Alcohol Use: No  . Drug Use: No  . Sexual Activity: Not on file   Other Topics Concern  . Not on file   Social History Narrative    FAMILY HISTORY: Family History  Problem Relation Age of Onset  . Colon cancer Father   . Hypertension Mother     Review of Systems  Constitutional: Negative for fever, chills, weight loss and malaise/fatigue.  HENT: Negative for congestion, hearing loss, nosebleeds, sore throat and tinnitus.   Eyes: Negative for  blurred vision, double vision, pain and discharge.  Respiratory: Negative for cough, hemoptysis, sputum production, shortness of breath and wheezing.   Cardiovascular: Negative for chest pain, palpitations, claudication, leg swelling and PND.  Gastrointestinal: Negative for heartburn, nausea, vomiting, abdominal pain, diarrhea, constipation, blood in stool and melena.  Genitourinary: Negative for dysuria, urgency, frequency and hematuria.  Musculoskeletal: Negative for myalgias, joint pain and falls.  Skin: Negative for itching and rash.  Neurological: Negative for dizziness, tingling, tremors, sensory change, speech change, focal weakness, seizures, loss of consciousness, weakness and headaches.  Endo/Heme/Allergies: Does not bruise/bleed easily.  Psychiatric/Behavioral: Negative for depression, suicidal ideas, memory loss and substance abuse. The patient is not nervous/anxious and does not have insomnia.     PHYSICAL EXAMINATION  ECOG PERFORMANCE STATUS: 0 - Asymptomatic  Filed Vitals:   03/09/15 1324  BP: 113/60  Pulse: 73  Temp: 97.8 F (36.6 C)  Resp: 16    Physical Exam  Constitutional: He is oriented to person, place, and time.  THIN  HENT:  Head: Normocephalic and atraumatic.  Nose: Nose normal.  Mouth/Throat: Oropharynx is clear and moist. No oropharyngeal exudate.  Minor XRT changs to the R neck, mild oropharyngeal erythema  Eyes: Conjunctivae and EOM are normal. Pupils are equal, round, and reactive to light. Right eye exhibits no discharge. Left eye exhibits no discharge. No scleral icterus.  R eye cataract, severe  Neck: Normal range of motion. Neck supple. No tracheal deviation present. No thyromegaly present.  Cardiovascular: Normal rate, regular rhythm and normal heart sounds.  Exam reveals no gallop and no friction rub.   No murmur heard. Pulmonary/Chest: Effort normal and breath sounds normal. He has no wheezes. He has no rales.  R port a cath  Abdominal:  Soft. Bowel sounds are normal. He exhibits no distension and no mass. There is no tenderness. There is no rebound and no guarding.  Musculoskeletal: Normal range of motion. He exhibits no edema.  Lymphadenopathy:    He has no cervical adenopathy.  Neurological: He is alert and oriented to person, place, and time. He has normal reflexes. No cranial nerve deficit. Gait normal. Coordination normal.  Skin: Skin is warm and dry. No rash noted.  Psychiatric: Mood, memory, affect and judgment normal.  Nursing note and vitals reviewed.   LABORATORY DATA:  CBC    Component Value Date/Time   WBC 2.3* 01/08/2015 1334   WBC 4.9 05/01/2014 1019   RBC 4.17* 01/08/2015 1334   RBC 5.4 05/01/2014 1019   HGB 12.1* 01/08/2015 1334   HGB 14.1 05/01/2014 1019   HCT 37.1* 01/08/2015 1334   HCT 45.2 05/01/2014 1019   PLT 216 01/08/2015 1334   MCV 89.0  01/08/2015 1334   MCV 83.3 05/01/2014 1019   MCH 29.0 01/08/2015 1334   MCH 26.0* 05/01/2014 1019   MCHC 32.6 01/08/2015 1334   MCHC 31.2* 05/01/2014 1019   RDW 13.5 01/08/2015 1334   LYMPHSABS 0.6* 01/08/2015 1334   MONOABS 0.3 01/08/2015 1334   EOSABS 0.0 01/08/2015 1334   BASOSABS 0.0 01/08/2015 1334   CMP     Component Value Date/Time   NA 141 02/07/2015 1043   NA 138 01/08/2015 1334   K 4.5 02/07/2015 1043   CL 99 02/07/2015 1043   CO2 26 02/07/2015 1043   GLUCOSE 84 02/07/2015 1043   GLUCOSE 92 01/08/2015 1334   BUN 15 02/07/2015 1043   BUN 21 01/08/2015 1334   CREATININE 0.80 02/07/2015 1043   CREATININE 0.89 07/19/2014 1527   CALCIUM 10.2 02/07/2015 1043   PROT 7.2 02/07/2015 1043   PROT 7.4 01/08/2015 1334   ALBUMIN 4.1 01/08/2015 1334   AST 16 02/07/2015 1043   ALT 9 02/07/2015 1043   ALKPHOS 65 02/07/2015 1043   BILITOT 0.3 02/07/2015 1043   BILITOT 0.6 01/08/2015 1334   GFRNONAA 98 02/07/2015 1043   GFRAA 113 02/07/2015 1043      ASSESSMENT and THERAPY PLAN:   Squamous cell carcinoma of the head and neck, stage IV  (L neck) Cisplatin/5-FU and XRT, finished XRT 12/18/2014  Prostate Cancer, definitive XRT  59 year old male with squamous cell carcinoma of the head and neck. He finished definitive therapy in December of last year. He is scheduled for a PET scan on April 18. He is doing well. Weight is up several pounds since his last visit. I recommended ongoing observation. We will see him back in several months, in regards to his prostate cancer he is followed by urology. His last PSA was up and I have encouraged him to continue to follow with urology.   All questions were answered. The patient knows to call the clinic with any problems, questions or concerns. We can certainly see the patient much sooner if necessary.  Molli Hazard 03/09/2015

## 2015-03-09 NOTE — Progress Notes (Signed)
Brandon Hall presented for [employer]. Labs per MD order drawn via Portacath located in the right chest wall accessed with  H 20 needle. Good blood return present. Procedure without incident.  Portacath flushed with 71ml NS and 500U/35ml Heparin per protocol and needle removed intact. Patient tolerated procedure well.

## 2015-03-09 NOTE — Patient Instructions (Signed)
[place] at [facility] Discharge Instructions  RECOMMENDATIONS MADE BY THE CONSULTANT AND ANY TEST RESULTS WILL BE SENT TO YOUR REFERRING PHYSICIAN.  Exam and discussion by Dr. Whitney Muse.  You seem to be doing ok. Report weight loss or other concerns.  Will check some blood work today and if any concerns we will call you.  Port flushes every 6 weeks and office visit in 3 months.  Thank you for choosing [place] at [facility] to provide your oncology and hematology care.  To afford each patient quality time with our provider, please arrive at least 15 minutes before your scheduled appointment time.    You need to re-schedule your appointment should you arrive 10 or more minutes late.  We strive to give you quality time with our providers, and arriving late affects you and other patients whose appointments are after yours.  Also, if you no show three or more times for appointments you may be dismissed from the clinic at the providers discretion.     Again, thank you for choosing [facility].  Our hope is that these requests will decrease the amount of time that you wait before being seen by our physicians.       _____________________________________________________________  Should you have questions after your visit to [facility], please contact our office at [phone] between the hours of 8:30 a.m. and 4:30 p.m.  Voicemails left after 4:30 p.m. will not be returned until the following business day.  For prescription refill requests, have your pharmacy contact our office.

## 2015-04-02 ENCOUNTER — Telehealth: Payer: Self-pay | Admitting: Family Medicine

## 2015-04-02 NOTE — Telephone Encounter (Signed)
Stp's EC [name] and advised his vitamin d was sent over to [facility] 90 day supply with 3 refills. Advised they just need to call [facility] and get a refill. Pt voiced understanding.

## 2015-04-16 ENCOUNTER — Ambulatory Visit (HOSPITAL_COMMUNITY): Admission: RE | Admit: 2015-04-16 | Payer: Commercial Managed Care - HMO | Source: Ambulatory Visit

## 2015-04-24 ENCOUNTER — Encounter (HOSPITAL_COMMUNITY): Payer: Self-pay

## 2015-04-24 ENCOUNTER — Encounter (HOSPITAL_COMMUNITY): Payer: Commercial Managed Care - HMO | Attending: Hematology

## 2015-04-24 DIAGNOSIS — R590 Localized enlarged lymph nodes: Secondary | ICD-10-CM | POA: Insufficient documentation

## 2015-04-24 DIAGNOSIS — Z8546 Personal history of malignant neoplasm of prostate: Secondary | ICD-10-CM | POA: Insufficient documentation

## 2015-04-24 DIAGNOSIS — C760 Malignant neoplasm of head, face and neck: Secondary | ICD-10-CM | POA: Diagnosis not present

## 2015-04-24 DIAGNOSIS — C103 Malignant neoplasm of posterior wall of oropharynx: Secondary | ICD-10-CM | POA: Insufficient documentation

## 2015-04-24 DIAGNOSIS — R911 Solitary pulmonary nodule: Secondary | ICD-10-CM | POA: Diagnosis not present

## 2015-04-24 DIAGNOSIS — C770 Secondary and unspecified malignant neoplasm of lymph nodes of head, face and neck: Secondary | ICD-10-CM | POA: Diagnosis not present

## 2015-04-24 DIAGNOSIS — C61 Malignant neoplasm of prostate: Secondary | ICD-10-CM

## 2015-04-24 LAB — COMPREHENSIVE METABOLIC PANEL
ALK PHOS: 46 U/L (ref 39–117)
ALT: 11 U/L (ref 0–53)
AST: 19 U/L (ref 0–37)
Albumin: 3.8 g/dL (ref 3.5–5.2)
Anion gap: 7 (ref 5–15)
BILIRUBIN TOTAL: 0.4 mg/dL (ref 0.3–1.2)
BUN: 19 mg/dL (ref 6–23)
CHLORIDE: 102 mmol/L (ref 96–112)
CO2: 28 mmol/L (ref 19–32)
Calcium: 9.4 mg/dL (ref 8.4–10.5)
Creatinine, Ser: 0.79 mg/dL (ref 0.50–1.35)
GFR calc non Af Amer: >90 mL/min (ref >90)
GLUCOSE: 138 mg/dL — AB (ref 70–99)
Potassium: 3.7 mmol/L (ref 3.5–5.1)
Sodium: 137 mmol/L (ref 135–145)
TOTAL PROTEIN: 6.6 g/dL (ref 6.0–8.3)

## 2015-04-24 LAB — CBC WITH DIFFERENTIAL/PLATELET
Basophils Absolute: 0 10*3/uL (ref 0.0–0.1)
Basophils Relative: 1 % (ref 0–1)
EOS ABS: 0 10*3/uL (ref 0.0–0.7)
Eosinophils Relative: 1 % (ref 0–5)
HEMATOCRIT: 38.7 % — AB (ref 39.0–52.0)
Hemoglobin: 12.3 g/dL — ABNORMAL LOW (ref 13.0–17.0)
Lymphocytes Relative: 29 % (ref 12–46)
Lymphs Abs: 0.9 10*3/uL (ref 0.7–4.0)
MCH: 27.1 pg (ref 26.0–34.0)
MCHC: 31.8 g/dL (ref 30.0–36.0)
MCV: 85.2 fL (ref 78.0–100.0)
Monocytes Absolute: 0.2 10*3/uL (ref 0.1–1.0)
Monocytes Relative: 8 % (ref 3–12)
Neutro Abs: 1.9 10*3/uL (ref 1.7–7.7)
Neutrophils Relative %: 61 % (ref 43–77)
PLATELETS: 177 10*3/uL (ref 150–400)
RBC: 4.54 MIL/uL (ref 4.22–5.81)
RDW: 15.2 % (ref 11.5–15.5)
WBC: 3.1 10*3/uL — AB (ref 4.0–10.5)

## 2015-04-24 MED ORDER — SODIUM CHLORIDE 0.9 % IJ SOLN
10.0000 mL | INTRAMUSCULAR | Status: DC | PRN
  Administered 2015-04-24: 10 mL via INTRAVENOUS
  Filled 2015-04-24: qty 10

## 2015-04-24 MED ORDER — HEPARIN SOD (PORK) LOCK FLUSH 100 UNIT/ML IV SOLN
500.0000 [IU] | Freq: Once | INTRAVENOUS | Status: AC
  Administered 2015-04-24: 500 [IU] via INTRAVENOUS

## 2015-04-24 NOTE — Progress Notes (Signed)
Daleen Bo Ferron presented for Portacath access and flush.  Proper placement of portacath confirmed by CXR.  Portacath located right chest wall accessed with  H 20 needle.  Good blood return present. Portacath flushed with 75ml NS and 500U/4ml Heparin and needle removed intact.  Procedure tolerated well and without incident.

## 2015-04-24 NOTE — Patient Instructions (Signed)
[place] at [facility]  Discharge Instructions:  You had your port flushed today and lab work drawn Please follow up as scheduled Call the clinic if you have any questions or concerns _______________________________________________________________  Thank you for choosing [place] at [facility] to provide your oncology and hematology care.  To afford each patient quality time with our providers, please arrive at least 15 minutes before your scheduled appointment.  You need to re-schedule your appointment if you arrive 10 or more minutes late.  We strive to give you quality time with our providers, and arriving late affects you and other patients whose appointments are after yours.  Also, if you no show three or more times for appointments you may be dismissed from the clinic.  Again, thank you for choosing [place] at [place] hope is that these requests will allow you access to exceptional care and in a timely manner. _______________________________________________________________  If you have questions after your visit, please contact our office at [phone] between the hours of 8:30 a.m. and 5:00 p.m. Voicemails left after 4:30 p.m. will not be returned until the following business day. _______________________________________________________________  For prescription refill requests, have your pharmacy contact our office. _______________________________________________________________  Recommendations made by the consultant and any test results will be sent to your referring physician. _______________________________________________________________

## 2015-04-25 ENCOUNTER — Ambulatory Visit (HOSPITAL_COMMUNITY)
Admission: RE | Admit: 2015-04-25 | Discharge: 2015-04-25 | Disposition: A | Discharge status: Home / Self Care | Payer: Commercial Managed Care - HMO | Source: Ambulatory Visit | Attending: Radiation Oncology | Admitting: Radiation Oncology

## 2015-04-25 DIAGNOSIS — I2510 Atherosclerotic heart disease of native coronary artery without angina pectoris: Secondary | ICD-10-CM | POA: Diagnosis not present

## 2015-04-25 DIAGNOSIS — C760 Malignant neoplasm of head, face and neck: Secondary | ICD-10-CM

## 2015-04-25 DIAGNOSIS — R911 Solitary pulmonary nodule: Secondary | ICD-10-CM | POA: Diagnosis not present

## 2015-04-25 DIAGNOSIS — M19011 Primary osteoarthritis, right shoulder: Secondary | ICD-10-CM | POA: Insufficient documentation

## 2015-04-25 DIAGNOSIS — M19012 Primary osteoarthritis, left shoulder: Secondary | ICD-10-CM | POA: Insufficient documentation

## 2015-04-25 DIAGNOSIS — J439 Emphysema, unspecified: Secondary | ICD-10-CM | POA: Insufficient documentation

## 2015-04-25 DIAGNOSIS — C119 Malignant neoplasm of nasopharynx, unspecified: Secondary | ICD-10-CM | POA: Insufficient documentation

## 2015-04-25 LAB — GLUCOSE, CAPILLARY: GLUCOSE-CAPILLARY: 82 mg/dL (ref 70–99)

## 2015-04-25 MED ORDER — FLUDEOXYGLUCOSE F - 18 (FDG) INJECTION
6.5000 | Freq: Once | INTRAVENOUS | Status: AC | PRN
  Administered 2015-04-25: 6.5 via INTRAVENOUS

## 2015-04-27 ENCOUNTER — Encounter (HOSPITAL_COMMUNITY): Payer: Commercial Managed Care - HMO

## 2015-04-30 DIAGNOSIS — E039 Hypothyroidism, unspecified: Secondary | ICD-10-CM | POA: Diagnosis not present

## 2015-04-30 DIAGNOSIS — Z08 Encounter for follow-up examination after completed treatment for malignant neoplasm: Secondary | ICD-10-CM | POA: Diagnosis not present

## 2015-04-30 DIAGNOSIS — Z8589 Personal history of malignant neoplasm of other organs and systems: Secondary | ICD-10-CM | POA: Diagnosis not present

## 2015-04-30 DIAGNOSIS — Z87891 Personal history of nicotine dependence: Secondary | ICD-10-CM | POA: Diagnosis not present

## 2015-04-30 DIAGNOSIS — C760 Malignant neoplasm of head, face and neck: Secondary | ICD-10-CM | POA: Diagnosis not present

## 2015-06-05 ENCOUNTER — Ambulatory Visit (HOSPITAL_COMMUNITY): Payer: Commercial Managed Care - HMO | Admitting: Hematology & Oncology

## 2015-06-05 ENCOUNTER — Encounter (HOSPITAL_COMMUNITY): Payer: Commercial Managed Care - HMO

## 2015-06-07 ENCOUNTER — Encounter (HOSPITAL_COMMUNITY): Payer: Commercial Managed Care - HMO

## 2015-06-08 ENCOUNTER — Encounter (HOSPITAL_COMMUNITY): Payer: Commercial Managed Care - HMO | Attending: Hematology | Admitting: Hematology & Oncology

## 2015-06-08 ENCOUNTER — Encounter (HOSPITAL_COMMUNITY): Payer: Commercial Managed Care - HMO

## 2015-06-08 ENCOUNTER — Encounter (HOSPITAL_COMMUNITY): Payer: Self-pay | Admitting: Hematology & Oncology

## 2015-06-08 VITALS — BP 123/52 | HR 62 | Temp 97.6°F | Resp 16 | Wt 129.8 lb

## 2015-06-08 DIAGNOSIS — C103 Malignant neoplasm of posterior wall of oropharynx: Secondary | ICD-10-CM | POA: Insufficient documentation

## 2015-06-08 DIAGNOSIS — R911 Solitary pulmonary nodule: Secondary | ICD-10-CM | POA: Insufficient documentation

## 2015-06-08 DIAGNOSIS — R590 Localized enlarged lymph nodes: Secondary | ICD-10-CM | POA: Insufficient documentation

## 2015-06-08 DIAGNOSIS — Z85819 Personal history of malignant neoplasm of unspecified site of lip, oral cavity, and pharynx: Secondary | ICD-10-CM | POA: Diagnosis not present

## 2015-06-08 DIAGNOSIS — C770 Secondary and unspecified malignant neoplasm of lymph nodes of head, face and neck: Secondary | ICD-10-CM | POA: Diagnosis not present

## 2015-06-08 DIAGNOSIS — Z8546 Personal history of malignant neoplasm of prostate: Secondary | ICD-10-CM | POA: Diagnosis not present

## 2015-06-08 DIAGNOSIS — C760 Malignant neoplasm of head, face and neck: Secondary | ICD-10-CM | POA: Insufficient documentation

## 2015-06-08 LAB — COMPREHENSIVE METABOLIC PANEL
ALT: 17 U/L (ref 17–63)
AST: 22 U/L (ref 15–41)
Albumin: 4.1 g/dL (ref 3.5–5.0)
Alkaline Phosphatase: 51 U/L (ref 38–126)
Anion gap: 10 (ref 5–15)
BUN: 22 mg/dL — ABNORMAL HIGH (ref 6–20)
CO2: 27 mmol/L (ref 22–32)
Calcium: 9.5 mg/dL (ref 8.9–10.3)
Chloride: 100 mmol/L — ABNORMAL LOW (ref 101–111)
Creatinine, Ser: 0.77 mg/dL (ref 0.61–1.24)
GFR calc Af Amer: >60 mL/min (ref >60)
GFR calc non Af Amer: >60 mL/min (ref >60)
Glucose, Bld: 121 mg/dL — ABNORMAL HIGH (ref 65–99)
Potassium: 3.6 mmol/L (ref 3.5–5.1)
Sodium: 137 mmol/L (ref 135–145)
TOTAL PROTEIN: 7 g/dL (ref 6.5–8.1)
Total Bilirubin: 0.7 mg/dL (ref 0.3–1.2)

## 2015-06-08 LAB — CBC WITH DIFFERENTIAL/PLATELET
Basophils Absolute: 0 10*3/uL (ref 0.0–0.1)
Basophils Relative: 0 % (ref 0–1)
EOS PCT: 1 % (ref 0–5)
Eosinophils Absolute: 0 10*3/uL (ref 0.0–0.7)
HEMATOCRIT: 42.4 % (ref 39.0–52.0)
Hemoglobin: 13.5 g/dL (ref 13.0–17.0)
LYMPHS PCT: 31 % (ref 12–46)
Lymphs Abs: 0.9 10*3/uL (ref 0.7–4.0)
MCH: 27 pg (ref 26.0–34.0)
MCHC: 31.8 g/dL (ref 30.0–36.0)
MCV: 84.8 fL (ref 78.0–100.0)
MONOS PCT: 8 % (ref 3–12)
Monocytes Absolute: 0.2 10*3/uL (ref 0.1–1.0)
NEUTROS ABS: 1.7 10*3/uL (ref 1.7–7.7)
Neutrophils Relative %: 60 % (ref 43–77)
Platelets: 142 10*3/uL — ABNORMAL LOW (ref 150–400)
RBC: 5 MIL/uL (ref 4.22–5.81)
RDW: 15.1 % (ref 11.5–15.5)
WBC: 2.8 10*3/uL — ABNORMAL LOW (ref 4.0–10.5)

## 2015-06-08 MED ORDER — HEPARIN SOD (PORK) LOCK FLUSH 100 UNIT/ML IV SOLN
500.0000 [IU] | Freq: Once | INTRAVENOUS | Status: AC
  Administered 2015-06-08: 500 [IU] via INTRAVENOUS
  Filled 2015-06-08: qty 5

## 2015-06-08 MED ORDER — SODIUM CHLORIDE 0.9 % IJ SOLN
10.0000 mL | INTRAMUSCULAR | Status: DC | PRN
  Administered 2015-06-08: 10 mL via INTRAVENOUS
  Filled 2015-06-08: qty 10

## 2015-06-08 NOTE — Progress Notes (Signed)
See MD encounter 

## 2015-06-08 NOTE — Patient Instructions (Addendum)
  [place] at [facility] Discharge Instructions  RECOMMENDATIONS MADE BY THE CONSULTANT AND ANY TEST RESULTS WILL BE SENT TO YOUR REFERRING PHYSICIAN.  Exam and discussion by Dr. Imagene Gurney Report any new lumps, bone pain, unexplained weight loss, etc. Call with any concerns or questions Port flushes every 8 weeks Office visit in 3 months.  Thank you for choosing [place] at [facility] to provide your oncology and hematology care.  To afford each patient quality time with our provider, please arrive at least 15 minutes before your scheduled appointment time.    You need to re-schedule your appointment should you arrive 10 or more minutes late.  We strive to give you quality time with our providers, and arriving late affects you and other patients whose appointments are after yours.  Also, if you no show three or more times for appointments you may be dismissed from the clinic at the providers discretion.     Again, thank you for choosing [facility].  Our hope is that these requests will decrease the amount of time that you wait before being seen by our physicians.       _____________________________________________________________  Should you have questions after your visit to [facility], please contact our office at [phone] between the hours of 8:30 a.m. and 4:30 p.m.  Voicemails left after 4:30 p.m. will not be returned until the following business day.  For prescription refill requests, have your pharmacy contact our office.

## 2015-06-08 NOTE — Progress Notes (Signed)
Brandon Hall presented for [employer]. Labs per MD order drawn via Portacath located in the right chest wall accessed with  H 20 needle. Good blood return present. Procedure without incident.  Needle removed intact. Patient tolerated procedure well.

## 2015-06-08 NOTE — Progress Notes (Signed)
Br[place], [place] [address]  Squamous cell carcinoma of the head and neck, stage IV (L neck) Cisplatin/5-FU and XRT, finished XRT 12/18/2014  Prostate Cancer, definitive XRT   CURRENT THERAPY: Observation  INTERVAL HISTORY: Brandon Hall 59 y.o. male returns for follow-up of head and neck cancer. He is doing remarkably well.   He continues walks fairly frequently.  He walks anywhere from 2 to 3 miles a day.  He loves to cook. He is essentially unchanged from his last visit. All food intake is oral. He denies any coughing or choking while eating.  His weight is overall stable.  He is present today with 2 family members.  He says he is feeling good. In need of a appointment with his Ear, Nose, and Throat Doctor, Dr. Wilburn Cornelia in [place].  Will follow up with him to ensure this is scheduled  His next appointment with Dr. Isidore Moos is in November.   Has not been smoking or chewing tobacco.   MEDICAL HISTORY: Past Medical History  Diagnosis Date  . Graves disease   . Hypothyroidism, postradioiodine therapy   . History of shingles     RASH  02-03-2014  PER PCP RESOLVING  . Prostate cancer     DX  SEVERAL  YRS   AGO  WITH RADIATION THERAPY  . Cataract, secondary obscuring vision     LEFT  . Blindness of right eye   . Hypertension     NO MEDS ,  MONITORED BY PCP  . Arthritis   . 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 Cancer     pharyngeal ca  . Squamous cell carcinoma of head and neck 07/12/2014    has Hypertension; Graves disease; Blind right eye; Dyslipidemia; Abnormal transaminases; Hypothyroid; Shingles rash; Squamous cell carcinoma of head and neck; History of prostate cancer; and Vitamin D deficiency on his problem list.    Oncology History   Secondary and unspecified malignant neoplasm of lymph nodes of head, face, and neck, likely nasopharynx in origin   Primary site: Pharynx - Nasopharynx   Staging method: AJCC 7th  Edition   Clinical: Stage II (T1, N1, M0)   Summary: Stage II (T1, N1, M0)       Squamous cell carcinoma of head and neck   05/05/2014 Imaging CT scan of the neck show enlarged left lymph node measure less than 6 cm.   05/25/2014 Pathology Results [id number] is positive for squamous cell carcinoma.   05/25/2014 Procedure Lymph node biopsy of the left neck came back squamous cell carcinoma.   06/20/2014 Imaging PET CT scan show no evidence of distant metastatic disease.   06/29/2014 Surgery Laryngoscopy and random biopsy of the nasopharynx was negative for malignancy.   07/27/2014 - 09/27/2014 Chemotherapy Cisplatin + 5FU (days 1-5) every 28 days 4 cycles   11/06/2014 - 12/25/2014 Radiation Therapy In [place] with Dr. Isidore Moos   04/25/2015 Imaging PET - Restaging.  No evidence of hypermetabolic recurrent or metastatic disease.   05/17/2015 Pathology Results [id number]:  p16 (HPV) negative.     has No Known Allergies.  We administered heparin lock flush and sodium chloride.  SURGICAL HISTORY: Past Surgical History  Procedure Laterality Date  . Colonoscopy  10/20/2011    Procedure: COLONOSCOPY;  Surgeon: Daneil Dolin, MD;  [facility];  Service: Endoscopy;  Laterality: N/A;  1:00 pm  . Cryoablation N/A 04/07/2014    Procedure: SALVAGE CRYO ABLATION PROSTATE;  Surgeon: Lowella Bandy, MD;  Location: [facility];  Service: Urology;  Laterality: N/A;  . Laryngoscopy    . Lymph node biopsy      SOCIAL HISTORY: History   Social History  . Marital Status: Married    Spouse Name: N/A  . Number of Children: N/A  . Years of Education: N/A   Occupational History  . Not on file.   Social History Main Topics  . Smoking status: Former Smoker -- 1.00 packs/day for 40 years    Types: Cigarettes    Quit date: 09/29/2013  . Smokeless tobacco: Never Used  . Alcohol Use: No  . Drug Use: No  . Sexual Activity: Not on file   Other Topics Concern  . Not on file   Social  History Narrative    FAMILY HISTORY: Family History  Problem Relation Age of Onset  . Colon cancer Father   . Hypertension Mother     Review of Systems  Constitutional: Negative for fever, chills, weight loss and malaise/fatigue.  HENT: Negative for congestion, hearing loss, nosebleeds, sore throat and tinnitus.   Eyes: Negative for blurred vision, double vision, pain and discharge.  Respiratory: Negative for cough, hemoptysis, sputum production, shortness of breath and wheezing.   Cardiovascular: Negative for chest pain, palpitations, claudication, leg swelling and PND.  Gastrointestinal: Negative for heartburn, nausea, vomiting, abdominal pain, diarrhea, constipation, blood in stool and melena.  Genitourinary: Negative for dysuria, urgency, frequency and hematuria.  Musculoskeletal: Negative for myalgias, joint pain and falls.  Skin: Negative for itching and rash.  Neurological: Negative for dizziness, tingling, tremors, sensory change, speech change, focal weakness, seizures, loss of consciousness, weakness and headaches.  Endo/Heme/Allergies: Does not bruise/bleed easily.  Psychiatric/Behavioral: Negative for depression, suicidal ideas, memory loss and substance abuse. The patient is not nervous/anxious and does not have insomnia.     PHYSICAL EXAMINATION  ECOG PERFORMANCE STATUS: 0 - Asymptomatic  Filed Vitals:   06/08/15 1000  BP: 123/52  Pulse: 62  Temp: 97.6 F (36.4 C)  Resp: 16    Physical Exam  Constitutional: He is oriented to person, place, and time.  THIN  HENT:  Head: Normocephalic and atraumatic.  Nose: Nose normal.  Mouth/Throat: Oropharynx is clear and moist. No oropharyngeal exudate.  Radiation in neck, mild texture  Eyes: Conjunctivae and EOM are normal. Pupils are equal, round, and reactive to light. Right eye exhibits no discharge. Left eye exhibits no discharge. No scleral icterus.  Neck: Neck supple. No tracheal deviation present. No thyromegaly  present.  Slightly limited with left range Cardiovascular: Normal rate, regular rhythm and normal heart sounds.  Exam reveals no gallop and no friction rub.   No murmur heard. Pulmonary/Chest: Effort normal and breath sounds normal. He has no wheezes. He has no rales.  R port a cath  Abdominal: Soft. Bowel sounds are normal. He exhibits no distension and no mass. There is no tenderness. There is no rebound and no guarding.  Musculoskeletal: Normal range of motion. He exhibits no edema.  Lymphadenopathy:    He has no cervical adenopathy.  Neurological: He is alert and oriented to person, place, and time. He has normal reflexes. No cranial nerve deficit. Gait normal. Coordination normal.  Skin: Skin is warm and dry. No rash noted.  Psychiatric: Mood, memory, affect and judgment normal.  Nursing note and vitals reviewed.   LABORATORY DATA:  CBC    Component Value Date/Time   WBC 2.8* 06/08/2015 1055   WBC 4.9  05/01/2014 1019   RBC 5.00 06/08/2015 1055   RBC 5.4 05/01/2014 1019   HGB 13.5 06/08/2015 1055   HGB 14.1 05/01/2014 1019   HCT 42.4 06/08/2015 1055   HCT 45.2 05/01/2014 1019   PLT 142* 06/08/2015 1055   MCV 84.8 06/08/2015 1055   MCV 83.3 05/01/2014 1019   MCH 27.0 06/08/2015 1055   MCH 26.0* 05/01/2014 1019   MCHC 31.8 06/08/2015 1055   MCHC 31.2* 05/01/2014 1019   RDW 15.1 06/08/2015 1055   LYMPHSABS 0.9 06/08/2015 1055   MONOABS 0.2 06/08/2015 1055   EOSABS 0.0 06/08/2015 1055   BASOSABS 0.0 06/08/2015 1055   CMP     Component Value Date/Time   NA 137 06/08/2015 1055   NA 141 02/07/2015 1043   K 3.6 06/08/2015 1055   CL 100* 06/08/2015 1055   CO2 27 06/08/2015 1055   GLUCOSE 121* 06/08/2015 1055   GLUCOSE 84 02/07/2015 1043   BUN 22* 06/08/2015 1055   BUN 15 02/07/2015 1043   CREATININE 0.77 06/08/2015 1055   CREATININE 0.89 07/19/2014 1527   CALCIUM 9.5 06/08/2015 1055   PROT 7.0 06/08/2015 1055   PROT 7.2 02/07/2015 1043   ALBUMIN 4.1 06/08/2015  1055   AST 22 06/08/2015 1055   ALT 17 06/08/2015 1055   ALKPHOS 51 06/08/2015 1055   BILITOT 0.7 06/08/2015 1055   BILITOT 0.3 02/07/2015 1043   GFRNONAA >60 06/08/2015 1055   GFRAA >60 06/08/2015 1055   RADIOLOGY  CLINICAL DATA: Subsequent treatment strategy for restaging of nasopharyngeal carcinoma.  EXAM: NUCLEAR MEDICINE PET SKULL BASE TO THIGH  TECHNIQUE: 6.5 mCi F-18 FDG was injected intravenously. Full-ring PET imaging was performed from the skull base to thigh after the radiotracer. CT data was obtained and used for attenuation correction and anatomic localization.  FASTING BLOOD GLUCOSE: Value: 82 mg/dl  COMPARISON: 10/18/2014  FINDINGS: NECK  Diffuse moderate hypermetabolism along the left sternocleidomastoid muscle. No well-defined residual or recurrent mass. Ill-defined soft tissue/ fat planes throughout the left side of the neck.  No hypermetabolic cervical lymph nodes.  CHEST  No areas of abnormal hypermetabolism.  ABDOMEN/PELVIS  Left adrenal hypermetabolism is improved to resolved. No developing nodule or mass. Right perirectal and left inguinal hypermetabolic nodes have resolved.  SKELETON  No abnormal marrow activity.  CT IMAGES PERFORMED FOR ATTENUATION CORRECTION  Right globe prosthesis.  Right Port-A-Cath which terminates at the high right atrium. Coronary artery atherosclerosis.  Centrilobular emphysema with biapical scarring.  Right middle lobe pulmonary nodule is unchanged at 6 mm on image 63.  Bilateral low-density renal lesions which are likely cysts. Degenerative partial fusion of the left sacroiliac joint. Bilateral shoulder osteoarthritis.  IMPRESSION: 1. No evidence of hypermetabolic recurrent or metastatic disease. 2. Diffuse left sternocleidomastoid moderate hypermetabolism is likely radiation induced. 3. Resolution of right perirectal and left inguinal nodal hypermetabolism. These nodes were  likely reactive. 4. Emphysema with similar indeterminate right middle lobe 5 mm pulmonary nodule.   Electronically Signed  By: Abigail Miyamoto M.D.  On: 04/25/2015 12:07   ASSESSMENT and THERAPY PLAN:   Squamous cell carcinoma of the head and neck, stage IV (L neck) Cisplatin/5-FU and XRT, finished XRT 12/18/2014  Prostate Cancer, definitive XRT  58 year old male with squamous cell carcinoma of the head and neck. He finished definitive therapy in December of last year. PET/CT was reviewed from April and looks good. We will ensure he has follow-up with his ENT. He is doing well. Weight is stable. I recommended  ongoing observation. We will see him back in several months, in regards to his prostate cancer he is followed by urology. His last PSA was up and I have encouraged him to continue to follow with urology.  All questions were answered. The patient knows to call the clinic with any problems, questions or concerns. We can certainly see the patient much sooner if necessary. //  This document serves as a record of services personally performed by Ancil Linsey, MD. It was created on her behalf by Janace Hoard, a trained medical scribe. The creation of this record is based on the scribe's personal observations and the provider's statements to them. This document has been checked and approved by the attending provider.  I have reviewed the above documentation for accuracy and completeness, and I agree with the above. This note was electronically signed.  Kelby Fam. Derry Arbogast, MD 06/12/2015

## 2015-06-12 ENCOUNTER — Encounter (HOSPITAL_COMMUNITY): Payer: Self-pay | Admitting: Hematology & Oncology

## 2015-06-12 DIAGNOSIS — Z859 Personal history of malignant neoplasm, unspecified: Secondary | ICD-10-CM | POA: Diagnosis not present

## 2015-06-25 ENCOUNTER — Encounter: Payer: Self-pay | Admitting: *Deleted

## 2015-06-26 ENCOUNTER — Ambulatory Visit (INDEPENDENT_AMBULATORY_CARE_PROVIDER_SITE_OTHER): Payer: Commercial Managed Care - HMO | Admitting: Physician Assistant

## 2015-06-26 ENCOUNTER — Encounter: Payer: Self-pay | Admitting: Physician Assistant

## 2015-06-26 VITALS — BP 138/83 | HR 71 | Temp 97.0°F | Ht 72.0 in | Wt 128.0 lb

## 2015-06-26 DIAGNOSIS — L0201 Cutaneous abscess of face: Secondary | ICD-10-CM | POA: Diagnosis not present

## 2015-06-26 DIAGNOSIS — L03211 Cellulitis of face: Secondary | ICD-10-CM | POA: Diagnosis not present

## 2015-06-26 MED ORDER — SULFAMETHOXAZOLE-TRIMETHOPRIM 800-160 MG PO TABS: 1.0000 | ORAL_TABLET | Freq: Two times a day (BID) | ORAL | Status: DC

## 2015-06-26 NOTE — Patient Instructions (Signed)

## 2015-06-26 NOTE — Addendum Note (Signed)
Addended by: Marylin Crosby on: 06/26/2015 05:26 PM   Modules accepted: Orders

## 2015-06-26 NOTE — Progress Notes (Signed)
Subjective:    Patient ID: Brandon Hall, male    DOB: [birth year], 59 y.o.   MRN: [id number]  HPI 59 y/o male with h/o squamous cell carcinoma of left head and neck presents with enlarging lesion on left side of face. He states that purulent discharge started last night. He in under the care on an Oncologist and was seen on 06/08/15. Patient and wife state that patient also has a h/o boils     Review of Systems  Constitutional: Negative.  Negative for fever, chills and diaphoresis.  HENT: Negative.   Skin:       Enlarging lesion on left side of face, drains at times. Denies pain        Objective:   Physical Exam  Constitutional: He is oriented to person, place, and time. No distress.  Malnourished in appearance   Cardiovascular: Normal rate and regular rhythm.   Neurological: He is alert and oriented to person, place, and time.  Skin: He is not diaphoretic.  2 cm nodule anterior to left ear, fluctuant on palpation, purulent material expressed on incision   Psychiatric: He has a normal mood and affect. His behavior is normal. Judgment and thought content normal.  Nursing note and vitals reviewed.         Assessment & Plan:  1. Cellulitis and abscess of face - Lesion incised and drained. Bandage applied. Will have patient follow up in 2 weeks for recheck - He should apply warm compresses TID - sulfamethoxazole-trimethoprim (BACTRIM DS,SEPTRA DS) 800-160 MG per tablet; Take 1 tablet by mouth 2 (two) times daily.  Dispense: 60 tablet; Refill: 0  RTO 2 weeks   Malania Gawthrop A. Chauncey Reading PA-C

## 2015-06-28 LAB — AEROBIC CULTURE

## 2015-07-03 ENCOUNTER — Ambulatory Visit: Payer: Commercial Managed Care - HMO | Admitting: Urology

## 2015-07-17 ENCOUNTER — Encounter (HOSPITAL_COMMUNITY): Payer: Commercial Managed Care - HMO

## 2015-07-17 DIAGNOSIS — H5210 Myopia, unspecified eye: Secondary | ICD-10-CM | POA: Diagnosis not present

## 2015-07-17 DIAGNOSIS — Z0100 Encounter for examination of eyes and vision without abnormal findings: Secondary | ICD-10-CM | POA: Diagnosis not present

## 2015-07-17 DIAGNOSIS — H5213 Myopia, bilateral: Secondary | ICD-10-CM | POA: Diagnosis not present

## 2015-07-20 ENCOUNTER — Encounter (HOSPITAL_COMMUNITY): Payer: Commercial Managed Care - HMO

## 2015-07-23 ENCOUNTER — Encounter (HOSPITAL_COMMUNITY): Payer: Commercial Managed Care - HMO

## 2015-07-24 ENCOUNTER — Ambulatory Visit (INDEPENDENT_AMBULATORY_CARE_PROVIDER_SITE_OTHER): Payer: Commercial Managed Care - HMO | Admitting: Urology

## 2015-07-24 DIAGNOSIS — C61 Malignant neoplasm of prostate: Secondary | ICD-10-CM

## 2015-07-24 DIAGNOSIS — N529 Male erectile dysfunction, unspecified: Secondary | ICD-10-CM | POA: Diagnosis not present

## 2015-08-02 ENCOUNTER — Encounter (HOSPITAL_COMMUNITY): Payer: Self-pay

## 2015-08-02 ENCOUNTER — Encounter (HOSPITAL_COMMUNITY): Payer: Commercial Managed Care - HMO | Attending: Hematology

## 2015-08-02 DIAGNOSIS — C770 Secondary and unspecified malignant neoplasm of lymph nodes of head, face and neck: Secondary | ICD-10-CM | POA: Insufficient documentation

## 2015-08-02 DIAGNOSIS — R590 Localized enlarged lymph nodes: Secondary | ICD-10-CM | POA: Insufficient documentation

## 2015-08-02 DIAGNOSIS — C103 Malignant neoplasm of posterior wall of oropharynx: Secondary | ICD-10-CM | POA: Insufficient documentation

## 2015-08-02 DIAGNOSIS — C61 Malignant neoplasm of prostate: Secondary | ICD-10-CM | POA: Diagnosis not present

## 2015-08-02 DIAGNOSIS — Z452 Encounter for adjustment and management of vascular access device: Secondary | ICD-10-CM

## 2015-08-02 DIAGNOSIS — C760 Malignant neoplasm of head, face and neck: Secondary | ICD-10-CM

## 2015-08-02 DIAGNOSIS — Z8546 Personal history of malignant neoplasm of prostate: Secondary | ICD-10-CM | POA: Insufficient documentation

## 2015-08-02 DIAGNOSIS — R911 Solitary pulmonary nodule: Secondary | ICD-10-CM | POA: Insufficient documentation

## 2015-08-02 MED ORDER — HEPARIN SOD (PORK) LOCK FLUSH 100 UNIT/ML IV SOLN
500.0000 [IU] | Freq: Once | INTRAVENOUS | Status: AC
  Administered 2015-08-02: 500 [IU] via INTRAVENOUS

## 2015-08-02 MED ORDER — SODIUM CHLORIDE 0.9 % IJ SOLN
10.0000 mL | INTRAMUSCULAR | Status: DC | PRN
  Administered 2015-08-02: 10 mL via INTRAVENOUS
  Filled 2015-08-02: qty 10

## 2015-08-02 NOTE — Progress Notes (Signed)
1450:  Brandon Hall presented for Portacath access and flush.  Portacath located left3 chest wall accessed with  H 20 needle.  Good blood return present. Portacath flushed with 32ml NS and 500U/2ml Heparin and needle removed intact.  Procedure tolerated well and without incident.

## 2015-08-03 ENCOUNTER — Other Ambulatory Visit: Payer: Self-pay | Admitting: Urology

## 2015-08-03 DIAGNOSIS — C61 Malignant neoplasm of prostate: Secondary | ICD-10-CM

## 2015-08-07 ENCOUNTER — Ambulatory Visit (HOSPITAL_COMMUNITY)
Admission: RE | Admit: 2015-08-07 | Discharge: 2015-08-07 | Disposition: A | Discharge status: Home / Self Care | Payer: Commercial Managed Care - HMO | Source: Ambulatory Visit | Attending: Urology | Admitting: Urology

## 2015-08-07 ENCOUNTER — Encounter (HOSPITAL_COMMUNITY): Payer: Self-pay

## 2015-08-07 ENCOUNTER — Encounter (HOSPITAL_COMMUNITY)
Admission: RE | Admit: 2015-08-07 | Discharge: 2015-08-07 | Disposition: A | Discharge status: Home / Self Care | Payer: Commercial Managed Care - HMO | Source: Ambulatory Visit | Attending: Urology | Admitting: Urology

## 2015-08-07 DIAGNOSIS — C61 Malignant neoplasm of prostate: Secondary | ICD-10-CM

## 2015-08-07 DIAGNOSIS — R937 Abnormal findings on diagnostic imaging of other parts of musculoskeletal system: Secondary | ICD-10-CM | POA: Insufficient documentation

## 2015-08-07 MED ORDER — TECHNETIUM TC 99M MEDRONATE IV KIT
25.0000 | PACK | Freq: Once | INTRAVENOUS | Status: AC | PRN
  Administered 2015-08-07: 24 via INTRAVENOUS

## 2015-08-08 ENCOUNTER — Encounter: Payer: Self-pay | Admitting: Family Medicine

## 2015-08-08 ENCOUNTER — Ambulatory Visit (INDEPENDENT_AMBULATORY_CARE_PROVIDER_SITE_OTHER): Payer: Commercial Managed Care - HMO | Admitting: Family Medicine

## 2015-08-08 VITALS — BP 142/90 | HR 57 | Temp 96.8°F | Ht 72.0 in | Wt 129.0 lb

## 2015-08-08 DIAGNOSIS — E038 Other specified hypothyroidism: Secondary | ICD-10-CM

## 2015-08-08 DIAGNOSIS — E559 Vitamin D deficiency, unspecified: Secondary | ICD-10-CM | POA: Diagnosis not present

## 2015-08-08 DIAGNOSIS — I10 Essential (primary) hypertension: Secondary | ICD-10-CM | POA: Diagnosis not present

## 2015-08-08 NOTE — Progress Notes (Signed)
Subjective:    Patient ID: Brandon Hall, male    DOB: [birth year], 59 y.o.   MRN: [id number]  HPI  59 year old gentleman who is here to follow-up hypertension, lipids, hypothyroidism,. He also has squamous cell carcinoma head and neck. Per history he is finished therapy but did have follow-up, what sounds like a bone scan yesterday. He has no specific complaints today. In reviewing his medicine he still takes 50,000 units vitamin D every 7 days. Last vitamin D level was low at 16.1  Patient Active Problem List   Diagnosis Date Noted  . Vitamin D deficiency 02/08/2015  . Squamous cell carcinoma of head and neck 07/12/2014  . History of prostate cancer 07/12/2014  . Shingles rash 02/03/2014  . Abnormal transaminases 10/02/2013  . Hypothyroid 10/02/2013  . Blind right eye 09/30/2013  . Dyslipidemia 09/30/2013  . Hypertension   . Graves disease    Outpatient Encounter Prescriptions as of 08/08/2015  Medication Sig  . hydrochlorothiazide (HYDRODIURIL) 12.5 MG tablet Take 1 tablet (12.5 mg total) by mouth daily.  Marland Kitchen HYDROcodone-acetaminophen (NORCO/VICODIN) 5-325 MG per tablet Take 1 tablet by mouth every 6 (six) hours as needed for moderate pain.  Marland Kitchen levothyroxine (SYNTHROID, LEVOTHROID) 100 MCG tablet Take 1 tablet (100 mcg total) by mouth daily.  . pravastatin (PRAVACHOL) 40 MG tablet Take 1 tablet (40 mg total) by mouth daily.  . Vitamin D, Ergocalciferol, (DRISDOL) 50000 UNITS CAPS capsule Take 1 capsule (50,000 Units total) by mouth every 7 (seven) days.  . [DISCONTINUED] naproxen sodium (ANAPROX) 220 MG tablet Take 220 mg by mouth daily as needed.  . [DISCONTINUED] sulfamethoxazole-trimethoprim (BACTRIM DS,SEPTRA DS) 800-160 MG per tablet Take 1 tablet by mouth 2 (two) times daily.   No facility-administered encounter medications on file as of 08/08/2015.       Review of Systems  Constitutional: Negative.   HENT: Negative.   Respiratory: Negative.   Cardiovascular: Negative.     Genitourinary: Negative.   Neurological: Negative.   Psychiatric/Behavioral: Negative.        Objective:   Physical Exam  Constitutional: He is oriented to person, place, and time. He appears well-developed and well-nourished.  Cardiovascular: Normal rate and regular rhythm.   Pulmonary/Chest: Effort normal and breath sounds normal.  Abdominal: Soft. He exhibits no mass. There is no tenderness.  Neurological: He is alert and oriented to person, place, and time.  Psychiatric: He has a normal mood and affect. His behavior is normal. Thought content normal.    BP 142/90 mmHg  Pulse 57  Temp(Src) 96.8 F (36 C) (Oral)  Ht 6' (1.829 m)  Wt 129 lb (58.514 kg)  BMI 17.49 kg/m2        Assessment & Plan:  1. Other specified hypothyroidism Last TSH was in normal range. Continue same replacement dose  2. Essential hypertension Blood pressure is fairly well controlled on hydrochlorothiazide. I am not inclined to adjust medicine  3. Vitamin D deficiency Last vitamin D level was low and there is some uncertainty about continuation with high-dose therapy so need to check a level - Vit D  25 hydroxy (rtn osteoporosis monitoring)  Frederica Kuster MD

## 2015-08-09 ENCOUNTER — Ambulatory Visit (HOSPITAL_COMMUNITY): Payer: Commercial Managed Care - HMO

## 2015-08-09 ENCOUNTER — Encounter (HOSPITAL_COMMUNITY): Payer: Commercial Managed Care - HMO

## 2015-08-09 LAB — VITAMIN D 25 HYDROXY (VIT D DEFICIENCY, FRACTURES): Vit D, 25-Hydroxy: 43.1 ng/mL (ref 30.0–100.0)

## 2015-08-10 ENCOUNTER — Ambulatory Visit: Payer: Commercial Managed Care - HMO | Admitting: Family Medicine

## 2015-08-31 ENCOUNTER — Encounter (HOSPITAL_COMMUNITY): Payer: Self-pay | Admitting: Oncology

## 2015-08-31 ENCOUNTER — Encounter (HOSPITAL_COMMUNITY): Payer: Commercial Managed Care - HMO | Attending: Hematology | Admitting: Oncology

## 2015-08-31 ENCOUNTER — Encounter (HOSPITAL_COMMUNITY): Payer: Commercial Managed Care - HMO | Attending: Oncology

## 2015-08-31 VITALS — BP 146/91 | HR 65 | Temp 97.4°F | Resp 16 | Wt 130.7 lb

## 2015-08-31 DIAGNOSIS — C103 Malignant neoplasm of posterior wall of oropharynx: Secondary | ICD-10-CM | POA: Diagnosis not present

## 2015-08-31 DIAGNOSIS — C770 Secondary and unspecified malignant neoplasm of lymph nodes of head, face and neck: Secondary | ICD-10-CM | POA: Insufficient documentation

## 2015-08-31 DIAGNOSIS — C4442 Squamous cell carcinoma of skin of scalp and neck: Secondary | ICD-10-CM

## 2015-08-31 DIAGNOSIS — C760 Malignant neoplasm of head, face and neck: Secondary | ICD-10-CM | POA: Diagnosis not present

## 2015-08-31 DIAGNOSIS — R911 Solitary pulmonary nodule: Secondary | ICD-10-CM | POA: Diagnosis not present

## 2015-08-31 DIAGNOSIS — R590 Localized enlarged lymph nodes: Secondary | ICD-10-CM | POA: Insufficient documentation

## 2015-08-31 DIAGNOSIS — Z8546 Personal history of malignant neoplasm of prostate: Secondary | ICD-10-CM | POA: Insufficient documentation

## 2015-08-31 LAB — CBC WITH DIFFERENTIAL/PLATELET
BASOS PCT: 0 % (ref 0–1)
Basophils Absolute: 0 10*3/uL (ref 0.0–0.1)
EOS PCT: 0 % (ref 0–5)
Eosinophils Absolute: 0 10*3/uL (ref 0.0–0.7)
HCT: 43.8 % (ref 39.0–52.0)
Hemoglobin: 14.2 g/dL (ref 13.0–17.0)
Lymphocytes Relative: 35 % (ref 12–46)
Lymphs Abs: 0.9 10*3/uL (ref 0.7–4.0)
MCH: 27.5 pg (ref 26.0–34.0)
MCHC: 32.4 g/dL (ref 30.0–36.0)
MCV: 84.9 fL (ref 78.0–100.0)
MONO ABS: 0.2 10*3/uL (ref 0.1–1.0)
Monocytes Relative: 8 % (ref 3–12)
Neutro Abs: 1.5 10*3/uL — ABNORMAL LOW (ref 1.7–7.7)
Neutrophils Relative %: 57 % (ref 43–77)
Platelets: 154 10*3/uL (ref 150–400)
RBC: 5.16 MIL/uL (ref 4.22–5.81)
RDW: 16.2 % — AB (ref 11.5–15.5)
WBC: 2.6 10*3/uL — ABNORMAL LOW (ref 4.0–10.5)

## 2015-08-31 LAB — COMPREHENSIVE METABOLIC PANEL
ALBUMIN: 4.4 g/dL (ref 3.5–5.0)
ALT: 14 U/L — AB (ref 17–63)
AST: 25 U/L (ref 15–41)
Alkaline Phosphatase: 49 U/L (ref 38–126)
Anion gap: 6 (ref 5–15)
BUN: 15 mg/dL (ref 6–20)
CHLORIDE: 99 mmol/L — AB (ref 101–111)
CO2: 28 mmol/L (ref 22–32)
Calcium: 9.1 mg/dL (ref 8.9–10.3)
Creatinine, Ser: 0.79 mg/dL (ref 0.61–1.24)
GFR calc Af Amer: >60 mL/min (ref >60)
GFR calc non Af Amer: >60 mL/min (ref >60)
Glucose, Bld: 84 mg/dL (ref 65–99)
POTASSIUM: 3.5 mmol/L (ref 3.5–5.1)
SODIUM: 133 mmol/L — AB (ref 135–145)
Total Bilirubin: 0.9 mg/dL (ref 0.3–1.2)
Total Protein: 7.2 g/dL (ref 6.5–8.1)

## 2015-08-31 LAB — TSH: TSH: 1.392 u[IU]/mL (ref 0.350–4.500)

## 2015-08-31 MED ORDER — HEPARIN SOD (PORK) LOCK FLUSH 100 UNIT/ML IV SOLN
500.0000 [IU] | Freq: Once | INTRAVENOUS | Status: AC
  Administered 2015-08-31: 500 [IU] via INTRAVENOUS
  Filled 2015-08-31: qty 5

## 2015-08-31 MED ORDER — SODIUM CHLORIDE 0.9 % IJ SOLN
10.0000 mL | INTRAMUSCULAR | Status: DC | PRN
  Administered 2015-08-31: 10 mL via INTRAVENOUS
  Filled 2015-08-31: qty 10

## 2015-08-31 NOTE — Patient Instructions (Signed)
..  [place] at [facility] Discharge Instructions  RECOMMENDATIONS MADE BY THE CONSULTANT AND ANY TEST RESULTS WILL BE SENT TO YOUR REFERRING PHYSICIAN.  Seen and discussion with Robynn Pane  PA-C. Call the cancer center with any question and/or concerns that you have.   Lab work today.   Port flush today.  Return in 3 months for lab work and follow up appt.    Thank you for choosing [place] at [facility] to provide your oncology and hematology care.  To afford each patient quality time with our provider, please arrive at least 15 minutes before your scheduled appointment time.    You need to re-schedule your appointment should you arrive 10 or more minutes late.  We strive to give you quality time with our providers, and arriving late affects you and other patients whose appointments are after yours.  Also, if you no show three or more times for appointments you may be dismissed from the clinic at the providers discretion.     Again, thank you for choosing [facility].  Our hope is that these requests will decrease the amount of time that you wait before being seen by our physicians.       _____________________________________________________________  Should you have questions after your visit to [facility], please contact our office at [phone] between the hours of 8:30 a.m. and 4:30 p.m.  Voicemails left after 4:30 p.m. will not be returned until the following business day.  For prescription refill requests, have your pharmacy contact our office.

## 2015-08-31 NOTE — Assessment & Plan Note (Addendum)
Squamous cell carcinoma of the head and neck, stage IV (L neck) treated with curative intent with Cisplatin/5-FU and XRT, finished XRT 12/18/2014.  PET imaging from April 2016 demonstrated NED.  Brandon Hall did see Dr. Wilburn Cornelia (ENT) over the summer.  I did review the note in the media tab in [facility].  Appears to have PRN follow-up with ENT.  TSH is being followed by primary care provider, Dr. Alain Honey at [facility] per note on 08/08/2015  Labs today as ordered: CBC diff, CMET, TSH  Labs in 3 months: CBC diff, CMET, TSH  Follow-up with Rad Onc as scheduled in November.  Return in 3 months for follow-up.  He questions when he can have his port removed.  I will defer that to Dr. Whitney Muse during next office visit.  We typically keep port in place for at least 1 year.

## 2015-08-31 NOTE — Progress Notes (Signed)
See office visit encounter. 

## 2015-08-31 NOTE — Progress Notes (Signed)
Brandon Honour, MD [place] [address]  History of prostate cancer  Squamous cell carcinoma of head and neck - Plan: CBC with Differential, Comprehensive metabolic panel, TSH, TSH, CBC with Differential  CURRENT THERAPY: Surveillance per NCCN guidelines  INTERVAL HISTORY: Brandon Hall 59 y.o. male returns for followup of Squamous cell carcinoma of the head and neck, stage IV (L neck) treated with curative intent with Cisplatin/5-FU and XRT, finished XRT 12/18/2014.  Additionally, he has prostate cancer that was treated with definitive XRT and is being followed by urology, Dr. Diona Fanti.  Oncology History   Secondary and unspecified malignant neoplasm of lymph nodes of head, face, and neck, likely nasopharynx in origin   Primary site: Pharynx - Nasopharynx   Staging method: AJCC 7th Edition   Clinical: Stage II (T1, N1, M0)   Summary: Stage II (T1, N1, M0)       Squamous cell carcinoma of head and neck   05/05/2014 Imaging CT scan of the neck show enlarged left lymph node measure less than 6 cm.   05/25/2014 Pathology Results [phone] is positive for squamous cell carcinoma.   05/25/2014 Procedure Lymph node biopsy of the left neck came back squamous cell carcinoma.   06/20/2014 Imaging PET CT scan show no evidence of distant metastatic disease.   06/29/2014 Surgery Laryngoscopy and random biopsy of the nasopharynx was negative for malignancy.   07/27/2014 - 09/27/2014 Chemotherapy Cisplatin + 5FU (days 1-5) every 28 days 4 cycles   11/06/2014 - 12/25/2014 Radiation Therapy [place] with Dr. Isidore Moos   04/25/2015 Imaging PET - [place].  No evidence of hypermetabolic recurrent or metastatic disease.   05/17/2015 Pathology Results [id number]:  p16 (HPV) negative.    I personally reviewed and went over laboratory results with the patient.  The results are noted within this dictation.  I personally reviewed and went over radiographic studies with the patient.  The  results are noted within this dictation.  Recent bone scan ordered by Dr. Diona Fanti is negative for any clear evidence of metastatic prostate cancer.  He continues to follow with urology.   He denies any complaints.  He is eating well and his appetite is strong.  His weight is stable at 130 lbs.  He did see Dr. Wilburn Cornelia this summer and he reports that he received a good report.  He notes that he does not have further follow-up with ENT at this time.  He notes that he was told he will see ENT again if Dr. Isidore Moos recommends it.  I think regular ENT follow-up is reasonable for direct visualization.  He denies any complaints today.  Past Medical History  Diagnosis Date  . Graves disease   . Hypothyroidism, postradioiodine therapy   . History of shingles     RASH  02-03-2014  PER PCP RESOLVING  . Prostate cancer     DX  SEVERAL  YRS   AGO  WITH RADIATION THERAPY  . Cataract, secondary obscuring vision     LEFT  . Blindness of right eye   . Hypertension     NO MEDS ,  MONITORED BY PCP  . Arthritis   . 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 Cancer     pharyngeal ca  . Squamous cell carcinoma of head and neck 07/12/2014    has Hypertension; Graves disease; Blind right eye; Dyslipidemia; Abnormal transaminases; Hypothyroid; Shingles rash; Squamous cell carcinoma of head and neck; History  of prostate cancer; and Vitamin D deficiency on his problem list.     has No Known Allergies.  Current Outpatient Prescriptions on File Prior to Visit  Medication Sig Dispense Refill  . hydrochlorothiazide (HYDRODIURIL) 12.5 MG tablet Take 1 tablet (12.5 mg total) by mouth daily. 90 tablet 3  . HYDROcodone-acetaminophen (NORCO/VICODIN) 5-325 MG per tablet Take 1 tablet by mouth every 6 (six) hours as needed for moderate pain. 60 tablet 0  . levothyroxine (SYNTHROID, LEVOTHROID) 100 MCG tablet Take 1 tablet (100 mcg total) by mouth daily. 90 tablet 3  . pravastatin (PRAVACHOL) 40 MG tablet Take 1  tablet (40 mg total) by mouth daily. 90 tablet 3  . Vitamin D, Ergocalciferol, (DRISDOL) 50000 UNITS CAPS capsule Take 1 capsule (50,000 Units total) by mouth every 7 (seven) days. 12 capsule 3   No current facility-administered medications on file prior to visit.    Past Surgical History  Procedure Laterality Date  . Colonoscopy  10/20/2011    Procedure: COLONOSCOPY;  Surgeon: Daneil Dolin, MD;  [facility];  Service: Endoscopy;  Laterality: N/A;  1:00 pm  . Cryoablation N/A 04/07/2014    Procedure: SALVAGE CRYO ABLATION PROSTATE;  Surgeon: Lowella Bandy, MD;  Location[facility];  Service: Urology;  Laterality: N/A;  . Laryngoscopy    . Lymph node biopsy      Denies any headaches, dizziness, double vision, fevers, chills, night sweats, nausea, vomiting, diarrhea, constipation, chest pain, heart palpitations, shortness of breath, blood in stool, black tarry stool, urinary pain, urinary burning, urinary frequency, hematuria.   PHYSICAL EXAMINATION  ECOG PERFORMANCE STATUS: 0 - Asymptomatic  Filed Vitals:   08/31/15 1000  BP: 146/91  Pulse: 65  Temp: 97.4 F (36.3 C)  Resp: 16    GENERAL:alert, healthy, no distress, well nourished, well developed, comfortable, cooperative, smiling and accompanied by wife and daughter. SKIN: skin color, texture, turgor are normal, no rashes or significant lesions HEAD: Normocephalic, No masses, lesions, tenderness or abnormalities EYES: normal, PERRLA, EOMI, Conjunctiva are pink and non-injected EARS: External ears normal OROPHARYNX:no exudate, no erythema, lips, buccal mucosa, and tongue normal, edentulous and mucous membranes are moist  NECK: supple, no adenopathy, thyroid normal size, non-tender, without nodularity, no stridor, non-tender, trachea midline LYMPH:  no palpable lymphadenopathy, no hepatosplenomegaly BREAST:not examined LUNGS: clear to auscultation and percussion HEART: regular rate & rhythm, no  murmurs, no gallops, S1 normal and S2 normal ABDOMEN:abdomen soft, non-tender, normal bowel sounds and no masses or organomegaly BACK: Back symmetric, no curvature., No CVA tenderness EXTREMITIES:less then 2 second capillary refill, no joint deformities, effusion, or inflammation, no edema, no skin discoloration, no clubbing, no cyanosis  NEURO: alert & oriented x 3 with fluent speech, no focal motor/sensory deficits, gait normal   LABORATORY DATA: CBC    Component Value Date/Time   WBC 2.8* 06/08/2015 1055   WBC 4.9 05/01/2014 1019   RBC 5.00 06/08/2015 1055   RBC 5.4 05/01/2014 1019   HGB 13.5 06/08/2015 1055   HGB 14.1 05/01/2014 1019   HCT 42.4 06/08/2015 1055   HCT 45.2 05/01/2014 1019   PLT 142* 06/08/2015 1055   MCV 84.8 06/08/2015 1055   MCV 83.3 05/01/2014 1019   MCH 27.0 06/08/2015 1055   MCH 26.0* 05/01/2014 1019   MCHC 31.8 06/08/2015 1055   MCHC 31.2* 05/01/2014 1019   RDW 15.1 06/08/2015 1055   LYMPHSABS 0.9 06/08/2015 1055   MONOABS 0.2 06/08/2015 1055   EOSABS 0.0 06/08/2015  1055   BASOSABS 0.0 06/08/2015 1055      Chemistry      Component Value Date/Time   NA 137 06/08/2015 1055   NA 141 02/07/2015 1043   K 3.6 06/08/2015 1055   CL 100* 06/08/2015 1055   CO2 27 06/08/2015 1055   BUN 22* 06/08/2015 1055   BUN 15 02/07/2015 1043   CREATININE 0.77 06/08/2015 1055   CREATININE 0.89 07/19/2014 1527      Component Value Date/Time   CALCIUM 9.5 06/08/2015 1055   ALKPHOS 51 06/08/2015 1055   AST 22 06/08/2015 1055   ALT 17 06/08/2015 1055   BILITOT 0.7 06/08/2015 1055   BILITOT 0.3 02/07/2015 1043        PENDING LABS:   RADIOGRAPHIC STUDIES:  Nm Bone Scan Whole Body  08/07/2015   CLINICAL DATA:  Prostate cancer.  EXAM: NUCLEAR MEDICINE WHOLE BODY BONE SCAN  TECHNIQUE: Whole body anterior and posterior images were obtained approximately 3 hours after intravenous injection of radiopharmaceutical.  RADIOPHARMACEUTICALS:  24.0 mCi Technetium-68m MDP  IV  COMPARISON:  None.  FINDINGS: Bilateral renal function and excretion is present. Mild increased activity noted over the left shoulder, most likely degenerative. No other significant focal bony abnormality identified.  IMPRESSION: 1. Mild increased activity noted of the left shoulder, most likely degenerative, left shoulder series can be obtained to further evaluate. 2. No other significant abnormality identified.   Electronically Signed   By: Marcello Moores  Register   On: 08/07/2015 13:45     PATHOLOGY:    ASSESSMENT AND PLAN:  Squamous cell carcinoma of head and neck Squamous cell carcinoma of the head and neck, stage IV (L neck) treated with curative intent with Cisplatin/5-FU and XRT, finished XRT 12/18/2014.  PET imaging from April 2016 demonstrated NED.  Mr. Vannice did see Dr. Wilburn Cornelia (ENT) over the summer.  I did review the note in the media tab in [facility].  Appears to have PRN follow-up with ENT.  TSH is being followed by primary care provider, Dr. Alain Honey at [facility] per note on 08/08/2015  Labs today as ordered: CBC diff, CMET, TSH  Labs in 3 months: CBC diff, CMET, TSH  Follow-up with Rad Onc as scheduled in November.  Return in 3 months for follow-up.  He questions when he can have his port removed.  I will defer that to Dr. Whitney Muse during next office visit.  We typically keep port in place for at least 1 year.  History of prostate cancer S/P curative XRT.  Followed by urology, Dr. Diona Fanti.  S/P bone scan that was negative for metastatic disease on 08/07/2015.  Continued follow-up as directed with urology is encouraged.    THERAPY PLAN:  NCCN guidelines for surveillance of Head and Neck cancer recommends:  A. H+P every 1-3 months for year 1  B. H+P every 2-6 months for year 2  C. H+P every 4-8 months for years 3-5  D. H+P every year for years greater than 5  E. Imaging only as clinically indicated following baseline imaging study within 6 months of completion of therapy.    F.Complete head and neck exam; mirror and fiberoptic examination as clinically indicated.  G. TSH every 6-12 months.  H. Chest imaging as clinically indicated for patients with smoking history  I. Speech/hearing and swallowing evaluation and rehabilitation as clinically indicated.  J. Smoking cessation and EtOH counseling as clinically indicated.  K. Dental evaluation: recommended for oral cavity and sites exposed to significant intraoral radiation treatment.  L. Consider EBV DNA monitoring for nasopharyngeal cancer.    All questions were answered. The patient knows to call the clinic with any problems, questions or concerns. We can certainly see the patient much sooner if necessary.  Patient and plan discussed with Dr. Ancil Linsey and she is in agreement with the aforementioned.   This note is electronically signed by: Robynn Pane, PA-C 08/31/2015 11:40 AM

## 2015-08-31 NOTE — Assessment & Plan Note (Signed)
S/P curative XRT.  Followed by urology, Dr. Diona Fanti.  S/P bone scan that was negative for metastatic disease on 08/07/2015.  Continued follow-up as directed with urology is encouraged.

## 2015-08-31 NOTE — Progress Notes (Signed)
Brandon Hall presented for labwork. Labs per MD order drawn via Portacath located in the right chest wall accessed with  H 20 needle. Good blood return present. Procedure without incident.  Needle removed intact. Patient tolerated procedure well.   

## 2015-09-17 ENCOUNTER — Other Ambulatory Visit: Payer: Self-pay | Admitting: Family

## 2015-10-12 ENCOUNTER — Encounter (HOSPITAL_COMMUNITY): Payer: Commercial Managed Care - HMO | Attending: Hematology

## 2015-10-12 ENCOUNTER — Encounter (HOSPITAL_COMMUNITY): Payer: Self-pay

## 2015-10-12 VITALS — BP 111/70 | HR 77 | Temp 98.0°F | Resp 20

## 2015-10-12 DIAGNOSIS — R590 Localized enlarged lymph nodes: Secondary | ICD-10-CM | POA: Insufficient documentation

## 2015-10-12 DIAGNOSIS — R911 Solitary pulmonary nodule: Secondary | ICD-10-CM | POA: Insufficient documentation

## 2015-10-12 DIAGNOSIS — Z95828 Presence of other vascular implants and grafts: Secondary | ICD-10-CM

## 2015-10-12 DIAGNOSIS — C760 Malignant neoplasm of head, face and neck: Secondary | ICD-10-CM | POA: Insufficient documentation

## 2015-10-12 DIAGNOSIS — C770 Secondary and unspecified malignant neoplasm of lymph nodes of head, face and neck: Secondary | ICD-10-CM | POA: Insufficient documentation

## 2015-10-12 DIAGNOSIS — Z8546 Personal history of malignant neoplasm of prostate: Secondary | ICD-10-CM | POA: Insufficient documentation

## 2015-10-12 DIAGNOSIS — Z23 Encounter for immunization: Secondary | ICD-10-CM

## 2015-10-12 DIAGNOSIS — C103 Malignant neoplasm of posterior wall of oropharynx: Secondary | ICD-10-CM | POA: Insufficient documentation

## 2015-10-12 MED ORDER — SODIUM CHLORIDE 0.9 % IJ SOLN
10.0000 mL | INTRAMUSCULAR | Status: DC | PRN
  Administered 2015-10-12: 10 mL via INTRAVENOUS
  Filled 2015-10-12: qty 10

## 2015-10-12 MED ORDER — INFLUENZA VAC SPLIT QUAD 0.5 ML IM SUSY
PREFILLED_SYRINGE | INTRAMUSCULAR | Status: AC
  Filled 2015-10-12: qty 0.5

## 2015-10-12 MED ORDER — HEPARIN SOD (PORK) LOCK FLUSH 100 UNIT/ML IV SOLN
500.0000 [IU] | Freq: Once | INTRAVENOUS | Status: AC
  Administered 2015-10-12: 500 [IU] via INTRAVENOUS
  Filled 2015-10-12: qty 5

## 2015-10-12 MED ORDER — INFLUENZA VAC SPLIT QUAD 0.5 ML IM SUSY
0.5000 mL | PREFILLED_SYRINGE | Freq: Once | INTRAMUSCULAR | Status: AC
  Administered 2015-10-12: 0.5 mL via INTRAMUSCULAR

## 2015-10-12 NOTE — Patient Instructions (Signed)
Port flush today Return as scheduled Please call the clinic if you have any questions or concerns 

## 2015-10-12 NOTE — Progress Notes (Signed)
Brandon Hall presented for Portacath access and flush.  Proper placement of portacath confirmed by CXR.  Portacath located right chest wall accessed with  H 20 needle.  Good blood return present. Portacath flushed with 20ml NS and 500U/5ml Heparin and needle removed intact.  Procedure tolerated well and without incident.    

## 2015-10-16 ENCOUNTER — Encounter: Payer: Self-pay | Admitting: Family Medicine

## 2015-10-30 DIAGNOSIS — Z8589 Personal history of malignant neoplasm of other organs and systems: Secondary | ICD-10-CM | POA: Diagnosis not present

## 2015-10-30 DIAGNOSIS — Z08 Encounter for follow-up examination after completed treatment for malignant neoplasm: Secondary | ICD-10-CM | POA: Diagnosis not present

## 2015-10-30 DIAGNOSIS — E039 Hypothyroidism, unspecified: Secondary | ICD-10-CM | POA: Diagnosis not present

## 2015-10-30 DIAGNOSIS — C760 Malignant neoplasm of head, face and neck: Secondary | ICD-10-CM | POA: Diagnosis not present

## 2015-11-01 ENCOUNTER — Other Ambulatory Visit: Payer: Self-pay | Admitting: Family

## 2015-11-13 ENCOUNTER — Encounter: Payer: Self-pay | Admitting: Family Medicine

## 2015-11-13 ENCOUNTER — Ambulatory Visit (INDEPENDENT_AMBULATORY_CARE_PROVIDER_SITE_OTHER): Payer: Commercial Managed Care - HMO | Admitting: Family Medicine

## 2015-11-13 VITALS — BP 133/78 | HR 63 | Temp 97.8°F | Ht 72.0 in | Wt 131.0 lb

## 2015-11-13 DIAGNOSIS — E785 Hyperlipidemia, unspecified: Secondary | ICD-10-CM | POA: Diagnosis not present

## 2015-11-13 DIAGNOSIS — I10 Essential (primary) hypertension: Secondary | ICD-10-CM | POA: Diagnosis not present

## 2015-11-13 DIAGNOSIS — E559 Vitamin D deficiency, unspecified: Secondary | ICD-10-CM | POA: Diagnosis not present

## 2015-11-13 DIAGNOSIS — E039 Hypothyroidism, unspecified: Secondary | ICD-10-CM

## 2015-11-13 MED ORDER — HYDROCHLOROTHIAZIDE 12.5 MG PO TABS: 12.5000 mg | ORAL_TABLET | Freq: Every day | ORAL | Status: DC

## 2015-11-13 MED ORDER — PRAVASTATIN SODIUM 40 MG PO TABS: 40.0000 mg | ORAL_TABLET | Freq: Every day | ORAL | Status: DC

## 2015-11-13 MED ORDER — LEVOTHYROXINE SODIUM 100 MCG PO TABS: 100.0000 ug | ORAL_TABLET | Freq: Every day | ORAL | Status: DC

## 2015-11-13 NOTE — Progress Notes (Signed)
Subjective:    Patient ID: Brandon Hall, male    DOB: [birth year], 59 y.o.   MRN: [id number]  HPI Patient here for 3 month follow up on hypertension also has squamous cell carcinoma head and neck, prostate cancer and Graves' disease. He has no specific complaints today. He tells me he has finished chemotherapy.    Review of Systems  Constitutional: Negative.   HENT: Negative.   Respiratory: Negative.   Cardiovascular: Negative.   Gastrointestinal: Negative.   Neurological: Negative.    Patient Active Problem List   Diagnosis Date Noted  . Vitamin D deficiency 02/08/2015  . Squamous cell carcinoma of head and neck (HCC) 07/12/2014  . History of prostate cancer 07/12/2014  . Shingles rash 02/03/2014  . Abnormal transaminases 10/02/2013  . Hypothyroid 10/02/2013  . Blind right eye 09/30/2013  . Dyslipidemia 09/30/2013  . Hypertension   . Graves disease    Outpatient Encounter Prescriptions as of 11/13/2015  Medication Sig  . hydrochlorothiazide (HYDRODIURIL) 12.5 MG tablet Take 1 tablet (12.5 mg total) by mouth daily.  Marland Kitchen HYDROcodone-acetaminophen (NORCO/VICODIN) 5-325 MG per tablet Take 1 tablet by mouth every 6 (six) hours as needed for moderate pain.  Marland Kitchen levothyroxine (SYNTHROID, LEVOTHROID) 100 MCG tablet TAKE 1 TABLET EVERY DAY  . pravastatin (PRAVACHOL) 40 MG tablet Take 1 tablet (40 mg total) by mouth daily.  . Vitamin D, Ergocalciferol, (DRISDOL) 50000 UNITS CAPS capsule Take 1 capsule (50,000 Units total) by mouth every 7 (seven) days. (Patient not taking: Reported on 11/13/2015)   No facility-administered encounter medications on file as of 11/13/2015.        Objective:   Physical Exam  Constitutional: He is oriented to person, place, and time. He appears well-developed.  HENT:  Head: Normocephalic.  There is cystic mass in the left malar area. It is soft and nontender and has not changed from by history.  Cardiovascular: Normal rate, regular rhythm and normal  heart sounds.   Pulmonary/Chest: Effort normal and breath sounds normal.  Neurological: He is alert and oriented to person, place, and time.   BP 133/78 mmHg  Pulse 63  Temp(Src) 97.8 F (36.6 C) (Oral)  Ht 6' (1.829 m)  Wt 131 lb (59.421 kg)  BMI 17.76 kg/m2        Assessment & Plan:  1. Essential hypertension Blood pressure is well managed on HCTZ. Continue same - hydrochlorothiazide (HYDRODIURIL) 12.5 MG tablet; Take 1 tablet (12.5 mg total) by mouth daily.  Dispense: 90 tablet; Refill: 3  2. Vitamin D deficiency Continues with 50,000 units every other week  should probably go back to maintenance dose of this time  3. Hyperlipemia Lipids are due to be checked in February.  4. Hypothyroidism, unspecified hypothyroidism type TSH was assessed in 2 months ago and was at normal level  Frederica Kuster MD

## 2015-11-20 ENCOUNTER — Other Ambulatory Visit: Payer: Self-pay | Admitting: Urology

## 2015-11-20 ENCOUNTER — Ambulatory Visit (INDEPENDENT_AMBULATORY_CARE_PROVIDER_SITE_OTHER): Payer: Commercial Managed Care - HMO | Admitting: Urology

## 2015-11-20 DIAGNOSIS — C61 Malignant neoplasm of prostate: Secondary | ICD-10-CM

## 2015-11-20 DIAGNOSIS — R972 Elevated prostate specific antigen [PSA]: Secondary | ICD-10-CM | POA: Diagnosis not present

## 2015-11-24 ENCOUNTER — Emergency Department (HOSPITAL_COMMUNITY)
Admission: EM | Admit: 2015-11-24 | Discharge: 2015-11-24 | Disposition: A | Discharge status: Home / Self Care | Payer: Commercial Managed Care - HMO | Attending: Emergency Medicine | Admitting: Emergency Medicine

## 2015-11-24 ENCOUNTER — Encounter (HOSPITAL_COMMUNITY): Payer: Self-pay

## 2015-11-24 DIAGNOSIS — Z79899 Other long term (current) drug therapy: Secondary | ICD-10-CM | POA: Insufficient documentation

## 2015-11-24 DIAGNOSIS — I10 Essential (primary) hypertension: Secondary | ICD-10-CM | POA: Diagnosis not present

## 2015-11-24 DIAGNOSIS — Z87891 Personal history of nicotine dependence: Secondary | ICD-10-CM | POA: Insufficient documentation

## 2015-11-24 DIAGNOSIS — E039 Hypothyroidism, unspecified: Secondary | ICD-10-CM | POA: Insufficient documentation

## 2015-11-24 DIAGNOSIS — M1990 Unspecified osteoarthritis, unspecified site: Secondary | ICD-10-CM | POA: Insufficient documentation

## 2015-11-24 DIAGNOSIS — Z8619 Personal history of other infectious and parasitic diseases: Secondary | ICD-10-CM | POA: Diagnosis not present

## 2015-11-24 DIAGNOSIS — H5441 Blindness, right eye, normal vision left eye: Secondary | ICD-10-CM | POA: Diagnosis not present

## 2015-11-24 DIAGNOSIS — Z8546 Personal history of malignant neoplasm of prostate: Secondary | ICD-10-CM | POA: Insufficient documentation

## 2015-11-24 DIAGNOSIS — L0231 Cutaneous abscess of buttock: Secondary | ICD-10-CM | POA: Diagnosis not present

## 2015-11-24 DIAGNOSIS — Z85818 Personal history of malignant neoplasm of other sites of lip, oral cavity, and pharynx: Secondary | ICD-10-CM | POA: Insufficient documentation

## 2015-11-24 MED ORDER — SULFAMETHOXAZOLE-TRIMETHOPRIM 800-160 MG PO TABS: 1.0000 | ORAL_TABLET | Freq: Two times a day (BID) | ORAL | Status: AC

## 2015-11-24 MED ORDER — HYDROCODONE-ACETAMINOPHEN 5-325 MG PO TABS
1.0000 | ORAL_TABLET | Freq: Once | ORAL | Status: AC
  Administered 2015-11-24: 1 via ORAL
  Filled 2015-11-24: qty 1

## 2015-11-24 MED ORDER — LIDOCAINE HCL (PF) 1 % IJ SOLN
10.0000 mL | Freq: Once | INTRAMUSCULAR | Status: AC
  Administered 2015-11-24: 10 mL
  Filled 2015-11-24: qty 10

## 2015-11-24 MED ORDER — HYDROCODONE-ACETAMINOPHEN 5-325 MG PO TABS: 1.0000 | ORAL_TABLET | ORAL | Status: DC | PRN

## 2015-11-24 MED ORDER — SULFAMETHOXAZOLE-TRIMETHOPRIM 800-160 MG PO TABS
1.0000 | ORAL_TABLET | Freq: Once | ORAL | Status: AC
  Administered 2015-11-24: 1 via ORAL
  Filled 2015-11-24: qty 1

## 2015-11-24 NOTE — ED Notes (Signed)
PA in room at this time lancing abcess

## 2015-11-24 NOTE — Discharge Instructions (Signed)
Percutaneous Abscess Drain, Care After °Refer to this sheet in the next few weeks. These instructions provide you with information on caring for yourself after your procedure. Your health care provider may also give you more specific instructions. Your treatment has been planned according to current medical practices, but problems sometimes occur. Call your health care provider if you have any problems or questions after your procedure. °WHAT TO EXPECT AFTER THE PROCEDURE °After your procedure, it is typical to have the following:  °· A small amount of discomfort in the area where the drainage tube was placed. °· A small amount of bruising around the area where the drainage tube was placed. °· Sleepiness and fatigue for the rest of the day from the medicines used. °HOME CARE INSTRUCTIONS °· Rest at home for 1-2 days following your procedure or as directed by your health care provider. °· If you go home right after the procedure, plan to have someone with you for 24 hours. °· Do not take a bath or shower for 24 hours after your procedure. °· Take medicines only as directed by your health care provider. Ask your health care provider when you can resume taking any normal medicines. °· Change bandages (dressings) as directed.   °· You may be told to record the amount of drainage from the bag every time you empty it. Follow your health care provider's directions for emptying the bag. Write down the amount of drainage, the date, and the time you emptied it. °· Call your health care provider when the drain is putting out less than 10 mL of drainage per day for 2-3 days in a row or as directed by your health care provider. °· Follow your health care provider's instructions for cleaning the drainage tube. You may need to clean the tube every day so that it does not clog. °SEEK MEDICAL CARE IF: °· You have increased bleeding (more than a small spot) from the site where the drainage tube was placed. °· You have redness,  swelling, or increasing pain around the site where the drainage tube was placed. °· You notice a discharge or bad smell coming from the site where the drainage tube was placed. °· You have a fever or chills.  °· You have pain that is not helped by medicine.   °SEEK IMMEDIATE MEDICAL CARE IF: °· There is leakage around the drainage tube. °· The drainage tube pulls out. °· You suddenly stop having drainage from the tube. °· You suddenly have blood in the drainage fluid. °· You become dizzy or faint. °· You develop a rash.   °· You have nausea or vomiting. °· You have difficulty breathing, feel short of breath, or feel faint.   °· You develop chest pain. °· You have problems with your speech or vision. °· You have trouble balancing or moving your arms or legs. °  °This information is not intended to replace advice given to you by your health care provider. Make sure you discuss any questions you have with your health care provider. °  °Document Released: 05/01/2014 Document Revised: 10/03/2014 Document Reviewed: 05/01/2014 °Elsevier Interactive Patient Education ©2016 [employer] Inc[employer] ° °

## 2015-11-24 NOTE — ED Notes (Signed)
Patient c/o of abscess on left buttock

## 2015-11-24 NOTE — ED Notes (Signed)
[place] papers given to pt  And his wife- reviewed prescriptions with wife in detail -- Discussed type of medication - time to be administered and importance of obtaining and starting ABT tomorrow am . Verbalized understanding and repeated back info-

## 2015-11-26 ENCOUNTER — Emergency Department (HOSPITAL_COMMUNITY)
Admission: EM | Admit: 2015-11-26 | Discharge: 2015-11-26 | Disposition: A | Discharge status: Home / Self Care | Payer: Commercial Managed Care - HMO | Attending: Emergency Medicine | Admitting: Emergency Medicine

## 2015-11-26 ENCOUNTER — Encounter (HOSPITAL_COMMUNITY): Payer: Self-pay | Admitting: Hematology & Oncology

## 2015-11-26 ENCOUNTER — Other Ambulatory Visit (HOSPITAL_COMMUNITY): Payer: Commercial Managed Care - HMO

## 2015-11-26 ENCOUNTER — Encounter (HOSPITAL_COMMUNITY): Payer: Self-pay | Admitting: Emergency Medicine

## 2015-11-26 ENCOUNTER — Encounter (HOSPITAL_COMMUNITY): Payer: Commercial Managed Care - HMO

## 2015-11-26 ENCOUNTER — Encounter (HOSPITAL_COMMUNITY): Payer: Commercial Managed Care - HMO | Attending: Hematology | Admitting: Hematology & Oncology

## 2015-11-26 ENCOUNTER — Ambulatory Visit (HOSPITAL_COMMUNITY): Payer: Commercial Managed Care - HMO | Admitting: Hematology & Oncology

## 2015-11-26 VITALS — BP 121/71 | HR 67 | Resp 18 | Wt 132.2 lb

## 2015-11-26 DIAGNOSIS — I10 Essential (primary) hypertension: Secondary | ICD-10-CM | POA: Diagnosis not present

## 2015-11-26 DIAGNOSIS — L0231 Cutaneous abscess of buttock: Secondary | ICD-10-CM | POA: Diagnosis not present

## 2015-11-26 DIAGNOSIS — Z87891 Personal history of nicotine dependence: Secondary | ICD-10-CM | POA: Insufficient documentation

## 2015-11-26 DIAGNOSIS — Z4801 Encounter for change or removal of surgical wound dressing: Secondary | ICD-10-CM | POA: Diagnosis present

## 2015-11-26 DIAGNOSIS — Z792 Long term (current) use of antibiotics: Secondary | ICD-10-CM | POA: Insufficient documentation

## 2015-11-26 DIAGNOSIS — H5441 Blindness, right eye, normal vision left eye: Secondary | ICD-10-CM | POA: Diagnosis not present

## 2015-11-26 DIAGNOSIS — R590 Localized enlarged lymph nodes: Secondary | ICD-10-CM | POA: Insufficient documentation

## 2015-11-26 DIAGNOSIS — Z95828 Presence of other vascular implants and grafts: Secondary | ICD-10-CM | POA: Diagnosis not present

## 2015-11-26 DIAGNOSIS — M1990 Unspecified osteoarthritis, unspecified site: Secondary | ICD-10-CM | POA: Diagnosis not present

## 2015-11-26 DIAGNOSIS — Z85818 Personal history of malignant neoplasm of other sites of lip, oral cavity, and pharynx: Secondary | ICD-10-CM | POA: Diagnosis not present

## 2015-11-26 DIAGNOSIS — Z8546 Personal history of malignant neoplasm of prostate: Secondary | ICD-10-CM | POA: Diagnosis not present

## 2015-11-26 DIAGNOSIS — Z4800 Encounter for change or removal of nonsurgical wound dressing: Secondary | ICD-10-CM | POA: Diagnosis not present

## 2015-11-26 DIAGNOSIS — R911 Solitary pulmonary nodule: Secondary | ICD-10-CM | POA: Insufficient documentation

## 2015-11-26 DIAGNOSIS — Z79899 Other long term (current) drug therapy: Secondary | ICD-10-CM | POA: Diagnosis not present

## 2015-11-26 DIAGNOSIS — C770 Secondary and unspecified malignant neoplasm of lymph nodes of head, face and neck: Secondary | ICD-10-CM | POA: Diagnosis not present

## 2015-11-26 DIAGNOSIS — Z8619 Personal history of other infectious and parasitic diseases: Secondary | ICD-10-CM | POA: Diagnosis not present

## 2015-11-26 DIAGNOSIS — E039 Hypothyroidism, unspecified: Secondary | ICD-10-CM | POA: Insufficient documentation

## 2015-11-26 DIAGNOSIS — C103 Malignant neoplasm of posterior wall of oropharynx: Secondary | ICD-10-CM | POA: Diagnosis not present

## 2015-11-26 DIAGNOSIS — C760 Malignant neoplasm of head, face and neck: Secondary | ICD-10-CM | POA: Diagnosis not present

## 2015-11-26 LAB — COMPREHENSIVE METABOLIC PANEL
ALK PHOS: 46 U/L (ref 38–126)
ALT: 14 U/L — ABNORMAL LOW (ref 17–63)
AST: 27 U/L (ref 15–41)
Albumin: 3.7 g/dL (ref 3.5–5.0)
Anion gap: 5 (ref 5–15)
BUN: 19 mg/dL (ref 6–20)
CALCIUM: 9.1 mg/dL (ref 8.9–10.3)
CO2: 28 mmol/L (ref 22–32)
Chloride: 101 mmol/L (ref 101–111)
Creatinine, Ser: 0.96 mg/dL (ref 0.61–1.24)
Glucose, Bld: 102 mg/dL — ABNORMAL HIGH (ref 65–99)
Potassium: 3.1 mmol/L — ABNORMAL LOW (ref 3.5–5.1)
SODIUM: 134 mmol/L — AB (ref 135–145)
Total Bilirubin: 0.5 mg/dL (ref 0.3–1.2)
Total Protein: 6.8 g/dL (ref 6.5–8.1)

## 2015-11-26 LAB — CBC WITH DIFFERENTIAL/PLATELET
Basophils Absolute: 0 10*3/uL (ref 0.0–0.1)
Basophils Relative: 0 %
EOS ABS: 0 10*3/uL (ref 0.0–0.7)
Eosinophils Relative: 1 %
HCT: 38.5 % — ABNORMAL LOW (ref 39.0–52.0)
HEMOGLOBIN: 12.6 g/dL — AB (ref 13.0–17.0)
LYMPHS ABS: 1 10*3/uL (ref 0.7–4.0)
LYMPHS PCT: 25 %
MCH: 27.6 pg (ref 26.0–34.0)
MCHC: 32.7 g/dL (ref 30.0–36.0)
MCV: 84.4 fL (ref 78.0–100.0)
MONOS PCT: 12 %
Monocytes Absolute: 0.5 10*3/uL (ref 0.1–1.0)
NEUTROS PCT: 62 %
Neutro Abs: 2.5 10*3/uL (ref 1.7–7.7)
Platelets: 148 10*3/uL — ABNORMAL LOW (ref 150–400)
RBC: 4.56 MIL/uL (ref 4.22–5.81)
RDW: 14.1 % (ref 11.5–15.5)
WBC: 4.1 10*3/uL (ref 4.0–10.5)

## 2015-11-26 LAB — TSH: TSH: 1.639 u[IU]/mL (ref 0.350–4.500)

## 2015-11-26 MED ORDER — HEPARIN SOD (PORK) LOCK FLUSH 100 UNIT/ML IV SOLN
500.0000 [IU] | Freq: Once | INTRAVENOUS | Status: AC
  Administered 2015-11-26: 500 [IU] via INTRAVENOUS

## 2015-11-26 MED ORDER — SODIUM CHLORIDE 0.9 % IJ SOLN
10.0000 mL | INTRAMUSCULAR | Status: DC | PRN
  Administered 2015-11-26: 10 mL via INTRAVENOUS
  Filled 2015-11-26: qty 10

## 2015-11-26 MED ORDER — HEPARIN SOD (PORK) LOCK FLUSH 100 UNIT/ML IV SOLN
INTRAVENOUS | Status: AC
  Filled 2015-11-26: qty 5

## 2015-11-26 NOTE — Progress Notes (Signed)
See office visit encounter. 

## 2015-11-26 NOTE — ED Provider Notes (Signed)
CSN: [id number]     Arrival date & time 11/26/15  1455 History  By signing my name below, I, Tula Nakayama, attest that this documentation has been prepared under the direction and in the presence of Helen Hashimoto, PA-C  Electronically Signed: Tula Nakayama, ED Scribe. 11/26/2015. 3:50 PM.   Chief Complaint  Patient presents with  . Wound Check   The history is provided by the patient. No language interpreter was used.    HPI Comments: Brandon Hall is a 59 y.o. male who presents to the Emergency Department for a wound check after I&D of an abscess 2 days ago. He reports improvement in pain and redness following procedure. Pt was seen in the ED on 11/26 for the abscess and was started on Bactrim and Hydrocodone for pain. He denies fever.  Past Medical History  Diagnosis Date  . Graves disease   . Hypothyroidism, postradioiodine therapy   . History of shingles     RASH  02-03-2014  PER PCP RESOLVING  . Prostate cancer ([place])     DX  SEVERAL  YRS   AGO  WITH RADIATION THERAPY  . Cataract, secondary obscuring vision     LEFT  . Blindness of right eye   . Hypertension     NO MEDS ,  MONITORED BY PCP  . Arthritis   . 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[place]) 06/29/14  . Cancer ([place])     pharyngeal ca  . Squamous cell carcinoma of head and neck ([place]) 07/12/2014   Past Surgical History  Procedure Laterality Date  . Colonoscopy  10/20/2011    Procedure: COLONOSCOPY;  Surgeon: Daneil Dolin, MD;  [facility];  Service: Endoscopy;  Laterality: N/A;  1:00 pm  . Cryoablation N/A 04/07/2014    Procedure: SALVAGE CRYO ABLATION PROSTATE;  Surgeon: Lowella Bandy, MD;  Location: [facility];  Service: Urology;  Laterality: N/A;  . Laryngoscopy    . Lymph node biopsy     Family History  Problem Relation Age of Onset  . Colon cancer Father   . Hypertension Mother    Social History  Substance Use Topics  . Smoking status: Former Smoker -- 1.00 packs/day for  40 years    Types: Cigarettes    Quit date: 09/29/2013  . Smokeless tobacco: Never Used  . Alcohol Use: No   Review of Systems  Constitutional: Negative for fever.  Skin: Positive for wound.  All other systems reviewed and are negative.  Allergies  Review of patient's allergies indicates no known allergies.  Home Medications   Prior to Admission medications   Medication Sig Start Date End Date Taking? Authorizing Provider  hydrochlorothiazide (HYDRODIURIL) 12.5 MG tablet Take 1 tablet (12.5 mg total) by mouth daily. 11/13/15   [name], MD  HYDROcodone-acetaminophen (NORCO/VICODIN) 5-325 MG tablet Take 1 tablet by mouth every 4 (four) hours as needed. 11/24/15   Evalee Jefferson, PA-C  levothyroxine (SYNTHROID, LEVOTHROID) 100 MCG tablet Take 1 tablet (100 mcg total) by mouth daily. 11/13/15   [name], MD  pravastatin (PRAVACHOL) 40 MG tablet Take 1 tablet (40 mg total) by mouth daily. 11/13/15   [name], MD  sulfamethoxazole-trimethoprim (BACTRIM DS,SEPTRA DS) 800-160 MG tablet Take 1 tablet by mouth 2 (two) times daily. 11/24/15 12/01/15  Evalee Jefferson, PA-C  Vitamin D, Ergocalciferol, (DRISDOL) 50000 UNITS CAPS capsule Take 1 capsule (50,000 Units total) by mouth every 7 (seven) days. 02/08/15   Christy A  Hawks, FNP   BP 123/79 mmHg  Pulse 61  Temp(Src) 97 F (36.1 C) (Temporal)  Resp 16  Ht 6' (1.829 m)  Wt 129 lb (58.514 kg)  BMI 17.49 kg/m2  SpO2 98% Physical Exam  Constitutional: He appears well-developed and well-nourished. No distress.  HENT:  Head: Normocephalic and atraumatic.  Eyes: Conjunctivae and EOM are normal.  Neck: Neck supple. No tracheal deviation present.  Cardiovascular: Normal rate.   Pulmonary/Chest: Effort normal. No respiratory distress.  Skin: Skin is warm and dry.  5 mm incision on his right buttock Open, packing removed Minimal drainage Healing well  Psychiatric: He has a normal mood and affect. His behavior is normal.   Nursing note and vitals reviewed.   ED Course  Procedures  DIAGNOSTIC STUDIES: Oxygen Saturation is 98% on RA, normal by my interpretation.    COORDINATION OF CARE: 3:50 PM Discussed treatment plan with pt which includes warm water soaks. Pt agreed to plan.  Labs Review Labs Reviewed - No data to display  Imaging Review No results found.   EKG Interpretation None      MDM   Final diagnoses:  Abscess of buttock, right    Dressing changed.  Pt advised to continue wound care and antibiotics  I personally performed the services in this documentation, which was scribed in my presence.  The recorded information has been reviewed and considered.   Ronnald Collum.  Hollace Kinnier [place], PA-C 11/26/15 1603  Ripley Fraise, MD 11/26/15 [phone]

## 2015-11-26 NOTE — Patient Instructions (Addendum)
[place] at [facility] Discharge Instructions  RECOMMENDATIONS MADE BY THE CONSULTANT AND ANY TEST RESULTS WILL BE SENT TO YOUR REFERRING PHYSICIAN.  Exam completed by Dr Whitney Muse today Port flush today with lab work Make sure to be following up with Dr Clinton Quant your ENT in [place] Follow up to see the doctor in 4 months with lab work  Port flush every 8 weeks. Please call the clinic if you have any questions or concerns  Thank you for choosing [place] at [facility] to provide your oncology and hematology care.  To afford each patient quality time with our provider, please arrive at least 15 minutes before your scheduled appointment time.    You need to re-schedule your appointment should you arrive 10 or more minutes late.  We strive to give you quality time with our providers, and arriving late affects you and other patients whose appointments are after yours.  Also, if you no show three or more times for appointments you may be dismissed from the clinic at the providers discretion.     Again, thank you for choosing [facility].  Our hope is that these requests will decrease the amount of time that you wait before being seen by our physicians.       _____________________________________________________________  Should you have questions after your visit to [facility], please contact our office at [phone] between the hours of 8:30 a.m. and 4:30 p.m.  Voicemails left after 4:30 p.m. will not be returned until the following business day.  For prescription refill requests, have your pharmacy contact our office.

## 2015-11-26 NOTE — Progress Notes (Signed)
Brandon Honour, MD [place] [address]  Squamous cell carcinoma of the head and neck, stage IV (L neck) Cisplatin/5-FU and XRT, finished XRT 12/18/2014  Prostate Cancer, definitive XRT   CURRENT THERAPY: Observation  INTERVAL HISTORY: Brandon Hall 59 y.o. male returns for follow-up of head and neck cancer. He is doing remarkably well.   He continues walks fairly frequently.  He walks anywhere from 2 to 3 miles a day.  He loves to cook. He is essentially unchanged from his last visit. All food intake is oral. He denies any coughing or choking while eating.  His weight is overall stable.  He reports having a "boil" lanced and packed recently, for which he received antibiotics.  His ENT is Dr. Wilburn Cornelia in [place]. He saw Dr. Wilburn Cornelia about 3 months ago according to his daughter. He's seen Dr. Isidore Moos recently, sometime in the last month.  He has had a flu shot.  He confirms that his bowels are okay. His companions confirm that he's pretty active, doing most of what he wants.  He also affirms that his hip is doing better.  He denies any use of cigarettes or chewing tobacco.   MEDICAL HISTORY: Past Medical History  Diagnosis Date  . Graves disease   . Hypothyroidism, postradioiodine therapy   . History of shingles     RASH  02-03-2014  PER PCP RESOLVING  . Prostate cancer ([place])     DX  SEVERAL  YRS   AGO  WITH RADIATION THERAPY  . Cataract, secondary obscuring vision     LEFT  . Blindness of right eye   . Hypertension     NO MEDS ,  MONITORED BY PCP  . Arthritis   . 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 Cancer ([place])     pharyngeal ca  . Squamous cell carcinoma of head and neck ([place]) 07/12/2014    has Hypertension; Graves disease; Blind right eye; Dyslipidemia; Abnormal transaminases; Hypothyroid; Shingles rash; Squamous cell carcinoma of head and neck ([place]); History of prostate cancer; and Vitamin D deficiency on his problem  list.    Oncology History   Secondary and unspecified malignant neoplasm of lymph nodes of head, face, and neck, likely nasopharynx in origin   Primary site: Pharynx - Nasopharynx   Staging method: AJCC 7th Edition   Clinical: Stage II (T1, N1, M0)   Summary: Stage II (T1, N1, M0)       Squamous cell carcinoma of head and neck ([place])   05/05/2014 Imaging CT scan of the neck show enlarged left lymph node measure less than 6 cm.   05/25/2014 Pathology Results [phone] is positive for squamous cell carcinoma.   05/25/2014 Procedure Lymph node biopsy of the left neck came back squamous cell carcinoma.   06/20/2014 Imaging PET CT scan show no evidence of distant metastatic disease.   06/29/2014 Surgery Laryngoscopy and random biopsy of the nasopharynx was negative for malignancy.   07/27/2014 - 09/27/2014 Chemotherapy Cisplatin + 5FU (days 1-5) every 28 days 4 cycles   11/06/2014 - 12/25/2014 Radiation Therapy In [place] with Dr. Isidore Moos   04/25/2015 Imaging PET - Restaging.  No evidence of hypermetabolic recurrent or metastatic disease.   05/17/2015 Pathology Results [id number]:  p16 (HPV) negative.     has No Known Allergies.  We administered heparin lock flush and sodium chloride.  SURGICAL HISTORY: Past Surgical History  Procedure Laterality Date  . Colonoscopy  10/20/2011  Procedure: COLONOSCOPY;  Surgeon: Daneil Dolin, MD;  [facility];  Service: Endoscopy;  Laterality: N/A;  1:00 pm  . Cryoablation N/A 04/07/2014    Procedure: SALVAGE CRYO ABLATION PROSTATE;  Surgeon: Lowella Bandy, MD;  Location: [facility];  Service: Urology;  Laterality: N/A;  . Laryngoscopy    . Lymph node biopsy      SOCIAL HISTORY: Social History   Social History  . Marital Status: Married    Spouse Name: N/A  . Number of Children: N/A  . Years of Education: N/A   Occupational History  . Not on file.   Social History Main Topics  . Smoking status: Former Smoker -- 1.00  packs/day for 40 years    Types: Cigarettes    Quit date: 09/29/2013  . Smokeless tobacco: Never Used  . Alcohol Use: No  . Drug Use: No  . Sexual Activity: Not on file   Other Topics Concern  . Not on file   Social History Narrative    FAMILY HISTORY: Family History  Problem Relation Age of Onset  . Colon cancer Father   . Hypertension Mother     Review of Systems  Constitutional: Negative for fever, chills, weight loss and malaise/fatigue.  HENT: Negative for congestion, hearing loss, nosebleeds, sore throat and tinnitus.   Eyes: Negative for blurred vision, double vision, pain and discharge.  Respiratory: Negative for cough, hemoptysis, sputum production, shortness of breath and wheezing.   Cardiovascular: Negative for chest pain, palpitations, claudication, leg swelling and PND.  Gastrointestinal: Negative for heartburn, nausea, vomiting, abdominal pain, diarrhea, constipation, blood in stool and melena.  Genitourinary: Negative for dysuria, urgency, frequency and hematuria.  Musculoskeletal: Negative for myalgias, joint pain and falls.  Skin: Negative for itching and rash.  Neurological: Negative for dizziness, tingling, tremors, sensory change, speech change, focal weakness, seizures, loss of consciousness, weakness and headaches.  Endo/Heme/Allergies: Does not bruise/bleed easily.  Psychiatric/Behavioral: Negative for depression, suicidal ideas, memory loss and substance abuse. The patient is not nervous/anxious and does not have insomnia.    14 point review of systems was performed and is negative except as detailed under history of present illness and above  PHYSICAL EXAMINATION  ECOG PERFORMANCE STATUS: 0 - Asymptomatic  Filed Vitals:   11/26/15 1400  BP: 121/71  Pulse: 67  Resp: 18    Physical Exam  Constitutional: He is oriented to person, place, and time.  THIN  HENT:  Head: Normocephalic and atraumatic. Abnormal R eye, chronic  Lymphedema under his  chin Nose: Nose normal.  Mouth/Throat: Oropharynx is clear and moist. No oropharyngeal exudate.   Radiation in neck, mild texture  Eyes: Abnormal R eye (chronic) no discharge noted . No scleral icterus.  Neck: Neck supple. No tracheal deviation present. No thyromegaly present.   Slightly limited with left range Cardiovascular: Normal rate, regular rhythm and normal heart sounds.  Exam reveals no gallop and no friction rub.   No murmur heard. Pulmonary/Chest: Effort normal and breath sounds normal. He has no wheezes. He has no rales.   R port a cath  Abdominal: Soft. Bowel sounds are normal. He exhibits no distension and no mass. There is no tenderness. There is no rebound and no guarding.  Musculoskeletal: Normal range of motion. He exhibits no edema.  Lymphadenopathy:    He has no cervical adenopathy.  Neurological: He is alert and oriented to person, place, and time. He has normal reflexes. No cranial nerve deficit. Gait normal. Coordination  normal.  Skin: Skin is warm and dry. No rash noted.  Psychiatric: Mood, memory, affect and judgment normal.  Nursing note and vitals reviewed.   LABORATORY DATA: I have reviewed the data as listed  CBC    Component Value Date/Time   WBC 2.6* 08/31/2015 1150   WBC 4.9 05/01/2014 1019   RBC 5.16 08/31/2015 1150   RBC 5.4 05/01/2014 1019   HGB 14.2 08/31/2015 1150   HGB 14.1 05/01/2014 1019   HCT 43.8 08/31/2015 1150   HCT 45.2 05/01/2014 1019   PLT 154 08/31/2015 1150   MCV 84.9 08/31/2015 1150   MCV 83.3 05/01/2014 1019   MCH 27.5 08/31/2015 1150   MCH 26.0* 05/01/2014 1019   MCHC 32.4 08/31/2015 1150   MCHC 31.2* 05/01/2014 1019   RDW 16.2* 08/31/2015 1150   LYMPHSABS 0.9 08/31/2015 1150   MONOABS 0.2 08/31/2015 1150   EOSABS 0.0 08/31/2015 1150   BASOSABS 0.0 08/31/2015 1150   CMP     Component Value Date/Time   NA 133* 08/31/2015 1150   NA 141 02/07/2015 1043   K 3.5 08/31/2015 1150   CL 99* 08/31/2015 1150   CO2 28  08/31/2015 1150   GLUCOSE 84 08/31/2015 1150   GLUCOSE 84 02/07/2015 1043   BUN 15 08/31/2015 1150   BUN 15 02/07/2015 1043   CREATININE 0.79 08/31/2015 1150   CREATININE 0.89 07/19/2014 1527   CALCIUM 9.1 08/31/2015 1150   PROT 7.2 08/31/2015 1150   PROT 7.2 02/07/2015 1043   ALBUMIN 4.4 08/31/2015 1150   ALBUMIN 4.7 02/07/2015 1043   AST 25 08/31/2015 1150   ALT 14* 08/31/2015 1150   ALKPHOS 49 08/31/2015 1150   BILITOT 0.9 08/31/2015 1150   BILITOT 0.3 02/07/2015 1043   GFRNONAA >60 08/31/2015 1150   GFRAA >60 08/31/2015 1150   RADIOLOGY  CLINICAL DATA: Subsequent treatment strategy for restaging of nasopharyngeal carcinoma.  EXAM: NUCLEAR MEDICINE PET SKULL BASE TO THIGH  TECHNIQUE: 6.5 mCi F-18 FDG was injected intravenously. Full-ring PET imaging was performed from the skull base to thigh after the radiotracer. CT data was obtained and used for attenuation correction and anatomic localization.  FASTING BLOOD GLUCOSE: Value: 82 mg/dl  COMPARISON: 10/18/2014  FINDINGS: NECK  Diffuse moderate hypermetabolism along the left sternocleidomastoid muscle. No well-defined residual or recurrent mass. Ill-defined soft tissue/ fat planes throughout the left side of the neck.  No hypermetabolic cervical lymph nodes.  CHEST  No areas of abnormal hypermetabolism.  ABDOMEN/PELVIS  Left adrenal hypermetabolism is improved to resolved. No developing nodule or mass. Right perirectal and left inguinal hypermetabolic nodes have resolved.  SKELETON  No abnormal marrow activity.  CT IMAGES PERFORMED FOR ATTENUATION CORRECTION  Right globe prosthesis.  Right Port-A-Cath which terminates at the high right atrium. Coronary artery atherosclerosis.  Centrilobular emphysema with biapical scarring.  Right middle lobe pulmonary nodule is unchanged at 6 mm on image 63.  Bilateral low-density renal lesions which are likely cysts. Degenerative  partial fusion of the left sacroiliac joint. Bilateral shoulder osteoarthritis.  IMPRESSION: 1. No evidence of hypermetabolic recurrent or metastatic disease. 2. Diffuse left sternocleidomastoid moderate hypermetabolism is likely radiation induced. 3. Resolution of right perirectal and left inguinal nodal hypermetabolism. These nodes were likely reactive. 4. Emphysema with similar indeterminate right middle lobe 5 mm pulmonary nodule.   Electronically Signed  By: Abigail Miyamoto M.D.  On: 04/25/2015 12:07   ASSESSMENT and THERAPY PLAN:   Squamous cell carcinoma of the head and neck, stage IV (  L neck) Cisplatin/5-FU and XRT, finished XRT 12/18/2014  Prostate Cancer, definitive XRT  59 year old male with squamous cell carcinoma of the head and neck. He finished definitive therapy in December of last year. PET/CT was reviewed from April and looks good. We will ensure he has follow-up with his ENT. He is doing well. Weight is stable. I recommended ongoing observation. We will see him back in several months, in regards to his prostate cancer he is followed by urology. His last PSA was up and I have encouraged him to continue to follow with urology.   Have Amy call Dr. Victorio Palm office to see if there is a follow-up appointment made.  He will see [place] back in 4 months. His TSH in September was normal. TSH will be checked again on return in 4 months.   Orders Placed This Encounter  Procedures  . CBC with Differential    Standing Status: Future     Number of Occurrences:      Standing Expiration Date: 11/25/2016  . Comprehensive metabolic panel    Standing Status: Future     Number of Occurrences:      Standing Expiration Date: 11/25/2016  . TSH    Standing Status: Future     Number of Occurrences:      Standing Expiration Date: 11/25/2016  . Schedule Portacath Flush Appointment    Schedule Portacath flush appointment    Orders Placed This Encounter  Procedures  .  Schedule Portacath Flush Appointment    Schedule Portacath flush appointment   All questions were answered. The patient knows to call the clinic with any problems, questions or concerns. We can certainly see the patient much sooner if necessary.   This document serves as a record of services personally performed by Ancil Linsey, MD. It was created on her behalf by Toni Amend, a trained medical scribe. The creation of this record is based on the scribe's personal observations and the provider's statements to them. This document has been checked and approved by the attending provider.  I have reviewed the above documentation for accuracy and completeness, and I agree with the above. This note was electronically signed.  Kelby Fam. Ivanell Deshotel, MD 11/26/2015

## 2015-11-26 NOTE — ED Notes (Signed)
Pt seen and evaluated by EDPa for initial assessment. 

## 2015-11-26 NOTE — ED Notes (Signed)
Pt states that he is here for packing removal from abscess 2 days ago.  States that it seems to be healing without difficulty.

## 2015-11-26 NOTE — ED Provider Notes (Deleted)
CSN: [id number]     Arrival date & time 11/26/15  1455 History   None    Chief Complaint  Patient presents with  . Wound Check     (Consider location/radiation/quality/duration/timing/severity/associated sxs/prior Treatment) HPI  Past Medical History  Diagnosis Date  . Graves disease   . Hypothyroidism, postradioiodine therapy   . History of shingles     RASH  02-03-2014  PER PCP RESOLVING  . Prostate cancer ([place])     DX  SEVERAL  YRS   AGO  WITH RADIATION THERAPY  . Cataract, secondary obscuring vision     LEFT  . Blindness of right eye   . Hypertension     NO MEDS ,  MONITORED BY PCP  . Arthritis   . 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[place]) 06/29/14  . Cancer ([place])     pharyngeal ca  . Squamous cell carcinoma of head and neck ([place]) 07/12/2014   Past Surgical History  Procedure Laterality Date  . Colonoscopy  10/20/2011    Procedure: COLONOSCOPY;  Surgeon: Daneil Dolin, MD;  [facility];  Service: Endoscopy;  Laterality: N/A;  1:00 pm  . Cryoablation N/A 04/07/2014    Procedure: SALVAGE CRYO ABLATION PROSTATE;  Surgeon: Lowella Bandy, MD;  Location: [facility];  Service: Urology;  Laterality: N/A;  . Laryngoscopy    . Lymph node biopsy     Family History  Problem Relation Age of Onset  . Colon cancer Father   . Hypertension Mother    Social History  Substance Use Topics  . Smoking status: Former Smoker -- 1.00 packs/day for 40 years    Types: Cigarettes    Quit date: 09/29/2013  . Smokeless tobacco: Never Used  . Alcohol Use: No    Review of Systems    Allergies  Review of patient's allergies indicates no known allergies.  Home Medications   Prior to Admission medications   Medication Sig Start Date End Date Taking? Authorizing Provider  hydrochlorothiazide (HYDRODIURIL) 12.5 MG tablet Take 1 tablet (12.5 mg total) by mouth daily. 11/13/15   [name], MD  HYDROcodone-acetaminophen (NORCO/VICODIN) 5-325 MG tablet  Take 1 tablet by mouth every 4 (four) hours as needed. 11/24/15   Evalee Jefferson, PA-C  levothyroxine (SYNTHROID, LEVOTHROID) 100 MCG tablet Take 1 tablet (100 mcg total) by mouth daily. 11/13/15   [name], MD  pravastatin (PRAVACHOL) 40 MG tablet Take 1 tablet (40 mg total) by mouth daily. 11/13/15   [name], MD  sulfamethoxazole-trimethoprim (BACTRIM DS,SEPTRA DS) 800-160 MG tablet Take 1 tablet by mouth 2 (two) times daily. 11/24/15 12/01/15  Evalee Jefferson, PA-C  Vitamin D, Ergocalciferol, (DRISDOL) 50000 UNITS CAPS capsule Take 1 capsule (50,000 Units total) by mouth every 7 (seven) days. 02/08/15   Christy A Hawks, FNP   BP 123/79 mmHg  Pulse 61  Temp(Src) 97 F (36.1 C) (Temporal)  Resp 16  Ht 6' (1.829 m)  Wt 58.514 kg  BMI 17.49 kg/m2  SpO2 98% Physical Exam  ED Course  Procedures (including critical care time) Labs Review Labs Reviewed - No data to display  Imaging Review No results found. I have personally reviewed and evaluated these images and lab results as part of my medical decision-making.   EKG Interpretation None      MDM   Final diagnoses:  Abscess of buttock, right    Continue antibiotics, continue wound care    Fransico Meadow, PA-C 11/26/15 1602

## 2015-11-26 NOTE — Discharge Instructions (Signed)

## 2015-11-26 NOTE — Progress Notes (Signed)
Brandon Hall presented for Portacath access and flush.  Proper placement of portacath confirmed by CXR.  Portacath located right chest wall accessed with  H 20 needle.  Good blood return present. Portacath flushed with 20ml NS and 500U/5ml Heparin and needle removed intact.  Procedure tolerated well and without incident.    

## 2015-11-27 ENCOUNTER — Other Ambulatory Visit (HOSPITAL_COMMUNITY): Payer: Self-pay | Admitting: Oncology

## 2015-11-27 DIAGNOSIS — E876 Hypokalemia: Secondary | ICD-10-CM

## 2015-11-27 MED ORDER — POTASSIUM CHLORIDE CRYS ER 20 MEQ PO TBCR: 40.0000 meq | EXTENDED_RELEASE_TABLET | Freq: Every day | ORAL | Status: DC

## 2015-11-27 NOTE — ED Provider Notes (Signed)
CSN: [id number]     Arrival date & time 11/24/15  1657 History   First MD Initiated Contact with Patient 11/24/15 1805     Chief Complaint  Patient presents with  . Abscess     (Consider location/radiation/quality/duration/timing/severity/associated sxs/prior Treatment) Patient is a 59 y.o. male presenting with abscess. The history is provided by the patient.  Abscess Location:  Ano-genital Ano-genital abscess location:  L buttock Size:  5 cm Abscess quality: fluctuance, painful and redness   Abscess quality: not draining   Red streaking: no   Duration:  2 days Progression:  Worsening Pain details:    Quality:  Pressure, throbbing and sharp   Severity:  Moderate   Duration:  2 days   Timing:  Constant   Progression:  Worsening Chronicity:  Recurrent Context: not diabetes, not immunosuppression, not injected drug use, not insect bite/sting and not skin injury   Relieved by:  Nothing Ineffective treatments:  Warm compresses Associated symptoms: no fatigue, no fever, no nausea and no vomiting   Risk factors: prior abscess     Past Medical History  Diagnosis Date  . Graves disease   . Hypothyroidism, postradioiodine therapy   . History of shingles     RASH  02-03-2014  PER PCP RESOLVING  . Prostate cancer ([place])     DX  SEVERAL  YRS   AGO  WITH RADIATION THERAPY  . Cataract, secondary obscuring vision     LEFT  . Blindness of right eye   . Hypertension     NO MEDS ,  MONITORED BY PCP  . Arthritis   . 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[place]) 06/29/14  . Cancer ([place])     pharyngeal ca  . Squamous cell carcinoma of head and neck ([place]) 07/12/2014   Past Surgical History  Procedure Laterality Date  . Colonoscopy  10/20/2011    Procedure: COLONOSCOPY;  Surgeon: Daneil Dolin, MD;  [facility];  Service: Endoscopy;  Laterality: N/A;  1:00 pm  . Cryoablation N/A 04/07/2014    Procedure: SALVAGE CRYO ABLATION PROSTATE;  Surgeon: Lowella Bandy, MD;  [facility];  Service: Urology;  Laterality: N/A;  . Laryngoscopy    . Lymph node biopsy     Family History  Problem Relation Age of Onset  . Colon cancer Father   . Hypertension Mother    Social History  Substance Use Topics  . Smoking status: Former Smoker -- 1.00 packs/day for 40 years    Types: Cigarettes    Quit date: 09/29/2013  . Smokeless tobacco: Never Used  . Alcohol Use: No    Review of Systems  Constitutional: Negative for fever and fatigue.  Gastrointestinal: Negative.  Negative for nausea and vomiting.  Skin: Positive for color change and wound.      Allergies  Review of patient's allergies indicates no known allergies.  Home Medications   Prior to Admission medications   Medication Sig Start Date End Date Taking? Authorizing Provider  hydrochlorothiazide (HYDRODIURIL) 12.5 MG tablet Take 1 tablet (12.5 mg total) by mouth daily. 11/13/15  Yes [name], MD  levothyroxine (SYNTHROID, LEVOTHROID) 100 MCG tablet Take 1 tablet (100 mcg total) by mouth daily. 11/13/15  Yes [name], MD  pravastatin (PRAVACHOL) 40 MG tablet Take 1 tablet (40 mg total) by mouth daily. 11/13/15  Yes [name], MD  Vitamin D, Ergocalciferol, (DRISDOL) 50000 UNITS CAPS capsule Take 1 capsule (50,000 Units total) by mouth every  7 (seven) days. 02/08/15  Yes Sharion Balloon, FNP  HYDROcodone-acetaminophen (NORCO/VICODIN) 5-325 MG tablet Take 1 tablet by mouth every 4 (four) hours as needed. 11/24/15   Evalee Jefferson, PA-C  sulfamethoxazole-trimethoprim (BACTRIM DS,SEPTRA DS) 800-160 MG tablet Take 1 tablet by mouth 2 (two) times daily. 11/24/15 12/01/15  Evalee Jefferson, PA-C   BP 121/74 mmHg  Pulse 103  Temp(Src) 98.3 F (36.8 C)  Resp 18  Ht 5\' 10"  (1.778 m)  Wt 61.689 kg  BMI 19.51 kg/m2  SpO2 99% Physical Exam  Constitutional: He is oriented to person, place, and time. He appears well-developed and well-nourished.  HENT:  Head: Normocephalic.   Cardiovascular: Normal rate.   Pulmonary/Chest: Effort normal.  Neurological: He is alert and oriented to person, place, and time. No sensory deficit.  Skin:  4 cm fluctuant, raised abscess left buttock near midline.  It does not invade the rectum.  Minimal surrounding erythema, no red streaking.    ED Course  Procedures (including critical care time)   INCISION AND DRAINAGE Performed by: Evalee Jefferson Consent: Verbal consent obtained. Risks and benefits: risks, benefits and alternatives were discussed Type: abscess  Body area: left buttock  Anesthesia: local infiltration  Incision was made with a scalpel.  Local anesthetic: lidocaine 1% without epinephrine  Anesthetic total: 4 ml  Complexity: complex Blunt dissection to break up loculations  Drainage: purulent  Drainage amount: moderate  Packing material: 1/2 in iodoform gauze  Patient tolerance: Patient tolerated the procedure well with no immediate complications.    Labs Review Labs Reviewed - No data to display  Imaging Review No results found. I have personally reviewed and evaluated these images and lab results as part of my medical decision-making.   EKG Interpretation None      MDM   Final diagnoses:  Abscess of buttock, left    Pt placed on bactrim, hydrocodone. Advised recheck here in 2 days for packing removal.  Keep clean and covered until next visit and packing is removed at which time pt should start warm water soaks.      Evalee Jefferson, PA-C 11/27/15 [place], DO 11/27/15 2004

## 2015-12-17 ENCOUNTER — Other Ambulatory Visit: Payer: Self-pay | Admitting: Family

## 2015-12-18 NOTE — Telephone Encounter (Signed)
Last seen 11/13/15  Dr Sabra Heck   Last Vit D level 08/08/15  43.1

## 2016-01-01 ENCOUNTER — Encounter: Payer: Self-pay | Admitting: *Deleted

## 2016-01-02 ENCOUNTER — Telehealth: Payer: Self-pay | Admitting: Family Medicine

## 2016-01-02 DIAGNOSIS — C760 Malignant neoplasm of head, face and neck: Secondary | ICD-10-CM

## 2016-01-10 ENCOUNTER — Telehealth: Payer: Self-pay | Admitting: Family Medicine

## 2016-01-10 NOTE — Telephone Encounter (Signed)
Stp and he states he had a boil on his buttocks that started draining, pt states he doesn't have money to come in and be seen. Pt states it feels much better now and it isn't red, no purulent or foul drainage, no fevers or chills. Advised pt to keep the area dry and clean and if any of the before mentioned symptoms occur to call because he would need to be seen as those are signs of infection. Pt voiced understanding.

## 2016-01-21 ENCOUNTER — Encounter (HOSPITAL_COMMUNITY): Payer: Commercial Managed Care - HMO | Attending: Hematology

## 2016-01-21 ENCOUNTER — Ambulatory Visit (HOSPITAL_COMMUNITY)
Admission: RE | Admit: 2016-01-21 | Discharge: 2016-01-21 | Disposition: A | Discharge status: Home / Self Care | Payer: Commercial Managed Care - HMO | Source: Ambulatory Visit | Attending: Urology | Admitting: Urology

## 2016-01-21 ENCOUNTER — Encounter (HOSPITAL_COMMUNITY): Payer: Self-pay

## 2016-01-21 DIAGNOSIS — R911 Solitary pulmonary nodule: Secondary | ICD-10-CM | POA: Insufficient documentation

## 2016-01-21 DIAGNOSIS — C61 Malignant neoplasm of prostate: Secondary | ICD-10-CM | POA: Insufficient documentation

## 2016-01-21 DIAGNOSIS — C760 Malignant neoplasm of head, face and neck: Secondary | ICD-10-CM | POA: Insufficient documentation

## 2016-01-21 DIAGNOSIS — C770 Secondary and unspecified malignant neoplasm of lymph nodes of head, face and neck: Secondary | ICD-10-CM | POA: Insufficient documentation

## 2016-01-21 DIAGNOSIS — C140 Malignant neoplasm of pharynx, unspecified: Secondary | ICD-10-CM | POA: Insufficient documentation

## 2016-01-21 DIAGNOSIS — I2510 Atherosclerotic heart disease of native coronary artery without angina pectoris: Secondary | ICD-10-CM | POA: Diagnosis not present

## 2016-01-21 DIAGNOSIS — Z8546 Personal history of malignant neoplasm of prostate: Secondary | ICD-10-CM | POA: Insufficient documentation

## 2016-01-21 DIAGNOSIS — R590 Localized enlarged lymph nodes: Secondary | ICD-10-CM | POA: Insufficient documentation

## 2016-01-21 DIAGNOSIS — C103 Malignant neoplasm of posterior wall of oropharynx: Secondary | ICD-10-CM | POA: Insufficient documentation

## 2016-01-21 DIAGNOSIS — Z452 Encounter for adjustment and management of vascular access device: Secondary | ICD-10-CM

## 2016-01-21 LAB — POCT I-STAT CREATININE: Creatinine, Ser: 0.9 mg/dL (ref 0.61–1.24)

## 2016-01-21 MED ORDER — SODIUM CHLORIDE 0.9 % IJ SOLN
10.0000 mL | INTRAMUSCULAR | Status: DC | PRN
  Administered 2016-01-21: 10 mL via INTRAVENOUS
  Filled 2016-01-21: qty 10

## 2016-01-21 MED ORDER — HEPARIN SOD (PORK) LOCK FLUSH 100 UNIT/ML IV SOLN
500.0000 [IU] | Freq: Once | INTRAVENOUS | Status: AC
  Administered 2016-01-21: 500 [IU] via INTRAVENOUS

## 2016-01-21 MED ORDER — IOHEXOL 300 MG/ML  SOLN
100.0000 mL | Freq: Once | INTRAMUSCULAR | Status: AC | PRN
  Administered 2016-01-21: 100 mL via INTRAVENOUS

## 2016-01-21 NOTE — Progress Notes (Signed)
..  Daleen Bo Padovano presented for Portacath access and flush.   Portacath located rt chest wall accessed with  H 20 needle.  Good blood return present. Portacath flushed with 41ml NS and 500U/3ml Heparin and needle removed intact.  Procedure tolerated well and without incident.

## 2016-01-22 ENCOUNTER — Encounter (HOSPITAL_COMMUNITY): Payer: Commercial Managed Care - HMO

## 2016-02-18 ENCOUNTER — Ambulatory Visit: Payer: Commercial Managed Care - HMO | Admitting: Family Medicine

## 2016-02-19 ENCOUNTER — Encounter: Payer: Self-pay | Admitting: Family Medicine

## 2016-02-19 ENCOUNTER — Ambulatory Visit (INDEPENDENT_AMBULATORY_CARE_PROVIDER_SITE_OTHER): Payer: Commercial Managed Care - HMO | Admitting: Family Medicine

## 2016-02-19 VITALS — BP 119/74 | HR 64 | Temp 96.6°F | Ht 72.0 in | Wt 133.0 lb

## 2016-02-19 DIAGNOSIS — E039 Hypothyroidism, unspecified: Secondary | ICD-10-CM

## 2016-02-19 DIAGNOSIS — E559 Vitamin D deficiency, unspecified: Secondary | ICD-10-CM

## 2016-02-19 DIAGNOSIS — I10 Essential (primary) hypertension: Secondary | ICD-10-CM

## 2016-02-19 DIAGNOSIS — E785 Hyperlipidemia, unspecified: Secondary | ICD-10-CM | POA: Diagnosis not present

## 2016-02-19 NOTE — Progress Notes (Signed)
Subjective:    Patient ID: Brandon Hall, male    DOB: 26-Oct-1956, 60 y.o.   MRN: [id number]  HPI Pt here for follow up and management of chronic medical problems which includes hypertension and hyperlipidemia. He is taking medications regularly.       Patient Active Problem List   Diagnosis Date Noted  . Vitamin D deficiency 02/08/2015  . Squamous cell carcinoma of head and neck (HCC) 07/12/2014  . History of prostate cancer 07/12/2014  . Shingles rash 02/03/2014  . Abnormal transaminases 10/02/2013  . Hypothyroid 10/02/2013  . Blind right eye 09/30/2013  . Dyslipidemia 09/30/2013  . Hypertension   . Graves disease    Outpatient Encounter Prescriptions as of 02/19/2016  Medication Sig  . hydrochlorothiazide (HYDRODIURIL) 12.5 MG tablet Take 1 tablet (12.5 mg total) by mouth daily.  Marland Kitchen levothyroxine (SYNTHROID, LEVOTHROID) 100 MCG tablet Take 1 tablet (100 mcg total) by mouth daily.  . pravastatin (PRAVACHOL) 40 MG tablet Take 1 tablet (40 mg total) by mouth daily.  . Vitamin D, Ergocalciferol, (DRISDOL) 50000 UNITS CAPS capsule TAKE 1 CAPSULE (50,000 UNITS TOTAL) EVERY 7 DAYS.  . [DISCONTINUED] HYDROcodone-acetaminophen (NORCO/VICODIN) 5-325 MG tablet Take 1 tablet by mouth every 4 (four) hours as needed.  . [DISCONTINUED] potassium chloride SA (K-DUR,KLOR-CON) 20 MEQ tablet Take 2 tablets (40 mEq total) by mouth daily.   No facility-administered encounter medications on file as of 02/19/2016.     Review of Systems  Constitutional: Negative.   HENT: Negative.   Eyes: Negative.   Respiratory: Negative.   Cardiovascular: Negative.   Gastrointestinal: Negative.   Endocrine: Negative.   Genitourinary: Negative.   Musculoskeletal: Negative.   Skin: Negative.   Allergic/Immunologic: Negative.   Neurological: Negative.   Hematological: Negative.   Psychiatric/Behavioral: Negative.        Objective:   Physical Exam  Constitutional: He is oriented to person, place, and  time. He appears well-developed and well-nourished.  HENT:  Mouth/Throat: Oropharynx is clear and moist.  Cardiovascular: Normal rate and regular rhythm.   Pulmonary/Chest: Breath sounds normal.  Musculoskeletal: Normal range of motion.  Neurological: He is oriented to person, place, and time.  Psychiatric: He has a normal mood and affect. His behavior is normal.    BP 119/74 mmHg  Pulse 64  Temp(Src) 96.6 F (35.9 C) (Oral)  Ht 6' (1.829 m)  Wt 133 lb (60.328 kg)  BMI 18.03 kg/m2 \      Assessment & Plan:  1. Hyperlipemia Is on pravastatin lipids were not quite at goal one year ago. Repeat today - Lipid panel  2. Essential hypertension Are well controlled on hydrochlorothiazide 12.5 mg - CMP14+EGFR  3. Vitamin D deficiency She continues with vitamin D supplement  4. Hypothyroidism, unspecified hypothyroidism type SH at goal 4 months ago. Continue same dose of thyroid replacement  Frederica Kuster MD

## 2016-02-19 NOTE — Patient Instructions (Signed)
Continue current medications. Continue good therapeutic lifestyle changes which include good diet and exercise. Fall precautions discussed with patient. If an FOBT was given today- please return it to our front desk. If you are over 61 years old - you may need Prevnar 13 or the adult Pneumonia vaccine.  **Flu shots are available--- please call and schedule a FLU-CLINIC appointment**  After your visit with us today you will receive a survey in the mail or online from [employer] regarding your care with us. Please take a moment to fill this out. Your feedback is very important to us as you can help us better understand your patient needs as well as improve your experience and satisfaction. WE CARE ABOUT YOU!!!    

## 2016-02-20 LAB — CMP14+EGFR
ALK PHOS: 67 IU/L (ref 39–117)
ALT: 6 IU/L (ref 0–44)
AST: 25 IU/L (ref 0–40)
Albumin/Globulin Ratio: 1.7 (ref 1.1–2.5)
Albumin: 4.5 g/dL (ref 3.6–4.8)
BILIRUBIN TOTAL: 0.4 mg/dL (ref 0.0–1.2)
BUN/Creatinine Ratio: 15 (ref 10–22)
BUN: 15 mg/dL (ref 8–27)
CHLORIDE: 102 mmol/L (ref 96–106)
CO2: 21 mmol/L (ref 18–29)
CREATININE: 1.03 mg/dL (ref 0.76–1.27)
Calcium: 9.9 mg/dL (ref 8.6–10.2)
GFR calc Af Amer: 91 mL/min/{1.73_m2} (ref >59)
GFR calc non Af Amer: 79 mL/min/{1.73_m2} (ref >59)
GLOBULIN, TOTAL: 2.6 g/dL (ref 1.5–4.5)
GLUCOSE: 81 mg/dL (ref 65–99)
Potassium: 4.8 mmol/L (ref 3.5–5.2)
SODIUM: 146 mmol/L — AB (ref 134–144)
Total Protein: 7.1 g/dL (ref 6.0–8.5)

## 2016-02-20 LAB — LIPID PANEL
CHOLESTEROL TOTAL: 148 mg/dL (ref 100–199)
Chol/HDL Ratio: 2.1 ratio units (ref 0.0–5.0)
HDL: 72 mg/dL (ref >39)
LDL CALC: 67 mg/dL (ref 0–99)
Triglycerides: 44 mg/dL (ref 0–149)
VLDL CHOLESTEROL CAL: 9 mg/dL (ref 5–40)

## 2016-03-03 ENCOUNTER — Other Ambulatory Visit: Payer: Self-pay | Admitting: Family Medicine

## 2016-03-04 NOTE — Telephone Encounter (Signed)
Vit D level was 43.1 in 07/2015

## 2016-03-13 ENCOUNTER — Other Ambulatory Visit: Payer: Self-pay | Admitting: Family

## 2016-03-13 NOTE — Telephone Encounter (Signed)
Last Vit D 08/08/15  43.1  Dr Sabra Heck

## 2016-03-17 ENCOUNTER — Encounter (HOSPITAL_COMMUNITY): Payer: Self-pay | Admitting: Hematology & Oncology

## 2016-03-17 ENCOUNTER — Encounter (HOSPITAL_COMMUNITY): Payer: Commercial Managed Care - HMO

## 2016-03-17 ENCOUNTER — Encounter (HOSPITAL_COMMUNITY): Payer: Commercial Managed Care - HMO | Attending: Hematology | Admitting: Hematology & Oncology

## 2016-03-17 VITALS — BP 152/82 | HR 65 | Resp 18 | Wt 133.0 lb

## 2016-03-17 DIAGNOSIS — R911 Solitary pulmonary nodule: Secondary | ICD-10-CM | POA: Insufficient documentation

## 2016-03-17 DIAGNOSIS — Z95828 Presence of other vascular implants and grafts: Secondary | ICD-10-CM

## 2016-03-17 DIAGNOSIS — C103 Malignant neoplasm of posterior wall of oropharynx: Secondary | ICD-10-CM | POA: Diagnosis not present

## 2016-03-17 DIAGNOSIS — C760 Malignant neoplasm of head, face and neck: Secondary | ICD-10-CM | POA: Insufficient documentation

## 2016-03-17 DIAGNOSIS — Z8546 Personal history of malignant neoplasm of prostate: Secondary | ICD-10-CM | POA: Insufficient documentation

## 2016-03-17 DIAGNOSIS — R590 Localized enlarged lymph nodes: Secondary | ICD-10-CM | POA: Insufficient documentation

## 2016-03-17 DIAGNOSIS — C770 Secondary and unspecified malignant neoplasm of lymph nodes of head, face and neck: Secondary | ICD-10-CM | POA: Diagnosis not present

## 2016-03-17 LAB — COMPREHENSIVE METABOLIC PANEL
ALBUMIN: 3.9 g/dL (ref 3.5–5.0)
ALK PHOS: 53 U/L (ref 38–126)
ALT: 11 U/L — ABNORMAL LOW (ref 17–63)
ANION GAP: 7 (ref 5–15)
AST: 17 U/L (ref 15–41)
BUN: 18 mg/dL (ref 6–20)
CO2: 27 mmol/L (ref 22–32)
CREATININE: 0.78 mg/dL (ref 0.61–1.24)
Calcium: 9.2 mg/dL (ref 8.9–10.3)
Chloride: 102 mmol/L (ref 101–111)
GFR calc non Af Amer: >60 mL/min (ref >60)
GLUCOSE: 74 mg/dL (ref 65–99)
Potassium: 3.8 mmol/L (ref 3.5–5.1)
SODIUM: 136 mmol/L (ref 135–145)
TOTAL PROTEIN: 7.1 g/dL (ref 6.5–8.1)
Total Bilirubin: 0.4 mg/dL (ref 0.3–1.2)

## 2016-03-17 LAB — CBC WITH DIFFERENTIAL/PLATELET
BASOS PCT: 1 %
Basophils Absolute: 0 10*3/uL (ref 0.0–0.1)
EOS ABS: 0 10*3/uL (ref 0.0–0.7)
EOS PCT: 1 %
HCT: 39.9 % (ref 39.0–52.0)
Hemoglobin: 12.6 g/dL — ABNORMAL LOW (ref 13.0–17.0)
LYMPHS ABS: 1.3 10*3/uL (ref 0.7–4.0)
Lymphocytes Relative: 31 %
MCH: 26.9 pg (ref 26.0–34.0)
MCHC: 31.6 g/dL (ref 30.0–36.0)
MCV: 85.1 fL (ref 78.0–100.0)
MONO ABS: 0.4 10*3/uL (ref 0.1–1.0)
MONOS PCT: 9 %
NEUTROS PCT: 58 %
Neutro Abs: 2.4 10*3/uL (ref 1.7–7.7)
PLATELETS: 239 10*3/uL (ref 150–400)
RBC: 4.69 MIL/uL (ref 4.22–5.81)
RDW: 15.5 % (ref 11.5–15.5)
WBC: 4 10*3/uL (ref 4.0–10.5)

## 2016-03-17 LAB — TSH: TSH: 0.922 u[IU]/mL (ref 0.350–4.500)

## 2016-03-17 MED ORDER — SODIUM CHLORIDE 0.9% FLUSH
10.0000 mL | Freq: Once | INTRAVENOUS | Status: AC
Start: 2016-03-17 — End: 2016-03-17
  Administered 2016-03-17: 10 mL via INTRAVENOUS

## 2016-03-17 MED ORDER — HEPARIN SOD (PORK) LOCK FLUSH 100 UNIT/ML IV SOLN
500.0000 [IU] | Freq: Once | INTRAVENOUS | Status: AC
  Administered 2016-03-17: 500 [IU] via INTRAVENOUS

## 2016-03-17 NOTE — Patient Instructions (Addendum)
[place] at [name] Discharge Instructions  RECOMMENDATIONS MADE BY THE CONSULTANT AND ANY TEST RESULTS WILL BE SENT TO YOUR REFERRING PHYSICIAN.   Exam and discussion by Dr Whitney Muse today Port flush today with lab work, we will call you with those results  Port flushes every 6-8 weeks  Return to see the doctor in 6 months with labs  Please call the clinic if you have any questions or concerns    Thank you for choosing [place] at [facility] to provide your oncology and hematology care.  To afford each patient quality time with our provider, please arrive at least 15 minutes before your scheduled appointment time.   Beginning January 23rd 2017 lab work for the [facility] will be done in the  Main lab at [facility] on 1st floor. If you have a lab appointment with the [facility] please come in thru the  Main Entrance and check in at the main information desk  You need to re-schedule your appointment should you arrive 10 or more minutes late.  We strive to give you quality time with our providers, and arriving late affects you and other patients whose appointments are after yours.  Also, if you no show three or more times for appointments you may be dismissed from the clinic at the providers discretion.     Again, thank you for choosing [facility].  Our hope is that these requests will decrease the amount of time that you wait before being seen by our physicians.       _____________________________________________________________  Should you have questions after your visit to [facility], please contact our office at [phone] between the hours of 8:30 a.m. and 4:30 p.m.  Voicemails left after 4:30 p.m. will not be returned until the following business day.  For prescription refill requests, have your pharmacy contact our office.         Resources For Cancer Patients and their  Caregivers ? American Cancer Society: Can assist with transportation, wigs, general needs, runs Look Good Feel Better.        [phone] ? Cancer Care: Provides financial assistance, online support groups, medication/co-pay assistance.  1-800-813-HOPE 9178669862) ? [place] Assists [place] cancer patients and their families through emotional , educational and financial support.  [phone] ? [place] DSS Where to apply for food stamps, Medicaid and utility assistance. 417-704-5713 ? RCATS: Transportation to medical appointments. ([phone] ? Social Security Administration: May apply for disability if have a Stage IV cancer. 310-456-2814 [phone] ? [employer], Disability and Transit Services: Assists with nutrition, care and transit needs. [phone]

## 2016-03-17 NOTE — Progress Notes (Signed)
Brandon Honour, MD [place] [id number]  Squamous cell carcinoma of the head and neck, stage IV (L neck) Cisplatin/5-FU and XRT, finished XRT 12/18/2014  Prostate Cancer, definitive XRT   CURRENT THERAPY: Observation  INTERVAL HISTORY: Brandon Hall 60 y.o. male returns for follow-up of head and neck cancer. He is doing remarkably well.   Brandon Hall is accompanied by his sister. I personally reviewed and went over laboratory studies with the patient.  He is scheduled to see Dr. Isidore Moos again in June. He has seen Dr. Wilburn Cornelia recently, who said he was doing well. He scheduled to follow up with Dr. Diona Fanti of [facility] tomorrow, 3/21.  Denies sore throat or trouble swallowing. His bowels are all right. Denies swelling in his legs. He describes his appetite as good.    MEDICAL HISTORY: Past Medical History  Diagnosis Date  . Graves disease   . Hypothyroidism, postradioiodine therapy   . History of shingles     RASH  02-03-2014  PER PCP RESOLVING  . Prostate cancer ([place])     DX  SEVERAL  YRS   AGO  WITH RADIATION THERAPY  . Cataract, secondary obscuring vision     LEFT  . Blindness of right eye   . Hypertension     NO MEDS ,  MONITORED BY PCP  . Arthritis   . 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 Cancer ([place])     pharyngeal ca  . Squamous cell carcinoma of head and neck ([place]) 07/12/2014    has Hypertension; Graves disease; Blind right eye; Dyslipidemia; Abnormal transaminases; Hypothyroid; Shingles rash; Squamous cell carcinoma of head and neck ([place]); History of prostate cancer; and Vitamin D deficiency on his problem list.    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   05/25/2014 Pathology Results [id number] is positive for squamous cell carcinoma.   05/25/2014 Procedure Lymph node biopsy of the left neck came back squamous cell carcinoma.   06/20/2014 Imaging PET CT scan show no evidence of distant metastatic disease.   06/29/2014 Surgery Laryngoscopy and random biopsy of the nasopharynx was negative for malignancy.   07/27/2014 - 09/27/2014 Chemotherapy Cisplatin + 5FU (days 1-5) every 28 days 4 cycles   11/06/2014 - 12/25/2014 Radiation Therapy [place] with Dr. Isidore Moos   04/25/2015 Imaging PET - [place].  No evidence of hypermetabolic recurrent or metastatic disease.   05/17/2015 Pathology Results [id number]:  p16 (HPV) negative.     has No Known Allergies.  We administered heparin lock flush and sodium chloride.  SURGICAL HISTORY: Past Surgical History  Procedure Laterality Date  . Colonoscopy  10/20/2011    Procedure: COLONOSCOPY;  Surgeon: Daneil Dolin, MD;  [facility];  Service: Endoscopy;  Laterality: N/A;  1:00 pm  . Cryoablation N/A 04/07/2014    Procedure: SALVAGE CRYO ABLATION PROSTATE;  Surgeon: Lowella Bandy, MD;  Location: [facility];  Service: Urology;  Laterality: N/A;  . Laryngoscopy    . Lymph node  biopsy      SOCIAL HISTORY: Social History   Social History  . Marital Status: Married    Spouse Name: N/A  . Number of Children: N/A  . Years of Education: N/A   Occupational History  . Not on file.   Social History Main Topics  . Smoking status: Former Smoker -- 1.00 packs/day for 40 years    Types: Cigarettes    Quit date: 09/29/2013  . Smokeless tobacco: Never Used  . Alcohol Use: No  . Drug Use: No  . Sexual Activity: Not on file   Other Topics Concern  . Not on file   Social History Narrative    FAMILY HISTORY: Family History  Problem Relation Age of Onset  .  Colon cancer Father   . Hypertension Mother     Review of Systems  Constitutional: Negative for fever, chills, weight loss and malaise/fatigue.  HENT: Negative for congestion, hearing loss, nosebleeds, sore throat and tinnitus.   Eyes: Negative for blurred vision, double vision, pain and discharge.  Respiratory: Negative for cough, hemoptysis, sputum production, shortness of breath and wheezing.   Cardiovascular: Negative for chest pain, palpitations, claudication, leg swelling and PND.  Gastrointestinal: Negative for heartburn, nausea, vomiting, abdominal pain, diarrhea, constipation, blood in stool and melena.  Genitourinary: Negative for dysuria, urgency, frequency and hematuria.  Musculoskeletal: Negative for myalgias, joint pain and falls.  Skin: Negative for itching and rash.  Neurological: Negative for dizziness, tingling, tremors, sensory change, speech change, focal weakness, seizures, loss of consciousness, weakness and headaches.  Endo/Heme/Allergies: Does not bruise/bleed easily.  Psychiatric/Behavioral: Negative for depression, suicidal ideas, memory loss and substance abuse. The patient is not nervous/anxious and does not have insomnia.    14 point review of systems was performed and is negative except as detailed under history of present illness and above  PHYSICAL EXAMINATION  ECOG PERFORMANCE STATUS: 0 - Asymptomatic  Filed Vitals:   03/17/16 1300  BP: 152/82  Pulse: 65  Resp: 18    Physical Exam  Constitutional: He is oriented to person, place, and time.  THIN  HENT:  Head: Normocephalic and atraumatic. Abnormal R eye, chronic  Lymphedema under his chin Nose: Nose normal.  Mouth/Throat: Oropharynx is clear and moist. No oropharyngeal exudate.   Radiation changes to neck Eyes: Abnormal R eye (chronic) no discharge noted . No scleral icterus.  Neck: Neck supple. No tracheal deviation present. No thyromegaly present.  Cardiovascular: Normal rate, regular  rhythm and normal heart sounds.  Exam reveals no gallop and no friction rub.   No murmur heard. Pulmonary/Chest: Effort normal and breath sounds normal. He has no wheezes. He has no rales.   R port a cath  Abdominal: Soft. Bowel sounds are normal. He exhibits no distension and no mass. There is no tenderness. There is no rebound and no guarding.  Musculoskeletal: Normal range of motion. He exhibits no edema.  Lymphadenopathy:    He has no cervical adenopathy.  Neurological: He is alert and oriented to person, place, and time. He has normal reflexes. No cranial nerve deficit. Gait normal. Coordination normal.  Skin: Skin is warm and dry. No rash noted.  Psychiatric: Mood, memory, affect and judgment normal.  Nursing note and vitals reviewed.   LABORATORY DATA: I have reviewed the data as listed  Results for Brandon, Hall (MRN [id number]) as of 03/25/2016 22:27  Ref. Range 03/17/2016 14:17  Sodium Latest Ref Range: 135-145 mmol/L 136  Potassium Latest Ref Range: 3.5-5.1 mmol/L 3.8  Chloride Latest Ref Range: 101-111 mmol/L 102  CO2 Latest Ref Range: 22-32 mmol/L 27  BUN Latest Ref Range: 6-20 mg/dL 18  Creatinine Latest Ref Range: 0.61-1.24 mg/dL 0.78  Calcium Latest Ref Range: 8.9-10.3 mg/dL 9.2  EGFR (Non-African Amer.) Latest Ref Range: >60 mL/min >60  EGFR (African American) Latest Ref Range: >60 mL/min >60  Glucose Latest Ref Range: 65-99 mg/dL 74  Anion gap Latest Ref Range: 5-15  7  Alkaline Phosphatase Latest Ref Range: 38-126 U/L 53  Albumin Latest Ref Range: 3.5-5.0 g/dL 3.9  AST Latest Ref Range: 15-41 U/L 17  ALT Latest Ref Range: 17-63 U/L 11 (L)  Total Protein Latest Ref Range: 6.5-8.1 g/dL 7.1  Total Bilirubin Latest Ref Range: 0.3-1.2 mg/dL 0.4  WBC Latest Ref Range: 4.0-10.5 K/uL 4.0  RBC Latest Ref Range: 4.22-5.81 MIL/uL 4.69  Hemoglobin Latest Ref Range: 13.0-17.0 g/dL 12.6 (L)  HCT Latest Ref Range: 39.0-52.0 % 39.9  MCV Latest Ref Range: 78.0-100.0 fL 85.1    MCH Latest Ref Range: 26.0-34.0 pg 26.9  MCHC Latest Ref Range: 30.0-36.0 g/dL 31.6  RDW Latest Ref Range: 11.5-15.5 % 15.5  Platelets Latest Ref Range: 150-400 K/uL 239  Neutrophils Latest Units: % 58  Lymphocytes Latest Units: % 31  Monocytes Relative Latest Units: % 9  Eosinophil Latest Units: % 1  Basophil Latest Units: % 1  NEUT# Latest Ref Range: 1.7-7.7 K/uL 2.4  Lymphocyte # Latest Ref Range: 0.7-4.0 K/uL 1.3  Monocyte # Latest Ref Range: 0.1-1.0 K/uL 0.4  Eosinophils Absolute Latest Ref Range: 0.0-0.7 K/uL 0.0  Basophils Absolute Latest Ref Range: 0.0-0.1 K/uL 0.0  TSH Latest Ref Range: 0.350-4.500 uIU/mL 0.922    RADIOLOGY I have personally reviewed the radiological images as listed and agreed with the findings in the report. Study Result     CLINICAL DATA: Prostate cancer, pharyngeal cancer.  EXAM: CT ABDOMEN AND PELVIS WITH CONTRAST  TECHNIQUE: Multidetector CT imaging of the abdomen and pelvis was performed using the standard protocol following bolus administration of intravenous contrast.  CONTRAST: 111m OMNIPAQUE IOHEXOL 300 MG/ML SOLN  COMPARISON: PET 04/25/2015 and CT abdomen pelvis 09/19/2015.  FINDINGS: Lower chest: 5 mm right middle lobe nodule is unchanged from 09/18/2014. Heart size normal. Coronary artery calcification. No pericardial or pleural effusion.  Hepatobiliary: Low-attenuation lesions in the liver measure up to 8 mm in the left hepatic lobe, unchanged and possibly hemangiomas based on prior imaging. Liver and gallbladder are otherwise unremarkable. No biliary ductal dilatation.  Pancreas: Negative.  Spleen: 8 mm low-attenuation lesion in the spleen is unchanged and likely benign.  Adrenals/Urinary Tract: Adrenal glands are unremarkable. Numerous low-attenuation lesions are seen in the kidneys bilaterally, measuring up to 2.8 cm on the right, likely cysts although definitive characterization is difficult on some  lesions due to size or Hounsfield unit measurements above 10. Ureters are decompressed. Bladder is unremarkable.  Stomach/Bowel: Stomach, small bowel and colon are unremarkable. Appendix is not readily visualized.  Vascular/Lymphatic: Atherosclerotic calcification of the arterial vasculature without abdominal aortic aneurysm. No pathologically enlarged lymph nodes.  Reproductive: Prostate is atrophic or absent.  Other: No free fluid. Mesenteries and peritoneum are otherwise unremarkable.  Musculoskeletal: No worrisome lytic or sclerotic lesions. There is osseous bridging across the left sacroiliac joint.  IMPRESSION: 1. No evidence of metastatic disease. 2. Coronary artery calcification.   Electronically Signed  By: MLorin PicketM.D.  On: 01/21/2016 16:34    ASSESSMENT and THERAPY PLAN:   Squamous cell carcinoma of the head and  neck, stage IV (L neck) Cisplatin/5-FU and XRT, finished XRT 12/18/2014  Prostate Cancer, definitive XRT  60 year old male with squamous cell carcinoma of the head and neck. He finished definitive therapy in December 2015. He is doing well. Weight is stable. I recommended ongoing observation. We will see him back in several months, in regards to his prostate cancer he is followed by urology.  I reviewed the patient's TSH levels.  He is scheduled to follow up with Dr. Diona Fanti of [facility] tomorrow, 3/21.  He will return for routine follow up in 6 months with repeat CBC and TSH check. In the interim, he will follow up with Dr. Isidore Moos of radiation oncology in June.  NCCN guidelines for surveillance of Head and Neck cancer recommends:  A. H+P every 1-3 months for year 1  B. H+P every 2-6 months for year 2  C. H+P every 4-8 months for years 3-5  D. H+P every year for years greater than 5  E. Imaging only as clinically indicated following baseline imaging study within 6 months of completion of therapy.   F. Chest imaging as  clinically indicated for patients with smoking history.  G. Further re-imaging as indicated based on worrisome or equivocal signs/symptoms, smoking history, and areas inaccessible to clinical examination.  H. Routine annual imaging may be indicated in areas difficult to visualize on exam.  I. TSH every 6-12 months if neck irradiated.  J. Dental evaluation   1. Recommended for oral cavity and sites exposed to significant intraoral radiation treatment.  K. Consider EBV DNA monitoring for nasopharyngeal cancer (Category 2B).  L. Supportive care and rehabilitation   1. Speech/hearing and swallowing evaluation and rehabilitation as clinically indicated.   2. Nutritional evaluation and rehabilitation as clinically indicated until nutritional status is stabilized.   3. Ongoing surveillance for depression   4. Smoking cessation and alcohol counseling as clinically indicated.  M. For response assessment immediately after chemoradiation or RT (see FOLL-A 2 of 2).  N. Integration of survivorship care and care plan within 1 year, complementary to ongoing involvement from a head and neck oncologist.  All questions were answered. The patient knows to call the clinic with any problems, questions or concerns. We can certainly see the patient much sooner if necessary.   This document serves as a record of services personally performed by Ancil Linsey, MD. It was created on her behalf by Arlyce Harman, a trained medical scribe. The creation of this record is based on the scribe's personal observations and the provider's statements to them. This document has been checked and approved by the attending provider.  I have reviewed the above documentation for accuracy and completeness, and I agree with the above. This note was electronically signed.  Kelby Fam. Hensley Aziz, MD 03/17/2016

## 2016-03-18 ENCOUNTER — Ambulatory Visit (INDEPENDENT_AMBULATORY_CARE_PROVIDER_SITE_OTHER): Payer: Commercial Managed Care - HMO | Admitting: Urology

## 2016-03-18 DIAGNOSIS — C61 Malignant neoplasm of prostate: Secondary | ICD-10-CM

## 2016-05-12 ENCOUNTER — Encounter (HOSPITAL_COMMUNITY): Payer: Self-pay

## 2016-05-12 ENCOUNTER — Encounter (HOSPITAL_COMMUNITY): Payer: Commercial Managed Care - HMO | Attending: Hematology

## 2016-05-12 VITALS — BP 119/68 | HR 91 | Temp 97.6°F | Resp 18

## 2016-05-12 DIAGNOSIS — R590 Localized enlarged lymph nodes: Secondary | ICD-10-CM | POA: Insufficient documentation

## 2016-05-12 DIAGNOSIS — R911 Solitary pulmonary nodule: Secondary | ICD-10-CM | POA: Insufficient documentation

## 2016-05-12 DIAGNOSIS — C770 Secondary and unspecified malignant neoplasm of lymph nodes of head, face and neck: Secondary | ICD-10-CM | POA: Insufficient documentation

## 2016-05-12 DIAGNOSIS — C103 Malignant neoplasm of posterior wall of oropharynx: Secondary | ICD-10-CM | POA: Insufficient documentation

## 2016-05-12 DIAGNOSIS — Z8546 Personal history of malignant neoplasm of prostate: Secondary | ICD-10-CM | POA: Insufficient documentation

## 2016-05-12 DIAGNOSIS — C760 Malignant neoplasm of head, face and neck: Secondary | ICD-10-CM | POA: Insufficient documentation

## 2016-05-12 DIAGNOSIS — Z452 Encounter for adjustment and management of vascular access device: Secondary | ICD-10-CM

## 2016-05-12 DIAGNOSIS — Z95828 Presence of other vascular implants and grafts: Secondary | ICD-10-CM

## 2016-05-12 MED ORDER — HEPARIN SOD (PORK) LOCK FLUSH 100 UNIT/ML IV SOLN
500.0000 [IU] | Freq: Once | INTRAVENOUS | Status: AC
  Administered 2016-05-12: 500 [IU] via INTRAVENOUS

## 2016-05-12 MED ORDER — SODIUM CHLORIDE 0.9% FLUSH
10.0000 mL | INTRAVENOUS | Status: DC | PRN
  Administered 2016-05-12: 10 mL via INTRAVENOUS
  Filled 2016-05-12: qty 10

## 2016-05-12 NOTE — Patient Instructions (Signed)
[place] at [facility] Discharge Instructions  RECOMMENDATIONS MADE BY THE CONSULTANT AND ANY TEST RESULTS WILL BE SENT TO YOUR REFERRING PHYSICIAN.  Port flush today Follow up as scheduled  Thank you for choosing [place] at [facility] to provide your oncology and hematology care.  To afford each patient quality time with our provider, please arrive at least 15 minutes before your scheduled appointment time.   Beginning January 23rd 2017 lab work for the [facility] will be done in the  Main lab at [facility] on 1st floor. If you have a lab appointment with the [facility] please come in thru the  Main Entrance and check in at the main information desk  You need to re-schedule your appointment should you arrive 10 or more minutes late.  We strive to give you quality time with our providers, and arriving late affects you and other patients whose appointments are after yours.  Also, if you no show three or more times for appointments you may be dismissed from the clinic at the providers discretion.     Again, thank you for choosing [facility].  Our hope is that these requests will decrease the amount of time that you wait before being seen by our physicians.       _____________________________________________________________  Should you have questions after your visit to [facility], please contact our office at [phone] between the hours of 8:30 a.m. and 4:30 p.m.  Voicemails left after 4:30 p.m. will not be returned until the following business day.  For prescription refill requests, have your pharmacy contact our office.         Resources For Cancer Patients and their Caregivers ? American Cancer Society: Can assist with transportation, wigs, general needs, runs Look Good Feel Better.        [phone] ? Cancer Care: Provides financial assistance, online support groups, medication/co-pay  assistance.  1-800-813-HOPE 408-526-6771) ? [place] Assists [place] cancer patients and their families through emotional , educational and financial support.  [phone] ? [place] DSS Where to apply for food stamps, Medicaid and utility assistance. (928)701-2561 ? RCATS: Transportation to medical appointments. [phone] ? Social Security Administration: May apply for disability if have a Stage IV cancer. 5736141961 [phone] ? [employer], Disability and Transit Services: Assists with nutrition, care and transit needs. [place] Support Programs: @10RELATIVEDAYS @ > Cancer Support Group  2nd Tuesday of the month 1pm-2pm, Journey Room  > Creative Journey  3rd Tuesday of the month 1130am-1pm, Journey Room  > Look Good Feel Better  1st Wednesday of the month 10am-12 noon, Journey Room (Call [place] to register [phone])

## 2016-05-12 NOTE — Progress Notes (Signed)
Brandon Hall presented for Portacath access and flush.  Proper placement of portacath confirmed by CXR.  Portacath located right chest wall accessed with  H 20 needle.  Good blood return present. Portacath flushed with 20ml NS and 500U/5ml Heparin and needle removed intact.  Procedure tolerated well and without incident.    

## 2016-05-21 ENCOUNTER — Other Ambulatory Visit: Payer: Self-pay | Admitting: Family Medicine

## 2016-05-22 NOTE — Telephone Encounter (Signed)
Vitamin D level was last checked last August and it was in the therapeutic range so he does not need this but I would recommend him taking 2000 units per day as a maintenance dose

## 2016-06-23 ENCOUNTER — Ambulatory Visit (INDEPENDENT_AMBULATORY_CARE_PROVIDER_SITE_OTHER): Payer: Commercial Managed Care - HMO | Admitting: Family Medicine

## 2016-06-23 ENCOUNTER — Encounter: Payer: Self-pay | Admitting: Family Medicine

## 2016-06-23 VITALS — BP 105/71 | HR 89 | Temp 97.8°F | Ht 72.0 in | Wt 133.0 lb

## 2016-06-23 DIAGNOSIS — E039 Hypothyroidism, unspecified: Secondary | ICD-10-CM | POA: Diagnosis not present

## 2016-06-23 DIAGNOSIS — E559 Vitamin D deficiency, unspecified: Secondary | ICD-10-CM | POA: Diagnosis not present

## 2016-06-23 DIAGNOSIS — I10 Essential (primary) hypertension: Secondary | ICD-10-CM

## 2016-06-23 DIAGNOSIS — E785 Hyperlipidemia, unspecified: Secondary | ICD-10-CM | POA: Diagnosis not present

## 2016-06-23 MED ORDER — VITAMIN D (ERGOCALCIFEROL) 1.25 MG (50000 UNIT) PO CAPS: ORAL_CAPSULE | ORAL | Status: DC

## 2016-06-23 NOTE — Progress Notes (Signed)
Subjective:    Patient ID: Brandon Hall, male    DOB: [birth year], 60 y.o.   MRN: [id number]  HPI Pt here for follow up and management of chronic medical problems which includes hypothyroid, hyperlipidemia and hypertension. He is taking medications regularly.   He has no complaints today. Labs were checked in February and March and everything is at goal including thyroid lipids.     Patient Active Problem List   Diagnosis Date Noted  . Vitamin D deficiency 02/08/2015  . Squamous cell carcinoma of head and neck (HCC) 07/12/2014  . History of prostate cancer 07/12/2014  . Shingles rash 02/03/2014  . Abnormal transaminases 10/02/2013  . Hypothyroid 10/02/2013  . Blind right eye 09/30/2013  . Dyslipidemia 09/30/2013  . Hypertension   . Graves disease    Outpatient Encounter Prescriptions as of 06/23/2016  Medication Sig  . hydrochlorothiazide (HYDRODIURIL) 12.5 MG tablet Take 1 tablet (12.5 mg total) by mouth daily.  Marland Kitchen levothyroxine (SYNTHROID, LEVOTHROID) 100 MCG tablet Take 1 tablet (100 mcg total) by mouth daily.  . pravastatin (PRAVACHOL) 40 MG tablet Take 1 tablet (40 mg total) by mouth daily.  . Vitamin D, Ergocalciferol, (DRISDOL) 50000 units CAPS capsule TAKE 1 CAPSULE (50,000 UNITS TOTAL) EVERY 7 DAYS.   No facility-administered encounter medications on file as of 06/23/2016.     Review of Systems  Constitutional: Negative.   HENT: Negative.   Eyes: Negative.   Respiratory: Negative.   Cardiovascular: Negative.   Gastrointestinal: Negative.   Endocrine: Negative.   Genitourinary: Negative.   Musculoskeletal: Negative.   Skin: Negative.   Allergic/Immunologic: Negative.   Neurological: Negative.   Hematological: Negative.   Psychiatric/Behavioral: Negative.        Objective:   Physical Exam  Constitutional: He is oriented to person, place, and time. He appears well-developed and well-nourished.  Cardiovascular: Normal rate, regular rhythm and normal heart  sounds.   Pulmonary/Chest: Effort normal and breath sounds normal.  Neurological: He is alert and oriented to person, place, and time.  Psychiatric: He has a normal mood and affect. His behavior is normal.   BP 105/71 mmHg  Pulse 89  Temp(Src) 97.8 F (36.6 C) (Oral)  Ht 6' (1.829 m)  Wt 133 lb (60.328 kg)  BMI 18.03 kg/m2        Assessment & Plan:  1. Hyperlipemia LDL cholesterol at 67 4 months ago. Continue pravastatin  2. Essential hypertension Pressures are well controlled on a diuretic  3. Vitamin D deficiency We'll continue maintenance of 50,000 once a week  4. Hypothyroidism, unspecified hypothyroidism type Last TSH 4 months ago was in the therapeutic range  Frederica Kuster MD

## 2016-06-23 NOTE — Patient Instructions (Signed)
Medicare Annual Wellness Visit  [facility] and the medical providers at [facility] strive to bring you the best medical care.  In doing so we not only want to address your current medical conditions and concerns but also to detect new conditions early and prevent illness, disease and health-related problems.    Medicare offers a yearly Wellness Visit which allows our clinical staff to assess your need for preventative services including immunizations, lifestyle education, counseling to decrease risk of preventable diseases and screening for fall risk and other medical concerns.    This visit is provided free of charge (no copay) for all Medicare recipients. The clinical pharmacists at [facility] have begun to conduct these Wellness Visits which will also include a thorough review of all your medications.    As you primary medical provider recommend that you make an appointment for your Annual Wellness Visit if you have not done so already this year.  You may set up this appointment before you leave today or you may call back ([phone]) and schedule an appointment.  Please make sure when you call that you mention that you are scheduling your Annual Wellness Visit with the clinical pharmacist so that the appointment may be made for the proper length of time.     Continue current medications. Continue good therapeutic lifestyle changes which include good diet and exercise. Fall precautions discussed with patient. If an FOBT was given today- please return it to our front desk. If you are over 50 years old - you may need Prevnar 13 or the adult Pneumonia vaccine.  **Flu shots are available--- please call and schedule a FLU-CLINIC appointment**  After your visit with us today you will receive a survey in the mail or online from [employer] regarding your care with us. Please take a moment to fill this out. Your feedback is very  important to us as you can help us better understand your patient needs as well as improve your experience and satisfaction. WE CARE ABOUT YOU!!!    

## 2016-06-24 DIAGNOSIS — Z08 Encounter for follow-up examination after completed treatment for malignant neoplasm: Secondary | ICD-10-CM | POA: Diagnosis not present

## 2016-06-24 DIAGNOSIS — C760 Malignant neoplasm of head, face and neck: Secondary | ICD-10-CM | POA: Diagnosis not present

## 2016-06-24 DIAGNOSIS — Z8589 Personal history of malignant neoplasm of other organs and systems: Secondary | ICD-10-CM | POA: Diagnosis not present

## 2016-07-07 ENCOUNTER — Encounter (HOSPITAL_COMMUNITY): Payer: Commercial Managed Care - HMO | Attending: Hematology & Oncology

## 2016-07-07 ENCOUNTER — Encounter (HOSPITAL_COMMUNITY): Payer: Self-pay

## 2016-07-07 VITALS — BP 145/70 | HR 75 | Temp 98.3°F | Resp 18

## 2016-07-07 DIAGNOSIS — C760 Malignant neoplasm of head, face and neck: Secondary | ICD-10-CM

## 2016-07-07 DIAGNOSIS — Z95828 Presence of other vascular implants and grafts: Secondary | ICD-10-CM

## 2016-07-07 DIAGNOSIS — Z452 Encounter for adjustment and management of vascular access device: Secondary | ICD-10-CM | POA: Diagnosis not present

## 2016-07-07 MED ORDER — SODIUM CHLORIDE 0.9% FLUSH
10.0000 mL | INTRAVENOUS | Status: DC | PRN
  Administered 2016-07-07: 10 mL via INTRAVENOUS
  Filled 2016-07-07: qty 10

## 2016-07-07 MED ORDER — HEPARIN SOD (PORK) LOCK FLUSH 100 UNIT/ML IV SOLN
INTRAVENOUS | Status: AC
  Filled 2016-07-07: qty 5

## 2016-07-07 MED ORDER — HEPARIN SOD (PORK) LOCK FLUSH 100 UNIT/ML IV SOLN
500.0000 [IU] | Freq: Once | INTRAVENOUS | Status: AC
  Administered 2016-07-07: 500 [IU] via INTRAVENOUS

## 2016-07-07 NOTE — Progress Notes (Signed)
Brandon Hall presented for Portacath access and flush.  Proper placement of portacath confirmed by CXR.  Portacath located right chest wall accessed with  H 20 needle.  Good blood return present. Portacath flushed with 20ml NS and 500U/5ml Heparin and needle removed intact.  Procedure tolerated well and without incident.    

## 2016-07-07 NOTE — Patient Instructions (Signed)
[place] at [facility] Discharge Instructions  RECOMMENDATIONS MADE BY THE CONSULTANT AND ANY TEST RESULTS WILL BE SENT TO YOUR REFERRING PHYSICIAN.  Port flush today Port flush every 8 weeks  Follow up as scheduled  Please call the clinic if you have any questions or concerns   Thank you for choosing [place] at [facility] to provide your oncology and hematology care.  To afford each patient quality time with our provider, please arrive at least 15 minutes before your scheduled appointment time.   Beginning January 23rd 2017 lab work for the [facility] will be done in the  Main lab at [facility] on 1st floor. If you have a lab appointment with the [facility] please come in thru the  Main Entrance and check in at the main information desk  You need to re-schedule your appointment should you arrive 10 or more minutes late.  We strive to give you quality time with our providers, and arriving late affects you and other patients whose appointments are after yours.  Also, if you no show three or more times for appointments you may be dismissed from the clinic at the providers discretion.     Again, thank you for choosing [facility].  Our hope is that these requests will decrease the amount of time that you wait before being seen by our physicians.       _____________________________________________________________  Should you have questions after your visit to [facility], please contact our office at [phone] between the hours of 8:30 a.m. and 4:30 p.m.  Voicemails left after 4:30 p.m. will not be returned until the following business day.  For prescription refill requests, have your pharmacy contact our office.         Resources For Cancer Patients and their Caregivers ? American Cancer Society: Can assist with transportation, wigs, general needs, runs Look Good Feel Better.         732 040 4262 ? Cancer Care: Provides financial assistance, online support groups, medication/co-pay assistance.  1-800-813-HOPE (814)341-8690) ? [place] Assists [place] cancer patients and their families through emotional , educational and financial support.  2121901957 ? [place] DSS Where to apply for food stamps, Medicaid and utility assistance. 506-577-9933 ? [employer]: Transportation to medical appointments. 321-869-9646 ? Social Security Administration: May apply for disability if have a Stage IV cancer. (614)746-9378 2893767612 ? [employer], Disability and Transit Services: Assists with nutrition, care and transit needs. [place] Support Programs: @10RELATIVEDAYS @ > Cancer Support Group  2nd Tuesday of the month 1pm-2pm, Journey Room  > Creative Journey  3rd Tuesday of the month 1130am-1pm, Journey Room  > Look Good Feel Better  1st Wednesday of the month 10am-12 noon, Journey Room (Call [facility] to register [phone])

## 2016-07-22 ENCOUNTER — Ambulatory Visit (INDEPENDENT_AMBULATORY_CARE_PROVIDER_SITE_OTHER): Payer: Commercial Managed Care - HMO | Admitting: Urology

## 2016-07-22 DIAGNOSIS — R9721 Rising PSA following treatment for malignant neoplasm of prostate: Secondary | ICD-10-CM

## 2016-07-22 DIAGNOSIS — C61 Malignant neoplasm of prostate: Secondary | ICD-10-CM | POA: Diagnosis not present

## 2016-09-03 ENCOUNTER — Encounter (HOSPITAL_COMMUNITY): Payer: Commercial Managed Care - HMO

## 2016-09-08 ENCOUNTER — Encounter (HOSPITAL_COMMUNITY): Payer: Commercial Managed Care - HMO

## 2016-09-17 ENCOUNTER — Encounter (HOSPITAL_COMMUNITY): Payer: Commercial Managed Care - HMO

## 2016-09-17 ENCOUNTER — Encounter (HOSPITAL_COMMUNITY): Payer: Commercial Managed Care - HMO | Attending: Hematology & Oncology | Admitting: Hematology & Oncology

## 2016-09-17 ENCOUNTER — Encounter (HOSPITAL_COMMUNITY): Payer: Self-pay | Admitting: Hematology & Oncology

## 2016-09-17 VITALS — BP 139/71 | HR 67 | Temp 98.7°F | Resp 16 | Wt 135.4 lb

## 2016-09-17 DIAGNOSIS — C760 Malignant neoplasm of head, face and neck: Secondary | ICD-10-CM

## 2016-09-17 DIAGNOSIS — E038 Other specified hypothyroidism: Secondary | ICD-10-CM

## 2016-09-17 DIAGNOSIS — Z0000 Encounter for general adult medical examination without abnormal findings: Secondary | ICD-10-CM

## 2016-09-17 DIAGNOSIS — Z23 Encounter for immunization: Secondary | ICD-10-CM

## 2016-09-17 DIAGNOSIS — Z8546 Personal history of malignant neoplasm of prostate: Secondary | ICD-10-CM | POA: Diagnosis not present

## 2016-09-17 DIAGNOSIS — Z95828 Presence of other vascular implants and grafts: Secondary | ICD-10-CM

## 2016-09-17 LAB — COMPREHENSIVE METABOLIC PANEL
ALBUMIN: 4.1 g/dL (ref 3.5–5.0)
ALK PHOS: 62 U/L (ref 38–126)
ALT: 10 U/L — AB (ref 17–63)
AST: 19 U/L (ref 15–41)
Anion gap: 9 (ref 5–15)
BILIRUBIN TOTAL: 0.4 mg/dL (ref 0.3–1.2)
BUN: 18 mg/dL (ref 6–20)
CALCIUM: 9.1 mg/dL (ref 8.9–10.3)
CO2: 27 mmol/L (ref 22–32)
CREATININE: 0.96 mg/dL (ref 0.61–1.24)
Chloride: 102 mmol/L (ref 101–111)
GFR calc Af Amer: >60 mL/min (ref >60)
GFR calc non Af Amer: >60 mL/min (ref >60)
GLUCOSE: 98 mg/dL (ref 65–99)
Potassium: 3.5 mmol/L (ref 3.5–5.1)
Sodium: 138 mmol/L (ref 135–145)
TOTAL PROTEIN: 7.1 g/dL (ref 6.5–8.1)

## 2016-09-17 LAB — CBC WITH DIFFERENTIAL/PLATELET
BASOS ABS: 0 10*3/uL (ref 0.0–0.1)
BASOS PCT: 0 %
Eosinophils Absolute: 0 10*3/uL (ref 0.0–0.7)
Eosinophils Relative: 1 %
HEMATOCRIT: 42.8 % (ref 39.0–52.0)
HEMOGLOBIN: 13.7 g/dL (ref 13.0–17.0)
LYMPHS PCT: 28 %
Lymphs Abs: 1.1 10*3/uL (ref 0.7–4.0)
MCH: 26.7 pg (ref 26.0–34.0)
MCHC: 32 g/dL (ref 30.0–36.0)
MCV: 83.4 fL (ref 78.0–100.0)
Monocytes Absolute: 0.4 10*3/uL (ref 0.1–1.0)
Monocytes Relative: 10 %
NEUTROS ABS: 2.4 10*3/uL (ref 1.7–7.7)
NEUTROS PCT: 61 %
Platelets: 179 10*3/uL (ref 150–400)
RBC: 5.13 MIL/uL (ref 4.22–5.81)
RDW: 15.8 % — ABNORMAL HIGH (ref 11.5–15.5)
WBC: 4 10*3/uL (ref 4.0–10.5)

## 2016-09-17 LAB — TSH: TSH: 0.816 u[IU]/mL (ref 0.350–4.500)

## 2016-09-17 MED ORDER — SODIUM CHLORIDE 0.9% FLUSH
20.0000 mL | INTRAVENOUS | Status: DC | PRN
  Administered 2016-09-17: 20 mL via INTRAVENOUS
  Filled 2016-09-17: qty 20

## 2016-09-17 MED ORDER — INFLUENZA VAC SPLIT QUAD 0.5 ML IM SUSY
0.5000 mL | PREFILLED_SYRINGE | Freq: Once | INTRAMUSCULAR | Status: AC
  Administered 2016-09-17: 0.5 mL via INTRAMUSCULAR
  Filled 2016-09-17: qty 0.5

## 2016-09-17 MED ORDER — HEPARIN SOD (PORK) LOCK FLUSH 100 UNIT/ML IV SOLN
500.0000 [IU] | Freq: Once | INTRAVENOUS | Status: AC
  Administered 2016-09-17: 500 [IU] via INTRAVENOUS
  Filled 2016-09-17: qty 5

## 2016-09-17 NOTE — Progress Notes (Signed)
Brandon Honour, MD [address] [place] [address]  Squamous cell carcinoma of the head and neck, stage IV (L neck) Cisplatin/5-FU and XRT, finished XRT 12/18/2014  Prostate Cancer, definitive XRT  Oncology History   Secondary and unspecified malignant neoplasm of lymph nodes of head, face, and neck, likely nasopharynx in origin   Primary site: Pharynx - Nasopharynx   Staging method: AJCC 7th Edition   Clinical: Stage II (T1, N1, M0)   Summary: Stage II (T1, N1, M0)       Squamous cell carcinoma of head and neck ([place])   05/05/2014 Imaging    CT scan of the neck show enlarged left lymph node measure less than 6 cm.      05/25/2014 Pathology Results    [phone] is positive for squamous cell carcinoma.      05/25/2014 Procedure    Lymph node biopsy of the left neck came back squamous cell carcinoma.      06/20/2014 Imaging    PET CT scan show no evidence of distant metastatic disease.      06/29/2014 Surgery    Laryngoscopy and random biopsy of the nasopharynx was negative for malignancy.      07/27/2014 - 09/27/2014 Chemotherapy    Cisplatin + 5FU (days 1-5) every 28 days 4 cycles      11/06/2014 - 12/25/2014 Radiation Therapy    In [place] with Dr. Isidore Moos      04/25/2015 Imaging    PET - Rest[facility].  No evidence of hypermetabolic recurrent or metastatic disease.      05/17/2015 Pathology Results    [id number]:  p16 (HPV) negative.        CURRENT THERAPY: Observation  INTERVAL HISTORY: Brandon Hall 60 y.o. male returns for follow-up of head and neck cancer. He is doing remarkably well.   Brandon Hall is accompanied by his sister. I personally reviewed and went over laboratory studies with the patient.  He last saw Dr. Isidore Moos in June and is scheduled to follow up with her in December.  He has been eating and sleeping well. He has no concerns or complaints at this time. Denies sore throat or trouble swallowing. His bowels are all right. Denies swelling in  his legs. He describes his appetite as good. Weight is stable to improved.  He continues on 100 mcg of synthroid daily.   MEDICAL HISTORY: Past Medical History:  Diagnosis Date  . Arthritis   . Blindness of right eye   . Cancer ([place])    pharyngeal ca  . Cataract, secondary obscuring vision    LEFT  . Graves disease   . History of shingles    RASH  02-03-2014  PER PCP RESOLVING  . Hypertension    NO MEDS ,  MONITORED BY PCP  . Hypothyroidism, postradioiodine therapy   . Prostate cancer ([place])    DX  SEVERAL  YRS   AGO  WITH RADIATION THERAPY  . Squamous cell carcinoma of head and neck ([place]) 07/12/2014  . 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     has No Known Allergies.  We administered heparin lock flush and sodium chloride.  SURGICAL HISTORY: Past Surgical History:  Procedure Laterality Date  . COLONOSCOPY  10/20/2011   Procedure: COLONOSCOPY;  Surgeon: Daneil Dolin, MD;  Location[facility];  Service: Endoscopy;  Laterality: N/A;  1:00 pm  . CRYOABLATION N/A 04/07/2014   Procedure: SALVAGE CRYO ABLATION PROSTATE;  Surgeon: Lowella Bandy, MD;  [facility];  Service: Urology;  Laterality: N/A;  . LARYNGOSCOPY    . LYMPH NODE BIOPSY      SOCIAL HISTORY: Social History   Social History  . Marital status: Married    Spouse name: N/A  . Number of children: N/A  . Years of education: N/A   Occupational History  . Not on file.   Social History Main Topics  . Smoking status: Former Smoker    Packs/day: 1.00    Years: 40.00    Types: Cigarettes    Quit date: 09/29/2013  . Smokeless tobacco: Never Used  . Alcohol use No  . Drug use: No  . Sexual activity: Not on file   Other Topics Concern   . Not on file   Social History Narrative  . No narrative on file    FAMILY HISTORY: Family History  Problem Relation Age of Onset  . Colon cancer Father   . Hypertension Mother     Review of Systems  Constitutional: Negative for fever, chills, weight loss and malaise/fatigue.  HENT: Negative for congestion, hearing loss, nosebleeds, sore throat and tinnitus.   Eyes: Negative for blurred vision, double vision, pain and discharge.  Respiratory: Negative for cough, hemoptysis, sputum production, shortness of breath and wheezing.   Cardiovascular: Negative for chest pain, palpitations, claudication, leg swelling and PND.  Gastrointestinal: Negative for heartburn, nausea, vomiting, abdominal pain, diarrhea, constipation, blood in stool and melena.  Genitourinary: Negative for dysuria, urgency, frequency and hematuria.  Musculoskeletal: Negative for myalgias, joint pain and falls.  Skin: Negative for itching and rash.  Neurological: Negative for dizziness, tingling, tremors, sensory change, speech change, focal weakness, seizures, loss of consciousness, weakness and headaches.  Endo/Heme/Allergies: Does not bruise/bleed easily.  Psychiatric/Behavioral: Negative for depression, suicidal ideas, memory loss and substance abuse. The patient is not nervous/anxious and does not have insomnia.   14 point review of systems was performed and is negative except as detailed under history of present illness and above  PHYSICAL EXAMINATION  ECOG PERFORMANCE STATUS: 0 - Asymptomatic  Vitals:   09/17/16 1355  BP: 139/71  Pulse: 67  Resp: 16  Temp: 98.7 F (37.1 C)    Physical Exam  Constitutional: He is oriented to person, place, and time.  THIN  HENT:  Head: Normocephalic and atraumatic. Abnormal R eye, chronic  Mild Lymphedema under his chin Nose: Nose normal.  Mouth/Throat: Oropharynx is clear and moist. No oropharyngeal exudate.   Radiation changes to neck Eyes: Abnormal R eye  (chronic) no discharge noted . No scleral icterus.  Neck: Neck supple. No tracheal deviation present. No thyromegaly present.  Cardiovascular: Normal rate, regular rhythm and normal heart sounds.  Exam reveals no gallop and no friction rub.   No murmur heard. Pulmonary/Chest: Effort normal and breath sounds normal. He has no wheezes. He has no rales.   R port a cath  Abdominal: Soft. Bowel sounds are normal. He exhibits no distension and no mass. There is no tenderness. There is no rebound and no guarding.  Musculoskeletal: Normal range of motion. He exhibits no LE edema.  Lymphadenopathy:    He has no cervical adenopathy.  Neurological: He is alert and oriented to person, place, and time. He has normal reflexes. No cranial nerve  deficit. Gait normal. Coordination normal.  Skin: Skin is warm and dry. No rash noted.  Psychiatric: Mood, memory, affect and judgment normal.  Nursing note and vitals reviewed.   LABORATORY DATA: I have reviewed the data as listed Results for MCKOY, BHAKTA (MRN [id number]) as of 09/26/2016 23:23  Ref. Range 09/17/2016 14:17  Sodium Latest Ref Range: 135 - 145 mmol/L 138  Potassium Latest Ref Range: 3.5 - 5.1 mmol/L 3.5  Chloride Latest Ref Range: 101 - 111 mmol/L 102  CO2 Latest Ref Range: 22 - 32 mmol/L 27  BUN Latest Ref Range: 6 - 20 mg/dL 18  Creatinine Latest Ref Range: 0.61 - 1.24 mg/dL 0.96  Calcium Latest Ref Range: 8.9 - 10.3 mg/dL 9.1  EGFR (Non-African Amer.) Latest Ref Range: >60 mL/min >60  EGFR (African American) Latest Ref Range: >60 mL/min >60  Glucose Latest Ref Range: 65 - 99 mg/dL 98  Anion gap Latest Ref Range: 5 - 15  9  Alkaline Phosphatase Latest Ref Range: 38 - 126 U/L 62  Albumin Latest Ref Range: 3.5 - 5.0 g/dL 4.1  AST Latest Ref Range: 15 - 41 U/L 19  ALT Latest Ref Range: 17 - 63 U/L 10 (L)  Total Protein Latest Ref Range: 6.5 - 8.1 g/dL 7.1  Total Bilirubin Latest Ref Range: 0.3 - 1.2 mg/dL 0.4  WBC Latest Ref Range: 4.0 -  10.5 K/uL 4.0  RBC Latest Ref Range: 4.22 - 5.81 MIL/uL 5.13  Hemoglobin Latest Ref Range: 13.0 - 17.0 g/dL 13.7  HCT Latest Ref Range: 39.0 - 52.0 % 42.8  MCV Latest Ref Range: 78.0 - 100.0 fL 83.4  MCH Latest Ref Range: 26.0 - 34.0 pg 26.7  MCHC Latest Ref Range: 30.0 - 36.0 g/dL 32.0  RDW Latest Ref Range: 11.5 - 15.5 % 15.8 (H)  Platelets Latest Ref Range: 150 - 400 K/uL 179  Neutrophils Latest Units: % 61  Lymphocytes Latest Units: % 28  Monocytes Relative Latest Units: % 10  Eosinophil Latest Units: % 1  Basophil Latest Units: % 0  NEUT# Latest Ref Range: 1.7 - 7.7 K/uL 2.4  Lymphocyte # Latest Ref Range: 0.7 - 4.0 K/uL 1.1  Monocyte # Latest Ref Range: 0.1 - 1.0 K/uL 0.4  Eosinophils Absolute Latest Ref Range: 0.0 - 0.7 K/uL 0.0  Basophils Absolute Latest Ref Range: 0.0 - 0.1 K/uL 0.0  TSH Latest Ref Range: 0.350 - 4.500 uIU/mL 0.816     RADIOLOGY I have personally reviewed the radiological images as listed and agreed with the findings in the report. Study Result     CLINICAL DATA: Prostate cancer, pharyngeal cancer.  EXAM: CT ABDOMEN AND PELVIS WITH CONTRAST  TECHNIQUE: Multidetector CT imaging of the abdomen and pelvis was performed using the standard protocol following bolus administration of intravenous contrast.  CONTRAST: 163m OMNIPAQUE IOHEXOL 300 MG/ML SOLN  COMPARISON: PET 04/25/2015 and CT abdomen pelvis 09/19/2015.  FINDINGS: Lower chest: 5 mm right middle lobe nodule is unchanged from 09/18/2014. Heart size normal. Coronary artery calcification. No pericardial or pleural effusion.  Hepatobiliary: Low-attenuation lesions in the liver measure up to 8 mm in the left hepatic lobe, unchanged and possibly hemangiomas based on prior imaging. Liver and gallbladder are otherwise unremarkable. No biliary ductal dilatation.  Pancreas: Negative.  Spleen: 8 mm low-attenuation lesion in the spleen is unchanged and likely  benign.  Adrenals/Urinary Tract: Adrenal glands are unremarkable. Numerous low-attenuation lesions are seen in the kidneys bilaterally, measuring up to 2.8 cm on the  right, likely cysts although definitive characterization is difficult on some lesions due to size or Hounsfield unit measurements above 10. Ureters are decompressed. Bladder is unremarkable.  Stomach/Bowel: Stomach, small bowel and colon are unremarkable. Appendix is not readily visualized.  Vascular/Lymphatic: Atherosclerotic calcification of the arterial vasculature without abdominal aortic aneurysm. No pathologically enlarged lymph nodes.  Reproductive: Prostate is atrophic or absent.  Other: No free fluid. Mesenteries and peritoneum are otherwise unremarkable.  Musculoskeletal: No worrisome lytic or sclerotic lesions. There is osseous bridging across the left sacroiliac joint.  IMPRESSION: 1. No evidence of metastatic disease. 2. Coronary artery calcification.   Electronically Signed  By: Lorin Picket M.D.  On: 01/21/2016 16:34    ASSESSMENT and THERAPY PLAN:   Squamous cell carcinoma of the head and neck, stage IV (L neck) Cisplatin/5-FU and XRT, finished XRT 12/18/2014 Hypothyroidism Prostate Cancer, definitive XRT  60 year old male with squamous cell carcinoma of the head and neck. He finished definitive therapy in December 2015. He is doing well. Weight is stable. I recommended ongoing observation. We will see him back in several months, in regards to his prostate cancer he is followed by urology.  I reviewed the patient's TSH levels. He will continue with his current dose of levothyroxine.   He will receive a flu vaccination today.  He will return for routine follow up in 6 months with repeat CBC and TSH check. In the interim, he will follow up with Dr. Isidore Moos of radiation oncology in December.   NCCN guidelines for surveillance of Head and Neck cancer recommends:  A. H+P every 1-3  months for year 1  B. H+P every 2-6 months for year 2  C. H+P every 4-8 months for years 3-5  D. H+P every year for years greater than 5  E. Imaging only as clinically indicated following baseline imaging study within 6 months of completion of therapy.   F. Chest imaging as clinically indicated for patients with smoking history.  G. Further re-imaging as indicated based on worrisome or equivocal signs/symptoms, smoking history, and areas inaccessible to clinical examination.  H. Routine annual imaging may be indicated in areas difficult to visualize on exam.  I. TSH every 6-12 months if neck irradiated.  J. Dental evaluation   1. Recommended for oral cavity and sites exposed to significant intraoral radiation treatment.  K. Consider EBV DNA monitoring for nasopharyngeal cancer (Category 2B).  L. Supportive care and rehabilitation   1. Speech/hearing and swallowing evaluation and rehabilitation as clinically indicated.   2. Nutritional evaluation and rehabilitation as clinically indicated until nutritional status is stabilized.   3. Ongoing surveillance for depression   4. Smoking cessation and alcohol counseling as clinically indicated.  M. For response assessment immediately after chemoradiation or RT (see FOLL-A 2 of 2).  N. Integration of survivorship care and care plan within 1 year, complementary to ongoing involvement from a head and neck oncologist.  All questions were answered. The patient knows to call the clinic with any problems, questions or concerns. We can certainly see the patient much sooner if necessary.   This document serves as a record of services personally performed by Ancil Linsey, MD. It was created on her behalf by Arlyce Harman, a trained medical scribe. The creation of this record is based on the scribe's personal observations and the provider's statements to them. This document has been checked and approved by the attending provider.  I have reviewed the  above documentation for accuracy and completeness, and I agree with  the above. This note was electronically signed.  Kelby Fam. Penland, MD 09/17/2016

## 2016-09-17 NOTE — Patient Instructions (Addendum)
[place] at [facility] Discharge Instructions  RECOMMENDATIONS MADE BY THE CONSULTANT AND ANY TEST RESULTS WILL BE SENT TO YOUR REFERRING PHYSICIAN.  You saw Dr. Whitney Muse today. Flu shot today. Follow up in 6 months with lab work.  Thank you for choosing [place] at [facility] to provide your oncology and hematology care.  To afford each patient quality time with our provider, please arrive at least 15 minutes before your scheduled appointment time.   Beginning January 23rd 2017 lab work for the [facility] will be done in the  Main lab at [facility] on 1st floor. If you have a lab appointment with the [place] please come in thru the  Main Entrance and check in at the main information desk  You need to re-schedule your appointment should you arrive 10 or more minutes late.  We strive to give you quality time with our providers, and arriving late affects you and other patients whose appointments are after yours.  Also, if you no show three or more times for appointments you may be dismissed from the clinic at the providers discretion.     Again, thank you for choosing [facility].  Our hope is that these requests will decrease the amount of time that you wait before being seen by our physicians.       _____________________________________________________________  Should you have questions after your visit to [facility], please contact our office at [phone] between the hours of 8:30 a.m. and 4:30 p.m.  Voicemails left after 4:30 p.m. will not be returned until the following business day.  For prescription refill requests, have your pharmacy contact our office.         Resources For Cancer Patients and their Caregivers ? American Cancer Society: Can assist with transportation, wigs, general needs, runs Look Good Feel Better.        ([phone] ? Cancer Care: Provides financial assistance,  online support groups, medication/co-pay assistance.  1-800-813-HOPE 201-448-8772) ? [place] Assists [place] cancer patients and their families through emotional , educational and financial support.  (251)208-9199 ? [place] DSS Where to apply for food stamps, Medicaid and utility assistance. 240-336-7893 ? RCATS: Transportation to medical appointments. 269-806-8731 ? Social Security Administration: May apply for disability if have a Stage IV cancer. 639-200-5613 717-389-0050 ? [employer], Disability and Transit Services: Assists with nutrition, care and transit needs. [place] Support Programs: @10RELATIVEDAYS @ > Cancer Support Group  2nd Tuesday of the month 1pm-2pm, Journey Room  > Creative Journey  3rd Tuesday of the month 1130am-1pm, Journey Room  > Look Good Feel Better  1st Wednesday of the month 10am-12 noon, Journey Room (Call [place] to register [phone])

## 2016-09-17 NOTE — Patient Instructions (Signed)
[facility] at [facility] Discharge Instructions  RECOMMENDATIONS MADE BY THE CONSULTANT AND ANY TEST RESULTS WILL BE SENT TO YOUR REFERRING PHYSICIAN.  Port flush with labs.    Thank you for choosing [facility] at [facility] to provide your oncology and hematology care.  To afford each patient quality time with our provider, please arrive at least 15 minutes before your scheduled appointment time.   Beginning January 23rd 2017 lab work for the Cancer Center will be done in the  Main lab at [facility] on 1st floor. If you have a lab appointment with the Cancer Center please come in thru the  Main Entrance and check in at the main information desk  You need to re-schedule your appointment should you arrive 10 or more minutes late.  We strive to give you quality time with our providers, and arriving late affects you and other patients whose appointments are after yours.  Also, if you no show three or more times for appointments you may be dismissed from the clinic at the providers discretion.     Again, thank you for choosing [facility].  Our hope is that these requests will decrease the amount of time that you wait before being seen by our physicians.       _____________________________________________________________  Should you have questions after your visit to [facility], please contact our office at [phone] between the hours of 8:30 a.m. and 4:30 p.m.  Voicemails left after 4:30 p.m. will not be returned until the following business day.  For prescription refill requests, have your pharmacy contact our office.         Resources For Cancer Patients and their Caregivers ? American Cancer Society: Can assist with transportation, wigs, general needs, runs Look Good Feel Better.        1-888-227-6333 ? Cancer Care: Provides financial assistance, online support groups, medication/co-pay assistance.   1-800-813-HOPE (4673) ? [facility] Assists [place] cancer patients and their families through emotional , educational and financial support.  336-427-4357 ? [place] DSS Where to apply for food stamps, Medicaid and utility assistance. 336-342-1394 ? RCATS: Transportation to medical appointments. 336-347-2287 ? Social Security Administration: May apply for disability if have a Stage IV cancer. 336-342-7796 1-800-772-1213 ? [place] Aging, Disability and Transit Services: Assists with nutrition, care and transit needs. 336-349-2343  Cancer Center Support Programs: @10RELATIVEDAYS@ > Cancer Support Group  2nd Tuesday of the month 1pm-2pm, Journey Room  > Creative Journey  3rd Tuesday of the month 1130am-1pm, Journey Room  > Look Good Feel Better  1st Wednesday of the month 10am-12 noon, Journey Room (Call American Cancer Society to register 1-800-395-5775)    

## 2016-09-17 NOTE — Progress Notes (Signed)
Brandon Hall presented for Portacath access and flush. Proper placement of portacath confirmed by CXR. Portacath located right chest wall accessed with  H 20 needle. Good blood return present.  Specimen drawn for labs.   Portacath flushed with 64ml NS and 500U/42ml Heparin and needle removed intact. Procedure without incident. Patient tolerated procedure well.

## 2016-09-17 NOTE — Progress Notes (Signed)
Brandon Hall presents today for injection per MD orders. Flu vaccine administered IM in left Upper Arm. Administration without incident. Patient tolerated well.

## 2016-09-22 ENCOUNTER — Encounter (HOSPITAL_COMMUNITY): Payer: Commercial Managed Care - HMO

## 2016-09-26 ENCOUNTER — Encounter (HOSPITAL_COMMUNITY): Payer: Self-pay | Admitting: Hematology & Oncology

## 2016-10-24 ENCOUNTER — Encounter: Payer: Self-pay | Admitting: Family Medicine

## 2016-10-24 ENCOUNTER — Ambulatory Visit (INDEPENDENT_AMBULATORY_CARE_PROVIDER_SITE_OTHER): Payer: Commercial Managed Care - HMO | Admitting: Family Medicine

## 2016-10-24 VITALS — BP 127/77 | HR 61 | Temp 97.0°F | Ht 72.0 in | Wt 137.2 lb

## 2016-10-24 DIAGNOSIS — I10 Essential (primary) hypertension: Secondary | ICD-10-CM

## 2016-10-24 DIAGNOSIS — E038 Other specified hypothyroidism: Secondary | ICD-10-CM | POA: Diagnosis not present

## 2016-10-24 DIAGNOSIS — E7800 Pure hypercholesterolemia, unspecified: Secondary | ICD-10-CM | POA: Diagnosis not present

## 2016-10-24 NOTE — Progress Notes (Signed)
Subjective:    Patient ID: Brandon Hall, male    DOB: [birth year], 60 y.o.   MRN: [id number]  HPI   Patient is here today for followup of his chronic medical conditions including hypertension, hyperlipidemia and thyroid disorder.  Patient has no other complaints today. Patient does very well. He is only on 3 prescriptions, hydrochlorothiazide for blood pressure levothyroxine for thyroid and Pravachol for lipids. Weight is stable this time he had lost some weight at last visit.  Patient Active Problem List   Diagnosis Date Noted  . Vitamin D deficiency 02/08/2015  . Squamous cell carcinoma of head and neck (HCC) 07/12/2014  . History of prostate cancer 07/12/2014  . Shingles rash 02/03/2014  . Abnormal transaminases 10/02/2013  . Hypothyroid 10/02/2013  . Blind right eye 09/30/2013  . Dyslipidemia 09/30/2013  . Hypertension   . Graves disease    Outpatient Encounter Prescriptions as of 10/24/2016  Medication Sig  . hydrochlorothiazide (HYDRODIURIL) 12.5 MG tablet Take 1 tablet (12.5 mg total) by mouth daily.  Marland Kitchen levothyroxine (SYNTHROID, LEVOTHROID) 100 MCG tablet Take 1 tablet (100 mcg total) by mouth daily.  . pravastatin (PRAVACHOL) 40 MG tablet Take 1 tablet (40 mg total) by mouth daily.  . Vitamin D, Ergocalciferol, (DRISDOL) 50000 units CAPS capsule TAKE 1 CAPSULE (50,000 UNITS TOTAL) EVERY 7 DAYS.   No facility-administered encounter medications on file as of 10/24/2016.      Review of Systems  Constitutional: Negative.   HENT: Negative.   Eyes: Negative.   Respiratory: Positive for cough.   Cardiovascular: Negative.   Gastrointestinal: Negative.   Endocrine: Negative.   Genitourinary: Negative.   Musculoskeletal: Negative.   Skin: Negative.   Allergic/Immunologic: Negative.   Neurological: Negative.   Hematological: Negative.   Psychiatric/Behavioral: Negative.        Objective:   Physical Exam  Constitutional: He is oriented to person, place, and time. He  appears well-developed and well-nourished.  Cardiovascular: Normal rate, regular rhythm and normal heart sounds.   Pulmonary/Chest: Effort normal and breath sounds normal.  Neurological: He is alert and oriented to person, place, and time.  Psychiatric: He has a normal mood and affect. His behavior is normal.     BP 127/77   Pulse 61   Temp 97 F (36.1 C) (Oral)   Ht 6' (1.829 m)   Wt 137 lb 4 oz (62.3 kg)   BMI 18.61 kg/m          Assessment & Plan:     1. Pure hypercholesterolemia As were at goal when last assessed earlier this year  2. Essential hypertension Blood pressures are well controlled on hydrochlorothiazide  3. Other specified hypothyroidism TSH was in the therapeutic level. That should be assessed today  Frederica Kuster MD

## 2016-11-18 DIAGNOSIS — R9721 Rising PSA following treatment for malignant neoplasm of prostate: Secondary | ICD-10-CM | POA: Diagnosis not present

## 2016-11-25 ENCOUNTER — Ambulatory Visit (INDEPENDENT_AMBULATORY_CARE_PROVIDER_SITE_OTHER): Payer: Commercial Managed Care - HMO | Admitting: Urology

## 2016-11-25 DIAGNOSIS — C61 Malignant neoplasm of prostate: Secondary | ICD-10-CM | POA: Diagnosis not present

## 2016-12-24 ENCOUNTER — Telehealth: Payer: Self-pay | Admitting: Family Medicine

## 2016-12-24 ENCOUNTER — Other Ambulatory Visit: Payer: Self-pay | Admitting: *Deleted

## 2016-12-24 MED ORDER — PRAVASTATIN SODIUM 40 MG PO TABS: 40.0000 mg | ORAL_TABLET | Freq: Every day | ORAL | 3 refills | Status: DC

## 2016-12-24 NOTE — Telephone Encounter (Signed)
Medication sent to pharmacy  

## 2017-01-06 ENCOUNTER — Other Ambulatory Visit: Payer: Self-pay | Admitting: Nurse Practitioner

## 2017-01-09 ENCOUNTER — Other Ambulatory Visit: Payer: Self-pay | Admitting: Family Medicine

## 2017-01-09 DIAGNOSIS — I10 Essential (primary) hypertension: Secondary | ICD-10-CM

## 2017-01-12 ENCOUNTER — Encounter (HOSPITAL_COMMUNITY): Payer: Commercial Managed Care - HMO | Attending: Hematology & Oncology

## 2017-01-12 VITALS — BP 134/81 | HR 80 | Temp 98.0°F | Resp 16

## 2017-01-12 DIAGNOSIS — C760 Malignant neoplasm of head, face and neck: Secondary | ICD-10-CM | POA: Diagnosis not present

## 2017-01-12 DIAGNOSIS — Z452 Encounter for adjustment and management of vascular access device: Secondary | ICD-10-CM | POA: Diagnosis not present

## 2017-01-12 DIAGNOSIS — Z95828 Presence of other vascular implants and grafts: Secondary | ICD-10-CM

## 2017-01-12 MED ORDER — SODIUM CHLORIDE 0.9% FLUSH
10.0000 mL | INTRAVENOUS | Status: DC | PRN
  Administered 2017-01-12: 10 mL via INTRAVENOUS
  Filled 2017-01-12: qty 10

## 2017-01-12 MED ORDER — HEPARIN SOD (PORK) LOCK FLUSH 100 UNIT/ML IV SOLN
500.0000 [IU] | Freq: Once | INTRAVENOUS | Status: AC
  Administered 2017-01-12: 500 [IU] via INTRAVENOUS

## 2017-01-12 NOTE — Patient Instructions (Signed)
[place] at [facility] Discharge Instructions  RECOMMENDATIONS MADE BY THE CONSULTANT AND ANY TEST RESULTS WILL BE SENT TO YOUR REFERRING PHYSICIAN.  You had your port flushed today. Follow up as scheduled and call if any problems.  Thank you for choosing [place] at [facility] to provide your oncology and hematology care.  To afford each patient quality time with our provider, please arrive at least 15 minutes before your scheduled appointment time.    If you have a lab appointment with the [facility] please come in thru the  Main Entrance and check in at the main information desk  You need to re-schedule your appointment should you arrive 10 or more minutes late.  We strive to give you quality time with our providers, and arriving late affects you and other patients whose appointments are after yours.  Also, if you no show three or more times for appointments you may be dismissed from the clinic at the providers discretion.     Again, thank you for choosing [facility].  Our hope is that these requests will decrease the amount of time that you wait before being seen by our physicians.       _____________________________________________________________  Should you have questions after your visit to [facility], please contact our office at [phone] between the hours of 8:30 a.m. and 4:30 p.m.  Voicemails left after 4:30 p.m. will not be returned until the following business day.  For prescription refill requests, have your pharmacy contact our office.       Resources For Cancer Patients and their Caregivers ? American Cancer Society: Can assist with transportation, wigs, general needs, runs Look Good Feel Better.        669-701-3223 ? Cancer Care: Provides financial assistance, online support groups, medication/co-pay assistance.  1-800-813-HOPE (548)496-7815) ? [place] Assists  [place] cancer patients and their families through emotional , educational and financial support.  8641777930 ? [place] DSS Where to apply for food stamps, Medicaid and utility assistance. (210) 322-8651 ? RCATS: Transportation to medical appointments. 2701362470 ? Social Security Administration: May apply for disability if have a Stage IV cancer. [phone] ? [employer], Disability and Transit Services: Assists with nutrition, care and transit needs. [place] Support Programs: @10RELATIVEDAYS @ > Cancer Support Group  2nd Tuesday of the month 1pm-2pm, Journey Room  > Creative Journey  3rd Tuesday of the month 1130am-1pm, Journey Room  > Look Good Feel Better  1st Wednesday of the month 10am-12 noon, Journey Room (Call [place] to register [phone])

## 2017-01-12 NOTE — Progress Notes (Signed)
Brandon Hall presented for Portacath access and flush.  Portacath located in right chest wall accessed with  H 20 needle.  Good blood return present. Portacath flushed with 49ml NS and 500U/65ml Heparin and needle removed intact.  Procedure tolerated well and without incident. Left facility ambulatory in stable condition accompanied by family.

## 2017-01-13 ENCOUNTER — Other Ambulatory Visit: Payer: Self-pay | Admitting: Family Medicine

## 2017-01-13 MED ORDER — VITAMIN D (ERGOCALCIFEROL) 1.25 MG (50000 UNIT) PO CAPS: ORAL_CAPSULE | ORAL | 1 refills | Status: DC

## 2017-01-13 NOTE — Telephone Encounter (Signed)
Pt requesting refill on mail order RXs Refill sent in per pt request Okayed per Dr Sabra Heck Pt has appt for check up in 03/2017

## 2017-01-16 ENCOUNTER — Other Ambulatory Visit: Payer: Self-pay | Admitting: *Deleted

## 2017-01-16 DIAGNOSIS — I10 Essential (primary) hypertension: Secondary | ICD-10-CM

## 2017-01-16 MED ORDER — VITAMIN D (ERGOCALCIFEROL) 1.25 MG (50000 UNIT) PO CAPS: ORAL_CAPSULE | ORAL | 1 refills | Status: DC

## 2017-01-16 MED ORDER — HYDROCHLOROTHIAZIDE 12.5 MG PO TABS: 12.5000 mg | ORAL_TABLET | Freq: Every day | ORAL | 3 refills | Status: DC

## 2017-01-16 MED ORDER — LEVOTHYROXINE SODIUM 100 MCG PO TABS: 100.0000 ug | ORAL_TABLET | Freq: Every day | ORAL | 3 refills | Status: DC

## 2017-02-11 ENCOUNTER — Encounter: Payer: Self-pay | Admitting: *Deleted

## 2017-03-04 NOTE — Progress Notes (Signed)
  Brandon Hall presents for follow up of radiation completed 12/25/14 to his Nasopharynx.   Pain issues, if any: He denies Using a feeding tube?: N/A Weight changes, if any:  Wt Readings from Last 3 Encounters:  03/06/17 139 lb 3.2 oz (63.1 kg)  10/24/16 137 lb 4 oz (62.3 kg)  09/17/16 135 lb 6.4 oz (61.4 kg)   Swallowing issues, if any: He denies any difficulties swallowing Smoking or chewing tobacco? He is a former smoker Using fluoride trays daily? N/A Last ENT visit was on: Dr. Wilburn Cornelia last year sometime. His niece tells me that she has called to make an appointment, but have been told that they need a referral first.  Other notable issues, if any:  He still has a porta cath in place. They were told by Dr. Whitney Muse that Dr. Isidore Moos would need to order to have it removed.  BP 133/69   Pulse 72   Temp 97.7 F (36.5 C)   Ht 6' (1.829 m)   Wt 139 lb 3.2 oz (63.1 kg)   SpO2 97% Comment: room air  BMI 18.88 kg/m

## 2017-03-06 ENCOUNTER — Encounter: Payer: Self-pay | Admitting: Radiation Oncology

## 2017-03-06 ENCOUNTER — Ambulatory Visit
Admission: RE | Admit: 2017-03-06 | Discharge: 2017-03-06 | Disposition: A | Discharge status: Home / Self Care | Payer: Medicare HMO | Source: Ambulatory Visit | Attending: Radiation Oncology | Admitting: Radiation Oncology

## 2017-03-06 DIAGNOSIS — Z923 Personal history of irradiation: Secondary | ICD-10-CM | POA: Insufficient documentation

## 2017-03-06 DIAGNOSIS — Z79899 Other long term (current) drug therapy: Secondary | ICD-10-CM | POA: Diagnosis not present

## 2017-03-06 DIAGNOSIS — C760 Malignant neoplasm of head, face and neck: Secondary | ICD-10-CM

## 2017-03-06 DIAGNOSIS — Z859 Personal history of malignant neoplasm, unspecified: Secondary | ICD-10-CM | POA: Diagnosis not present

## 2017-03-06 DIAGNOSIS — Z87891 Personal history of nicotine dependence: Secondary | ICD-10-CM | POA: Insufficient documentation

## 2017-03-06 DIAGNOSIS — Z85858 Personal history of malignant neoplasm of other endocrine glands: Secondary | ICD-10-CM | POA: Diagnosis not present

## 2017-03-06 DIAGNOSIS — Z08 Encounter for follow-up examination after completed treatment for malignant neoplasm: Secondary | ICD-10-CM | POA: Diagnosis not present

## 2017-03-06 NOTE — Progress Notes (Signed)
Radiation Oncology         [phone] ________________________________  Name: Brandon Hall MRN: [id number]  Date: 03/06/2017  DOB: [birth year]  Follow-Up Visit Note  Outpatient  CC: Frederica Kuster, MD  Osborn Coho, MD  Diagnosis and Prior Radiotherapy:    ICD-9-CM ICD-10-CM   1. 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  Narrative:  The patient returns today for routine follow-up.  Pain issues, if any: no Using a feeding tube: no Weight changes, if any:                           Swallowing issues, if any: no Smoking or chewing tobacco: former smoker; 1 ppd for 40 years quit 09/29/13 Using fluoride trays: no Last ENT visit: He was seen by Dr. Annalee Genta sometime in the last year. His niece reports she called to make another appointment but was told they need a referral. Other notable issues, if any: He still has a porta cath in place.   ALLERGIES:  has No Known Allergies.  Meds: Current Outpatient Prescriptions  Medication Sig Dispense Refill  . hydrochlorothiazide (HYDRODIURIL) 12.5 MG tablet Take 1 tablet (12.5 mg total) by mouth daily. 90 tablet 3  . levothyroxine (SYNTHROID, LEVOTHROID) 100 MCG tablet Take 1 tablet (100 mcg total) by mouth daily. 90 tablet 3  . pravastatin (PRAVACHOL) 40 MG tablet Take 1 tablet (40 mg total) by mouth daily. 90 tablet 3  . Vitamin D, Ergocalciferol, (DRISDOL) 50000 units CAPS capsule TAKE 1 CAPSULE (50,000 UNITS TOTAL) EVERY 7 DAYS. 12 capsule 1   No current facility-administered medications for this encounter.     Physical Findings: The patient is in no acute distress. Patient is alert and oriented.  height is 6' (1.829 m) and weight is 139 lb 3.2 oz (63.1 kg). His  temperature is 97.7 F (36.5 C). His blood pressure is 133/69 and his pulse is 72. His oxygen saturation is 97%.   General: Alert and oriented, in no acute distress HEENT: Head is normocephalic. Extraocular movements are intact. Oropharynx and oral cavity are clear without lesions. Mucous membranes are moist.  Neck: Neck is supple, no palpable cervical or supraclavicular lymphadenopathy. Submental cyst noted that is not of clinical concern. Heart: Regular in rate and rhythm with no murmurs, rubs, or gallops. Chest: Decreased breath sounds bilaterally with no rhonchi, wheezes, or rales.  Abdomen: Soft, nontender, nondistended, with no rigidity or guarding. Extremities: No cyanosis or edema. Lymphatics: see Neck Exam Skin: No concerning lesions.   Vasc:Right chest porta cath Musculoskeletal: symmetric strength and muscle tone throughout. Neurologic: Cranial nerves II through XII are grossly intact. No obvious focalities. Speech is fluent. Coordination is intact. Psychiatric: Judgment and insight are intact. Affect is appropriate.     Lab Findings: Lab Results  Component Value Date   WBC 4.0 09/17/2016   HGB 13.7 09/17/2016   HCT 42.8 09/17/2016   MCV 83.4 09/17/2016   PLT 179 09/17/2016   Lab Results  Component Value Date   TSH 0.816 09/17/2016    Radiographic Findings: No results found.  Impression/Plan:  61 year old gentleman with TxN3M0 squamous cell carcinoma of L neck, Stave IVB, unknown primary. NED.  He is doing well without complaints. Thyroid function is WNL on supplements.  Continue this per PCP.  1) Order to have porta cath removed at [facility].  2) Patient requests to remain under follow up at [facility] as well Follow up in 1 year.    _____________________________________   Lonie Peak, MD  This document serves as a record of services personally performed by Lonie Peak, MD. It was created on her behalf by Tawni Levy, a trained medical scribe. The  creation of this record is based on the scribe's personal observations and the provider's statements to them. This document has been checked and approved by the attending provider.

## 2017-03-17 ENCOUNTER — Encounter (HOSPITAL_COMMUNITY): Payer: Medicare HMO | Attending: Oncology | Admitting: Hematology

## 2017-03-17 ENCOUNTER — Other Ambulatory Visit (HOSPITAL_COMMUNITY): Payer: Medicare HMO

## 2017-03-17 ENCOUNTER — Ambulatory Visit (HOSPITAL_COMMUNITY): Payer: Medicare HMO | Admitting: Adult Health

## 2017-03-17 ENCOUNTER — Other Ambulatory Visit (HOSPITAL_COMMUNITY): Payer: Commercial Managed Care - HMO

## 2017-03-17 ENCOUNTER — Ambulatory Visit (HOSPITAL_COMMUNITY): Payer: Commercial Managed Care - HMO | Admitting: Oncology

## 2017-03-17 ENCOUNTER — Encounter (HOSPITAL_COMMUNITY): Payer: Self-pay | Admitting: Hematology

## 2017-03-17 ENCOUNTER — Encounter (HOSPITAL_COMMUNITY): Payer: Medicare HMO

## 2017-03-17 VITALS — BP 120/68 | HR 74 | Temp 98.1°F | Resp 18 | Wt 139.6 lb

## 2017-03-17 DIAGNOSIS — C760 Malignant neoplasm of head, face and neck: Secondary | ICD-10-CM | POA: Diagnosis not present

## 2017-03-17 DIAGNOSIS — E039 Hypothyroidism, unspecified: Secondary | ICD-10-CM | POA: Diagnosis not present

## 2017-03-17 DIAGNOSIS — C61 Malignant neoplasm of prostate: Secondary | ICD-10-CM | POA: Diagnosis not present

## 2017-03-17 DIAGNOSIS — Z8546 Personal history of malignant neoplasm of prostate: Secondary | ICD-10-CM

## 2017-03-17 LAB — CBC WITH DIFFERENTIAL/PLATELET
BASOS ABS: 0 10*3/uL (ref 0.0–0.1)
BASOS PCT: 0 %
Eosinophils Absolute: 0 10*3/uL (ref 0.0–0.7)
Eosinophils Relative: 1 %
HEMATOCRIT: 46.8 % (ref 39.0–52.0)
Hemoglobin: 15 g/dL (ref 13.0–17.0)
LYMPHS PCT: 32 %
Lymphs Abs: 1.3 10*3/uL (ref 0.7–4.0)
MCH: 27 pg (ref 26.0–34.0)
MCHC: 32.1 g/dL (ref 30.0–36.0)
MCV: 84.2 fL (ref 78.0–100.0)
Monocytes Absolute: 0.3 10*3/uL (ref 0.1–1.0)
Monocytes Relative: 9 %
NEUTROS ABS: 2.4 10*3/uL (ref 1.7–7.7)
NEUTROS PCT: 58 %
Platelets: 152 10*3/uL (ref 150–400)
RBC: 5.56 MIL/uL (ref 4.22–5.81)
RDW: 15.3 % (ref 11.5–15.5)
WBC: 4 10*3/uL (ref 4.0–10.5)

## 2017-03-17 LAB — COMPREHENSIVE METABOLIC PANEL
ALBUMIN: 4 g/dL (ref 3.5–5.0)
ALT: 14 U/L — AB (ref 17–63)
AST: 22 U/L (ref 15–41)
Alkaline Phosphatase: 60 U/L (ref 38–126)
Anion gap: 9 (ref 5–15)
BILIRUBIN TOTAL: 0.3 mg/dL (ref 0.3–1.2)
BUN: 19 mg/dL (ref 6–20)
CHLORIDE: 100 mmol/L — AB (ref 101–111)
CO2: 28 mmol/L (ref 22–32)
CREATININE: 1.01 mg/dL (ref 0.61–1.24)
Calcium: 9.4 mg/dL (ref 8.9–10.3)
GFR calc Af Amer: >60 mL/min (ref >60)
GLUCOSE: 77 mg/dL (ref 65–99)
POTASSIUM: 3.4 mmol/L — AB (ref 3.5–5.1)
Sodium: 137 mmol/L (ref 135–145)
TOTAL PROTEIN: 6.8 g/dL (ref 6.5–8.1)

## 2017-03-17 LAB — TSH: TSH: 0.351 u[IU]/mL (ref 0.350–4.500)

## 2017-03-17 NOTE — Patient Instructions (Signed)
[place] at [facility] Discharge Instructions  RECOMMENDATIONS MADE BY THE CONSULTANT AND ANY TEST RESULTS WILL BE SENT TO YOUR REFERRING PHYSICIAN.  You were seen today by Dr. Irene Limbo Follow up 6 months, chest xray prior to appointment See Amy up front for appointments   Thank you for choosing [place] at [facility] to provide your oncology and hematology care.  To afford each patient quality time with our provider, please arrive at least 15 minutes before your scheduled appointment time.    If you have a lab appointment with the [place] please come in thru the  Main Entrance and check in at the main information desk  You need to re-schedule your appointment should you arrive 10 or more minutes late.  We strive to give you quality time with our providers, and arriving late affects you and other patients whose appointments are after yours.  Also, if you no show three or more times for appointments you may be dismissed from the clinic at the providers discretion.     Again, thank you for choosing [facility]  Our hope is that these requests will decrease the amount of time that you wait before being seen by our physicians.       _____________________________________________________________  Should you have questions after your visit to [facility], please contact our office at [phone] between the hours of 8:30 a.m. and 4:30 p.m.  Voicemails left after 4:30 p.m. will not be returned until the following business day.  For prescription refill requests, have your pharmacy contact our office.       Resources For Cancer Patients and their Caregivers ? American Cancer Society: Can assist with transportation, wigs, general needs, runs Look Good Feel Better.        403-027-2685 ? Cancer Care: Provides financial assistance, online support groups, medication/co-pay assistance.  1-800-813-HOPE 838-031-9560) ? [place] Assists [place] cancer patients and their families through emotional , educational and financial support.  779-830-0950 ? [place] DSS Where to apply for food stamps, Medicaid and utility assistance. (724)151-7698 ? RCATS: Transportation to medical appointments. 740-277-1630 ? Social Security Administration: May apply for disability if have a Stage IV cancer. (681)343-8594 (757)854-2603 ? [employer], Disability and Transit Services: Assists with nutrition, care and transit needs. [place] Support Programs: @10RELATIVEDAYS @ > Cancer Support Group  2nd Tuesday of the month 1pm-2pm, Journey Room  > Creative Journey  3rd Tuesday of the month 1130am-1pm, Journey Room  > Look Good Feel Better  1st Wednesday of the month 10am-12 noon, Journey Room (Call [place] to register [phone])

## 2017-03-22 NOTE — Progress Notes (Signed)
Marland Kitchen  HEMATOLOGY ONCOLOGY PROGRESS NOTE  Date of service: .03/17/2017  Patient Care Team: [name], MD as PCP - General (Family Medicine) Jerrell Belfast, MD as Consulting Physician (Otolaryngology) Heath Lark, MD as Consulting Physician (Hematology and Oncology) Eppie Gibson, MD as Attending Physician (Radiation Oncology)  CC: f/u for head and neck cancer  Diagnosis:   Squamous cell carcinoma of the head and neck, stage IV (L neck) Cisplatin/5-FU and XRT, finished XRT 12/18/2014  Prostate Cancer, definitive XRT  Current Treatment: observation  SUMMARY OF ONCOLOGIC HISTORY: Oncology History   Secondary and unspecified malignant neoplasm of lymph nodes of head, face, and neck, likely nasopharynx in origin   Primary site: Pharynx - Nasopharynx   Staging method: AJCC 7th Edition   Clinical: Stage II (T1, N1, M0)   Summary: Stage II (T1, N1, M0)       Squamous cell carcinoma of head and neck ([place])   05/05/2014 Imaging    CT scan of the neck show enlarged left lymph node measure less than 6 cm.      05/25/2014 Pathology Results    [phone] is positive for squamous cell carcinoma.      05/25/2014 Procedure    Lymph node biopsy of the left neck came back squamous cell carcinoma.      06/20/2014 Imaging    PET CT scan show no evidence of distant metastatic disease.      06/29/2014 Surgery    Laryngoscopy and random biopsy of the nasopharynx was negative for malignancy.      07/27/2014 - 09/27/2014 Chemotherapy    Cisplatin + 5FU (days 1-5) every 28 days 4 cycles      11/06/2014 - 12/25/2014 Radiation Therapy    In [place] with Dr. Isidore Moos      04/25/2015 Imaging    PET - Restaging.  No evidence of hypermetabolic recurrent or metastatic disease.      05/17/2015 Pathology Results    [id number]:  p16 (HPV) negative.       INTERVAL HISTORY:  Patient is here for his scheduled 6 month follow-up for his head and neck cancer after his last clinic visit for medical  oncology with Dr. Whitney Muse on 09/17/2016. He notes no new dysphagia throat pain or other new local symptoms. No new neck masses. Weight has been relatively stable. He has quit smoking. His Port-A-Cath was scheduled for removal on 3/302018  by Dr. Isidore Moos.  REVIEW OF SYSTEMS:    10 Point review of systems of done and is negative except as noted above.  Past Medical History:  Diagnosis Date  . Arthritis   . Blindness of right eye   . Cancer ([place])    pharyngeal ca  . Cataract, secondary obscuring vision    LEFT  . Graves disease   . History of shingles    RASH  02-03-2014  PER PCP RESOLVING  . Hypertension    NO MEDS ,  MONITORED BY PCP  . Hypothyroidism, postradioiodine therapy   . Prostate cancer ([place])    DX  SEVERAL  YRS   AGO  WITH RADIATION THERAPY  . Squamous cell carcinoma of head and neck ([place]) 07/12/2014  . 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 Past Surgical History:  Procedure Laterality Date  . COLONOSCOPY  10/20/2011   Procedure: COLONOSCOPY;  Surgeon: Daneil Dolin, MD;  [facility];  Service: Endoscopy;  Laterality: N/A;  1:00 pm  . CRYOABLATION N/A 04/07/2014   Procedure: SALVAGE  CRYO ABLATION PROSTATE;  Surgeon: Lowella Bandy, MD;  Location: [facility];  Service: Urology;  Laterality: N/A;  . LARYNGOSCOPY    . LYMPH NODE BIOPSY      . Social History  Substance Use Topics  . Smoking status: Former Smoker    Packs/day: 1.00    Years: 40.00    Types: Cigarettes    Quit date: 09/29/2013  . Smokeless tobacco: Never Used  . Alcohol use No    ALLERGIES:  has No Known Allergies.  MEDICATIONS:  Current Outpatient Prescriptions  Medication Sig Dispense Refill  . hydrochlorothiazide (HYDRODIURIL) 12.5 MG tablet Take 1 tablet (12.5 mg total) by mouth daily. 90 tablet 3  . levothyroxine (SYNTHROID, LEVOTHROID) 100 MCG tablet Take 1 tablet (100 mcg total) by mouth daily. 90 tablet 3  . pravastatin (PRAVACHOL) 40 MG tablet  Take 1 tablet (40 mg total) by mouth daily. 90 tablet 3  . Vitamin D, Ergocalciferol, (DRISDOL) 50000 units CAPS capsule TAKE 1 CAPSULE (50,000 UNITS TOTAL) EVERY 7 DAYS. 12 capsule 1   No current facility-administered medications for this visit.     PHYSICAL EXAMINATION: ECOG PERFORMANCE STATUS: 1 - Symptomatic but completely ambulatory  . Vitals:   03/17/17 1541  BP: 120/68  Pulse: 74  Resp: 18  Temp: 98.1 F (36.7 C)    Filed Weights   03/17/17 1541  Weight: 139 lb 9.6 oz (63.3 kg)   .Body mass index is 18.93 kg/m.  GENERAL:alert, in no acute distress and comfortable SKIN: no acute rashes, no significant lesions EYES: conjunctiva are pink and non-injected, sclera anicteric OROPHARYNX: MMM, no exudates, no oropharyngeal erythema or ulceration NECK: supple, no JVD LYMPH:  no palpable lymphadenopathy in the cervical, axillary or inguinal regions LUNGS: clear to auscultation b/l with normal respiratory effort HEART: regular rate & rhythm ABDOMEN:  normoactive bowel sounds , non tender, not distended. Extremity: no pedal edema PSYCH: alert & oriented x 3 with fluent speech NEURO: no focal motor/sensory deficits  LABORATORY DATA:   I have reviewed the data as listed  . CBC Latest Ref Rng & Units 03/17/2017 09/17/2016 03/17/2016  WBC 4.0 - 10.5 K/uL 4.0 4.0 4.0  Hemoglobin 13.0 - 17.0 g/dL 15.0 13.7 12.6(L)  Hematocrit 39.0 - 52.0 % 46.8 42.8 39.9  Platelets 150 - 400 K/uL 152 179 239    . CMP Latest Ref Rng & Units 03/17/2017 09/17/2016 03/17/2016  Glucose 65 - 99 mg/dL 77 98 74  BUN 6 - 20 mg/dL 19 18 18   Creatinine 0.61 - 1.24 mg/dL 1.01 0.96 0.78  Sodium 135 - 145 mmol/L 137 138 136  Potassium 3.5 - 5.1 mmol/L 3.4(L) 3.5 3.8  Chloride 101 - 111 mmol/L 100(L) 102 102  CO2 22 - 32 mmol/L 28 27 27   Calcium 8.9 - 10.3 mg/dL 9.4 9.1 9.2  Total Protein 6.5 - 8.1 g/dL 6.8 7.1 7.1  Total Bilirubin 0.3 - 1.2 mg/dL 0.3 0.4 0.4  Alkaline Phos 38 - 126 U/L 60 62 53  AST  15 - 41 U/L 22 19 17   ALT 17 - 63 U/L 14(L) 10(L) 11(L)     RADIOGRAPHIC STUDIES: I have personally reviewed the radiological images as listed and agreed with the findings in the report. No results found.  ASSESSMENT & PLAN:   Squamous cell carcinoma of the head and neck, stage IV (L neck) Cisplatin/5-FU and XRT, finished XRT 12/18/2014 Plan -Patient has no clinical evidence of head and neck cancer recurrence at this time. -  He was seen by Dr. Isidore Moos radiation oncology on 03/06/2017. [facility] removal was ordered by Dr. Isidore Moos and has been scheduled for 03/27/2017 -No change in breathing. -Notes that he has stayed off the smoking. -Plan to see him back in 6 months with labs and chest x-ray.  Prostate cancer status post definitive radiation therapy. -Continue follow-up with urology  RTC in 6 months with labs and CXR  I spent 15 minutes counseling the patient face to face. The total time spent in the appointment was 20 minutes and more than 50% was on counseling and direct patient cares.    Sullivan Lone MD [place] AAHIVMS [facility] Hematology/Oncology Physician [facility]  (Office):       [phone] (Work cell):  [phone] (Fax):           [phone]

## 2017-03-24 ENCOUNTER — Ambulatory Visit (INDEPENDENT_AMBULATORY_CARE_PROVIDER_SITE_OTHER): Payer: Medicare HMO | Admitting: Urology

## 2017-03-24 ENCOUNTER — Other Ambulatory Visit: Payer: Self-pay | Admitting: Radiology

## 2017-03-24 DIAGNOSIS — K612 Anorectal abscess: Secondary | ICD-10-CM | POA: Diagnosis not present

## 2017-03-24 DIAGNOSIS — C61 Malignant neoplasm of prostate: Secondary | ICD-10-CM

## 2017-03-24 DIAGNOSIS — R9721 Rising PSA following treatment for malignant neoplasm of prostate: Secondary | ICD-10-CM | POA: Diagnosis not present

## 2017-03-26 ENCOUNTER — Other Ambulatory Visit: Payer: Self-pay | Admitting: Radiology

## 2017-03-26 ENCOUNTER — Other Ambulatory Visit: Payer: Self-pay | Admitting: Student

## 2017-03-27 ENCOUNTER — Encounter (HOSPITAL_COMMUNITY): Payer: Self-pay

## 2017-03-27 ENCOUNTER — Ambulatory Visit (HOSPITAL_COMMUNITY)
Admission: RE | Admit: 2017-03-27 | Discharge: 2017-03-27 | Disposition: A | Discharge status: Home / Self Care | Payer: Medicare HMO | Source: Ambulatory Visit | Attending: Radiation Oncology | Admitting: Radiation Oncology

## 2017-03-27 DIAGNOSIS — Z8546 Personal history of malignant neoplasm of prostate: Secondary | ICD-10-CM | POA: Diagnosis not present

## 2017-03-27 DIAGNOSIS — Z923 Personal history of irradiation: Secondary | ICD-10-CM | POA: Diagnosis not present

## 2017-03-27 DIAGNOSIS — Z85828 Personal history of other malignant neoplasm of skin: Secondary | ICD-10-CM | POA: Diagnosis not present

## 2017-03-27 DIAGNOSIS — Y842 Radiological procedure and radiotherapy as the cause of abnormal reaction of the patient, or of later complication, without mention of misadventure at the time of the procedure: Secondary | ICD-10-CM | POA: Diagnosis not present

## 2017-03-27 DIAGNOSIS — Z9221 Personal history of antineoplastic chemotherapy: Secondary | ICD-10-CM | POA: Insufficient documentation

## 2017-03-27 DIAGNOSIS — C760 Malignant neoplasm of head, face and neck: Secondary | ICD-10-CM

## 2017-03-27 DIAGNOSIS — E890 Postprocedural hypothyroidism: Secondary | ICD-10-CM | POA: Diagnosis not present

## 2017-03-27 DIAGNOSIS — Z85818 Personal history of malignant neoplasm of other sites of lip, oral cavity, and pharynx: Secondary | ICD-10-CM | POA: Insufficient documentation

## 2017-03-27 DIAGNOSIS — I10 Essential (primary) hypertension: Secondary | ICD-10-CM | POA: Diagnosis not present

## 2017-03-27 DIAGNOSIS — Z452 Encounter for adjustment and management of vascular access device: Secondary | ICD-10-CM | POA: Insufficient documentation

## 2017-03-27 HISTORY — PX: IR GENERIC HISTORICAL: IMG1180011

## 2017-03-27 LAB — CBC WITH DIFFERENTIAL/PLATELET
BASOS ABS: 0 10*3/uL (ref 0.0–0.1)
BASOS PCT: 1 %
Eosinophils Absolute: 0 10*3/uL (ref 0.0–0.7)
Eosinophils Relative: 1 %
HEMATOCRIT: 47.9 % (ref 39.0–52.0)
HEMOGLOBIN: 15.4 g/dL (ref 13.0–17.0)
Lymphocytes Relative: 42 %
Lymphs Abs: 1.3 10*3/uL (ref 0.7–4.0)
MCH: 26.7 pg (ref 26.0–34.0)
MCHC: 32.2 g/dL (ref 30.0–36.0)
MCV: 83.2 fL (ref 78.0–100.0)
MONOS PCT: 9 %
Monocytes Absolute: 0.3 10*3/uL (ref 0.1–1.0)
NEUTROS ABS: 1.5 10*3/uL — AB (ref 1.7–7.7)
NEUTROS PCT: 47 %
Platelets: 159 10*3/uL (ref 150–400)
RBC: 5.76 MIL/uL (ref 4.22–5.81)
RDW: 15.2 % (ref 11.5–15.5)
WBC: 3 10*3/uL — AB (ref 4.0–10.5)

## 2017-03-27 LAB — PROTIME-INR
INR: 0.98
Prothrombin Time: 13 seconds (ref 11.4–15.2)

## 2017-03-27 MED ORDER — FENTANYL CITRATE (PF) 100 MCG/2ML IJ SOLN
INTRAMUSCULAR | Status: AC | PRN
  Administered 2017-03-27: 25 ug via INTRAVENOUS

## 2017-03-27 MED ORDER — FENTANYL CITRATE (PF) 100 MCG/2ML IJ SOLN
INTRAMUSCULAR | Status: AC
  Filled 2017-03-27: qty 2

## 2017-03-27 MED ORDER — MIDAZOLAM HCL 2 MG/2ML IJ SOLN
INTRAMUSCULAR | Status: AC | PRN
  Administered 2017-03-27: 1 mg via INTRAVENOUS

## 2017-03-27 MED ORDER — MIDAZOLAM HCL 2 MG/2ML IJ SOLN
INTRAMUSCULAR | Status: AC
  Filled 2017-03-27: qty 2

## 2017-03-27 MED ORDER — HYDROCODONE-ACETAMINOPHEN 5-325 MG PO TABS: 1.0000 | ORAL_TABLET | ORAL | Status: DC | PRN

## 2017-03-27 MED ORDER — CEFAZOLIN SODIUM-DEXTROSE 2-4 GM/100ML-% IV SOLN
INTRAVENOUS | Status: AC
  Filled 2017-03-27: qty 100

## 2017-03-27 MED ORDER — CEFAZOLIN SODIUM-DEXTROSE 2-4 GM/100ML-% IV SOLN
2.0000 g | INTRAVENOUS | Status: AC
  Administered 2017-03-27: 2 g via INTRAVENOUS

## 2017-03-27 MED ORDER — LIDOCAINE-EPINEPHRINE (PF) 2 %-1:200000 IJ SOLN
INTRAMUSCULAR | Status: AC
  Filled 2017-03-27: qty 20

## 2017-03-27 MED ORDER — LIDOCAINE-EPINEPHRINE (PF) 2 %-1:200000 IJ SOLN
INTRAMUSCULAR | Status: AC | PRN
  Administered 2017-03-27: 10 mL

## 2017-03-27 MED ORDER — SODIUM CHLORIDE 0.9 % IV SOLN
INTRAVENOUS | Status: DC
  Administered 2017-03-27: 13:00:00 via INTRAVENOUS

## 2017-03-27 NOTE — Discharge Instructions (Signed)
Moderate Conscious Sedation, Adult, Care After These instructions provide you with information about caring for yourself after your procedure. Your health care provider may also give you more specific instructions. Your treatment has been planned according to current medical practices, but problems sometimes occur. Call your health care provider if you have any problems or questions after your procedure. What can I expect after the procedure? After your procedure, it is common:  To feel sleepy for several hours.  To feel clumsy and have poor balance for several hours.  To have poor judgment for several hours.  To vomit if you eat too soon. Follow these instructions at home: For at least 24 hours after the procedure:    Do not:  Participate in activities where you could fall or become injured.  Drive.  Use heavy machinery.  Drink alcohol.  Take sleeping pills or medicines that cause drowsiness.  Make important decisions or sign legal documents.  Take care of children on your own.  Rest. Eating and drinking   Follow the diet recommended by your health care provider.  If you vomit:  Drink water, juice, or soup when you can drink without vomiting.  Make sure you have little or no nausea before eating solid foods. General instructions   Have a responsible adult stay with you until you are awake and alert.  Take over-the-counter and prescription medicines only as told by your health care provider.  If you smoke, do not smoke without supervision.  Keep all follow-up visits as told by your health care provider. This is important. Contact a health care provider if:  You keep feeling nauseous or you keep vomiting.  You feel light-headed.  You develop a rash.  You have a fever. Get help right away if:  You have trouble breathing. This information is not intended to replace advice given to you by your health care provider. Make sure you discuss any questions you  have with your health care provider. Document Released: 10/05/2013 Document Revised: 05/19/2016 Document Reviewed: 04/05/2016 Elsevier Interactive Patient Education  2017 [place].    May remove dressing 03/28/17 after 4:30 pm.

## 2017-03-27 NOTE — Sedation Documentation (Signed)
Patient is resting comfortably. 

## 2017-03-27 NOTE — Procedures (Signed)
Portacath is no longer needed.  Right port removed without complication.  Minimal blood loss.  No immediate complication.

## 2017-03-27 NOTE — H&P (Signed)
Referring Physician(s): Eppie Gibson  Supervising Physician: Markus Daft  Patient Status:  [facility] OP  Chief Complaint:  "I'm getting my port out"  Subjective: Patient familiar to IR service from prior left neck lymph node biopsy in 2015 as well as Port-A-Cath placement. He has a history of squamous cell cancer of the head/ neck as well as remote prostate cancer. He is status post chemoradiation. He has no clinical evidence of disease recurrence and presents today for Port-A-Cath removal. He currently denies fever, headache, chest pain, dyspnea, cough, abdominal/back pain, nausea, vomiting or abnormal bleeding. Past Medical History:  Diagnosis Date  . Arthritis   . Blindness of right eye   . Cancer ([place])    pharyngeal ca  . Cataract, secondary obscuring vision    LEFT  . Graves disease   . History of shingles    RASH  02-03-2014  PER PCP RESOLVING  . Hypertension    NO MEDS ,  MONITORED BY PCP  . Hypothyroidism, postradioiodine therapy   . Prostate cancer ([place])    DX  SEVERAL  YRS   AGO  WITH RADIATION THERAPY  . Squamous cell carcinoma of head and neck ([place]) 07/12/2014  . 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[place]) 06/29/14   Past Surgical History:  Procedure Laterality Date  . COLONOSCOPY  10/20/2011   Procedure: COLONOSCOPY;  Surgeon: Daneil Dolin, MD;  [facility];  Service: Endoscopy;  Laterality: N/A;  1:00 pm  . CRYOABLATION N/A 04/07/2014   Procedure: SALVAGE CRYO ABLATION PROSTATE;  Surgeon: Lowella Bandy, MD;  Location[facility];  Service: Urology;  Laterality: N/A;  . LARYNGOSCOPY    . LYMPH NODE BIOPSY        Allergies: Patient has no known allergies.  Medications: Prior to Admission medications   Medication Sig Start Date End Date Taking? Authorizing Provider  hydrochlorothiazide (HYDRODIURIL) 12.5 MG tablet Take 1 tablet (12.5 mg total) by mouth daily. 01/16/17  Yes [name], MD  levothyroxine (SYNTHROID, LEVOTHROID) 100  MCG tablet Take 1 tablet (100 mcg total) by mouth daily. 01/16/17  Yes [name], MD  pravastatin (PRAVACHOL) 40 MG tablet Take 1 tablet (40 mg total) by mouth daily. 12/24/16  Yes [name], MD  Vitamin D, Ergocalciferol, (DRISDOL) 50000 units CAPS capsule TAKE 1 CAPSULE (50,000 UNITS TOTAL) EVERY 7 DAYS. 01/16/17   [name], MD     Vital Signs: BP (!) 142/74 (BP Location: Left Arm)   Pulse 78   Temp 97.5 F (36.4 C) (Oral)   Resp 18   SpO2 98%   Physical Exam thin black male in no acute distress. Clean, intact right chest wall Port-A-Cath. Chest clear to auscultation bilaterally. Heart with regular rate and rhythm. Abdomen soft, positive bowel sounds, nontender. No lower extremity edema.  Imaging: No results found.  Labs:  CBC:  Recent Labs  09/17/16 1417 03/17/17 1453 03/27/17 1240  WBC 4.0 4.0 3.0*  HGB 13.7 15.0 15.4  HCT 42.8 46.8 47.9  PLT 179 152 159    COAGS:  Recent Labs  03/27/17 1240  INR 0.98    BMP:  Recent Labs  09/17/16 1417 03/17/17 1453  NA 138 137  K 3.5 3.4*  CL 102 100*  CO2 27 28  GLUCOSE 98 77  BUN 18 19  CALCIUM 9.1 9.4  CREATININE 0.96 1.01  GFRNONAA >60 >60  GFRAA >60 >60    LIVER FUNCTION TESTS:  Recent Labs  09/17/16  1417 03/17/17 1453  BILITOT 0.4 0.3  AST 19 22  ALT 10* 14*  ALKPHOS 62 60  PROT 7.1 6.8  ALBUMIN 4.1 4.0    Assessment and Plan: Pt with history of squamous cell cancer of the head/ neck as well as remote prostate cancer. He is status post chemoradiation. He has no clinical evidence of disease recurrence and presents today for Port-A-Cath removal. Details/risks of procedure, including but not limited to, internal bleeding, infection, injury to adjacent structures, discussed with patient and family with their understanding and consent.   Electronically Signed: D. Rowe Robert 03/27/2017, 1:26 PM   I spent a total of 15 minutes at the the patient's bedside AND on the patient's  hospital floor or unit, greater than 50% of which was counseling/coordinating care for port a cath removal

## 2017-04-10 ENCOUNTER — Other Ambulatory Visit: Payer: Self-pay | Admitting: Family Medicine

## 2017-04-24 ENCOUNTER — Ambulatory Visit: Payer: Commercial Managed Care - HMO | Admitting: Family Medicine

## 2017-04-24 ENCOUNTER — Encounter: Payer: Self-pay | Admitting: Family Medicine

## 2017-04-24 ENCOUNTER — Ambulatory Visit (INDEPENDENT_AMBULATORY_CARE_PROVIDER_SITE_OTHER): Payer: Medicare HMO | Admitting: Family Medicine

## 2017-04-24 VITALS — BP 133/82 | HR 65 | Temp 96.9°F | Ht 72.0 in | Wt 137.0 lb

## 2017-04-24 DIAGNOSIS — I10 Essential (primary) hypertension: Secondary | ICD-10-CM

## 2017-04-24 DIAGNOSIS — E785 Hyperlipidemia, unspecified: Secondary | ICD-10-CM

## 2017-04-24 DIAGNOSIS — E038 Other specified hypothyroidism: Secondary | ICD-10-CM

## 2017-04-24 NOTE — Progress Notes (Signed)
BP 133/82   Pulse 65   Temp (!) 96.9 F (36.1 C) (Oral)   Ht 6' (1.829 m)   Wt 137 lb (62.1 kg)   BMI 18.58 kg/m    Subjective:    Patient ID: Brandon Hall, male    DOB: [birth year], 61 y.o.   MRN: [id number]  HPI: Brandon Hall is a 61 y.o. male presenting on 04/24/2017 for Hypothyroidism (6 month followup; patient is fasting); Hyperlipidemia; and Hypertension   HPI Hypothyroidism recheck Patient is coming in for thyroid recheck today as well. He denies any issues with hair changes or heat or cold problems or diarrhea or constipation. He denies any chest pain or palpitations. He is currently on levothyroxine 100 micrograms   Hyperlipidemia Patient is coming in for recheck of his hyperlipidemia. He is currently taking pravastatin 40. He denies any issues with myalgias or history of liver damage from it. He denies any focal numbness or weakness or chest pain.   Hypertension recheck Patient is coming in for blood pressure recheck. He is currently on hydrochlorothiazide 12.5 mg. His blood pressure today is 133/82. He denies any lightheadedness or dizziness. Patient denies headaches, blurred vision, chest pains, shortness of breath, or weakness. Denies any side effects from medication and is content with current medication.   Relevant past medical, surgical, family and social history reviewed and updated as indicated. Interim medical history since our last visit reviewed. Allergies and medications reviewed and updated.  Review of Systems  Constitutional: Negative for chills and fever.  HENT: Negative for congestion.   Respiratory: Negative for shortness of breath and wheezing.   Cardiovascular: Negative for chest pain and leg swelling.  Musculoskeletal: Negative for back pain and gait problem.  Skin: Negative for rash.  Neurological: Negative for dizziness, weakness, light-headedness, numbness and headaches.  All other systems reviewed and are negative.   Per HPI unless  specifically indicated above     Objective:    BP 133/82   Pulse 65   Temp (!) 96.9 F (36.1 C) (Oral)   Ht 6' (1.829 m)   Wt 137 lb (62.1 kg)   BMI 18.58 kg/m   Wt Readings from Last 3 Encounters:  04/24/17 137 lb (62.1 kg)  03/17/17 139 lb 9.6 oz (63.3 kg)  03/06/17 139 lb 3.2 oz (63.1 kg)    Physical Exam  Constitutional: He is oriented to person, place, and time. He appears well-developed and well-nourished. No distress.  Eyes: Conjunctivae are normal. No scleral icterus.  Neck: Neck supple. No thyromegaly present.  Cardiovascular: Normal rate, regular rhythm, normal heart sounds and intact distal pulses.   No murmur heard. Pulmonary/Chest: Effort normal and breath sounds normal. No respiratory distress. He has no wheezes. He has no rales.  Abdominal: Soft. Bowel sounds are normal. He exhibits no distension. There is no tenderness. There is no rebound.  Musculoskeletal: Normal range of motion. He exhibits no edema.  Lymphadenopathy:    He has no cervical adenopathy.  Neurological: He is alert and oriented to person, place, and time. Coordination normal.  Skin: Skin is warm and dry. No rash noted. He is not diaphoretic.  Psychiatric: He has a normal mood and affect. His behavior is normal.  Nursing note and vitals reviewed.     Assessment & Plan:   Problem List Items Addressed This Visit      Cardiovascular and Mediastinum   Hypertension   Relevant Orders   CMP14+EGFR     Endocrine   Hypothyroid -  Primary   Relevant Orders   CBC with Differential/Platelet   TSH     Other   Dyslipidemia   Relevant Orders   Lipid panel       Follow up plan: Return in about 6 months (around 10/24/2017), or if symptoms worsen or fail to improve, for Recheck thyroid and blood pressure.  Counseling provided for all of the vaccine components Orders Placed This Encounter  Procedures  . CMP14+EGFR  . CBC with Differential/Platelet  . Lipid panel  . TSH    Arville Care, MD [facility] 04/24/2017, 10:26 AM

## 2017-04-25 LAB — CMP14+EGFR
ALT: 13 IU/L (ref 0–44)
AST: 21 IU/L (ref 0–40)
Albumin/Globulin Ratio: 1.8 (ref 1.2–2.2)
Albumin: 4.6 g/dL (ref 3.6–4.8)
Alkaline Phosphatase: 69 IU/L (ref 39–117)
BUN/Creatinine Ratio: 13 (ref 10–24)
BUN: 13 mg/dL (ref 8–27)
Bilirubin Total: 0.6 mg/dL (ref 0.0–1.2)
CALCIUM: 10.1 mg/dL (ref 8.6–10.2)
CHLORIDE: 96 mmol/L (ref 96–106)
CO2: 28 mmol/L (ref 18–29)
Creatinine, Ser: 0.97 mg/dL (ref 0.76–1.27)
GFR calc non Af Amer: 83 mL/min/{1.73_m2} (ref >59)
GFR, EST AFRICAN AMERICAN: 96 mL/min/{1.73_m2} (ref >59)
GLUCOSE: 82 mg/dL (ref 65–99)
Globulin, Total: 2.6 g/dL (ref 1.5–4.5)
Potassium: 4.5 mmol/L (ref 3.5–5.2)
Sodium: 139 mmol/L (ref 134–144)
TOTAL PROTEIN: 7.2 g/dL (ref 6.0–8.5)

## 2017-04-25 LAB — CBC WITH DIFFERENTIAL/PLATELET
BASOS ABS: 0 10*3/uL (ref 0.0–0.2)
Basos: 1 %
EOS (ABSOLUTE): 0 10*3/uL (ref 0.0–0.4)
Eos: 1 %
Hematocrit: 48.6 % (ref 37.5–51.0)
Hemoglobin: 16.2 g/dL (ref 13.0–17.7)
Immature Grans (Abs): 0 10*3/uL (ref 0.0–0.1)
Immature Granulocytes: 0 %
LYMPHS ABS: 1.4 10*3/uL (ref 0.7–3.1)
LYMPHS: 38 %
MCH: 27 pg (ref 26.6–33.0)
MCHC: 33.3 g/dL (ref 31.5–35.7)
MCV: 81 fL (ref 79–97)
MONOCYTES: 10 %
Monocytes Absolute: 0.3 10*3/uL (ref 0.1–0.9)
NEUTROS ABS: 1.8 10*3/uL (ref 1.4–7.0)
NEUTROS PCT: 50 %
PLATELETS: 170 10*3/uL (ref 150–379)
RBC: 5.99 x10E6/uL — AB (ref 4.14–5.80)
RDW: 15.5 % — ABNORMAL HIGH (ref 12.3–15.4)
WBC: 3.6 10*3/uL (ref 3.4–10.8)

## 2017-04-25 LAB — LIPID PANEL
CHOLESTEROL TOTAL: 159 mg/dL (ref 100–199)
Chol/HDL Ratio: 2.4 ratio (ref 0.0–5.0)
HDL: 67 mg/dL (ref >39)
LDL Calculated: 83 mg/dL (ref 0–99)
TRIGLYCERIDES: 45 mg/dL (ref 0–149)
VLDL Cholesterol Cal: 9 mg/dL (ref 5–40)

## 2017-04-25 LAB — TSH: TSH: 0.831 u[IU]/mL (ref 0.450–4.500)

## 2017-07-28 ENCOUNTER — Ambulatory Visit (INDEPENDENT_AMBULATORY_CARE_PROVIDER_SITE_OTHER): Payer: Medicare HMO | Admitting: Urology

## 2017-07-28 DIAGNOSIS — C61 Malignant neoplasm of prostate: Secondary | ICD-10-CM

## 2017-07-28 DIAGNOSIS — R9721 Rising PSA following treatment for malignant neoplasm of prostate: Secondary | ICD-10-CM

## 2017-07-28 DIAGNOSIS — N5201 Erectile dysfunction due to arterial insufficiency: Secondary | ICD-10-CM

## 2017-08-10 ENCOUNTER — Encounter: Payer: Self-pay | Admitting: Family Medicine

## 2017-08-10 ENCOUNTER — Ambulatory Visit (INDEPENDENT_AMBULATORY_CARE_PROVIDER_SITE_OTHER): Payer: Medicare HMO | Admitting: Family Medicine

## 2017-08-10 VITALS — BP 110/77 | HR 78 | Temp 97.1°F | Ht 72.0 in | Wt 137.0 lb

## 2017-08-10 DIAGNOSIS — K610 Anal abscess: Secondary | ICD-10-CM | POA: Insufficient documentation

## 2017-08-10 MED ORDER — SULFAMETHOXAZOLE-TRIMETHOPRIM 800-160 MG PO TABS
1.0000 | ORAL_TABLET | Freq: Two times a day (BID) | ORAL | 0 refills | Status: DC
Start: 2017-08-10 — End: 2017-09-17

## 2017-08-10 NOTE — Progress Notes (Signed)
BP 110/77   Pulse 78   Temp (!) 97.1 F (36.2 C) (Oral)   Ht 6' (1.829 m)   Wt 137 lb (62.1 kg)   BMI 18.58 kg/m    Subjective:    Patient ID: Brandon Hall, male    DOB: 11-Mar-1956, 61 y.o.   MRN: [id number]  HPI: Brandon Hall is a 61 y.o. male presenting on 08/10/2017 for a skin abscess on his left buttock. He was seen for this at Urgent Care 9 days ago, and he was given 10 days of Doxycycline but it did not improve and became acutely worse yesterday. He reports intense pain with sitting and difficulty sleeping. His wife has been putting barrier cream on it without relief. He denies fever or chills.   HPI   Relevant past medical, surgical, family and social history reviewed and updated as indicated. Interim medical history since our last visit reviewed. Allergies and medications reviewed and updated.  Review of Systems  Constitutional: Negative for chills and fever.  HENT: Negative for congestion, rhinorrhea, sinus pain, sneezing and sore throat.   Respiratory: Negative for cough, shortness of breath and wheezing.   Cardiovascular: Negative for chest pain, palpitations and leg swelling.  Skin: Positive for wound (abscess on left buttock). Negative for color change, pallor and rash.  Neurological: Negative for weakness and headaches.  Psychiatric/Behavioral: Negative for agitation, behavioral problems and confusion.    Per HPI unless specifically indicated above        Objective:    BP 110/77   Pulse 78   Temp (!) 97.1 F (36.2 C) (Oral)   Ht 6' (1.829 m)   Wt 137 lb (62.1 kg)   BMI 18.58 kg/m   Wt Readings from Last 3 Encounters:  08/10/17 137 lb (62.1 kg)  04/24/17 137 lb (62.1 kg)  03/17/17 139 lb 9.6 oz (63.3 kg)    Physical Exam  Constitutional: He is oriented to person, place, and time. He appears well-developed and well-nourished. He appears distressed.  HENT:  Head: Normocephalic and atraumatic.  Mouth/Throat: Oropharynx is clear and moist.  Eyes:  Pupils are equal, round, and reactive to light. Conjunctivae and EOM are normal.  Neck: Normal range of motion. Neck supple.  Cardiovascular: Normal rate, regular rhythm and normal heart sounds.   No murmur heard. Pulmonary/Chest: Effort normal and breath sounds normal. He has no wheezes. He has no rales.  Abdominal: Soft. Bowel sounds are normal.  Genitourinary:  Genitourinary Comments: 4cm area of induration draining puss on left gluteal cleft  Musculoskeletal: Normal range of motion.  Neurological: He is alert and oriented to person, place, and time. He has normal reflexes.  Skin: Skin is warm and dry. He is not diaphoretic.  Psychiatric: He has a normal mood and affect. His behavior is normal. Judgment and thought content normal.  Nursing note and vitals reviewed.       Assessment & Plan:   Problem List Items Addressed This Visit      Other   Perianal abscess - Primary   Relevant Medications   sulfamethoxazole-trimethoprim (BACTRIM DS,SEPTRA DS) 800-160 MG tablet   Other Relevant Orders   Anaerobic and Aerobic Culture (Completed)      Brandon Hall is a 61 y.o. male presenting on 08/10/2017 for a skin abscess on his left buttock. The abscess was already open and draining. I milked out as much purulent material as possible. I instructed he and his wife to do the same twice daily until  it heals completely. I instructed him to complete his Doxycycline and I  ordered Bactrim for him as well.   Follow up plan: Return if abscess stops draining and is unhealed. Return for normally scheduled checkup.   Patient seen and examined with Havery Moros medical student. Agree with assessment and plan above. We'll switch antibiotic because of possible resistance and to culture as well. Arville Care, MD [facility] 08/10/2017, 3:28 PM

## 2017-08-14 LAB — ANAEROBIC AND AEROBIC CULTURE

## 2017-08-25 ENCOUNTER — Other Ambulatory Visit: Payer: Self-pay | Admitting: Family

## 2017-09-17 ENCOUNTER — Other Ambulatory Visit (HOSPITAL_COMMUNITY): Payer: Medicare HMO

## 2017-09-17 ENCOUNTER — Encounter (HOSPITAL_COMMUNITY): Payer: Self-pay | Admitting: Adult Health

## 2017-09-17 ENCOUNTER — Encounter (HOSPITAL_COMMUNITY): Payer: Medicare HMO

## 2017-09-17 ENCOUNTER — Ambulatory Visit (HOSPITAL_COMMUNITY): Payer: Medicare HMO | Admitting: Oncology

## 2017-09-17 ENCOUNTER — Encounter (HOSPITAL_COMMUNITY): Payer: Medicare HMO | Attending: Adult Health | Admitting: Adult Health

## 2017-09-17 VITALS — BP 112/78 | HR 84 | Resp 16 | Ht 72.0 in | Wt 139.0 lb

## 2017-09-17 DIAGNOSIS — C7989 Secondary malignant neoplasm of other specified sites: Secondary | ICD-10-CM | POA: Diagnosis not present

## 2017-09-17 DIAGNOSIS — Z8546 Personal history of malignant neoplasm of prostate: Secondary | ICD-10-CM | POA: Diagnosis not present

## 2017-09-17 DIAGNOSIS — E876 Hypokalemia: Secondary | ICD-10-CM | POA: Insufficient documentation

## 2017-09-17 DIAGNOSIS — C760 Malignant neoplasm of head, face and neck: Secondary | ICD-10-CM | POA: Diagnosis not present

## 2017-09-17 DIAGNOSIS — C801 Malignant (primary) neoplasm, unspecified: Secondary | ICD-10-CM

## 2017-09-17 DIAGNOSIS — H5440 Blindness, one eye, unspecified eye: Secondary | ICD-10-CM | POA: Diagnosis not present

## 2017-09-17 DIAGNOSIS — E039 Hypothyroidism, unspecified: Secondary | ICD-10-CM | POA: Diagnosis not present

## 2017-09-17 LAB — COMPREHENSIVE METABOLIC PANEL
ALT: 12 U/L — ABNORMAL LOW (ref 17–63)
AST: 21 U/L (ref 15–41)
Albumin: 3.9 g/dL (ref 3.5–5.0)
Alkaline Phosphatase: 54 U/L (ref 38–126)
Anion gap: 10 (ref 5–15)
BUN: 16 mg/dL (ref 6–20)
CO2: 27 mmol/L (ref 22–32)
Calcium: 9.3 mg/dL (ref 8.9–10.3)
Chloride: 100 mmol/L — ABNORMAL LOW (ref 101–111)
Creatinine, Ser: 0.99 mg/dL (ref 0.61–1.24)
GFR calc Af Amer: >60 mL/min (ref >60)
GFR calc non Af Amer: >60 mL/min (ref >60)
Glucose, Bld: 108 mg/dL — ABNORMAL HIGH (ref 65–99)
Potassium: 3.3 mmol/L — ABNORMAL LOW (ref 3.5–5.1)
Sodium: 137 mmol/L (ref 135–145)
Total Bilirubin: 0.4 mg/dL (ref 0.3–1.2)
Total Protein: 6.7 g/dL (ref 6.5–8.1)

## 2017-09-17 LAB — CBC WITH DIFFERENTIAL/PLATELET
BASOS ABS: 0 10*3/uL (ref 0.0–0.1)
Basophils Relative: 1 %
EOS ABS: 0 10*3/uL (ref 0.0–0.7)
Eosinophils Relative: 1 %
HCT: 44.3 % (ref 39.0–52.0)
HEMOGLOBIN: 14.3 g/dL (ref 13.0–17.0)
LYMPHS ABS: 1.4 10*3/uL (ref 0.7–4.0)
Lymphocytes Relative: 35 %
MCH: 26.9 pg (ref 26.0–34.0)
MCHC: 32.3 g/dL (ref 30.0–36.0)
MCV: 83.4 fL (ref 78.0–100.0)
Monocytes Absolute: 0.3 10*3/uL (ref 0.1–1.0)
Monocytes Relative: 8 %
NEUTROS PCT: 55 %
Neutro Abs: 2.1 10*3/uL (ref 1.7–7.7)
Platelets: 152 10*3/uL (ref 150–400)
RBC: 5.31 MIL/uL (ref 4.22–5.81)
RDW: 16.1 % — ABNORMAL HIGH (ref 11.5–15.5)
WBC: 3.9 10*3/uL — AB (ref 4.0–10.5)

## 2017-09-17 MED ORDER — POTASSIUM CHLORIDE CRYS ER 20 MEQ PO TBCR
20.0000 meq | EXTENDED_RELEASE_TABLET | Freq: Two times a day (BID) | ORAL | 0 refills | Status: DC
Start: 2017-09-17 — End: 2018-04-27

## 2017-09-17 NOTE — Progress Notes (Signed)
[place] [place], [place] [address]   CLINIC:  Medical Oncology/Hematology  PCP:  Dettinger, Fransisca Kaufmann, MD [place] [id number] [phone]   REASON FOR VISIT:  Follow-up for Stage IVB squamous cell carcinoma with (L) neck mets/unknown primary (likely H&N origin); p16-  CURRENT THERAPY: Surveillance   BRIEF ONCOLOGIC HISTORY:    Squamous cell carcinoma of head and neck ([place])   05/05/2014 Imaging    CT scan of the neck show enlarged left lymph node measure less than 6 cm.      05/25/2014 Pathology Results    [phone] is positive for squamous cell carcinoma.      05/25/2014 Procedure    Lymph node biopsy of the left neck came back squamous cell carcinoma.      06/20/2014 Imaging    PET CT scan show no evidence of distant metastatic disease.      06/29/2014 Surgery    Laryngoscopy and random biopsy of the nasopharynx was negative for malignancy.      07/27/2014 - 09/27/2014 Chemotherapy    Cisplatin + 5FU (days 1-5) every 28 days 4 cycles      11/06/2014 - 12/25/2014 Radiation Therapy    In [place] with Dr. Isidore Moos      04/25/2015 Imaging    PET - Restaging.  No evidence of hypermetabolic recurrent or metastatic disease.      05/17/2015 Pathology Results    [id number]:  p16 (HPV) negative.         INTERVAL HISTORY:  Mr. Ettinger 61 y.o. male returns for routine follow-up for history of Stage IVB cancer of (L) neck/unknown primary.   He is here today with his wife and cousin.  Overall, he tells me he has been "feeling great!"  Appetite and energy levels both 100%.  Denies any new or concerning neck masses.  Denies problems with dry mouth or neck lymphedema.  Denies any pain.  Bowels moving well.   Continues to abstain from alcohol and tobacco use.  He tells me it has been quite some time since he last saw his ENT physician, Dr. Wilburn Cornelia.  He last saw his radiation oncologist, Dr. Isidore Moos in 02/2017.  His port-a-cath was removed  in 02/2017.  Chart reviewed and his weight is stable, actually up ~2 lbs since last visit.   He is blind in his right eye, which is chronic and unchanged.  Otherwise he is largely without complaints today.   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    REVIEW OF SYSTEMS:  Review of Systems  Constitutional: Negative.  Negative for chills, fatigue and fever.  HENT:  Negative.  Negative for lump/mass and nosebleeds.   Eyes: Positive for eye problems.  Respiratory: Negative.  Negative for cough and shortness of breath.   Cardiovascular: Negative.  Negative for chest pain and leg swelling.  Gastrointestinal: Negative.  Negative for abdominal pain, blood in stool, constipation, diarrhea, nausea and vomiting.  Endocrine: Negative.   Genitourinary: Negative.  Negative for dysuria and hematuria.   Musculoskeletal: Negative.  Negative for arthralgias.  Skin: Negative.  Negative for rash.  Neurological: Negative.  Negative for dizziness and headaches.  Hematological: Negative.  Negative for adenopathy. Does not bruise/bleed easily.  Psychiatric/Behavioral: Negative.  Negative for depression and sleep disturbance. The patient is not nervous/anxious.      PAST MEDICAL/SURGICAL HISTORY:  Past Medical History:  Diagnosis Date  . Arthritis   . Blindness of right eye   . Cancer ([place])    pharyngeal ca  . Cataract, secondary obscuring vision    LEFT  . Graves disease   . History of shingles    RASH  02-03-2014  PER PCP RESOLVING  . Hypertension    NO MEDS ,  MONITORED BY PCP  . Hypothyroidism, postradioiodine therapy   . Prostate cancer ([place])    DX  SEVERAL  YRS   AGO  WITH RADIATION THERAPY  . Squamous cell carcinoma of head and neck ([place]) 07/12/2014  . 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 COLONOSCOPY  10/20/2011   Procedure: COLONOSCOPY;  Surgeon:  Daneil Dolin, MD;  Location[facility];  Service: Endoscopy;  Laterality: N/A;  1:00 pm  . CRYOABLATION N/A 04/07/2014   Procedure: SALVAGE CRYO ABLATION PROSTATE;  Surgeon: Lowella Bandy, MD;  Location[facility];  Service: Urology;  Laterality: N/A;  . IR GENERIC HISTORICAL  03/27/2017   IR REMOVAL TUN ACCESS W/ PORT W/O FL MOD SED [facility]-INTERV RAD  . LARYNGOSCOPY    . LYMPH NODE BIOPSY       SOCIAL HISTORY:  Social History   Social History  . Marital status: Married    Spouse name: N/A  . Number of children: N/A  . Years of education: N/A   Occupational History  . Not on file.   Social History Main Topics  . Smoking status: Former Smoker    Packs/day: 1.00    Years: 40.00    Types: Cigarettes    Quit date: 09/29/2013  . Smokeless tobacco: Never Used  . Alcohol use No  . Drug use: No  . Sexual activity: Not on file   Other Topics Concern  . Not on file   Social History Narrative  . No narrative on file    FAMILY HISTORY:  Family History  Problem Relation Age of Onset  . Colon cancer Father   . Hypertension Mother     CURRENT MEDICATIONS:  Outpatient Encounter Prescriptions as of 09/17/2017  Medication Sig  . hydrochlorothiazide (HYDRODIURIL) 12.5 MG tablet Take 1 tablet (12.5 mg total) by mouth daily.  Marland Kitchen levothyroxine (SYNTHROID, LEVOTHROID) 100 MCG tablet Take 1 tablet (100 mcg total) by mouth daily.  . pravastatin (PRAVACHOL) 40 MG tablet Take 1 tablet (40 mg total) by mouth daily.  . Vitamin D, Ergocalciferol, (DRISDOL) 50000 units CAPS capsule TAKE 1 CAPSULE EVERY 7 DAYS.  Marland Kitchen potassium chloride SA (K-DUR,KLOR-CON) 20 MEQ tablet Take 1 tablet (20 mEq total) by mouth 2 (two) times daily.  . [DISCONTINUED] sulfamethoxazole-trimethoprim (BACTRIM DS,SEPTRA DS) 800-160 MG tablet Take 1 tablet by mouth 2 (two) times daily.   No facility-administered encounter medications on file as of 09/17/2017.     ALLERGIES:  No Known Allergies   PHYSICAL  EXAM:  ECOG Performance status: 0 - Asymptomatic   Vitals:   09/17/17 1422  BP: 112/78  Pulse: 84  Resp: 16  SpO2: 95%   Filed Weights   09/17/17 1422  Weight: 139 lb (63 kg)    Physical Exam  Constitutional: He is oriented to person, place, and time and well-developed, well-nourished, and in no distress.  HENT:  Head: Normocephalic.  Mouth/Throat: No oropharyngeal exudate.  -Mild telangectasia noted to posterior oropharynx as expected s/p XRT. No concerning lesions or ulcerations.  -Patient is edentulous; no dentures in place  Eyes: No scleral icterus.  (R) eye without visible pupil or iris.   Neck: Normal range of motion. Neck supple.  Cardiovascular: Normal rate, regular rhythm and normal heart sounds.   Pulmonary/Chest: Effort normal and breath sounds normal. No respiratory distress. He has no wheezes. He has no rales.  Abdominal: Soft. Bowel sounds are normal. There is no tenderness. There is no rebound.  Musculoskeletal: Normal range of motion. He exhibits no edema.  Lymphadenopathy:       Head (right side): No submandibular and no tonsillar adenopathy present.       Head (left side): No submandibular and no tonsillar adenopathy present.    He has no cervical adenopathy.       Right cervical: No posterior cervical adenopathy present.      Left cervical: No posterior cervical adenopathy present.       Right: No supraclavicular adenopathy present.       Left: No supraclavicular adenopathy present.  Neurological: He is alert and oriented to person, place, and time. No cranial nerve deficit. Gait normal.  Skin: Skin is warm and dry. No rash noted.  Psychiatric: Mood, memory, affect and judgment normal.  Nursing note and vitals reviewed.    LABORATORY DATA:  I have reviewed the labs as listed.  CBC    Component Value Date/Time   WBC 3.9 (L) 09/17/2017 1349   RBC 5.31 09/17/2017 1349   HGB 14.3 09/17/2017 1349   HGB 16.2 04/24/2017 1027   HCT 44.3 09/17/2017 1349    HCT 48.6 04/24/2017 1027   PLT 152 09/17/2017 1349   PLT 170 04/24/2017 1027   MCV 83.4 09/17/2017 1349   MCV 81 04/24/2017 1027   MCH 26.9 09/17/2017 1349   MCHC 32.3 09/17/2017 1349   RDW 16.1 (H) 09/17/2017 1349   RDW 15.5 (H) 04/24/2017 1027   LYMPHSABS 1.4 09/17/2017 1349   LYMPHSABS 1.4 04/24/2017 1027   MONOABS 0.3 09/17/2017 1349   EOSABS 0.0 09/17/2017 1349   EOSABS 0.0 04/24/2017 1027   BASOSABS 0.0 09/17/2017 1349   BASOSABS 0.0 04/24/2017 1027   CMP Latest Ref Rng & Units 09/17/2017 04/24/2017 03/17/2017  Glucose 65 - 99 mg/dL 108(H) 82 77  BUN 6 - 20 mg/dL 16 13 19   Creatinine 0.61 - 1.24 mg/dL 0.99 0.97 1.01  Sodium 135 - 145 mmol/L 137 139 137  Potassium 3.5 - 5.1 mmol/L 3.3(L) 4.5 3.4(L)  Chloride 101 - 111 mmol/L 100(L) 96 100(L)  CO2 22 - 32 mmol/L 27 28 28   Calcium 8.9 - 10.3 mg/dL 9.3 10.1 9.4  Total Protein 6.5 - 8.1 g/dL 6.7 7.2 6.8  Total Bilirubin 0.3 - 1.2 mg/dL 0.4 0.6 0.3  Alkaline Phos 38 - 126 U/L 54 69 60  AST 15 - 41 U/L 21 21 22   ALT 17 - 63 U/L 12(L) 13 14(L)    PENDING LABS:    DIAGNOSTIC IMAGING:  *The following radiologic images and reports have been reviewed independently and agree with below findings.  Restaging PET scan: 04/25/15 CLINICAL DATA:  Subsequent treatment strategy for restaging of nasopharyngeal carcinoma.  EXAM: NUCLEAR MEDICINE PET SKULL BASE TO THIGH  TECHNIQUE: 6.5 mCi F-18 FDG was injected intravenously. Full-ring PET imaging was performed from the skull base to thigh after the radiotracer. CT data was obtained and used for attenuation correction and anatomic localization.  FASTING BLOOD GLUCOSE:  Value: 82 mg/dl  COMPARISON:  10/18/2014  FINDINGS: NECK  Diffuse moderate hypermetabolism along the left sternocleidomastoid muscle. No  well-defined residual or recurrent mass. Ill-defined soft tissue/ fat planes throughout the left side of the neck.  No hypermetabolic cervical lymph  nodes.  CHEST  No areas of abnormal hypermetabolism.  ABDOMEN/PELVIS  Left adrenal hypermetabolism is improved to resolved. No developing nodule or mass. Right perirectal and left inguinal hypermetabolic nodes have resolved.  SKELETON  No abnormal marrow activity.  CT IMAGES PERFORMED FOR ATTENUATION CORRECTION  Right globe prosthesis.  Right Port-A-Cath which terminates at the high right atrium. Coronary artery atherosclerosis.  Centrilobular emphysema with biapical scarring.  Right middle lobe pulmonary nodule is unchanged at 6 mm on image 63.  Bilateral low-density renal lesions which are likely cysts. Degenerative partial fusion of the left sacroiliac joint. Bilateral shoulder osteoarthritis.  IMPRESSION: 1. No evidence of hypermetabolic recurrent or metastatic disease. 2. Diffuse left sternocleidomastoid moderate hypermetabolism is likely radiation induced. 3. Resolution of right perirectal and left inguinal nodal hypermetabolism. These nodes were likely reactive. 4. Emphysema with similar indeterminate right middle lobe 5 mm pulmonary nodule.   Electronically Signed   By: Abigail Miyamoto M.D.   On: 04/25/2015 12:07   PATHOLOGY:  (L) neck biopsy: 05/25/14       ASSESSMENT & PLAN:   Stage IVB squamous cell carcinoma with (L) neck mets/unknown primary (likely H&N origin); p16-:  -Diagnosed in 04/2014. Treated with definitive radiation, completing treatment on 12/25/14.   -He is now ~2.5 years out from completing treatment for H&N cancer without evidence of recurrence; this is very favorable.  -Reviewed surveillance guidelines with him, which will include visits every 6-12 months for a total of 5 years post-treatment (11/2019). At that time, he can choose to be "graduated" from follow-up at cancer center if he chooses.   -It has been quite some time since he has seen his ENT physician, Dr. Wilburn Cornelia. I will refer him back to see ENT for  additional follow-up. Will defer to ENT if they feel he can be discharged from their clinic, but would like to close the loop there.  Patient agreed with this plan.  -Maintain follow-up with radiation oncology as directed.  -Return to cancer center in 6 months for follow-up with labs.   Hypothyroidism:  -Most recent TSH in 03/2017 was normal at 0.831. He remains on Synthroid; no dose-adjustments necessary at this time.  -Will add-on TSH to be checked at next follow-up visit. Continue current dose Synthroid.   Hypokalemia:  -Likely secondary to HCTZ for his blood pressure.  Will provide him with 2 weeks worth of potassium supplementation with K-Dur 20 mEq BID, then I would like for him to continue follow-up for his hypokalemia with his PCP. He agreed.   At risk for neck lymphedema:  -None noted on physical exam today; will keep monitoring and will refer to PT if needed in the future.   Oral care:  -Edentulous, therefore no role for fluoride trays.  -Denies problems with xerostomia; continue his current oral care regimen.   History of prostate cancer:  -Managed by urology/PCP.      Dispo:  -Return to cancer center in 6 months for follow-up with labs.    All questions were answered to patient's stated satisfaction. Encouraged patient to call with any new concerns or questions before his next visit to the cancer center and we can certain see him sooner, if needed.    Plan of care discussed with Dr. Talbert Cage, who agrees with the above aforementioned.    Orders placed this encounter:  Orders Placed This Encounter  Procedures  . Basic metabolic panel  . Comprehensive metabolic panel  . TSH      Mike Craze, NP [place] [phone]

## 2017-09-17 NOTE — Patient Instructions (Addendum)
[place] [facility] at [facility] Discharge Instructions  RECOMMENDATIONS MADE BY THE CONSULTANT AND ANY TEST RESULTS WILL BE SENT TO YOUR REFERRING PHYSICIAN.  You were seen today by Mike Craze NP. Please make sure that you are taking you Levothyroxine on an empty stomach, an hour before breakfast and no other medications with it.  Please make sure that you are taking your potassium as well.   Thank you for choosing [place] at [facility] to provide your oncology and hematology care.  To afford each patient quality time with our provider, please arrive at least 15 minutes before your scheduled appointment time.    If you have a lab appointment with the [facility] please come in thru the  Main Entrance and check in at the main information desk  You need to re-schedule your appointment should you arrive 10 or more minutes late.  We strive to give you quality time with our providers, and arriving late affects you and other patients whose appointments are after yours.  Also, if you no show three or more times for appointments you may be dismissed from the clinic at the providers discretion.     Again, thank you for choosing [facility].  Our hope is that these requests will decrease the amount of time that you wait before being seen by our physicians.       _____________________________________________________________  Should you have questions after your visit to [facility], please contact our office at [phone] between the hours of 8:30 a.m. and 4:30 p.m.  Voicemails left after 4:30 p.m. will not be returned until the following business day.  For prescription refill requests, have your pharmacy contact our office.       Resources For Cancer Patients and their Caregivers ? American Cancer Society: Can assist with transportation, wigs, general needs, runs Look Good Feel Better.        (813) 760-8690 ? Cancer  Care: Provides financial assistance, online support groups, medication/co-pay assistance.  1-800-813-HOPE 548-386-6252) ? [facility] Assists [place] cancer patients and their families through emotional , educational and financial support.  (670) 116-8420 ? [place] DSS Where to apply for food stamps, Medicaid and utility assistance. 518-104-0548 ? [employer]: Transportation to medical appointments. 5057534592 ? Social Security Administration: May apply for disability if have a Stage IV cancer. (970)040-4579 276-332-4279 ? [employer], Disability and Transit Services: Assists with nutrition, care and transit needs. [place] Support Programs: @10RELATIVEDAYS @ > Cancer Support Group  2nd Tuesday of the month 1pm-2pm, Journey Room  > Creative Journey  3rd Tuesday of the month 1130am-1pm, Journey Room  > Look Good Feel Better  1st Wednesday of the month 10am-12 noon, Journey Room (Call [place] to register [phone])

## 2017-09-24 ENCOUNTER — Other Ambulatory Visit: Payer: Self-pay | Admitting: Family

## 2017-09-24 NOTE — Telephone Encounter (Signed)
Last vitamin D was 07/2015

## 2017-09-29 DIAGNOSIS — Z8589 Personal history of malignant neoplasm of other organs and systems: Secondary | ICD-10-CM | POA: Diagnosis not present

## 2017-10-26 ENCOUNTER — Ambulatory Visit: Payer: Medicare HMO | Admitting: Family Medicine

## 2017-10-26 ENCOUNTER — Encounter: Payer: Self-pay | Admitting: Family Medicine

## 2017-10-26 ENCOUNTER — Ambulatory Visit (INDEPENDENT_AMBULATORY_CARE_PROVIDER_SITE_OTHER): Payer: Medicare HMO | Admitting: Family Medicine

## 2017-10-26 VITALS — BP 127/81 | HR 67 | Temp 97.1°F | Ht 72.0 in | Wt 138.0 lb

## 2017-10-26 DIAGNOSIS — I10 Essential (primary) hypertension: Secondary | ICD-10-CM

## 2017-10-26 DIAGNOSIS — Z23 Encounter for immunization: Secondary | ICD-10-CM

## 2017-10-26 DIAGNOSIS — E0500 Thyrotoxicosis with diffuse goiter without thyrotoxic crisis or storm: Secondary | ICD-10-CM | POA: Diagnosis not present

## 2017-10-26 DIAGNOSIS — E785 Hyperlipidemia, unspecified: Secondary | ICD-10-CM

## 2017-10-26 DIAGNOSIS — E039 Hypothyroidism, unspecified: Secondary | ICD-10-CM | POA: Diagnosis not present

## 2017-10-26 NOTE — Progress Notes (Signed)
BP 127/81   Pulse 67   Temp (!) 97.1 F (36.2 C) (Oral)   Ht 6' (1.829 m)   Wt 138 lb (62.6 kg)   BMI 18.72 kg/m    Subjective:    Patient ID: Brandon Hall, male    DOB: 09/04/1956, 61 y.o.   MRN: [id number]  HPI: NESTER WIENCEK is a 61 y.o. male presenting on 10/26/2017 for Hypothyroidism (6 mo); Hypertension; and Hyperlipidemia   HPI Hypertension Patient is currently on hydrochlorothiazide, and their blood pressure today is 127/81. Patient denies any lightheadedness or dizziness. Patient denies headaches, blurred vision, chest pains, shortness of breath, or weakness. Denies any side effects from medication and is content with current medication.   Hypothyroidism/Graves recheck Patient is coming in for thyroid recheck today as well. They deny any issues with hair changes or heat or cold problems or diarrhea or constipation. They deny any chest pain or palpitations. They are currently on levothyroxine   Hyperlipidemia Patient is coming in for recheck of his hyperlipidemia. The patient is currently taking pravastatin. They deny any issues with myalgias or history of liver damage from it. They deny any focal numbness or weakness or chest pain.   Relevant past medical, surgical, family and social history reviewed and updated as indicated. Interim medical history since our last visit reviewed. Allergies and medications reviewed and updated.  Review of Systems  Constitutional: Negative for chills and fever.  Eyes: Negative for discharge.  Respiratory: Negative for shortness of breath and wheezing.   Cardiovascular: Negative for chest pain and leg swelling.  Endocrine: Negative for cold intolerance and heat intolerance.  Musculoskeletal: Negative for back pain and gait problem.  Skin: Negative for rash.  Neurological: Negative for dizziness, weakness, light-headedness, numbness and headaches.  All other systems reviewed and are negative.   Per HPI unless specifically  indicated above   Allergies as of 10/26/2017   No Known Allergies     Medication List       Accurate as of 10/26/17 11:45 AM. Always use your most recent med list.          hydrochlorothiazide 12.5 MG tablet Commonly known as:  HYDRODIURIL Take 1 tablet (12.5 mg total) by mouth daily.   levothyroxine 100 MCG tablet Commonly known as:  SYNTHROID, LEVOTHROID Take 1 tablet (100 mcg total) by mouth daily.   potassium chloride SA 20 MEQ tablet Commonly known as:  K-DUR,KLOR-CON Take 1 tablet (20 mEq total) by mouth 2 (two) times daily.   pravastatin 40 MG tablet Commonly known as:  PRAVACHOL Take 1 tablet (40 mg total) by mouth daily.   Vitamin D (Ergocalciferol) 50000 units Caps capsule Commonly known as:  DRISDOL TAKE 1 CAPSULE EVERY 7 DAYS.          Objective:    BP 127/81   Pulse 67   Temp (!) 97.1 F (36.2 C) (Oral)   Ht 6' (1.829 m)   Wt 138 lb (62.6 kg)   BMI 18.72 kg/m   Wt Readings from Last 3 Encounters:  10/26/17 138 lb (62.6 kg)  09/17/17 139 lb (63 kg)  08/10/17 137 lb (62.1 kg)    Physical Exam  Constitutional: He is oriented to person, place, and time. He appears well-developed and well-nourished. No distress.  Eyes: Conjunctivae are normal. No scleral icterus.  Neck: Neck supple. No thyromegaly present.  Cardiovascular: Normal rate, regular rhythm, normal heart sounds and intact distal pulses.   No murmur heard. Pulmonary/Chest: Effort  normal and breath sounds normal. No respiratory distress. He has no wheezes. He has no rales.  Musculoskeletal: Normal range of motion. He exhibits no edema.  Lymphadenopathy:    He has no cervical adenopathy.  Neurological: He is alert and oriented to person, place, and time. Coordination normal.  Skin: Skin is warm and dry. No rash noted. He is not diaphoretic.  Psychiatric: He has a normal mood and affect. His behavior is normal.  Nursing note and vitals reviewed.       Assessment & Plan:   Problem  List Items Addressed This Visit      Cardiovascular and Mediastinum   Hypertension - Primary   Relevant Orders   CMP14+EGFR (Completed)     Endocrine   Graves disease   Relevant Orders   TSH (Completed)   Hypothyroid   Relevant Orders   TSH (Completed)     Other   Dyslipidemia   Relevant Orders   Lipid panel (Completed)    Other Visit Diagnoses    Need for immunization against influenza       Relevant Orders   Flu Vaccine QUAD 36+ mos IM (Completed)       Follow up plan: Return in about 6 months (around 04/26/2018), or if symptoms worsen or fail to improve, for Thyroid and hypertension recheck.  Counseling provided for all of the vaccine components Orders Placed This Encounter  Procedures  . CMP14+EGFR  . Lipid panel  . TSH    Arville Care, MD [facility] 10/26/2017, 11:45 AM

## 2017-10-27 LAB — CMP14+EGFR
ALT: 19 IU/L (ref 0–44)
AST: 28 IU/L (ref 0–40)
Albumin/Globulin Ratio: 1.9 (ref 1.2–2.2)
Albumin: 4.7 g/dL (ref 3.6–4.8)
Alkaline Phosphatase: 67 IU/L (ref 39–117)
BUN/Creatinine Ratio: 12 (ref 10–24)
BUN: 14 mg/dL (ref 8–27)
Bilirubin Total: 0.7 mg/dL (ref 0.0–1.2)
CALCIUM: 9.7 mg/dL (ref 8.6–10.2)
CHLORIDE: 96 mmol/L (ref 96–106)
CO2: 25 mmol/L (ref 20–29)
Creatinine, Ser: 1.15 mg/dL (ref 0.76–1.27)
GFR, EST AFRICAN AMERICAN: 78 mL/min/{1.73_m2} (ref >59)
GFR, EST NON AFRICAN AMERICAN: 68 mL/min/{1.73_m2} (ref >59)
GLUCOSE: 70 mg/dL (ref 65–99)
Globulin, Total: 2.5 g/dL (ref 1.5–4.5)
POTASSIUM: 4.1 mmol/L (ref 3.5–5.2)
Sodium: 141 mmol/L (ref 134–144)
TOTAL PROTEIN: 7.2 g/dL (ref 6.0–8.5)

## 2017-10-27 LAB — LIPID PANEL
CHOLESTEROL TOTAL: 162 mg/dL (ref 100–199)
Chol/HDL Ratio: 2.4 ratio (ref 0.0–5.0)
HDL: 68 mg/dL (ref >39)
LDL CALC: 85 mg/dL (ref 0–99)
Triglycerides: 47 mg/dL (ref 0–149)
VLDL CHOLESTEROL CAL: 9 mg/dL (ref 5–40)

## 2017-10-27 LAB — TSH: TSH: 2.05 u[IU]/mL (ref 0.450–4.500)

## 2017-11-16 DIAGNOSIS — C61 Malignant neoplasm of prostate: Secondary | ICD-10-CM | POA: Diagnosis not present

## 2017-11-24 ENCOUNTER — Ambulatory Visit: Payer: Medicare HMO | Admitting: Urology

## 2017-11-24 DIAGNOSIS — R9721 Rising PSA following treatment for malignant neoplasm of prostate: Secondary | ICD-10-CM

## 2017-11-24 DIAGNOSIS — C61 Malignant neoplasm of prostate: Secondary | ICD-10-CM | POA: Diagnosis not present

## 2017-12-03 ENCOUNTER — Ambulatory Visit (INDEPENDENT_AMBULATORY_CARE_PROVIDER_SITE_OTHER): Payer: Medicare HMO | Admitting: *Deleted

## 2017-12-03 ENCOUNTER — Encounter: Payer: Self-pay | Admitting: *Deleted

## 2017-12-03 VITALS — BP 118/75 | HR 68 | Ht 71.0 in | Wt 138.0 lb

## 2017-12-03 DIAGNOSIS — Z0000 Encounter for general adult medical examination without abnormal findings: Secondary | ICD-10-CM

## 2017-12-03 DIAGNOSIS — Z1211 Encounter for screening for malignant neoplasm of colon: Secondary | ICD-10-CM

## 2017-12-03 NOTE — Patient Instructions (Addendum)
  Mr. Ribas , Thank you for taking time to come for your Medicare Wellness Visit. I appreciate your ongoing commitment to your health goals. Please review the following plan we discussed and let me know if I can assist you in the future.   These are the goals we discussed: Continue to stay active. Make sure to stay hydrated, especially during the summer months.    This is a list of the screening recommended for you and due dates:  Health Maintenance  Topic Date Due  . HIV Screening  03/29/1970  . Colon Cancer Screening  10/19/2021  . Tetanus Vaccine  10/01/2023  . Flu Shot  Completed  .  Hepatitis C: One time screening is recommended by Center for Disease Control  (CDC) for  adults born from [birth year] through 1966.   Completed   A referral for a colonoscopy was placed today. You should receive a call by the end of next week to schedule the appointment.

## 2017-12-04 ENCOUNTER — Telehealth: Payer: Self-pay

## 2017-12-04 NOTE — Telephone Encounter (Signed)
Patient has a reported family history of colon cancer in his father and 2 brothers. His father died in his 26s and his brother died at 63. Patient is a poor historian but was accompanied by a family member that helped to provide history at his last visit. We wanted to make sure that Dr Sydell Axon is aware of the family history because that may affect how frequently he needs to be screened. I will route back to Burnadette Peter, LPN with Dr Rourke's office just to make sure they are aware of the family history.

## 2017-12-04 NOTE — Telephone Encounter (Signed)
We received a referral from [facility] that this pt needed a colonoscopy. Pt had one in 09/2011 and per Dr.Rourks documentation,  is not due for another one until 09/2021. I called and informed the pt, he stated he was not having any problems at this time, but if he develops any problems he will call us. Advised him that we had it on our recall and he will be notified when it is time for him to have it done. pts wife is also aware.  Routing to Dr.Rourk and Hildred Laser for [employer].

## 2017-12-04 NOTE — Telephone Encounter (Signed)
With new information about family history of CRC, patient should have a high risk screening colonoscopy now. Thanks for the update.

## 2017-12-04 NOTE — Telephone Encounter (Signed)
Routing to Dr.Rourk for review.

## 2017-12-10 ENCOUNTER — Encounter: Payer: Self-pay | Admitting: Internal Medicine

## 2017-12-10 NOTE — Telephone Encounter (Signed)
I spoke with the pts wife and explained everything to her, informed her that he would need a nurse visit in January and she said that was ok with them. She said make sure we send them a letter with the date and time so they dont forget.  Erline Levine, please schedule a nurse visit for this pt to be triaged.

## 2017-12-10 NOTE — Telephone Encounter (Signed)
PATIENT SCHEDULED  °

## 2017-12-11 NOTE — Progress Notes (Signed)
Subjective:   Brandon Hall is a 61 y.o. male who presents for an Initial Medicare Annual Wellness Visit. [name] lives in an apartment with his wife. They walk around town but have access to transportation if needed.   Review of Systems  Health is about the same as last year.   Cardiac Risk Factors include: male gender;advanced age (>74men, >42 women);hypertension   Other systems negative  Objective:    Today's Vitals   12/11/17 1648  BP: 118/75  Pulse: 68  Weight: 138 lb (62.6 kg)  Height: 5\' 11"  (1.803 m)   Body mass index is 19.25 kg/m.  Advanced Directives 09/17/2017 03/27/2017 03/17/2017 03/06/2017 01/12/2017 09/17/2016 07/07/2016  Does Patient Have a Medical Advance Directive? No No No No No No No  Would patient like information on creating a medical advance directive? No - Patient declined No - Patient declined No - Patient declined No - Patient declined No - Patient declined No - patient declined information No - patient declined information  Pre-existing out of facility DNR order (yellow form or pink MOST form) - - - - - - -    Current Medications (verified) Outpatient Encounter Medications as of 12/03/2017  Medication Sig  . hydrochlorothiazide (HYDRODIURIL) 12.5 MG tablet Take 1 tablet (12.5 mg total) by mouth daily.  Marland Kitchen levothyroxine (SYNTHROID, LEVOTHROID) 100 MCG tablet Take 1 tablet (100 mcg total) by mouth daily.  . potassium chloride SA (K-DUR,KLOR-CON) 20 MEQ tablet Take 1 tablet (20 mEq total) by mouth 2 (two) times daily.  . pravastatin (PRAVACHOL) 40 MG tablet Take 1 tablet (40 mg total) by mouth daily.  . Vitamin D, Ergocalciferol, (DRISDOL) 50000 units CAPS capsule TAKE 1 CAPSULE EVERY 7 DAYS.   No facility-administered encounter medications on file as of 12/03/2017.     Allergies (verified) Patient has no known allergies.   History: Past Medical History:  Diagnosis Date  . Arthritis   . Blindness of right eye   . Cancer ([place])    pharyngeal ca  .  Cataract, secondary obscuring vision    LEFT  . Graves disease   . History of shingles    RASH  02-03-2014  PER PCP RESOLVING  . Hypertension    NO MEDS ,  MONITORED BY PCP  . Hypothyroidism, postradioiodine therapy   . Prostate cancer ([place])    DX  SEVERAL  YRS   AGO  WITH RADIATION THERAPY  . Squamous cell carcinoma of head and neck ([place]) 07/12/2014  . 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 COLONOSCOPY  10/20/2011   Procedure: COLONOSCOPY;  Surgeon: Daneil Dolin, MD;  [facility];  Service: Endoscopy;  Laterality: N/A;  1:00 pm  . CRYOABLATION N/A 04/07/2014   Procedure: SALVAGE CRYO ABLATION PROSTATE;  Surgeon: Lowella Bandy, MD;  Location: [facility];  Service: Urology;  Laterality: N/A;  . IR GENERIC HISTORICAL  03/27/2017   IR REMOVAL TUN ACCESS W/ PORT W/O FL MOD SED [facility]-INTERV RAD  . LARYNGOSCOPY    . LYMPH NODE BIOPSY     Family History  Problem Relation Age of Onset  . Colon cancer Father   . Hypertension Mother   . Colon cancer Brother   . Colon cancer Brother    Social History   Socioeconomic History  . Marital status: Married    Spouse name: Not on file  . Number of children: Not on file  .  Years of education: Not on file  . Highest education level: Not on file  Social Needs  . Financial resource strain: Not very hard  . Food insecurity - worry: Never true  . Food insecurity - inability: Never true  . Transportation needs - medical: No  . Transportation needs - non-medical: No  Occupational History  . Occupation: disabled  Tobacco Use  . Smoking status: Former Smoker    Packs/day: 1.00    Years: 40.00    Pack years: 40.00    Types: Cigarettes    Last attempt to quit: 09/29/2013    Years since quitting: 4.2  . Smokeless tobacco: Never Used  Substance and Sexual Activity  . Alcohol use: No  . Drug use: No  . Sexual activity: Not on file  Other Topics Concern    . Not on file  Social History Narrative   Lives with wife in an apartment. He has stepchildren. They walk most everywhere they go locally but have access to transportation if needed.   Clinical Intake:    Pain : No/denies pain  How often do you need to have someone help you when you read instructions, pamphlets, or other written materials from your doctor or pharmacy?: 3 - Sometimes  Interpreter Needed?: No   Activities of Daily Living In your present state of health, do you have any difficulty performing the following activities: 12/03/2017 03/27/2017  Hearing? N N  Vision? N Y  Comment Last eye exam was last year. Appt scheduled with Gershon Crane in January 2019.  -  Difficulty concentrating or making decisions? N N  Walking or climbing stairs? N N  Dressing or bathing? N N  Doing errands, shopping? N -  Preparing Food and eating ? N -  Using the Toilet? N -  In the past six months, have you accidently leaked urine? N -  Do you have problems with loss of bowel control? N -  Managing your Medications? N -  Managing your Finances? N -  Housekeeping or managing your Housekeeping? N -  Some recent data might be hidden     Immunizations and Health Maintenance Immunization History  Administered Date(s) Administered  . Influenza,inj,Quad PF,6+ Mos 09/30/2013, 01/04/2015, 10/12/2015, 09/17/2016, 10/26/2017   Health Maintenance Due  Topic Date Due  . HIV Screening  03/29/1970    Patient Care Team: Dettinger, Fransisca Kaufmann, MD as PCP - General (Family Medicine) Jerrell Belfast, MD as Consulting Physician (Otolaryngology) Heath Lark, MD as Consulting Physician (Hematology and Oncology) Eppie Gibson, MD as Attending Physician (Radiation Oncology) Franchot Gallo, MD as Consulting Physician (Urology)  No hospitalizations, ER visits, or surgeries this past year    Assessment:   This is a routine wellness examination for [name].  Hearing/Vision screen No deficits noted during  visit  Dietary issues and exercise activities discussed: Current Exercise Habits: Home exercise routine, Type of exercise: walking, Time (Minutes): 60, Frequency (Times/Week): 7, Weekly Exercise (Minutes/Week): 420, Intensity: Moderate  Goal Increase water intake  Depression Screen PHQ 2/9 Scores 12/03/2017 08/10/2017 04/24/2017 03/06/2017  PHQ - 2 Score 0 0 0 0  PHQ- 9 Score - - - -    Fall Risk Fall Risk  12/03/2017 08/10/2017 04/24/2017 03/06/2017 10/24/2016  Falls in the past year? No No No No No  Cognitive Function:   Unable to complete MMSE due to literacy and comprehension level   Screening Tests Health Maintenance  Topic Date Due  . HIV Screening  03[birth year]29[birth year]1970  . COLONOSCOPY  10/19/2021  .  TETANUS/TDAP  10/01/2023  . INFLUENZA VACCINE  Completed  . Hepatitis C Screening  Completed      Plan:  Increase water intake Move carefully to avoid falls Keep f/u with PCP FOBT given  I have personally reviewed and noted the following in the patient's chart:   . Medical and social history . Use of alcohol, tobacco or illicit drugs  . Current medications and supplements . Functional ability and status . Nutritional status . Physical activity . Advanced directives . List of other physicians . Hospitalizations, surgeries, and ER visits in previous 12 months . Vitals . Screenings to include cognitive, depression, and falls . Referrals and appointments  In addition, I have reviewed and discussed with patient certain preventive protocols, quality metrics, and best practice recommendations. A written personalized care plan for preventive services as well as general preventive health recommendations were provided to patient.     Chong Sicilian, RN   12/11/2017

## 2018-01-01 ENCOUNTER — Encounter: Payer: Self-pay | Admitting: *Deleted

## 2018-01-05 DIAGNOSIS — H524 Presbyopia: Secondary | ICD-10-CM | POA: Diagnosis not present

## 2018-01-05 DIAGNOSIS — H5212 Myopia, left eye: Secondary | ICD-10-CM | POA: Diagnosis not present

## 2018-01-05 DIAGNOSIS — H52202 Unspecified astigmatism, left eye: Secondary | ICD-10-CM | POA: Diagnosis not present

## 2018-01-07 ENCOUNTER — Ambulatory Visit: Payer: Medicare HMO

## 2018-01-13 ENCOUNTER — Ambulatory Visit (INDEPENDENT_AMBULATORY_CARE_PROVIDER_SITE_OTHER): Payer: Self-pay

## 2018-01-13 DIAGNOSIS — Z1211 Encounter for screening for malignant neoplasm of colon: Secondary | ICD-10-CM

## 2018-01-13 MED ORDER — PEG 3350-KCL-NA BICARB-NACL 420 G PO SOLR: 4000.0000 mL | ORAL | 0 refills | Status: DC

## 2018-01-13 NOTE — Progress Notes (Signed)
Gastroenterology Pre-Procedure Review  Request Date:01/13/18 Requesting Physician: Dr.Dettinger MD  PATIENT REVIEW QUESTIONS: The patient responded to the following health history questions as indicated:    1. Diabetes Melitis: no 2. Joint replacements in the past 12 months: no 3. Major health problems in the past 3 months: no 4. Has an artificial valve or MVP: no 5. Has a defibrillator: no 6. Has been advised in past to take antibiotics in advance of a procedure like teeth cleaning: no 7. Family history of colon cancer: yes (2 brothers early 8's)  50. Alcohol Use: no 9. History of sleep apnea: no  10. History of coronary artery or other vascular stents placed within the last 12 months: no 11. History of any prior anesthesia complications: no    MEDICATIONS & ALLERGIES:    Patient reports the following regarding taking any blood thinners:   Plavix? no Aspirin? no Coumadin? no Brilinta? no Xarelto? no Eliquis? no Pradaxa? no Savaysa? no Effient? no  Patient confirms/reports the following medications:  Current Outpatient Medications  Medication Sig Dispense Refill  . hydrochlorothiazide (HYDRODIURIL) 12.5 MG tablet Take 1 tablet (12.5 mg total) by mouth daily. 90 tablet 3  . levothyroxine (SYNTHROID, LEVOTHROID) 100 MCG tablet Take 1 tablet (100 mcg total) by mouth daily. 90 tablet 3  . potassium chloride SA (K-DUR,KLOR-CON) 20 MEQ tablet Take 1 tablet (20 mEq total) by mouth 2 (two) times daily. 30 tablet 0  . pravastatin (PRAVACHOL) 40 MG tablet Take 1 tablet (40 mg total) by mouth daily. 90 tablet 3  . Vitamin D, Ergocalciferol, (DRISDOL) 50000 units CAPS capsule TAKE 1 CAPSULE EVERY 7 DAYS. 12 capsule 0   No current facility-administered medications for this visit.     Patient confirms/reports the following allergies:  No Known Allergies  No orders of the defined types were placed in this encounter.   AUTHORIZATION INFORMATION Primary Insurance: [place],  [place]  #[id number] Pre-Cert / Josem Kaufmann required: no   SCHEDULE INFORMATION: Procedure has been scheduled as follows:  Date: 02/03/18, Time: 2:15 pt asked for this time, his family can only bring him after 12:00. Location: [facility] Dr.Rourk  This Gastroenterology Pre-Precedure Review Form is being routed to the following provider(s): Neil Crouch PA-C

## 2018-01-13 NOTE — Patient Instructions (Signed)
Brandon Hall   [birth year] MRN: [id number]    Procedure Date: 02/03/18  Time to register: 1:15 Place to register: [facility] Procedure Time: 2:15 Scheduled provider: Dr.Rourk  PREPARATION FOR COLONOSCOPY WITH TRI-LYTE SPLIT PREP  Please notify us immediately if you are diabetic, take iron supplements, or if you are on Coumadin or any other blood thinners.   Please hold the following medications: none  You will need to purchase 1 fleet enema and 1 box of Bisacodyl 5mg  tablets.   2 DAYS BEFORE PROCEDURE:  DATE: 02/01/18   DAY: Monday Begin clear liquid diet AFTER your lunch meal. NO SOLID FOODS after this point.  1 DAY BEFORE PROCEDURE:  DATE: 02/02/18   DAY: Tuesday Continue clear liquids the entire day - NO SOLID FOOD.   Diabetic medications adjustments for today: none  At 2:00 pm:  Take 2 Bisacodyl tablets.   At 4:00pm:  Start drinking your solution. Make sure you mix well per instructions on the bottle. Try to drink 1 (one) 8 ounce glass every 10-15 minutes until you have consumed HALF the jug. You should complete by 6:00pm.You must keep the left over solution refrigerated until completed next day.  Continue clear liquids. You must drink plenty of clear liquids to prevent dehyration and kidney failure. Nothing to eat or drink after midnight.  EXCEPTION: If you take medications for your heart, blood pressure or breathing, you may take these medications with a small amount of clear liquid.    DAY OF PROCEDURE:   DATE: 02/03/18   DAY: Wednesday  Diabetic medications adjustments for today: none  Five hours before your procedure time @9 :00 am:  Finish remaining amout of bowel prep, drinking 1 (one) 8 ounce glass every 10-15 minutes until complete. You have two hours to consume remaining prep.   Three hours before your procedure time @11 :15 pm:  Nothing by mouth.   At least one hour before going to the hospital:  Give yourself one Fleet enema. You may take your morning  medications with sip of water unless we have instructed otherwise.      Please see below for Dietary Information.  CLEAR LIQUIDS INCLUDE:  Water Jello (NOT red in color)   Ice Popsicles (NOT red in color)   Tea (sugar ok, no milk/cream) Powdered fruit flavored drinks  Coffee (sugar ok, no milk/cream) Gatorade/ Lemonade/ Kool-Aid  (NOT red in color)   Juice: apple, white grape, white cranberry Soft drinks  Clear bullion, consomme, broth (fat free beef/chicken/vegetable)  Carbonated beverages (any kind)  Strained chicken noodle soup Hard Candy   Remember: Clear liquids are liquids that will allow you to see your fingers on the other side of a clear glass. Be sure liquids are NOT red in color, and not cloudy, but CLEAR.  DO NOT EAT OR DRINK ANY OF THE FOLLOWING:  Dairy products of any kind   Cranberry juice Tomato juice / V8 juice   Grapefruit juice Orange juice     Red grape juice  Do not eat any solid foods, including such foods as: cereal, oatmeal, yogurt, fruits, vegetables, creamed soups, eggs, bread, crackers, pureed foods in a blender, etc.   HELPFUL HINTS FOR DRINKING PREP SOLUTION:   Make sure prep is extremely cold. Mix and refrigerate the the morning of the prep. You may also put in the freezer.   You may try mixing some Crystal Light or Country Time Lemonade if you prefer. Mix in small amounts; add more if necessary.  Try drinking through a straw  Rinse mouth with water or a mouthwash between glasses, to remove after-taste.  Try sipping on a cold beverage /ice/ popsicles between glasses of prep.  Place a piece of sugar-free hard candy in mouth between glasses.  If you become nauseated, try consuming smaller amounts, or stretch out the time between glasses. Stop for 30-60 minutes, then slowly start back drinking.        OTHER INSTRUCTIONS  You will need a responsible adult at least 63 years of age to accompany you and drive you home. This person must remain  in the waiting room during your procedure. The hospital will cancel your procedure if you do not have a responsible adult with you.   1. Wear loose fitting clothing that is easily removed. 2. Leave jewelry and other valuables at home.  3. Remove all body piercing jewelry and leave at home. 4. Total time from sign-in until discharge is approximately 2-3 hours. 5. You should go home directly after your procedure and rest. You can resume normal activities the day after your procedure. 6. The day of your procedure you should not:  Drive  Make legal decisions  Operate machinery  Drink alcohol  Return to work   You may call the office (Dept: [phone]) before 5:00pm, or page the doctor on call [phone]) after 5:00pm, for further instructions, if necessary.   Insurance Information YOU WILL NEED TO CHECK WITH YOUR INSURANCE COMPANY FOR THE BENEFITS OF COVERAGE YOU HAVE FOR THIS PROCEDURE.  UNFORTUNATELY, NOT ALL INSURANCE COMPANIES HAVE BENEFITS TO COVER ALL OR PART OF THESE TYPES OF PROCEDURES.  IT IS YOUR RESPONSIBILITY TO CHECK YOUR BENEFITS, HOWEVER, WE WILL BE GLAD TO ASSIST YOU WITH ANY CODES YOUR INSURANCE COMPANY MAY NEED.    PLEASE NOTE THAT MOST INSURANCE COMPANIES WILL NOT COVER A SCREENING COLONOSCOPY FOR PEOPLE UNDER THE AGE OF 50  IF YOU HAVE BCBS INSURANCE, YOU MAY HAVE BENEFITS FOR A SCREENING COLONOSCOPY BUT IF POLYPS ARE FOUND THE DIAGNOSIS WILL CHANGE AND THEN YOU MAY HAVE A DEDUCTIBLE THAT WILL NEED TO BE MET. SO PLEASE MAKE SURE YOU CHECK YOUR BENEFITS FOR A SCREENING COLONOSCOPY AS WELL AS A DIAGNOSTIC COLONOSCOPY.

## 2018-01-14 NOTE — Progress Notes (Signed)
With marginal prep in 2012 and FH of CRC, agree with TCS now. Ok to schedule TCS.

## 2018-01-15 ENCOUNTER — Other Ambulatory Visit: Payer: Self-pay | Admitting: Family Medicine

## 2018-02-03 ENCOUNTER — Encounter (HOSPITAL_COMMUNITY): Payer: Self-pay | Admitting: *Deleted

## 2018-02-03 ENCOUNTER — Other Ambulatory Visit: Payer: Self-pay

## 2018-02-03 ENCOUNTER — Encounter (HOSPITAL_COMMUNITY)
Admission: RE | Disposition: A | Discharge status: Home / Self Care | Payer: Self-pay | Source: Ambulatory Visit | Attending: Internal Medicine

## 2018-02-03 ENCOUNTER — Ambulatory Visit (HOSPITAL_COMMUNITY)
Admission: RE | Admit: 2018-02-03 | Discharge: 2018-02-03 | Disposition: A | Discharge status: Home / Self Care | Payer: Medicare HMO | Source: Ambulatory Visit | Attending: Internal Medicine | Admitting: Internal Medicine

## 2018-02-03 DIAGNOSIS — Z87891 Personal history of nicotine dependence: Secondary | ICD-10-CM | POA: Insufficient documentation

## 2018-02-03 DIAGNOSIS — Z800 Family history of malignant neoplasm of digestive organs: Secondary | ICD-10-CM | POA: Diagnosis not present

## 2018-02-03 DIAGNOSIS — E039 Hypothyroidism, unspecified: Secondary | ICD-10-CM | POA: Diagnosis not present

## 2018-02-03 DIAGNOSIS — K5730 Diverticulosis of large intestine without perforation or abscess without bleeding: Secondary | ICD-10-CM | POA: Insufficient documentation

## 2018-02-03 DIAGNOSIS — Z1212 Encounter for screening for malignant neoplasm of rectum: Secondary | ICD-10-CM

## 2018-02-03 DIAGNOSIS — H5461 Unqualified visual loss, right eye, normal vision left eye: Secondary | ICD-10-CM | POA: Diagnosis not present

## 2018-02-03 DIAGNOSIS — I10 Essential (primary) hypertension: Secondary | ICD-10-CM | POA: Insufficient documentation

## 2018-02-03 DIAGNOSIS — Z1211 Encounter for screening for malignant neoplasm of colon: Secondary | ICD-10-CM | POA: Diagnosis not present

## 2018-02-03 DIAGNOSIS — E0500 Thyrotoxicosis with diffuse goiter without thyrotoxic crisis or storm: Secondary | ICD-10-CM | POA: Diagnosis not present

## 2018-02-03 DIAGNOSIS — Z85818 Personal history of malignant neoplasm of other sites of lip, oral cavity, and pharynx: Secondary | ICD-10-CM | POA: Diagnosis not present

## 2018-02-03 DIAGNOSIS — Z8619 Personal history of other infectious and parasitic diseases: Secondary | ICD-10-CM | POA: Diagnosis not present

## 2018-02-03 DIAGNOSIS — Z79899 Other long term (current) drug therapy: Secondary | ICD-10-CM | POA: Insufficient documentation

## 2018-02-03 DIAGNOSIS — Z8546 Personal history of malignant neoplasm of prostate: Secondary | ICD-10-CM | POA: Insufficient documentation

## 2018-02-03 HISTORY — PX: COLONOSCOPY: SHX5424

## 2018-02-03 SURGERY — COLONOSCOPY
Anesthesia: Moderate Sedation

## 2018-02-03 MED ORDER — MIDAZOLAM HCL 5 MG/5ML IJ SOLN
INTRAMUSCULAR | Status: DC | PRN
  Administered 2018-02-03: 1 mg via INTRAVENOUS
  Administered 2018-02-03: 2 mg via INTRAVENOUS

## 2018-02-03 MED ORDER — SODIUM CHLORIDE 0.9 % IV SOLN
INTRAVENOUS | Status: DC
  Administered 2018-02-03: 14:00:00 via INTRAVENOUS

## 2018-02-03 MED ORDER — ONDANSETRON HCL 4 MG/2ML IJ SOLN
INTRAMUSCULAR | Status: AC
  Filled 2018-02-03: qty 2

## 2018-02-03 MED ORDER — MEPERIDINE HCL 100 MG/ML IJ SOLN
INTRAMUSCULAR | Status: DC | PRN
  Administered 2018-02-03 (×2): 25 mg via INTRAVENOUS

## 2018-02-03 MED ORDER — MEPERIDINE HCL 100 MG/ML IJ SOLN
INTRAMUSCULAR | Status: AC
  Filled 2018-02-03: qty 2

## 2018-02-03 MED ORDER — STERILE WATER FOR IRRIGATION IR SOLN
Status: DC | PRN
  Administered 2018-02-03: 14:00:00

## 2018-02-03 MED ORDER — MIDAZOLAM HCL 5 MG/5ML IJ SOLN
INTRAMUSCULAR | Status: AC
  Filled 2018-02-03: qty 10

## 2018-02-03 MED ORDER — ONDANSETRON HCL 4 MG/2ML IJ SOLN
INTRAMUSCULAR | Status: DC | PRN
  Administered 2018-02-03: 4 mg via INTRAVENOUS

## 2018-02-03 NOTE — Op Note (Signed)
[facility] Patient Name: Brandon Hall Procedure Date: 02/03/2018 2:17 PM MRN: [id number] Date of Birth: 07-04-1956 Attending MD: Gennette Pac , MD CSN: [id number] Age: 62 Admit Type: Outpatient Procedure:                Colonoscopy Indications:              Screening in patient at increased risk: Family                            history of 1st-degree relative with colorectal                            cancer Providers:                Gennette Pac, MD, Loma Messing B. Mathis Fare RN, RN,                            Burke Keels, Technician Referring MD:              Medicines:                Midazolam 3 mg IV, Meperidine 50 mg IV, Ondansetron                            4 mg IV Complications:            No immediate complications. Estimated Blood Loss:     Estimated blood loss: none. Procedure:                Pre-Anesthesia Assessment:                           - Prior to the procedure, a History and Physical                            was performed, and patient medications and                            allergies were reviewed. The patient's tolerance of                            previous anesthesia was also reviewed. The risks                            and benefits of the procedure and the sedation                            options and risks were discussed with the patient.                            All questions were answered, and informed consent                            was obtained. Prior Anticoagulants: The patient has  taken no previous anticoagulant or antiplatelet                            agents. ASA Grade Assessment: III - A patient with                            severe systemic disease. After reviewing the risks                            and benefits, the patient was deemed in                            satisfactory condition to undergo the procedure.                           After obtaining informed consent, the colonoscope                          was passed under direct vision. Throughout the                            procedure, the patient's blood pressure, pulse, and                            oxygen saturations were monitored continuously. The                            EC-[id number] ([id number]) scope was introduced through                            the anus and advanced to the the cecum, identified                            by appendiceal orifice and ileocecal valve. The                            colonoscopy was performed without difficulty. The                            patient tolerated the procedure well. The quality                            of the bowel preparation was adequate. The entire                            colon was well visualized. The quality of the bowel                            preparation was adequate. The ileocecal valve,                            appendiceal orifice, and rectum were photographed. Scope In: 2:31:59 PM Scope Out: 2:41:34 PM Scope Withdrawal Time: 0 hours 6 minutes 10 seconds  Total  Procedure Duration: 0 hours 9 minutes 35 seconds  Findings:      The perianal and digital rectal examinations were normal.      Scattered small and large-mouthed diverticula were found in the sigmoid       colon and descending colon.      The exam was otherwise without abnormality on direct and retroflexion       views. Impression:               - Diverticulosis in the sigmoid colon and in the                            descending colon.                           - The examination was otherwise normal on direct                            and retroflexion views.                           - No specimens collected. Moderate Sedation:      Moderate (conscious) sedation was administered by the endoscopy nurse       and supervised by the endoscopist. The following parameters were       monitored: oxygen saturation, heart rate, blood pressure, and response       to care. Total physician  intraservice time was 16 minutes. Recommendation:           - Patient has a contact number available for                            emergencies. The signs and symptoms of potential                            delayed complications were discussed with the                            patient. Return to normal activities tomorrow.                            Written discharge instructions were provided to the                            patient.                           - Resume previous diet.                           - Continue present medications.                           - Repeat colonoscopy in 5 years for screening                            purposes.                           -  Return to GI clinic PRN. Procedure Code(s):        --- Professional ---                           [phone], Colonoscopy, flexible; diagnostic, including                            collection of specimen(s) by brushing or washing,                            when performed (separate procedure)                           99152, Moderate sedation services provided by the                            same physician or other qualified health care                            professional performing the diagnostic or                            therapeutic service that the sedation supports,                            requiring the presence of an independent trained                            observer to assist in the monitoring of the                            patient's level of consciousness and physiological                            status; initial 15 minutes of intraservice time,                            patient age 61 years or older Diagnosis Code(s):        --- Professional ---                           Z80.0, Family history of malignant neoplasm of                            digestive organs                           K57.30, Diverticulosis of large intestine without                            perforation or abscess  without bleeding CPT copyright 2016 American Medical Association. All rights reserved. The codes documented in this report are preliminary and upon coder review may  be revised to meet current compliance requirements. Gerrit Friends. Mesha Schamberger, MD Gennette Pac, MD 02/03/2018 2:48:01 PM This report has been signed electronically.  Number of Addenda: 0

## 2018-02-03 NOTE — Discharge Instructions (Addendum)
Colonoscopy Discharge Instructions  Read the instructions outlined below and refer to this sheet in the next few weeks. These discharge instructions provide you with general information on caring for yourself after you leave the hospital. Your doctor may also give you specific instructions. While your treatment has been planned according to the most current medical practices available, unavoidable complications occasionally occur. If you have any problems or questions after discharge, call Dr. Gala Romney at [phone]. ACTIVITY  You may resume your regular activity, but move at a slower pace for the next 24 hours.   Take frequent rest periods for the next 24 hours.   Walking will help get rid of the air and reduce the bloated feeling in your belly (abdomen).   No driving for 24 hours (because of the medicine (anesthesia) used during the test).    Do not sign any important legal documents or operate any machinery for 24 hours (because of the anesthesia used during the test).  NUTRITION  Drink plenty of fluids.   You may resume your normal diet as instructed by your doctor.   Begin with a light meal and progress to your normal diet. Heavy or fried foods are harder to digest and may make you feel sick to your stomach (nauseated).   Avoid alcoholic beverages for 24 hours or as instructed.  MEDICATIONS  You may resume your normal medications unless your doctor tells you otherwise.  WHAT YOU CAN EXPECT TODAY  Some feelings of bloating in the abdomen.   Passage of more gas than usual.   Spotting of blood in your stool or on the toilet paper.  IF YOU HAD POLYPS REMOVED DURING THE COLONOSCOPY:  No aspirin products for 7 days or as instructed.   No alcohol for 7 days or as instructed.   Eat a soft diet for the next 24 hours.  FINDING OUT THE RESULTS OF YOUR TEST Not all test results are available during your visit. If your test results are not back during the visit, make an appointment  with your caregiver to find out the results. Do not assume everything is normal if you have not heard from your caregiver or the medical facility. It is important for you to follow up on all of your test results.  SEEK IMMEDIATE MEDICAL ATTENTION IF:  You have more than a spotting of blood in your stool.   Your belly is swollen (abdominal distention).   You are nauseated or vomiting.   You have a temperature over 101.   You have abdominal pain or discomfort that is severe or gets worse throughout the day.    Diverticulosis information provided  Repeat colonoscopy for screening purposes in 5 years.    Diverticulosis Diverticulosis is a condition that develops when small pouches (diverticula) form in the wall of the large intestine (colon). The colon is where water is absorbed and stool is formed. The pouches form when the inside layer of the colon pushes through weak spots in the outer layers of the colon. You may have a few pouches or many of them. What are the causes? The cause of this condition is not known. What increases the risk? The following factors may make you more likely to develop this condition:  Being older than age 63. Your risk for this condition increases with age. Diverticulosis is rare among people younger than age 63. By age 63, many people have it.  Eating a low-fiber diet.  Having frequent constipation.  Being overweight.  Not getting  enough exercise.  Smoking.  Taking over-the-counter pain medicines, like aspirin and ibuprofen.  Having a family history of diverticulosis.  What are the signs or symptoms? In most people, there are no symptoms of this condition. If you do have symptoms, they may include:  Bloating.  Cramps in the abdomen.  Constipation or diarrhea.  Pain in the lower left side of the abdomen.  How is this diagnosed? This condition is most often diagnosed during an exam for other colon problems. Because diverticulosis usually  has no symptoms, it often cannot be diagnosed independently. This condition may be diagnosed by:  Using a flexible scope to examine the colon (colonoscopy).  Taking an X-ray of the colon after dye has been put into the colon (barium enema).  Doing a CT scan.  How is this treated? You may not need treatment for this condition if you have never developed an infection related to diverticulosis. If you have had an infection before, treatment may include:  Eating a high-fiber diet. This may include eating more fruits, vegetables, and grains.  Taking a fiber supplement.  Taking a live bacteria supplement (probiotic).  Taking medicine to relax your colon.  Taking antibiotic medicines.  Follow these instructions at home:  Drink 6-8 glasses of water or more each day to prevent constipation.  Try not to strain when you have a bowel movement.  If you have had an infection before: ? Eat more fiber as directed by your health care provider or your diet and nutrition specialist (dietitian). ? Take a fiber supplement or probiotic, if your health care provider approves.  Take over-the-counter and prescription medicines only as told by your health care provider.  If you were prescribed an antibiotic, take it as told by your health care provider. Do not stop taking the antibiotic even if you start to feel better.  Keep all follow-up visits as told by your health care provider. This is important. Contact a health care provider if:  You have pain in your abdomen.  You have bloating.  You have cramps.  You have not had a bowel movement in 3 days. Get help right away if:  Your pain gets worse.  Your bloating becomes very bad.  You have a fever or chills, and your symptoms suddenly get worse.  You vomit.  You have bowel movements that are bloody or black.  You have bleeding from your rectum. Summary  Diverticulosis is a condition that develops when small pouches (diverticula) form  in the wall of the large intestine (colon).  You may have a few pouches or many of them.  This condition is most often diagnosed during an exam for other colon problems.  If you have had an infection related to diverticulosis, treatment may include increasing the fiber in your diet, taking supplements, or taking medicines. This information is not intended to replace advice given to you by your health care provider. Make sure you discuss any questions you have with your health care provider. Document Released: 09/11/2004 Document Revised: 11/03/2016 Document Reviewed: 11/03/2016 Elsevier Interactive Patient Education  2017 [employer].

## 2018-02-03 NOTE — H&P (Signed)
@LOGO @   Primary Care Physician:  Dettinger, Fransisca Kaufmann, MD Primary Gastroenterologist:  Dr. Gala Romney  Pre-Procedure History & Physical: HPI:  Brandon Hall is a 62 y.o. male is here for a screening colonoscopy. Negative colonoscopy 2012. Positive family history of colon cancer in his brother.  Past Medical History:  Diagnosis Date  . Arthritis   . Blindness of right eye   . Cancer ([place])    pharyngeal ca  . Cataract, secondary obscuring vision    LEFT  . Graves disease   . History of shingles    RASH  02-03-2014  PER PCP RESOLVING  . Hypertension    NO MEDS ,  MONITORED BY PCP  . Hypothyroidism, postradioiodine therapy   . Prostate cancer ([place])    DX  SEVERAL  YRS   AGO  WITH RADIATION THERAPY  . Squamous cell carcinoma of head and neck ([place]) 07/12/2014  . 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 COLONOSCOPY  10/20/2011   Procedure: COLONOSCOPY;  Surgeon: Daneil Dolin, MD;  Location: [facility];  Service: Endoscopy;  Laterality: N/A;  1:00 pm  . CRYOABLATION N/A 04/07/2014   Procedure: SALVAGE CRYO ABLATION PROSTATE;  Surgeon: Lowella Bandy, MD;  Location: [facility];  Service: Urology;  Laterality: N/A;  . IR GENERIC HISTORICAL  03/27/2017   IR REMOVAL TUN ACCESS W/ PORT W/O FL MOD SED [facility]-INTERV RAD  . LARYNGOSCOPY    . LYMPH NODE BIOPSY      Prior to Admission medications   Medication Sig Start Date End Date Taking? Authorizing Provider  hydrochlorothiazide (HYDRODIURIL) 12.5 MG tablet Take 1 tablet (12.5 mg total) by mouth daily. 01/16/17  Yes [name], MD  levothyroxine (SYNTHROID, LEVOTHROID) 100 MCG tablet Take 1 tablet (100 mcg total) by mouth daily. 01/16/17  Yes [name], MD  polyethylene glycol-electrolytes (TRILYTE) 420 g solution Take 4,000 mLs by mouth as directed. 01/13/18  Yes Mahala Menghini, PA-C  pravastatin (PRAVACHOL) 40 MG tablet TAKE 1 TABLET (40 MG TOTAL) BY  MOUTH DAILY. 01/15/18  Yes Dettinger, Fransisca Kaufmann, MD  Vitamin D, Ergocalciferol, (DRISDOL) 50000 units CAPS capsule TAKE 1 CAPSULE EVERY 7 DAYS. 08/26/17  Yes Dettinger, Fransisca Kaufmann, MD  potassium chloride SA (K-DUR,KLOR-CON) 20 MEQ tablet Take 1 tablet (20 mEq total) by mouth 2 (two) times daily. Patient not taking: Reported on 01/21/2018 09/17/17   Holley Bouche, NP    Allergies as of 01/13/2018  . (No Known Allergies)    Family History  Problem Relation Age of Onset  . Colon cancer Father   . Hypertension Mother   . Colon cancer Brother   . Colon cancer Brother     Social History   Socioeconomic History  . Marital status: Married    Spouse name: Not on file  . Number of children: Not on file  . Years of education: Not on file  . Highest education level: Not on file  Social Needs  . Financial resource strain: Not very hard  . Food insecurity - worry: Never true  . Food insecurity - inability: Never true  . Transportation needs - medical: No  . Transportation needs - non-medical: No  Occupational History  . Occupation: disabled  Tobacco Use  . Smoking status: Former Smoker    Packs/day: 1.00    Years: 40.00    Pack years: 40.00    Types: Cigarettes  Last attempt to quit: 09/29/2013    Years since quitting: 4.3  . Smokeless tobacco: Never Used  Substance and Sexual Activity  . Alcohol use: No  . Drug use: No  . Sexual activity: Not on file  Other Topics Concern  . Not on file  Social History Narrative   Lives with wife in an apartment. He has stepchildren. They walk most everywhere they go locally but have access to transportation if needed.    Review of Systems: See HPI, otherwise negative ROS  Physical Exam: BP (!) 150/80   Pulse 64   Temp 97.9 F (36.6 C) (Oral)   Resp 15   Ht 6' (1.829 m)   Wt 138 lb (62.6 kg)   SpO2 100%   BMI 18.72 kg/m  General:   Alert,  Well-developed, well-nourished, pleasant and cooperative in NAD Lungs:  Clear throughout to  auscultation.   No wheezes, crackles, or rhonchi. No acute distress. Heart:  Regular rate and rhythm; no murmurs, clicks, rubs,  or gallops. Abdomen:  Soft, nontender and nondistended. No masses, hepatosplenomegaly or hernias noted. Normal bowel sounds, without guarding, and without rebound.    Impression/Plan: [name] is now here to undergo a screening colonoscopy.  High risk screening examination.  Risks, benefits, limitations, imponderables and alternatives regarding colonoscopy have been reviewed with the patient. Questions have been answered. All parties agreeable.     Notice:  This dictation was prepared with Dragon dictation along with smaller phrase technology. Any transcriptional errors that result from this process are unintentional and may not be corrected upon review.

## 2018-02-05 ENCOUNTER — Encounter (HOSPITAL_COMMUNITY): Payer: Self-pay | Admitting: Internal Medicine

## 2018-03-05 ENCOUNTER — Encounter: Payer: Self-pay | Admitting: Radiation Oncology

## 2018-03-05 NOTE — Progress Notes (Signed)
Brandon Hall presents for follow up of radiation completed 12/25/14 to his Left Neck.   Pain issues, if any: He denies Using a feeding tube?: No Weight changes, if any:  Wt Readings from Last 3 Encounters:  03/12/18 140 lb 6.4 oz (63.7 kg)  02/03/18 138 lb (62.6 kg)  12/11/17 138 lb (62.6 kg)   Swallowing issues, if any: He denies.  Smoking or chewing tobacco? He denies.  Using fluoride trays daily? No Last ENT visit was on: Dr. Wilburn Cornelia 09/29/17 Other notable issues, if any:  His family reports voice changes since last week. His voice "will come in and out". He denies feeling sick.   BP 136/74   Pulse 71   Temp 97.8 F (36.6 C)   Ht 6' (1.829 m)   Wt 140 lb 6.4 oz (63.7 kg)   SpO2 99% Comment: room air  BMI 19.04 kg/m

## 2018-03-09 ENCOUNTER — Other Ambulatory Visit (HOSPITAL_COMMUNITY): Payer: Self-pay

## 2018-03-09 DIAGNOSIS — Z8546 Personal history of malignant neoplasm of prostate: Secondary | ICD-10-CM

## 2018-03-09 DIAGNOSIS — C760 Malignant neoplasm of head, face and neck: Secondary | ICD-10-CM

## 2018-03-10 ENCOUNTER — Inpatient Hospital Stay (HOSPITAL_COMMUNITY): Payer: Medicare HMO | Attending: Internal Medicine

## 2018-03-10 DIAGNOSIS — E876 Hypokalemia: Secondary | ICD-10-CM

## 2018-03-10 DIAGNOSIS — Z8589 Personal history of malignant neoplasm of other organs and systems: Secondary | ICD-10-CM | POA: Insufficient documentation

## 2018-03-10 DIAGNOSIS — Z8546 Personal history of malignant neoplasm of prostate: Secondary | ICD-10-CM

## 2018-03-10 DIAGNOSIS — C760 Malignant neoplasm of head, face and neck: Secondary | ICD-10-CM

## 2018-03-10 LAB — COMPREHENSIVE METABOLIC PANEL
ALBUMIN: 4.1 g/dL (ref 3.5–5.0)
ALT: 17 U/L (ref 17–63)
AST: 21 U/L (ref 15–41)
Alkaline Phosphatase: 61 U/L (ref 38–126)
Anion gap: 8 (ref 5–15)
BILIRUBIN TOTAL: 0.6 mg/dL (ref 0.3–1.2)
BUN: 18 mg/dL (ref 6–20)
CO2: 27 mmol/L (ref 22–32)
CREATININE: 0.96 mg/dL (ref 0.61–1.24)
Calcium: 9.5 mg/dL (ref 8.9–10.3)
Chloride: 101 mmol/L (ref 101–111)
GFR calc Af Amer: >60 mL/min (ref >60)
GLUCOSE: 118 mg/dL — AB (ref 65–99)
Potassium: 3.7 mmol/L (ref 3.5–5.1)
Sodium: 136 mmol/L (ref 135–145)
TOTAL PROTEIN: 7.1 g/dL (ref 6.5–8.1)

## 2018-03-10 LAB — CBC WITH DIFFERENTIAL/PLATELET
BASOS ABS: 0 10*3/uL (ref 0.0–0.1)
BASOS PCT: 0 %
EOS PCT: 1 %
Eosinophils Absolute: 0 10*3/uL (ref 0.0–0.7)
HEMATOCRIT: 46.8 % (ref 39.0–52.0)
Hemoglobin: 14.5 g/dL (ref 13.0–17.0)
Lymphocytes Relative: 35 %
Lymphs Abs: 1.4 10*3/uL (ref 0.7–4.0)
MCH: 26.6 pg (ref 26.0–34.0)
MCHC: 31 g/dL (ref 30.0–36.0)
MCV: 85.7 fL (ref 78.0–100.0)
MONO ABS: 0.3 10*3/uL (ref 0.1–1.0)
MONOS PCT: 7 %
NEUTROS ABS: 2.3 10*3/uL (ref 1.7–7.7)
Neutrophils Relative %: 57 %
PLATELETS: 161 10*3/uL (ref 150–400)
RBC: 5.46 MIL/uL (ref 4.22–5.81)
RDW: 15.4 % (ref 11.5–15.5)
WBC: 4 10*3/uL (ref 4.0–10.5)

## 2018-03-10 LAB — TSH: TSH: 1.112 u[IU]/mL (ref 0.350–4.500)

## 2018-03-12 ENCOUNTER — Other Ambulatory Visit: Payer: Self-pay

## 2018-03-12 ENCOUNTER — Encounter: Payer: Self-pay | Admitting: Radiation Oncology

## 2018-03-12 ENCOUNTER — Ambulatory Visit
Admission: RE | Admit: 2018-03-12 | Discharge: 2018-03-12 | Disposition: A | Discharge status: Home / Self Care | Payer: Medicare HMO | Source: Ambulatory Visit | Attending: Radiation Oncology | Admitting: Radiation Oncology

## 2018-03-12 VITALS — BP 136/74 | HR 71 | Temp 97.8°F | Ht 72.0 in | Wt 140.4 lb

## 2018-03-12 DIAGNOSIS — Z79890 Hormone replacement therapy (postmenopausal): Secondary | ICD-10-CM | POA: Insufficient documentation

## 2018-03-12 DIAGNOSIS — Z79899 Other long term (current) drug therapy: Secondary | ICD-10-CM | POA: Diagnosis not present

## 2018-03-12 DIAGNOSIS — Z08 Encounter for follow-up examination after completed treatment for malignant neoplasm: Secondary | ICD-10-CM | POA: Diagnosis not present

## 2018-03-12 DIAGNOSIS — Z923 Personal history of irradiation: Secondary | ICD-10-CM | POA: Diagnosis not present

## 2018-03-12 DIAGNOSIS — Z8521 Personal history of malignant neoplasm of larynx: Secondary | ICD-10-CM | POA: Diagnosis not present

## 2018-03-12 DIAGNOSIS — R490 Dysphonia: Secondary | ICD-10-CM | POA: Insufficient documentation

## 2018-03-12 DIAGNOSIS — C760 Malignant neoplasm of head, face and neck: Secondary | ICD-10-CM | POA: Insufficient documentation

## 2018-03-12 DIAGNOSIS — R498 Other voice and resonance disorders: Secondary | ICD-10-CM | POA: Diagnosis not present

## 2018-03-12 HISTORY — DX: Personal history of irradiation: Z92.3

## 2018-03-12 NOTE — Progress Notes (Addendum)
Radiation Oncology         [phone] ________________________________  Name: Brandon Hall MRN: [id number]  Date: 03/12/2018  DOB: December 15, 1956  Follow-Up Visit Note  Outpatient  CC: Dettinger, Elige Radon, MD  Osborn Coho, MD  Diagnosis and Prior Radiotherapy:    ICD-10-CM   1. 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  Narrative:  The patient returns today for routine follow-up of radiation completed 12/25/2014 to his left neck.  Pain issues, if any: He denies.  Using a feeding tube?: No  Weight changes, if any: Wt Readings from Last 3 Encounters:  03/12/18 140 lb 6.4 oz (63.7 kg)  02/03/18 138 lb (62.6 kg)  12/11/17 138 lb (62.6 kg)   Swallowing issues, if any: He denies.   Smoking or chewing tobacco? He denies.   Using fluoride trays daily? No - edentulous  Last ENT visit was on: 09/29/2017 with Dr. Annalee Genta  Other notable issues, if any: His family reports voice changes and hoarseness the past 5 days. His voice "will come in and out". This has happened before with resolution. He denies feeling sick.    ALLERGIES:  has No Known Allergies.  Meds: Current Outpatient Medications  Medication Sig Dispense Refill  . hydrochlorothiazide (HYDRODIURIL) 12.5 MG tablet Take 1 tablet (12.5 mg total) by mouth daily. 90 tablet 3  . levothyroxine (SYNTHROID, LEVOTHROID) 100 MCG tablet Take 1 tablet (100 mcg total) by mouth daily. 90 tablet 3  . pravastatin (PRAVACHOL) 40 MG tablet TAKE 1 TABLET (40 MG TOTAL) BY MOUTH DAILY. 90 tablet 1  . Vitamin D, Ergocalciferol, (DRISDOL) 50000 units CAPS capsule TAKE 1 CAPSULE EVERY 7 DAYS. 12 capsule 0  . potassium chloride SA (K-DUR,KLOR-CON) 20 MEQ tablet Take 1 tablet (20 mEq total)  by mouth 2 (two) times daily. (Patient not taking: Reported on 01/21/2018) 30 tablet 0   No current facility-administered medications for this encounter.     Physical Findings:  height is 6' (1.829 m) and weight is 140 lb 6.4 oz (63.7 kg). His temperature is 97.8 F (36.6 C). His blood pressure is 136/74 and his pulse is 71. His oxygen saturation is 99%.   General: Alert and oriented, in no acute distress. HEENT: Head is normocephalic. Oral cavity and oropharynx are clear. Neck: He still has a submental cyst which is not of clinical concern. Heart: Regular in rate and rhythm with no murmurs, rubs, or gallops. Chest: Clear to auscultation bilaterally, with no rhonchi, wheezes, or rales.  Laryngoscopy offered for voice changes but patient declined  Lab Findings: Lab Results  Component Value Date   WBC 4.0 03/10/2018   HGB 14.5 03/10/2018   HCT 46.8 03/10/2018   MCV 85.7 03/10/2018   PLT 161 03/10/2018   Lab Results  Component Value Date   TSH 1.112 03/10/2018    Radiographic Findings: No results found.  Impression/Plan:  62 y.o. gentleman with TxN3M0 squamous cell carcinoma of L neck, Stave IVB, unknown primary. NED. He is doing well. If his hoarseness persists over the next 2-3 weeks, he knows to see Dr. Annalee Genta. Thyroid function is WNL on supplements. Continue this per PCP.   I asked the patient today about tobacco use. The patient is abstaining.   Follow up  with me in 1 year.  ________________________________   Lonie Peak, MD  This document serves as a record of services personally performed by Lonie Peak, MD. It was created on her behalf by Ivar Bury, a trained medical scribe. The creation of this record is based on the scribe's personal observations and the provider's statements to them. This document has been checked and approved by the attending provider.

## 2018-03-15 ENCOUNTER — Encounter: Payer: Self-pay | Admitting: Radiation Oncology

## 2018-03-17 ENCOUNTER — Encounter (HOSPITAL_COMMUNITY): Payer: Self-pay | Admitting: Adult Health

## 2018-03-17 ENCOUNTER — Ambulatory Visit (HOSPITAL_COMMUNITY): Payer: Medicare HMO | Admitting: Adult Health

## 2018-03-17 NOTE — Progress Notes (Signed)
Patient initially scheduled to be seen today at our cancer center.  His chart was reviewed; he recently saw Dr. Isidore Moos with radiation oncology on 03/12/18 with documentation that he remains without evidence of disease.  Given his recent visit with radiation oncology, I will move out his appointment with [place] 2 to 3-4 months from now, which will allow him to meet with her medical oncologist Dr. Delton Coombes at that time.  Her cancer center schedulers will contact patient today regarding this change in his appointment schedule.  Next  As always, he is welcome to call us if he has any concerning symptoms, and we are happy to see him sooner if needed.  Mike Craze, NP [place] [phone]

## 2018-03-23 DIAGNOSIS — C61 Malignant neoplasm of prostate: Secondary | ICD-10-CM | POA: Diagnosis not present

## 2018-03-30 ENCOUNTER — Ambulatory Visit: Payer: Medicare HMO | Admitting: Urology

## 2018-03-30 DIAGNOSIS — R9721 Rising PSA following treatment for malignant neoplasm of prostate: Secondary | ICD-10-CM

## 2018-03-30 DIAGNOSIS — C61 Malignant neoplasm of prostate: Secondary | ICD-10-CM

## 2018-04-02 ENCOUNTER — Other Ambulatory Visit: Payer: Self-pay | Admitting: Urology

## 2018-04-02 DIAGNOSIS — Z8589 Personal history of malignant neoplasm of other organs and systems: Secondary | ICD-10-CM | POA: Diagnosis not present

## 2018-04-02 DIAGNOSIS — C61 Malignant neoplasm of prostate: Secondary | ICD-10-CM

## 2018-04-02 DIAGNOSIS — R490 Dysphonia: Secondary | ICD-10-CM | POA: Diagnosis not present

## 2018-04-02 DIAGNOSIS — Z87891 Personal history of nicotine dependence: Secondary | ICD-10-CM | POA: Diagnosis not present

## 2018-04-02 DIAGNOSIS — R9721 Rising PSA following treatment for malignant neoplasm of prostate: Secondary | ICD-10-CM

## 2018-04-21 ENCOUNTER — Ambulatory Visit (HOSPITAL_COMMUNITY)
Admission: RE | Admit: 2018-04-21 | Discharge: 2018-04-21 | Disposition: A | Discharge status: Home / Self Care | Payer: Medicare HMO | Source: Ambulatory Visit | Attending: Urology | Admitting: Urology

## 2018-04-21 DIAGNOSIS — C61 Malignant neoplasm of prostate: Secondary | ICD-10-CM | POA: Diagnosis not present

## 2018-04-21 DIAGNOSIS — N2889 Other specified disorders of kidney and ureter: Secondary | ICD-10-CM | POA: Insufficient documentation

## 2018-04-21 DIAGNOSIS — N281 Cyst of kidney, acquired: Secondary | ICD-10-CM | POA: Insufficient documentation

## 2018-04-21 DIAGNOSIS — I700 Atherosclerosis of aorta: Secondary | ICD-10-CM | POA: Diagnosis not present

## 2018-04-21 DIAGNOSIS — R972 Elevated prostate specific antigen [PSA]: Secondary | ICD-10-CM | POA: Diagnosis not present

## 2018-04-21 DIAGNOSIS — R9721 Rising PSA following treatment for malignant neoplasm of prostate: Secondary | ICD-10-CM | POA: Insufficient documentation

## 2018-04-21 LAB — POCT I-STAT CREATININE: CREATININE: 1 mg/dL (ref 0.61–1.24)

## 2018-04-21 MED ORDER — IOPAMIDOL (ISOVUE-300) INJECTION 61%
100.0000 mL | Freq: Once | INTRAVENOUS | Status: AC | PRN
  Administered 2018-04-21: 100 mL via INTRAVENOUS

## 2018-04-23 ENCOUNTER — Ambulatory Visit: Payer: Medicare HMO | Admitting: Family Medicine

## 2018-04-27 ENCOUNTER — Encounter: Payer: Self-pay | Admitting: Family Medicine

## 2018-04-27 ENCOUNTER — Ambulatory Visit (INDEPENDENT_AMBULATORY_CARE_PROVIDER_SITE_OTHER): Payer: Medicare HMO | Admitting: Family Medicine

## 2018-04-27 VITALS — BP 137/81 | HR 67 | Temp 97.0°F | Ht 72.0 in | Wt 137.0 lb

## 2018-04-27 DIAGNOSIS — E785 Hyperlipidemia, unspecified: Secondary | ICD-10-CM | POA: Diagnosis not present

## 2018-04-27 DIAGNOSIS — I10 Essential (primary) hypertension: Secondary | ICD-10-CM | POA: Diagnosis not present

## 2018-04-27 DIAGNOSIS — E039 Hypothyroidism, unspecified: Secondary | ICD-10-CM | POA: Diagnosis not present

## 2018-04-27 MED ORDER — HYDROCHLOROTHIAZIDE 12.5 MG PO TABS: 12.5000 mg | ORAL_TABLET | Freq: Every day | ORAL | 1 refills | Status: DC

## 2018-04-27 MED ORDER — PRAVASTATIN SODIUM 40 MG PO TABS: 40.0000 mg | ORAL_TABLET | Freq: Every day | ORAL | 1 refills | Status: DC

## 2018-04-27 MED ORDER — LEVOTHYROXINE SODIUM 100 MCG PO TABS: 100.0000 ug | ORAL_TABLET | Freq: Every day | ORAL | 1 refills | Status: DC

## 2018-04-27 MED ORDER — VITAMIN D (ERGOCALCIFEROL) 1.25 MG (50000 UNIT) PO CAPS: ORAL_CAPSULE | ORAL | 1 refills | Status: DC

## 2018-04-27 NOTE — Progress Notes (Signed)
BP 137/81   Pulse 67   Temp (!) 97 F (36.1 C) (Oral)   Ht 6' (1.829 m)   Wt 137 lb (62.1 kg)   BMI 18.58 kg/m    Subjective:    Patient ID: Brandon Hall, male    DOB: 1956/09/06, 62 y.o.   MRN: [id number]  HPI: Brandon Hall is a 62 y.o. male presenting on 04/27/2018 for Hypothyroidism; Hypertension; and Hyperlipidemia   HPI Hypertension Patient is currently on hydrochlorothiazide, and their blood pressure today is 137/81. Patient denies any lightheadedness or dizziness. Patient denies headaches, blurred vision, chest pains, shortness of breath, or weakness. Denies any side effects from medication and is content with current medication.   Hypothyroidism recheck Patient is coming in for thyroid recheck today as well. They deny any issues with hair changes or heat or cold problems or diarrhea or constipation. They deny any chest pain or palpitations. They are currently on levothyroxine 100 micrograms   Hyperlipidemia Patient is coming in for recheck of his hyperlipidemia. The patient is currently taking pravastatin. They deny any issues with myalgias or history of liver damage from it. They deny any focal numbness or weakness or chest pain.   Relevant past medical, surgical, family and social history reviewed and updated as indicated. Interim medical history since our last visit reviewed. Allergies and medications reviewed and updated.  Review of Systems  Constitutional: Negative for chills and fever.  Respiratory: Negative for shortness of breath and wheezing.   Cardiovascular: Negative for chest pain and leg swelling.  Musculoskeletal: Negative for back pain and gait problem.  Skin: Negative for rash.  Neurological: Negative for dizziness, weakness, light-headedness and numbness.  All other systems reviewed and are negative.   Per HPI unless specifically indicated above   Allergies as of 04/27/2018   No Known Allergies     Medication List        Accurate as of 04/27/18  11:01 AM. Always use your most recent med list.          hydrochlorothiazide 12.5 MG tablet Commonly known as:  HYDRODIURIL Take 1 tablet (12.5 mg total) by mouth daily.   levothyroxine 100 MCG tablet Commonly known as:  SYNTHROID, LEVOTHROID Take 1 tablet (100 mcg total) by mouth daily.   pravastatin 40 MG tablet Commonly known as:  PRAVACHOL Take 1 tablet (40 mg total) by mouth daily.   Vitamin D (Ergocalciferol) 50000 units Caps capsule Commonly known as:  DRISDOL TAKE 1 CAPSULE EVERY 7 DAYS.          Objective:    BP 137/81   Pulse 67   Temp (!) 97 F (36.1 C) (Oral)   Ht 6' (1.829 m)   Wt 137 lb (62.1 kg)   BMI 18.58 kg/m   Wt Readings from Last 3 Encounters:  04/27/18 137 lb (62.1 kg)  03/12/18 140 lb 6.4 oz (63.7 kg)  02/03/18 138 lb (62.6 kg)    Physical Exam  Constitutional: He is oriented to person, place, and time. He appears well-developed and well-nourished. No distress.  Eyes: Conjunctivae are normal. No scleral icterus.  Neck: Neck supple. No thyromegaly present.  Cardiovascular: Normal rate, regular rhythm, normal heart sounds and intact distal pulses.  No murmur heard. Pulmonary/Chest: Effort normal and breath sounds normal. No respiratory distress. He has no wheezes.  Musculoskeletal: Normal range of motion. He exhibits no edema.  Lymphadenopathy:    He has no cervical adenopathy.  Neurological: He is alert and oriented  to person, place, and time. Coordination normal.  Skin: Skin is warm and dry. No rash noted. He is not diaphoretic.  Psychiatric: He has a normal mood and affect. His behavior is normal.  Nursing note and vitals reviewed.       Assessment & Plan:   Problem List Items Addressed This Visit      Cardiovascular and Mediastinum   Hypertension - Primary   Relevant Medications   pravastatin (PRAVACHOL) 40 MG tablet   hydrochlorothiazide (HYDRODIURIL) 12.5 MG tablet     Endocrine   Hypothyroid   Relevant Medications    levothyroxine (SYNTHROID, LEVOTHROID) 100 MCG tablet     Other   Dyslipidemia   Relevant Medications   pravastatin (PRAVACHOL) 40 MG tablet   Other Relevant Orders   Lipid panel (Completed)      Will check labs and continue current medications, patient is currently on hydrochlorothiazide 12.5 and Synthroid 100 and pravastatin 40  Follow up plan: Return in about 6 months (around 10/27/2018), or if symptoms worsen or fail to improve, for Hypertension and cholesterol recheck.  Counseling provided for all of the vaccine components No orders of the defined types were placed in this encounter.   Arville Care, MD [facility] 04/27/2018, 11:01 AM

## 2018-04-27 NOTE — Patient Instructions (Signed)
We will check labs today, continue current medications, congratulations on cancer free scans

## 2018-04-28 LAB — LIPID PANEL
CHOL/HDL RATIO: 2.6 ratio (ref 0.0–5.0)
CHOLESTEROL TOTAL: 153 mg/dL (ref 100–199)
HDL: 58 mg/dL (ref >39)
LDL CALC: 87 mg/dL (ref 0–99)
TRIGLYCERIDES: 42 mg/dL (ref 0–149)
VLDL Cholesterol Cal: 8 mg/dL (ref 5–40)

## 2018-05-12 ENCOUNTER — Ambulatory Visit (INDEPENDENT_AMBULATORY_CARE_PROVIDER_SITE_OTHER): Payer: Medicare HMO | Admitting: Family Medicine

## 2018-05-12 ENCOUNTER — Encounter: Payer: Self-pay | Admitting: Family Medicine

## 2018-05-12 VITALS — BP 137/74 | HR 82 | Temp 99.1°F | Ht 72.0 in | Wt 135.0 lb

## 2018-05-12 DIAGNOSIS — N3001 Acute cystitis with hematuria: Secondary | ICD-10-CM

## 2018-05-12 DIAGNOSIS — R319 Hematuria, unspecified: Secondary | ICD-10-CM | POA: Diagnosis not present

## 2018-05-12 LAB — URINALYSIS, COMPLETE
BILIRUBIN UA: NEGATIVE
GLUCOSE, UA: NEGATIVE
Nitrite, UA: POSITIVE — AB
PH UA: 5.5 (ref 5.0–7.5)
SPEC GRAV UA: 1.025 (ref 1.005–1.030)
UUROB: 0.2 mg/dL (ref 0.2–1.0)

## 2018-05-12 LAB — MICROSCOPIC EXAMINATION
RBC, UA: >30 /hpf — AB (ref 0–2)
RENAL EPITHEL UA: NONE SEEN /HPF
WBC, UA: >30 /hpf — AB (ref 0–5)

## 2018-05-12 MED ORDER — CEPHALEXIN 500 MG PO CAPS: 500.0000 mg | ORAL_CAPSULE | Freq: Four times a day (QID) | ORAL | 0 refills | Status: DC

## 2018-05-12 NOTE — Progress Notes (Signed)
BP 137/74   Pulse 82   Temp 99.1 F (37.3 C) (Oral)   Ht 6' (1.829 m)   Wt 135 lb (61.2 kg)   BMI 18.31 kg/m    Subjective:    Patient ID: RAMBO FAZEKAS, male    DOB: May 10, 1956, 62 y.o.   MRN: [id number]  HPI: Brandon Hall is a 62 y.o. male presenting on 05/12/2018 for Hematuria, abdominal pain (x 4 days) and Constipation (using a laxative, polyethylene glycol )   HPI Hematuria and dysuria Patient has been having hematuria and dysuria and lower abdominal pain that is been going on for the past 4 days.  He denies any fevers or chills or flank pain.  He denies any pain going anywhere else besides his lower abdomen and says that the pain is mild he has been trying to hydrate but he has still been having 3-4 episodes of hematuria over the past 4 days.  For constipation he is only been on 1 day of MiraLAX and recommend continue it and let us know if he needs anything further next week.  Relevant past medical, surgical, family and social history reviewed and updated as indicated. Interim medical history since our last visit reviewed. Allergies and medications reviewed and updated.  Review of Systems  Constitutional: Negative for chills and fever.  Respiratory: Negative for shortness of breath and wheezing.   Cardiovascular: Negative for chest pain and leg swelling.  Gastrointestinal: Positive for abdominal pain and constipation. Negative for diarrhea, nausea and vomiting.  Genitourinary: Positive for dysuria, frequency, hematuria and urgency. Negative for decreased urine volume.  Musculoskeletal: Negative for back pain and gait problem.  Skin: Negative for rash.  All other systems reviewed and are negative.   Per HPI unless specifically indicated above   Allergies as of 05/12/2018   No Known Allergies     Medication List        Accurate as of 05/12/18  2:05 PM. Always use your most recent med list.          cephALEXin 500 MG capsule Commonly known as:  KEFLEX Take 1  capsule (500 mg total) by mouth 4 (four) times daily.   hydrochlorothiazide 12.5 MG tablet Commonly known as:  HYDRODIURIL Take 1 tablet (12.5 mg total) by mouth daily.   levothyroxine 100 MCG tablet Commonly known as:  SYNTHROID, LEVOTHROID Take 1 tablet (100 mcg total) by mouth daily.   pravastatin 40 MG tablet Commonly known as:  PRAVACHOL Take 1 tablet (40 mg total) by mouth daily.   Vitamin D (Ergocalciferol) 50000 units Caps capsule Commonly known as:  DRISDOL TAKE 1 CAPSULE EVERY 7 DAYS.          Objective:    BP 137/74   Pulse 82   Temp 99.1 F (37.3 C) (Oral)   Ht 6' (1.829 m)   Wt 135 lb (61.2 kg)   BMI 18.31 kg/m   Wt Readings from Last 3 Encounters:  05/12/18 135 lb (61.2 kg)  04/27/18 137 lb (62.1 kg)  03/12/18 140 lb 6.4 oz (63.7 kg)    Physical Exam  Constitutional: He is oriented to person, place, and time. He appears well-developed and well-nourished. No distress.  Eyes: Conjunctivae are normal. No scleral icterus.  Neck: Neck supple. No thyromegaly present.  Cardiovascular: Normal rate, regular rhythm, normal heart sounds and intact distal pulses.  No murmur heard. Pulmonary/Chest: Effort normal and breath sounds normal. No respiratory distress. He has no wheezes.  Abdominal: Soft. Bowel  sounds are normal. He exhibits no distension. There is tenderness (Suprapubic tenderness, no CVA tenderness). There is no rebound and no guarding.  Musculoskeletal: Normal range of motion. He exhibits no edema.  Lymphadenopathy:    He has no cervical adenopathy.  Neurological: He is alert and oriented to person, place, and time. Coordination normal.  Skin: Skin is warm and dry. No rash noted. He is not diaphoretic.  Psychiatric: He has a normal mood and affect. His behavior is normal.  Nursing note and vitals reviewed.   Urinalysis: Greater than 30 WBCs, greater than 30 RBCs, 0-10 epithelial cells, many bacteria, nitrite positive, 3+ blood, 1+ ketones and 1+  leukocytes    Assessment & Plan:   Problem List Items Addressed This Visit    None    Visit Diagnoses    Acute cystitis with hematuria    -  Primary   Relevant Medications   cephALEXin (KEFLEX) 500 MG capsule   Other Relevant Orders   Urinalysis, Complete      Will treat with Keflex and recheck urine at next office visit.  Follow up plan: Return if symptoms worsen or fail to improve.  Counseling provided for all of the vaccine components Orders Placed This Encounter  Procedures  . Urinalysis, Complete    Arville Care, MD [facility] 05/12/2018, 2:05 PM

## 2018-05-19 ENCOUNTER — Telehealth (HOSPITAL_COMMUNITY): Payer: Self-pay

## 2018-05-19 NOTE — Telephone Encounter (Signed)
Patients friend, Brayton Layman, called concerned because patient has been hoarse for several months and it is getting worse. She states they called the ENT and were told there is nothing wrong. Reviewed with RN. Appointment moved to next week with Dr. Walden Field.

## 2018-05-25 ENCOUNTER — Encounter: Payer: Self-pay | Admitting: Internal Medicine

## 2018-05-25 ENCOUNTER — Inpatient Hospital Stay (HOSPITAL_COMMUNITY): Payer: Medicare HMO | Attending: Hematology | Admitting: Internal Medicine

## 2018-05-25 ENCOUNTER — Other Ambulatory Visit: Payer: Self-pay

## 2018-05-25 VITALS — BP 164/82 | HR 70 | Temp 97.7°F | Resp 18 | Ht 66.0 in | Wt 135.5 lb

## 2018-05-25 DIAGNOSIS — I10 Essential (primary) hypertension: Secondary | ICD-10-CM | POA: Diagnosis not present

## 2018-05-25 DIAGNOSIS — Z8589 Personal history of malignant neoplasm of other organs and systems: Secondary | ICD-10-CM

## 2018-05-25 DIAGNOSIS — R490 Dysphonia: Secondary | ICD-10-CM | POA: Insufficient documentation

## 2018-05-25 DIAGNOSIS — Z8546 Personal history of malignant neoplasm of prostate: Secondary | ICD-10-CM | POA: Diagnosis not present

## 2018-05-25 DIAGNOSIS — Z923 Personal history of irradiation: Secondary | ICD-10-CM | POA: Diagnosis not present

## 2018-05-25 DIAGNOSIS — E0500 Thyrotoxicosis with diffuse goiter without thyrotoxic crisis or storm: Secondary | ICD-10-CM | POA: Diagnosis not present

## 2018-05-25 DIAGNOSIS — Z9221 Personal history of antineoplastic chemotherapy: Secondary | ICD-10-CM | POA: Insufficient documentation

## 2018-05-25 DIAGNOSIS — Z800 Family history of malignant neoplasm of digestive organs: Secondary | ICD-10-CM | POA: Insufficient documentation

## 2018-05-25 DIAGNOSIS — Z87891 Personal history of nicotine dependence: Secondary | ICD-10-CM | POA: Diagnosis not present

## 2018-05-25 DIAGNOSIS — N281 Cyst of kidney, acquired: Secondary | ICD-10-CM | POA: Diagnosis not present

## 2018-05-25 DIAGNOSIS — E039 Hypothyroidism, unspecified: Secondary | ICD-10-CM | POA: Insufficient documentation

## 2018-05-25 DIAGNOSIS — M129 Arthropathy, unspecified: Secondary | ICD-10-CM | POA: Diagnosis not present

## 2018-05-25 DIAGNOSIS — Z79899 Other long term (current) drug therapy: Secondary | ICD-10-CM | POA: Diagnosis not present

## 2018-05-25 DIAGNOSIS — E559 Vitamin D deficiency, unspecified: Secondary | ICD-10-CM | POA: Insufficient documentation

## 2018-05-25 DIAGNOSIS — C760 Malignant neoplasm of head, face and neck: Secondary | ICD-10-CM

## 2018-05-25 NOTE — Progress Notes (Signed)
Diagnosis Squamous cell carcinoma of head and neck (HCC) - Plan: CT SOFT TISSUE NECK W CONTRAST, CT CHEST W CONTRAST, CBC with Differential/Platelet, Comprehensive metabolic panel, Lactate dehydrogenase, TSH  Staging Cancer Staging Squamous cell carcinoma of head and neck (HCC) Staging form: Pharynx - Nasopharynx, AJCC 7th Edition - Clinical: Stage IVB (TX, N3a, M0) - Signed by Eppie Gibson, MD on 07/13/2014  ASSESSMENT & PLAN: 1.  Stage IVB squamous cell carcinoma with (L) neck mets/unknown primary (likely H&N origin); p16-. Pt was diagnosed in 04/2014. Treated with definitive radiation, completing treatment on 12/25/14.  He was recently seen by Dr. Isidore Moos of [facility] 03/12/18 with documentation that he remains without evidence of disease.  Pt had CT abdomen and pelvis done 03/2018 that showed kidney cyst.  He has also been seen by  ENT physician, Dr. Wilburn Cornelia.   Pt complains of persistent hoarseness.  He will be set up for CT of neck and chest and will follow-up in 2 weeks to go over results.  He will also be referred to GI for evaluation for EGD.  If no findings on CT or EGD will refer back to Dr. Wilburn Cornelia.    2.  Persistent hoarseness. Pt is  set up for CT of neck and chest and will follow-up in 2 weeks to go over results.  He will also be referred to GI for evaluation for EGD.  If no findings on CT or EGD will refer back to Dr. Wilburn Cornelia.  I have discussed with pt and family he may continue to have symptoms due to prior therapy.  He denies any current smoking or alcohol use.    3.  Hypothyroidism: TSH was 1.112 which was WNL in 02/2018.  He should continue synthroid and follow-up with PCP for monitoring.    4.  Prostate cancer.  Managed by urology/PCP. Recent CT of abdomen and pelvis done 03/2018 showed no findings concerning for metastatic prostate cancer.    5.  Kidney cysts.  This was noted on CT abdomen and pelvis done 03/2018.  He will have repeat imaging in 09/2018 for follow-up.    Interval  History:  Stage IVB squamous cell carcinoma with (L) neck mets/unknown primary (likely H&N origin); p16-.  Pt was diagnosed in 04/2014. Treated with definitive radiation, completing treatment on 12/25/14.  He was recently seen by Dr. Isidore Moos of [facility] 03/12/18 with documentation that he remains without evidence of disease.  Pt had CT abdomen and pelvis done 03/2018 that showed kidney cyst.  He has also been seen by  ENT physician, Dr. Wilburn Cornelia.   Current Status:  Pt is complaining of persistent hoarseness.  He reports he was seen by Dr. Wilburn Cornelia with negative ENT exam. He denies smoking or alcohol use.    PATHOLOGY:  (L) neck biopsy: 05/25/14     Squamous cell carcinoma of head and neck ([place])   05/05/2014 Imaging    CT scan of the neck show enlarged left lymph node measure less than 6 cm.      05/25/2014 Pathology Results    [phone] is positive for squamous cell carcinoma.      05/25/2014 Procedure    Lymph node biopsy of the left neck came back squamous cell carcinoma.      06/20/2014 Imaging    PET CT scan show no evidence of distant metastatic disease.      06/29/2014 Surgery    Laryngoscopy and random biopsy of the nasopharynx was negative for malignancy.      07/27/2014 -  09/27/2014 Chemotherapy    Cisplatin + 5FU (days 1-5) every 28 days 4 cycles      11/06/2014 - 12/25/2014 Radiation Therapy    In [place] with Dr. Isidore Moos      04/25/2015 Imaging    PET - [place].  No evidence of hypermetabolic recurrent or metastatic disease.      05/17/2015 Pathology Results    [id number]:  p16 (HPV) negative.        Problem List Patient Active Problem List   Diagnosis Date Noted  . Perianal abscess [K61.0] 08/10/2017  . Vitamin D deficiency [E55.9] 02/08/2015  . Squamous cell carcinoma of head and neck ([place]) [C76.0] 07/12/2014  . History of prostate cancer [Z85.46] 07/12/2014  . Shingles rash [B02.9] 02/03/2014  . Abnormal transaminases [R74.8] 10/02/2013  . Hypothyroid [E03.9]  10/02/2013  . Blind right eye [H54.40] 09/30/2013  . Dyslipidemia [E78.5] 09/30/2013  . Hypertension [I10]   . Graves disease [E05.00]     Past Medical History Past Medical History:  Diagnosis Date  . Arthritis   . Blindness of right eye   . Cancer ([place])    pharyngeal ca  . Cataract, secondary obscuring vision    LEFT  . Graves disease   . History of radiation therapy 11/2014   [id number] squamous cell carcinoma of L neck, Stage IVB, unknown primary 70 Gy in 35 fractions treated at [facility], finished 12/25/14  . History of shingles    RASH  02-03-2014  PER PCP RESOLVING  . Hypertension    NO MEDS ,  MONITORED BY PCP  . Hypothyroidism, postradioiodine therapy   . Prostate cancer ([place])    DX  SEVERAL  YRS   AGO  WITH RADIATION THERAPY  . Squamous cell carcinoma of head and neck ([place]) 07/12/2014  . 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 COLONOSCOPY  10/20/2011   Procedure: COLONOSCOPY;  Surgeon: Daneil Dolin, MD;  Location[facility];  Service: Endoscopy;  Laterality: N/A;  1:00 pm  . COLONOSCOPY N/A 02/03/2018   Procedure: COLONOSCOPY;  Surgeon: Daneil Dolin, MD;  Location: [facility];  Service: Endoscopy;  Laterality: N/A;  2:15  . CRYOABLATION N/A 04/07/2014   Procedure: SALVAGE CRYO ABLATION PROSTATE;  Surgeon: Lowella Bandy, MD;  Location: [facility];  Service: Urology;  Laterality: N/A;  . IR GENERIC HISTORICAL  03/27/2017   IR REMOVAL TUN ACCESS W/ PORT W/O FL MOD SED [facility]-INTERV RAD  . LARYNGOSCOPY    . LYMPH NODE BIOPSY      Family History Family History  Problem Relation Age of Onset  . Colon cancer Father   . Hypertension Mother   . Colon cancer Brother   . Colon cancer Brother      Social History  reports that he quit smoking about 4 years ago. His smoking use included cigarettes. He has a 40.00 pack-year smoking history. He has never used  smokeless tobacco. He reports that he does not drink alcohol or use drugs.  Medications  Current Outpatient Medications:  .  cephALEXin (KEFLEX) 500 MG capsule, Take 1 capsule (500 mg total) by mouth 4 (four) times daily., Disp: 28 capsule, Rfl: 0 .  hydrochlorothiazide (HYDRODIURIL) 12.5 MG tablet, Take 1 tablet (12.5 mg total) by mouth daily., Disp: 90 tablet, Rfl: 1 .  levothyroxine (SYNTHROID, LEVOTHROID) 100 MCG tablet, Take 1 tablet (100 mcg total) by mouth daily., Disp: 90  tablet, Rfl: 1 .  pravastatin (PRAVACHOL) 40 MG tablet, Take 1 tablet (40 mg total) by mouth daily., Disp: 90 tablet, Rfl: 1 .  Vitamin D, Ergocalciferol, (DRISDOL) 50000 units CAPS capsule, TAKE 1 CAPSULE EVERY 7 DAYS., Disp: 12 capsule, Rfl: 1  Allergies Patient has no known allergies.  Review of Systems Review of Systems - Oncology ROS as per HPI otherwise 12 point ROS is negative.   Physical Exam  Vitals Wt Readings from Last 3 Encounters:  05/25/18 135 lb 8 oz (61.5 kg)  05/12/18 135 lb (61.2 kg)  04/27/18 137 lb (62.1 kg)   Temp Readings from Last 3 Encounters:  05/25/18 97.7 F (36.5 C) (Oral)  05/12/18 99.1 F (37.3 C) (Oral)  04/27/18 (!) 97 F (36.1 C) (Oral)   BP Readings from Last 3 Encounters:  05/25/18 (!) 164/82  05/12/18 137/74  04/27/18 137/81   Pulse Readings from Last 3 Encounters:  05/25/18 70  05/12/18 82  04/27/18 67    Constitutional: Thin male  in no distress.   HENT: Head: Normocephalic and atraumatic.  Mouth/Throat: No oropharyngeal exudate. Mucosa moist. Eyes: Pupils are equal, round, and reactive to light. Conjunctivae are normal. No scleral icterus.  Neck: Normal range of motion. Neck supple. No JVD present.  Cardiovascular: Normal rate, regular rhythm and normal heart sounds.  Exam reveals no gallop and no friction rub.   No murmur heard. Pulmonary/Chest: Effort normal and breath sounds normal. No respiratory distress. No wheezes.No rales.  Abdominal: Soft.  Bowel sounds are normal. No distension. There is no tenderness. There is no guarding.  Musculoskeletal: No edema or tenderness.  Lymphadenopathy: No cervical, axillary or supraclavicular adenopathy.  Neurological: Alert and oriented to person, place, and time. No cranial nerve deficit.  Skin: Skin is warm and dry. No rash noted. No erythema. No pallor.  Psychiatric: Affect and judgment normal.   Labs No visits with results within 3 Day(s) from this visit.  Latest known visit with results is:  Office Visit on 05/12/2018  Component Date Value Ref Range Status  . Specific Gravity, UA 05/12/2018 1.025  1.005 - 1.030 Final  . pH, UA 05/12/2018 5.5  5.0 - 7.5 Final  . Color, UA 05/12/2018 Yellow  Yellow Final  . Appearance Ur 05/12/2018 Cloudy* Clear Final  . Leukocytes, UA 05/12/2018 1+* Negative Final  . Protein, UA 05/12/2018 3+* Negative/Trace Final  . Glucose, UA 05/12/2018 Negative  Negative Final  . Ketones, UA 05/12/2018 1+* Negative Final  . RBC, UA 05/12/2018 3+* Negative Final  . Bilirubin, UA 05/12/2018 Negative  Negative Final  . Urobilinogen, Ur 05/12/2018 0.2  0.2 - 1.0 mg/dL Final  . Nitrite, UA 05/12/2018 Positive* Negative Final  . Microscopic Examination 05/12/2018 See below:   Final  . WBC, UA 05/12/2018 >30* 0 - 5 /hpf Final  . RBC, UA 05/12/2018 >30* 0 - 2 /hpf Final  . Epithelial Cells (non renal) 05/12/2018 0-10  0 - 10 /hpf Final  . Renal Epithel, UA 05/12/2018 None seen  None seen /hpf Final  . Bacteria, UA 05/12/2018 Many* None seen/Few Final     Pathology Orders Placed This Encounter  Procedures  . CT SOFT TISSUE NECK W CONTRAST    Standing Status:   Future    Standing Expiration Date:   08/26/2019    Order Specific Question:   If indicated for the ordered procedure, I authorize the administration of contrast media per Radiology protocol    Answer:   Yes  Order Specific Question:   Preferred imaging location?    Answer:   [facility]    Order  Specific Question:   Radiology Contrast Protocol - do NOT remove file path    Answer:   \\[email or web address]  . CT CHEST W CONTRAST    Standing Status:   Future    Standing Expiration Date:   05/25/2019    Order Specific Question:   If indicated for the ordered procedure, I authorize the administration of contrast media per Radiology protocol    Answer:   Yes    Order Specific Question:   Preferred imaging location?    Answer:   [facility]    Order Specific Question:   Radiology Contrast Protocol - do NOT remove file path    Answer:   \\[email or web address]  . CBC with Differential/Platelet    Standing Status:   Future    Standing Expiration Date:   05/26/2019  . Comprehensive metabolic panel    Standing Status:   Future    Standing Expiration Date:   05/26/2019  . Lactate dehydrogenase    Standing Status:   Future    Standing Expiration Date:   05/26/2019  . TSH    Standing Status:   Future    Standing Expiration Date:   05/26/2019       Zoila Shutter MD

## 2018-05-25 NOTE — Patient Instructions (Signed)
[facility]  Discharge Instructions:   Today you saw Dr. Higgs.    _______________________________________________________________  Thank you for choosing [facility] at [facility] to provide your oncology and hematology care.  To afford each patient quality time with our providers, please arrive at least 15 minutes before your scheduled appointment.  You need to re-schedule your appointment if you arrive 10 or more minutes late.  We strive to give you quality time with our providers, and arriving late affects you and other patients whose appointments are after yours.  Also, if you no show three or more times for appointments you may be dismissed from the clinic.  Again, thank you for choosing [facility]. Our hope is that these requests will allow you access to exceptional care and in a timely manner. _______________________________________________________________  If you have questions after your visit, please contact our office at [phone] between the hours of 8:30 a.m. and 5:00 p.m. Voicemails left after 4:30 p.m. will not be returned until the following business day. _______________________________________________________________  For prescription refill requests, have your pharmacy contact our office. _______________________________________________________________  Recommendations made by the consultant and any test results will be sent to your referring physician. _______________________________________________________________ 

## 2018-06-10 ENCOUNTER — Ambulatory Visit (HOSPITAL_COMMUNITY)
Admission: RE | Admit: 2018-06-10 | Discharge: 2018-06-10 | Disposition: A | Discharge status: Home / Self Care | Payer: Medicare HMO | Source: Ambulatory Visit | Attending: Internal Medicine | Admitting: Internal Medicine

## 2018-06-10 DIAGNOSIS — C760 Malignant neoplasm of head, face and neck: Secondary | ICD-10-CM | POA: Diagnosis not present

## 2018-06-10 DIAGNOSIS — I700 Atherosclerosis of aorta: Secondary | ICD-10-CM | POA: Insufficient documentation

## 2018-06-10 DIAGNOSIS — Z5111 Encounter for antineoplastic chemotherapy: Secondary | ICD-10-CM | POA: Diagnosis not present

## 2018-06-10 DIAGNOSIS — K7689 Other specified diseases of liver: Secondary | ICD-10-CM | POA: Insufficient documentation

## 2018-06-10 DIAGNOSIS — C4442 Squamous cell carcinoma of skin of scalp and neck: Secondary | ICD-10-CM

## 2018-06-10 LAB — POCT I-STAT CREATININE: CREATININE: 1.1 mg/dL (ref 0.61–1.24)

## 2018-06-10 MED ORDER — IOPAMIDOL (ISOVUE-300) INJECTION 61%
100.0000 mL | Freq: Once | INTRAVENOUS | Status: AC | PRN
Start: 2018-06-10 — End: 2018-06-10
  Administered 2018-06-10: 75 mL via INTRAVENOUS

## 2018-06-16 ENCOUNTER — Inpatient Hospital Stay (HOSPITAL_COMMUNITY): Payer: Medicare HMO | Attending: Internal Medicine | Admitting: Internal Medicine

## 2018-06-16 ENCOUNTER — Encounter (HOSPITAL_COMMUNITY): Payer: Self-pay | Admitting: Internal Medicine

## 2018-06-16 ENCOUNTER — Other Ambulatory Visit: Payer: Self-pay

## 2018-06-16 VITALS — BP 121/74 | HR 93 | Temp 98.6°F | Resp 16 | Wt 132.4 lb

## 2018-06-16 DIAGNOSIS — Z9221 Personal history of antineoplastic chemotherapy: Secondary | ICD-10-CM | POA: Diagnosis not present

## 2018-06-16 DIAGNOSIS — E559 Vitamin D deficiency, unspecified: Secondary | ICD-10-CM | POA: Diagnosis not present

## 2018-06-16 DIAGNOSIS — E039 Hypothyroidism, unspecified: Secondary | ICD-10-CM | POA: Diagnosis not present

## 2018-06-16 DIAGNOSIS — R490 Dysphonia: Secondary | ICD-10-CM | POA: Diagnosis not present

## 2018-06-16 DIAGNOSIS — Z800 Family history of malignant neoplasm of digestive organs: Secondary | ICD-10-CM | POA: Insufficient documentation

## 2018-06-16 DIAGNOSIS — I10 Essential (primary) hypertension: Secondary | ICD-10-CM | POA: Diagnosis not present

## 2018-06-16 DIAGNOSIS — C760 Malignant neoplasm of head, face and neck: Secondary | ICD-10-CM

## 2018-06-16 DIAGNOSIS — E785 Hyperlipidemia, unspecified: Secondary | ICD-10-CM | POA: Insufficient documentation

## 2018-06-16 DIAGNOSIS — Z87891 Personal history of nicotine dependence: Secondary | ICD-10-CM

## 2018-06-16 DIAGNOSIS — Z923 Personal history of irradiation: Secondary | ICD-10-CM | POA: Diagnosis not present

## 2018-06-16 DIAGNOSIS — Z8589 Personal history of malignant neoplasm of other organs and systems: Secondary | ICD-10-CM | POA: Insufficient documentation

## 2018-06-16 DIAGNOSIS — Z79899 Other long term (current) drug therapy: Secondary | ICD-10-CM | POA: Diagnosis not present

## 2018-06-16 DIAGNOSIS — Z8546 Personal history of malignant neoplasm of prostate: Secondary | ICD-10-CM | POA: Insufficient documentation

## 2018-06-16 DIAGNOSIS — N281 Cyst of kidney, acquired: Secondary | ICD-10-CM | POA: Insufficient documentation

## 2018-06-16 NOTE — Progress Notes (Signed)
Diagnosis No diagnosis found.  Staging Cancer Staging Squamous cell carcinoma of head and neck ([place]) Staging form: Pharynx - Nasopharynx, AJCC 7th Edition - Clinical: Stage IVB (TX, N3a, M0) - Signed by Eppie Gibson, MD on 07/13/2014   Assessment and Plan:  1.  Stage IVB squamous cell carcinoma with (L) neck mets/unknown primary (likely H&N origin); p16-. Pt was diagnosed in 04/2014. Treated with definitive radiation, completing treatment on 12/25/14.  He was recently seen by Dr. Isidore Moos of [facility] 03/12/18 with documentation that he remains without evidence of disease.  Pt had CT abdomen and pelvis done 03/2018 that showed kidney cyst.  He has also been seen by  ENT physician, Dr. Wilburn Cornelia.   Pt was complaining of persistent hoarseness.  He was set up for CT of neck and chest and is here to go over results.  Scans reviewed with pt and family.   CT neck done 06/10/2018 shows IMPRESSION: Recurrent mass with the epicenter in the right supraglottic region extending from the base of the epiglottis down to just below the level of the cords. Overall measurement approximately 4.4 x 3 x 3 cm. Displacement of the airway towards the left. Single suspicious level 2 lymph node on the right, not extremely enlarged but showing some enhancement.  CT chest done 06/10/2018 also revieed and  shows  IMPRESSION: 1. No evidence for mediastinal or hilar mass lesion. Bilateral hilar lymph nodes are upper normal to borderline enlarged and continued attention on follow-up recommended. 2. Tiny hypervascular lesions in the liver stable back to 03/09/2014, most consistent with benign etiology. 3.  Aortic Atherosclerois (ICD10-170.0)  I have spoken with Dr. Wilburn Cornelia today regarding scan results and he will see the pt for follow-up this week to determine if biopsy is recommended. PT has no respiratory concerns.   Pending path results, will also request HPV and PDL1 testing.  He will [facility] in 7-10 days to go over results.      2.  Persistent hoarseness. I have reviewed CT of neck and chest that was done 06/10/2018 with pt and shows ? Recurrent mass.  I have spoken with Dr. Wilburn Cornelia who will review scans and set up for biopsy pending his evaluation.  He denies any current smoking or alcohol use.    3.  Hypothyroidism: TSH was 1.112 which was WNL in 02/2018.  He should continue synthroid and follow-up with PCP for monitoring.    4.  Prostate cancer.  Managed by urology/PCP. CT of abdomen and pelvis done 03/2018 showed no findings concerning for metastatic prostate cancer.    5.  Kidney cysts.  This was noted on CT abdomen and pelvis done 03/2018.  He will have repeat imaging in 09/2018 for follow-up.    Interval History:  Historical data obtained from note dated 05/25/2018:  Stage IVB squamous cell carcinoma with (L) neck mets/unknown primary (likely H&N origin); p16-.  Pt was diagnosed in 04/2014. Treated with definitive radiation, completing treatment on 12/25/14.  He was recently seen by Dr. Isidore Moos of [facility] 03/12/18 with documentation that he remains without evidence of disease.  Pt had CT abdomen and pelvis done 03/2018 that showed kidney cyst.  He has also been seen by  ENT physician, Dr. Wilburn Cornelia.   Current Status:  Pt is here to go over scans.  He is accompanied by family.   He denies smoking or alcohol use.    PATHOLOGY:  (L) neck biopsy: 05/25/14     Squamous cell carcinoma of head and neck ([place])  05/05/2014 Imaging    CT scan of the neck show enlarged left lymph node measure less than 6 cm.      05/25/2014 Pathology Results    [phone] is positive for squamous cell carcinoma.      05/25/2014 Procedure    Lymph node biopsy of the left neck came back squamous cell carcinoma.      06/20/2014 Imaging    PET CT scan show no evidence of distant metastatic disease.      06/29/2014 Surgery    Laryngoscopy and random biopsy of the nasopharynx was negative for malignancy.      07/27/2014 - 09/27/2014 Chemotherapy     Cisplatin + 5FU (days 1-5) every 28 days 4 cycles      11/06/2014 - 12/25/2014 Radiation Therapy    In [place] with Dr. Isidore Moos      04/25/2015 Imaging    PET - [facility].  No evidence of hypermetabolic recurrent or metastatic disease.      05/17/2015 Pathology Results    [id number]:  p16 (HPV) negative.        Problem List Patient Active Problem List   Diagnosis Date Noted  . Perianal abscess [K61.0] 08/10/2017  . Vitamin D deficiency [E55.9] 02/08/2015  . Squamous cell carcinoma of head and neck ([place]) [C76.0] 07/12/2014  . History of prostate cancer [Z85.46] 07/12/2014  . Shingles rash [B02.9] 02/03/2014  . Abnormal transaminases [R74.8] 10/02/2013  . Hypothyroid [E03.9] 10/02/2013  . Blind right eye [H54.40] 09/30/2013  . Dyslipidemia [E78.5] 09/30/2013  . Hypertension [I10]   . Graves disease [E05.00]     Past Medical History Past Medical History:  Diagnosis Date  . Arthritis   . Blindness of right eye   . Cancer ([place])    pharyngeal ca  . Cataract, secondary obscuring vision    LEFT  . Graves disease   . History of radiation therapy 11/2014   TxN3MO squamous cell carcinoma of L neck, Stage IVB, unknown primary 70 Gy in 35 fractions treated [facility], finished 12/25/14  . History of shingles    RASH  02-03-2014  PER PCP RESOLVING  . Hypertension    NO MEDS ,  MONITORED BY PCP  . Hypothyroidism, postradioiodine therapy   . Prostate cancer ([place])    DX  SEVERAL  YRS   AGO  WITH RADIATION THERAPY  . Squamous cell carcinoma of head and neck ([place]) 07/12/2014  . 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 COLONOSCOPY  10/20/2011   Procedure: COLONOSCOPY;  Surgeon: Daneil Dolin, MD;  [facility];  Service: Endoscopy;  Laterality: N/A;  1:00 pm  . COLONOSCOPY N/A 02/03/2018   Procedure: COLONOSCOPY;  Surgeon: Daneil Dolin, MD;  [facility];   Service: Endoscopy;  Laterality: N/A;  2:15  . CRYOABLATION N/A 04/07/2014   Procedure: SALVAGE CRYO ABLATION PROSTATE;  Surgeon: Lowella Bandy, MD;  Location[facility];  Service: Urology;  Laterality: N/A;  . IR GENERIC HISTORICAL  03/27/2017   IR REMOVAL TUN ACCESS W/ PORT W/O FL MOD SED [facility]-INTERV RAD  . LARYNGOSCOPY    . LYMPH NODE BIOPSY      Family History Family History  Problem Relation Age of Onset  . Colon cancer Father   . Hypertension Mother   . Colon cancer Brother   . Colon cancer Brother      Social History  reports  that he quit smoking about 4 years ago. His smoking use included cigarettes. He has a 40.00 pack-year smoking history. He has never used smokeless tobacco. He reports that he does not drink alcohol or use drugs.  Medications  Current Outpatient Medications:  .  cephALEXin (KEFLEX) 500 MG capsule, Take 1 capsule (500 mg total) by mouth 4 (four) times daily., Disp: 28 capsule, Rfl: 0 .  hydrochlorothiazide (HYDRODIURIL) 12.5 MG tablet, Take 1 tablet (12.5 mg total) by mouth daily., Disp: 90 tablet, Rfl: 1 .  levothyroxine (SYNTHROID, LEVOTHROID) 100 MCG tablet, Take 1 tablet (100 mcg total) by mouth daily., Disp: 90 tablet, Rfl: 1 .  pravastatin (PRAVACHOL) 40 MG tablet, Take 1 tablet (40 mg total) by mouth daily., Disp: 90 tablet, Rfl: 1 .  Vitamin D, Ergocalciferol, (DRISDOL) 50000 units CAPS capsule, TAKE 1 CAPSULE EVERY 7 DAYS., Disp: 12 capsule, Rfl: 1  Allergies Patient has no known allergies.  Review of Systems Review of Systems - Oncology ROS negative other than persistent hoarseness.     Physical Exam  Vitals Wt Readings from Last 3 Encounters:  06/16/18 132 lb 6.4 oz (60.1 kg)  05/25/18 135 lb 8 oz (61.5 kg)  05/12/18 135 lb (61.2 kg)   Temp Readings from Last 3 Encounters:  06/16/18 98.6 F (37 C) (Oral)  05/25/18 97.7 F (36.5 C) (Oral)  05/12/18 99.1 F (37.3 C) (Oral)   BP Readings from Last 3 Encounters:    06/16/18 121/74  05/25/18 (!) 164/82  05/12/18 137/74   Pulse Readings from Last 3 Encounters:  06/16/18 93  05/25/18 70  05/12/18 82    Constitutional: Well-developed, well-nourished, and in no distress.   HENT: Head: Normocephalic and atraumatic.  Mouth/Throat: No oropharyngeal exudate. Mucosa moist. Eyes: Pupils are equal, round, and reactive to light. Conjunctivae are normal. No scleral icterus.  Neck: Normal range of motion. Neck supple. No JVD present. Evidence of prior RT.   Cardiovascular: Normal rate, regular rhythm and normal heart sounds.  Exam reveals no gallop and no friction rub.   No murmur heard. Pulmonary/Chest: Effort normal and breath sounds normal. No respiratory distress. No wheezes.No rales.  Abdominal: Soft. Bowel sounds are normal. No distension. There is no tenderness. There is no guarding.  Musculoskeletal: No edema or tenderness.  Lymphadenopathy:RT changes noted in cervical area on left.  No axillary or supraclavicular adenopathy.  Neurological: Alert and oriented to person, place, and time. No cranial nerve deficit.  Skin: Skin is warm and dry. No rash noted. No erythema. No pallor.  Psychiatric: Affect and judgment normal.   Labs No visits with results within 3 Day(s) from this visit.  Latest known visit with results is:  Hospital Outpatient Visit on 06/10/2018  Component Date Value Ref Range Status  . Creatinine, Ser 06/10/2018 1.10  0.61 - 1.24 mg/dL Final     Pathology No orders of the defined types were placed in this encounter.      Zoila Shutter MD

## 2018-06-18 DIAGNOSIS — Z8589 Personal history of malignant neoplasm of other organs and systems: Secondary | ICD-10-CM | POA: Diagnosis not present

## 2018-06-18 DIAGNOSIS — D380 Neoplasm of uncertain behavior of larynx: Secondary | ICD-10-CM | POA: Diagnosis not present

## 2018-06-18 DIAGNOSIS — Z923 Personal history of irradiation: Secondary | ICD-10-CM | POA: Diagnosis not present

## 2018-06-18 DIAGNOSIS — J3801 Paralysis of vocal cords and larynx, unilateral: Secondary | ICD-10-CM | POA: Diagnosis not present

## 2018-06-18 DIAGNOSIS — Z87891 Personal history of nicotine dependence: Secondary | ICD-10-CM | POA: Diagnosis not present

## 2018-06-29 ENCOUNTER — Ambulatory Visit (HOSPITAL_COMMUNITY): Payer: Medicare HMO | Admitting: Hematology

## 2018-07-02 DIAGNOSIS — Z8579 Personal history of other malignant neoplasms of lymphoid, hematopoietic and related tissues: Secondary | ICD-10-CM | POA: Diagnosis not present

## 2018-07-02 DIAGNOSIS — H6122 Impacted cerumen, left ear: Secondary | ICD-10-CM | POA: Diagnosis not present

## 2018-07-02 DIAGNOSIS — K08109 Complete loss of teeth, unspecified cause, unspecified class: Secondary | ICD-10-CM | POA: Diagnosis not present

## 2018-07-02 DIAGNOSIS — Z87891 Personal history of nicotine dependence: Secondary | ICD-10-CM | POA: Diagnosis not present

## 2018-07-02 DIAGNOSIS — Z923 Personal history of irradiation: Secondary | ICD-10-CM | POA: Diagnosis not present

## 2018-07-02 DIAGNOSIS — D380 Neoplasm of uncertain behavior of larynx: Secondary | ICD-10-CM | POA: Diagnosis not present

## 2018-07-09 ENCOUNTER — Ambulatory Visit (HOSPITAL_COMMUNITY): Payer: Medicare HMO | Admitting: Internal Medicine

## 2018-07-09 ENCOUNTER — Other Ambulatory Visit (HOSPITAL_COMMUNITY): Payer: Self-pay | Admitting: Otolaryngology

## 2018-07-14 ENCOUNTER — Other Ambulatory Visit (HOSPITAL_COMMUNITY): Payer: Self-pay | Admitting: Otolaryngology

## 2018-07-14 DIAGNOSIS — J387 Other diseases of larynx: Secondary | ICD-10-CM

## 2018-07-22 ENCOUNTER — Encounter (HOSPITAL_COMMUNITY)
Admission: RE | Admit: 2018-07-22 | Discharge: 2018-07-22 | Disposition: A | Discharge status: Home / Self Care | Payer: Medicare HMO | Source: Ambulatory Visit | Attending: Otolaryngology | Admitting: Otolaryngology

## 2018-07-22 ENCOUNTER — Encounter (HOSPITAL_COMMUNITY): Payer: Self-pay

## 2018-07-22 DIAGNOSIS — J387 Other diseases of larynx: Secondary | ICD-10-CM

## 2018-07-27 DIAGNOSIS — C61 Malignant neoplasm of prostate: Secondary | ICD-10-CM | POA: Diagnosis not present

## 2018-07-30 DIAGNOSIS — R55 Syncope and collapse: Secondary | ICD-10-CM | POA: Diagnosis not present

## 2018-07-30 DIAGNOSIS — R0902 Hypoxemia: Secondary | ICD-10-CM | POA: Diagnosis not present

## 2018-07-30 DIAGNOSIS — E860 Dehydration: Secondary | ICD-10-CM | POA: Diagnosis not present

## 2018-08-02 ENCOUNTER — Ambulatory Visit: Payer: Medicare HMO | Admitting: Nurse Practitioner

## 2018-08-03 ENCOUNTER — Ambulatory Visit: Payer: Medicare HMO | Admitting: Urology

## 2018-08-03 ENCOUNTER — Other Ambulatory Visit (HOSPITAL_COMMUNITY)
Admission: AD | Admit: 2018-08-03 | Discharge: 2018-08-03 | Disposition: A | Discharge status: Home / Self Care | Payer: Medicare HMO | Source: Skilled Nursing Facility | Attending: Urology | Admitting: Urology

## 2018-08-03 DIAGNOSIS — R9721 Rising PSA following treatment for malignant neoplasm of prostate: Secondary | ICD-10-CM

## 2018-08-03 DIAGNOSIS — C61 Malignant neoplasm of prostate: Secondary | ICD-10-CM

## 2018-08-06 ENCOUNTER — Encounter (HOSPITAL_COMMUNITY)
Admission: RE | Admit: 2018-08-06 | Discharge: 2018-08-06 | Disposition: A | Discharge status: Home / Self Care | Payer: Medicare HMO | Source: Ambulatory Visit | Attending: Otolaryngology | Admitting: Otolaryngology

## 2018-08-06 DIAGNOSIS — J387 Other diseases of larynx: Secondary | ICD-10-CM | POA: Insufficient documentation

## 2018-08-06 DIAGNOSIS — C4442 Squamous cell carcinoma of skin of scalp and neck: Secondary | ICD-10-CM | POA: Diagnosis not present

## 2018-08-06 LAB — URINE CULTURE

## 2018-08-06 LAB — GLUCOSE, CAPILLARY: Glucose-Capillary: 99 mg/dL (ref 70–99)

## 2018-08-06 MED ORDER — FLUDEOXYGLUCOSE F - 18 (FDG) INJECTION
6.6300 | Freq: Once | INTRAVENOUS | Status: AC | PRN
  Administered 2018-08-06: 6.63 via INTRAVENOUS

## 2018-08-19 ENCOUNTER — Inpatient Hospital Stay (HOSPITAL_COMMUNITY)
Admission: AD | Admit: 2018-08-19 | Discharge: 2018-08-24 | DRG: 146 | Disposition: A | Discharge status: Home Health | Payer: Medicare HMO | Source: Ambulatory Visit | Attending: Otolaryngology | Admitting: Otolaryngology

## 2018-08-19 ENCOUNTER — Other Ambulatory Visit: Payer: Self-pay | Admitting: Otolaryngology

## 2018-08-19 DIAGNOSIS — Z800 Family history of malignant neoplasm of digestive organs: Secondary | ICD-10-CM

## 2018-08-19 DIAGNOSIS — Z8249 Family history of ischemic heart disease and other diseases of the circulatory system: Secondary | ICD-10-CM | POA: Diagnosis not present

## 2018-08-19 DIAGNOSIS — R490 Dysphonia: Secondary | ICD-10-CM | POA: Diagnosis not present

## 2018-08-19 DIAGNOSIS — E039 Hypothyroidism, unspecified: Secondary | ICD-10-CM | POA: Diagnosis not present

## 2018-08-19 DIAGNOSIS — J387 Other diseases of larynx: Secondary | ICD-10-CM | POA: Diagnosis not present

## 2018-08-19 DIAGNOSIS — C329 Malignant neoplasm of larynx, unspecified: Secondary | ICD-10-CM

## 2018-08-19 DIAGNOSIS — C321 Malignant neoplasm of supraglottis: Principal | ICD-10-CM | POA: Diagnosis present

## 2018-08-19 DIAGNOSIS — Z87891 Personal history of nicotine dependence: Secondary | ICD-10-CM

## 2018-08-19 DIAGNOSIS — E43 Unspecified severe protein-calorie malnutrition: Secondary | ICD-10-CM | POA: Diagnosis present

## 2018-08-19 DIAGNOSIS — Z79890 Hormone replacement therapy (postmenopausal): Secondary | ICD-10-CM | POA: Diagnosis not present

## 2018-08-19 DIAGNOSIS — Z8589 Personal history of malignant neoplasm of other organs and systems: Secondary | ICD-10-CM | POA: Diagnosis not present

## 2018-08-19 DIAGNOSIS — R1310 Dysphagia, unspecified: Secondary | ICD-10-CM | POA: Diagnosis not present

## 2018-08-19 DIAGNOSIS — I10 Essential (primary) hypertension: Secondary | ICD-10-CM | POA: Diagnosis present

## 2018-08-19 DIAGNOSIS — Z8546 Personal history of malignant neoplasm of prostate: Secondary | ICD-10-CM | POA: Diagnosis not present

## 2018-08-19 DIAGNOSIS — Z923 Personal history of irradiation: Secondary | ICD-10-CM

## 2018-08-19 DIAGNOSIS — R1312 Dysphagia, oropharyngeal phase: Secondary | ICD-10-CM | POA: Diagnosis not present

## 2018-08-19 DIAGNOSIS — E890 Postprocedural hypothyroidism: Secondary | ICD-10-CM | POA: Diagnosis present

## 2018-08-19 DIAGNOSIS — Z931 Gastrostomy status: Secondary | ICD-10-CM | POA: Diagnosis not present

## 2018-08-19 DIAGNOSIS — Z856 Personal history of leukemia: Secondary | ICD-10-CM | POA: Diagnosis not present

## 2018-08-19 LAB — COMPREHENSIVE METABOLIC PANEL
ALT: 12 U/L (ref 0–44)
ANION GAP: 12 (ref 5–15)
AST: 23 U/L (ref 15–41)
Albumin: 3.5 g/dL (ref 3.5–5.0)
Alkaline Phosphatase: 57 U/L (ref 38–126)
BUN: 14 mg/dL (ref 8–23)
CHLORIDE: 95 mmol/L — AB (ref 98–111)
CO2: 29 mmol/L (ref 22–32)
Calcium: 9.4 mg/dL (ref 8.9–10.3)
Creatinine, Ser: 0.92 mg/dL (ref 0.61–1.24)
Glucose, Bld: 77 mg/dL (ref 70–99)
POTASSIUM: 3.1 mmol/L — AB (ref 3.5–5.1)
SODIUM: 136 mmol/L (ref 135–145)
Total Bilirubin: 0.8 mg/dL (ref 0.3–1.2)
Total Protein: 7.1 g/dL (ref 6.5–8.1)

## 2018-08-19 LAB — MAGNESIUM: MAGNESIUM: 2.2 mg/dL (ref 1.7–2.4)

## 2018-08-19 LAB — CBC
HCT: 43.5 % (ref 39.0–52.0)
HEMOGLOBIN: 13.1 g/dL (ref 13.0–17.0)
MCH: 24.3 pg — ABNORMAL LOW (ref 26.0–34.0)
MCHC: 30.1 g/dL (ref 30.0–36.0)
MCV: 80.9 fL (ref 78.0–100.0)
Platelets: 279 10*3/uL (ref 150–400)
RBC: 5.38 MIL/uL (ref 4.22–5.81)
RDW: 15.2 % (ref 11.5–15.5)
WBC: 7.1 10*3/uL (ref 4.0–10.5)

## 2018-08-19 LAB — PHOSPHORUS: PHOSPHORUS: 2.5 mg/dL (ref 2.5–4.6)

## 2018-08-19 LAB — TSH: TSH: 9.206 u[IU]/mL — AB (ref 0.350–4.500)

## 2018-08-19 LAB — APTT: APTT: 31 s (ref 24–36)

## 2018-08-19 LAB — SURGICAL PCR SCREEN
MRSA, PCR: NEGATIVE
STAPHYLOCOCCUS AUREUS: NEGATIVE

## 2018-08-19 MED ORDER — KCL IN DEXTROSE-NACL 20-5-0.45 MEQ/L-%-% IV SOLN
INTRAVENOUS | Status: DC
  Administered 2018-08-19 – 2018-08-23 (×6): via INTRAVENOUS
  Filled 2018-08-19 (×7): qty 1000

## 2018-08-19 MED ORDER — MUPIROCIN 2 % EX OINT
1.0000 "application " | TOPICAL_OINTMENT | Freq: Two times a day (BID) | CUTANEOUS | Status: DC
  Administered 2018-08-19 – 2018-08-20 (×2): 1 via NASAL
  Filled 2018-08-19 (×2): qty 22

## 2018-08-19 NOTE — Plan of Care (Signed)
  Problem: Education: Goal: Knowledge of General Education information will improve Description Including pain rating scale, medication(s)/side effects and non-pharmacologic comfort measures Outcome: Progressing   Problem: Nutrition: Goal: Adequate nutrition will be maintained Outcome: Not Progressing   Problem: Safety: Goal: Ability to remain free from injury will improve Outcome: Progressing

## 2018-08-19 NOTE — H&P (Signed)
Brandon Hall is an 62 y.o. male.   Chief Complaint: Inability to swallow HPI: History of laryngeal cancer, status post radiation therapy persistent tumor.  Having difficulty swallowing, aspirating.  Significant malnutrition, weight loss.  Planning total laryngectomy in the near future.  Past Medical History:  Diagnosis Date  . Arthritis   . Blindness of right eye   . Cancer ([place])    pharyngeal ca  . Cataract, secondary obscuring vision    LEFT  . Graves disease   . History of radiation therapy 11/2014   TxN3MO squamous cell carcinoma of L neck, Stage IVB, unknown primary 70 Gy in 35 fractions treated at [facility], finished 12/25/14  . History of shingles    RASH  02-03-2014  PER PCP RESOLVING  . Hypertension    NO MEDS ,  MONITORED BY PCP  . Hypothyroidism, postradioiodine therapy   . Prostate cancer ([place])    DX  SEVERAL  YRS   AGO  WITH RADIATION THERAPY  . Squamous cell carcinoma of head and neck ([place]) 07/12/2014  . 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 COLONOSCOPY  10/20/2011   Procedure: COLONOSCOPY;  Surgeon: Daneil Dolin, MD;  [facility];  Service: Endoscopy;  Laterality: N/A;  1:00 pm  . COLONOSCOPY N/A 02/03/2018   Procedure: COLONOSCOPY;  Surgeon: Daneil Dolin, MD;  [facility];  Service: Endoscopy;  Laterality: N/A;  2:15  . CRYOABLATION N/A 04/07/2014   Procedure: SALVAGE CRYO ABLATION PROSTATE;  Surgeon: Lowella Bandy, MD;  Location: [facility];  Service: Urology;  Laterality: N/A;  . IR GENERIC HISTORICAL  03/27/2017   IR REMOVAL TUN ACCESS W/ PORT W/O FL MOD SED [facility]-INTERV RAD  . LARYNGOSCOPY    . LYMPH NODE BIOPSY      Family History  Problem Relation Age of Onset  . Colon cancer Father   . Hypertension Mother   . Colon cancer Brother   . Colon cancer Brother    Social History:  reports that he quit smoking about 4 years ago. His smoking use  included cigarettes. He has a 40.00 pack-year smoking history. He has never used smokeless tobacco. He reports that he does not drink alcohol or use drugs.  Allergies: No Known Allergies  Medications Prior to Admission  Medication Sig Dispense Refill  . cephALEXin (KEFLEX) 500 MG capsule Take 1 capsule (500 mg total) by mouth 4 (four) times daily. 28 capsule 0  . hydrochlorothiazide (HYDRODIURIL) 12.5 MG tablet Take 1 tablet (12.5 mg total) by mouth daily. 90 tablet 1  . levothyroxine (SYNTHROID, LEVOTHROID) 100 MCG tablet Take 1 tablet (100 mcg total) by mouth daily. 90 tablet 1  . pravastatin (PRAVACHOL) 40 MG tablet Take 1 tablet (40 mg total) by mouth daily. 90 tablet 1  . Vitamin D, Ergocalciferol, (DRISDOL) 50000 units CAPS capsule TAKE 1 CAPSULE EVERY 7 DAYS. 12 capsule 1    No results found for this or any previous visit (from the past 48 hour(s)). No results found.  ROS: otherwise negative  Blood pressure 126/77, pulse 92, temperature 98 F (36.7 C), temperature source Oral, resp. rate 18, SpO2 96 %.  PHYSICAL EXAM: Overall appearance: Cachectic, with strained and hoarse voice Head:  Normocephalic, atraumatic. Ears: External auditory canals are clear; tympanic membranes are intact and the middle ears are free of any effusion. Nose: External nose is healthy in appearance. Internal nasal exam free  of any lesions or obstruction. Oral Cavity/pharynx:  There are no mucosal lesions or masses identified. Hypopharynx/Larynx: Large supra tumor present with airway obstruction. Neuro:  No identifiable neurologic deficits. Neck: 1 level 2 node, otherwise no palpable neck masses.  Studies Reviewed: CT and PET scan reviewed.    Assessment/Plan Laryngeal cancer, severe malnutrition, dysphasia and aspiration.  Admit to hospital today for intravenous fluid support, gastrostomy tube placement as soon as possible.  In anticipation of total laryngectomy in the near future.  Izora Gala 08/19/2018, 8:54 PM

## 2018-08-20 ENCOUNTER — Encounter (HOSPITAL_COMMUNITY): Payer: Self-pay | Admitting: Interventional Radiology

## 2018-08-20 ENCOUNTER — Inpatient Hospital Stay (HOSPITAL_COMMUNITY): Payer: Medicare HMO

## 2018-08-20 DIAGNOSIS — E43 Unspecified severe protein-calorie malnutrition: Secondary | ICD-10-CM

## 2018-08-20 HISTORY — PX: IR GASTROSTOMY TUBE MOD SED: IMG625

## 2018-08-20 LAB — HIV ANTIBODY (ROUTINE TESTING W REFLEX): HIV SCREEN 4TH GENERATION: NONREACTIVE

## 2018-08-20 LAB — PROTIME-INR
INR: 1.17
PROTHROMBIN TIME: 14.8 s (ref 11.4–15.2)

## 2018-08-20 MED ORDER — CEFAZOLIN SODIUM-DEXTROSE 2-4 GM/100ML-% IV SOLN
2.0000 g | INTRAVENOUS | Status: AC
  Administered 2018-08-20: 2 g via INTRAVENOUS
  Filled 2018-08-20: qty 100

## 2018-08-20 MED ORDER — CEFAZOLIN SODIUM-DEXTROSE 2-4 GM/100ML-% IV SOLN
INTRAVENOUS | Status: AC
  Administered 2018-08-20: 18:00:00
  Filled 2018-08-20: qty 100

## 2018-08-20 MED ORDER — MORPHINE SULFATE (PF) 2 MG/ML IV SOLN
2.0000 mg | INTRAVENOUS | Status: DC | PRN
  Administered 2018-08-20: 2 mg via INTRAVENOUS
  Filled 2018-08-20: qty 1

## 2018-08-20 MED ORDER — HYDROCODONE-ACETAMINOPHEN 5-325 MG PO TABS
1.0000 | ORAL_TABLET | ORAL | Status: DC | PRN
  Administered 2018-08-21 – 2018-08-22 (×3): 1 via ORAL
  Filled 2018-08-20 (×4): qty 1

## 2018-08-20 MED ORDER — FENTANYL CITRATE (PF) 100 MCG/2ML IJ SOLN
INTRAMUSCULAR | Status: AC
  Filled 2018-08-20: qty 2

## 2018-08-20 MED ORDER — GLUCAGON HCL RDNA (DIAGNOSTIC) 1 MG IJ SOLR
INTRAMUSCULAR | Status: AC
  Administered 2018-08-20: 1 mg
  Filled 2018-08-20: qty 1

## 2018-08-20 MED ORDER — LIDOCAINE HCL 1 % IJ SOLN
INTRAMUSCULAR | Status: AC
  Administered 2018-08-20: 18:00:00
  Filled 2018-08-20: qty 20

## 2018-08-20 MED ORDER — LEVOTHYROXINE SODIUM 100 MCG IV SOLR
100.0000 ug | Freq: Every day | INTRAVENOUS | Status: DC
  Administered 2018-08-20 – 2018-08-22 (×3): 100 ug via INTRAVENOUS
  Filled 2018-08-20 (×3): qty 5

## 2018-08-20 MED ORDER — LIDOCAINE HCL (PF) 1 % IJ SOLN
INTRAMUSCULAR | Status: AC | PRN
  Administered 2018-08-20: 5 mL

## 2018-08-20 MED ORDER — FENTANYL CITRATE (PF) 100 MCG/2ML IJ SOLN
INTRAMUSCULAR | Status: AC | PRN
  Administered 2018-08-20 (×2): 50 ug via INTRAVENOUS
  Administered 2018-08-20: 18:00:00 via INTRAVENOUS

## 2018-08-20 MED ORDER — MIDAZOLAM HCL 2 MG/2ML IJ SOLN
INTRAMUSCULAR | Status: AC | PRN
  Administered 2018-08-20: 1 mg via INTRAVENOUS

## 2018-08-20 MED ORDER — MIDAZOLAM HCL 2 MG/2ML IJ SOLN
INTRAMUSCULAR | Status: AC
  Administered 2018-08-20: 18:00:00
  Filled 2018-08-20: qty 2

## 2018-08-20 MED ORDER — IOPAMIDOL (ISOVUE-300) INJECTION 61%
INTRAVENOUS | Status: AC
  Administered 2018-08-20: 5 mL
  Filled 2018-08-20: qty 50

## 2018-08-20 NOTE — Care Management Note (Signed)
Case Management Note  Patient Details  Name: Brandon Hall MRN: [id number] Date of Birth: 12/06/1956  Subjective/Objective:                    Action/Plan:  Patient for PEG placement for tube feedings.  08-20-18 Patient's cousin Brandon Hall helps take care of patient at home. Patient has consented and requested all medical team members to call her and explain plan of care.    Explained to patient , wife and [place] plan for PEG placement . Once placed will have diet consent for recommendations for home .   All want [facility].   Will continue to follow . Will need orders for tube feedings and HHRN.  Patient and family will be taught here before discharge how to administer tube feedings. Brandon Hall voiced understanding   Expected Discharge Date:                  Expected Discharge Plan:  [place]  In-House Referral:     Discharge planning Services  CM Consult  Post Acute Care Choice:  Durable Medical Equipment, Home Health Choice offered to:  Spouse, Patient  DME Arranged:    DME Agency:     HH Arranged:    [place] Agency:  [place]  Status of Service:  In process, will continue to follow  If discussed at Long Length of Stay Meetings, dates discussed:    Additional Comments:  Brandon Favre, RN 08/20/2018, 1:49 PM

## 2018-08-20 NOTE — Procedures (Signed)
20 Fr pull through G tube EBL 0 Comp 0 

## 2018-08-20 NOTE — Progress Notes (Signed)
Patient ID: Brandon Hall, male   DOB: June 10, 1956, 62 y.o.   MRN: [id number] Subjective: He is in the restroom on rounds but there were no problems overnight.  Objective: Vital signs in last 24 hours: Temp:  [97.9 F (36.6 C)-98.1 F (36.7 C)] 98.1 F (36.7 C) (08/23 0457) Pulse Rate:  [92-100] 100 (08/23 0457) Resp:  [16-18] 18 (08/23 0457) BP: (126-130)/(77-92) 130/92 (08/23 0457) SpO2:  [96 %-98 %] 98 % (08/23 0457) Weight change:  Last BM Date: 08/19/18  Intake/Output from previous day: 08/22 0701 - 08/23 0700 In: 525.7 [I.V.:525.7] Out: 400 [Urine:400] Intake/Output this shift: No intake/output data recorded.  PHYSICAL EXAM: No changes.  Lab Results: Recent Labs    08/19/18 2147  WBC 7.1  HGB 13.1  HCT 43.5  PLT 279   BMET Recent Labs    08/19/18 2147  NA 136  K 3.1*  CL 95*  CO2 29  GLUCOSE 77  BUN 14  CREATININE 0.92  CALCIUM 9.4    Studies/Results: No results found.  Medications: I have reviewed the patient's current medications.  Assessment/Plan: Plan to keep him n.p.o.  Will start on IV thyroid replacement for hypothyroidism.  We will continue IV support.  Awaiting gastrostomy tube placement.  LOS: 1 day   Izora Gala 08/20/2018, 8:50 AM

## 2018-08-20 NOTE — Consult Note (Signed)
Chief Complaint: Patient was seen in consultation today for percutaneous gastric tube placement at the request of Dr Elie Goody   Supervising Physician: Marybelle Killings  Patient Status: [facility] - In-pt  History of Present Illness: Brandon Hall is a 62 y.o. male   Laryngeal Ca Post radiation therapy Persistent tumor Dysphagia; malnutrition Deconditioning; wt loss Request for percutaneous gastric tube placement  Dr Barbie Banner has reviewed imaging and approves procedure   Past Medical History:  Diagnosis Date  . Arthritis   . Blindness of right eye   . Cancer ([place])    pharyngeal ca  . Cataract, secondary obscuring vision    LEFT  . Graves disease   . History of radiation therapy 11/2014   [id number] squamous cell carcinoma of L neck, Stage IVB, unknown primary 70 Gy in 35 fractions treated at [facility], finished 12/25/14  . History of shingles    RASH  02-03-2014  PER PCP RESOLVING  . Hypertension    NO MEDS ,  MONITORED BY PCP  . Hypothyroidism, postradioiodine therapy   . Prostate cancer ([place])    DX  SEVERAL  YRS   AGO  WITH RADIATION THERAPY  . Squamous cell carcinoma of head and neck ([place]) 07/12/2014  . 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 COLONOSCOPY  10/20/2011   Procedure: COLONOSCOPY;  Surgeon: Daneil Dolin, MD;  [facility];  Service: Endoscopy;  Laterality: N/A;  1:00 pm  . COLONOSCOPY N/A 02/03/2018   Procedure: COLONOSCOPY;  Surgeon: Daneil Dolin, MD;  Location[facility];  Service: Endoscopy;  Laterality: N/A;  2:15  . CRYOABLATION N/A 04/07/2014   Procedure: SALVAGE CRYO ABLATION PROSTATE;  Surgeon: Lowella Bandy, MD;  Location: [facility];  Service: Urology;  Laterality: N/A;  . IR GENERIC HISTORICAL  03/27/2017   IR REMOVAL TUN ACCESS W/ PORT W/O FL MOD SED [facility]-INTERV RAD  . LARYNGOSCOPY    . LYMPH NODE BIOPSY      Allergies: Patient has no known  allergies.  Medications: Prior to Admission medications   Medication Sig Start Date End Date Taking? Authorizing Provider  cephALEXin (KEFLEX) 500 MG capsule Take 1 capsule (500 mg total) by mouth 4 (four) times daily. 05/12/18   Dettinger, Fransisca Kaufmann, MD  hydrochlorothiazide (HYDRODIURIL) 12.5 MG tablet Take 1 tablet (12.5 mg total) by mouth daily. 04/27/18   Dettinger, Fransisca Kaufmann, MD  levothyroxine (SYNTHROID, LEVOTHROID) 100 MCG tablet Take 1 tablet (100 mcg total) by mouth daily. 04/27/18   Dettinger, Fransisca Kaufmann, MD  pravastatin (PRAVACHOL) 40 MG tablet Take 1 tablet (40 mg total) by mouth daily. 04/27/18   Dettinger, Fransisca Kaufmann, MD  Vitamin D, Ergocalciferol, (DRISDOL) 50000 units CAPS capsule TAKE 1 CAPSULE EVERY 7 DAYS. 04/27/18   Dettinger, Fransisca Kaufmann, MD     Family History  Problem Relation Age of Onset  . Colon cancer Father   . Hypertension Mother   . Colon cancer Brother   . Colon cancer Brother     Social History   Socioeconomic History  . Marital status: Married    Spouse name: Not on file  . Number of children: Not on file  . Years of education: Not on file  . Highest education level: Not on file  Occupational History  . Occupation: disabled  Social Needs  . Financial resource strain: Not very hard  . Food insecurity:    Worry: Never  true    Inability: Never true  . Transportation needs:    Medical: No    Non-medical: No  Tobacco Use  . Smoking status: Former Smoker    Packs/day: 1.00    Years: 40.00    Pack years: 40.00    Types: Cigarettes    Last attempt to quit: 09/29/2013    Years since quitting: 4.8  . Smokeless tobacco: Never Used  Substance and Sexual Activity  . Alcohol use: No  . Drug use: No  . Sexual activity: Not on file  Lifestyle  . Physical activity:    Days per week: 7 days    Minutes per session: 60 min  . Stress: Only a little  Relationships  . Social connections:    Talks on phone: More than three times a week    Gets together: More than  three times a week    Attends religious service: More than 4 times per year    Active member of club or organization: Yes    Attends meetings of clubs or organizations: More than 4 times per year    Relationship status: Married  Other Topics Concern  . Not on file  Social History Narrative   Lives with wife in an apartment. He has stepchildren. They walk most everywhere they go locally but have access to transportation if needed.    Review of Systems: A 12 point ROS discussed and pertinent positives are indicated in the HPI above.  All other systems are negative.  Review of Systems  Constitutional: Positive for activity change, appetite change, fatigue and unexpected weight change. Negative for fever.  Respiratory: Negative for shortness of breath.   Cardiovascular: Negative for chest pain.  Gastrointestinal: Negative for abdominal pain.  Neurological: Positive for weakness.  Psychiatric/Behavioral: Negative for behavioral problems and confusion.    Vital Signs: BP (!) 130/92 (BP Location: Left Arm)   Pulse 100   Temp 98.1 F (36.7 C) (Oral)   Resp 18   SpO2 98%   Physical Exam  Constitutional: He is oriented to person, place, and time.  Thin and ill apperaing  Cardiovascular: Normal rate and regular rhythm.  Pulmonary/Chest: Effort normal and breath sounds normal.  Abdominal: Soft. Bowel sounds are normal.  Musculoskeletal: Normal range of motion.  Neurological: He is alert and oriented to person, place, and time.  Skin: Skin is warm and dry.  Psychiatric: He has a normal mood and affect. His behavior is normal. Judgment and thought content normal.  Pt is partially blind Wife at bedside signed consent  Vitals reviewed.   Imaging: Nm Pet Image Restag (ps) Skull Base To Thigh  Result Date: 08/07/2018 CLINICAL DATA:  Subsequent treatment strategy for metastatic squamous cell carcinoma of the head and neck. New laryngeal mass. EXAM: NUCLEAR MEDICINE PET SKULL BASE TO  THIGH TECHNIQUE: 6.63 mCi F-18 FDG was injected intravenously. Full-ring PET imaging was performed from the skull base to thigh after the radiotracer. CT data was obtained and used for attenuation correction and anatomic localization. Fasting blood glucose: 93 mg/dl COMPARISON:  CT neck and chest 06/10/2018. Abdominopelvic CT 04/21/2018. PET-CT 04/25/2015. FINDINGS: Mediastinal blood pool activity: SUV max 2.4 NECK: The recently demonstrated large recurrent right supraglottic mass is markedly hypermetabolic with an SUV max of 42.7. No other lesions of the pharyngeal mucosal space are identified. There is a single hypermetabolic level 2 node on the right which measures approximately 8 mm in diameter and has an SUV max of 10.9. No other hypermetabolic  cervical lymph nodes are seen. Incidental CT findings: Bilateral carotid atherosclerosis. CHEST: There is low-level hypermetabolic hilar activity bilaterally, likely reactive. This appears similar to the previous study. No suspicious pulmonary activity. Incidental CT findings: Atherosclerosis of the aorta, great vessels and coronary arteries. Moderate centrilobular and paraseptal emphysema. 6 mm right middle lobe nodule on image 54/8 is stable, without hypermetabolic activity. ABDOMEN/PELVIS: There is no hypermetabolic activity within the liver, adrenal glands, spleen or pancreas. There is no hypermetabolic nodal activity. Asymmetric physiologic ureteral activity noted bilaterally. Incidental CT findings: Bilateral renal cysts, probable hemangioma in the left hepatic lobe (image 122/5) and aortic atherosclerosis noted. SKELETON: There is no hypermetabolic activity to suggest osseous metastatic disease. Incidental CT findings: none IMPRESSION: 1. Large recurrent hypermetabolic right supraglottic mass with single hypermetabolic level 2 lymph node on the right. 2. No evidence of metastatic disease or synchronous primary in the chest, abdomen or pelvis. 3. Aortic  Atherosclerosis (ICD10-I70.0) and Emphysema (ICD10-J43.9). Electronically Signed   By: Richardean Sale M.D.   On: 08/07/2018 09:18    Labs:  CBC: Recent Labs    09/17/17 1349 03/10/18 1257 08/19/18 2147  WBC 3.9* 4.0 7.1  HGB 14.3 14.5 13.1  HCT 44.3 46.8 43.5  PLT 152 161 279    COAGS: Recent Labs    08/19/18 2147 08/20/18 0621  INR  --  1.17  APTT 31  --     BMP: Recent Labs    09/17/17 1349 10/26/17 1212 03/10/18 1257 04/21/18 1622 06/10/18 1501 08/19/18 2147  NA 137 141 136  --   --  136  K 3.3* 4.1 3.7  --   --  3.1*  CL 100* 96 101  --   --  95*  CO2 27 25 27   --   --  29  GLUCOSE 108* 70 118*  --   --  77  BUN 16 14 18   --   --  14  CALCIUM 9.3 9.7 9.5  --   --  9.4  CREATININE 0.99 1.15 0.96 1.00 1.10 0.92  GFRNONAA >60 68 >60  --   --  >60  GFRAA >60 78 >60  --   --  >60    LIVER FUNCTION TESTS: Recent Labs    09/17/17 1349 10/26/17 1212 03/10/18 1257 08/19/18 2147  BILITOT 0.4 0.7 0.6 0.8  AST 21 28 21 23   ALT 12* 19 17 12   ALKPHOS 54 67 61 57  PROT 6.7 7.2 7.1 7.1  ALBUMIN 3.9 4.7 4.1 3.5    TUMOR MARKERS: No results for input(s): AFPTM, CEA, CA199, CHROMGRNA in the last 8760 hours.  Assessment and Plan:  Laryngeal cancer Dysphagia Malnutrition Scheduled for percutaneous gastric tube placement Risks and benefits discussed with the patient including, but not limited to the need for a barium enema during the procedure, bleeding, infection, peritonitis, or damage to adjacent structures.  All of the patient's questions were answered, patient is agreeable to proceed. Consent signed and in chart.   Thank you for this interesting consult.  I greatly enjoyed meeting Adryen Cookson and look forward to participating in their care.  A copy of this report was sent to the requesting provider on this date.  Electronically Signed: Lavonia Drafts, PA-C 08/20/2018, 9:12 AM   I spent a total of 40 Minutes    in face to face in clinical  consultation, greater than 50% of which was counseling/coordinating care for percutaneous gastric tube placement

## 2018-08-20 NOTE — Progress Notes (Addendum)
Initial Nutrition Assessment  DOCUMENTATION CODES:   Severe malnutrition in context of chronic illness  INTERVENTION:    Once G-tube placed, recommend:  Initiate Jevity 1.2 formula at 25 ml/hr and increase by 10 ml every 12 hours to goal rate of 55 ml/hr  Prostat liquid protein 30 ml daily via tube  Total provides 1684 kcals, 88 gm protein, 1065 ml free water daily   Also recommend free water flushes at 200 ml every 8 hours to meet fluid needs  NUTRITION DIAGNOSIS:   Severe Malnutrition related to cancer and cancer related treatments(laryngeal cancer) as evidenced by severe fat depletion, severe muscle depletion and 13% weight loss x 1 month  GOAL:   Patient will meet greater than or equal to 90% of their needs  MONITOR:   TF tolerance, Labs, Skin, Weight trends, I & O's  REASON FOR ASSESSMENT:   Malnutrition Screening Tool  ASSESSMENT:   62 yo Male with history of laryngeal cancer, status post radiation therapy persistent tumor. Planning total laryngectomy in the near future. Admitted for G-tube placement for nutrition support.  RD briefly spoke with pt's wife at bedside.  Pt's wife asked RD to speak with [facility] who is pt's caregiver. RD spoke with [name] via phone.  [name] reports pt was unable to tolerate any solids bc he would get "choked up". PO intake has mainly been from Boost and/or Ensure supplements. Per [place], pt would drink 3-4 nutrition supplements per day.  Brayton Layman also reveals pt has had severe weight loss of 20 lbs in the last month. Labs and medications reviewed. K 3.1 (L). Plan for G-tube per IR.  NUTRITION - FOCUSED PHYSICAL EXAM:    Most Recent Value  Orbital Region  Severe depletion  Upper Arm Region  Severe depletion  Thoracic and Lumbar Region  Unable to assess  Buccal Region  Severe depletion  Temple Region  Severe depletion  Clavicle Bone Region  Severe depletion  Clavicle and Acromion Bone Region  Severe depletion  Scapular Bone  Region  Unable to assess  Dorsal Hand  Severe depletion  Patellar Region  Severe depletion  Anterior Thigh Region  Severe depletion  Posterior Calf Region  Severe depletion  Edema (RD Assessment)  None     Diet Order:   Diet Order            Diet NPO time specified  Diet effective now             EDUCATION NEEDS:   Not appropriate for education at this time  Skin:  Skin Assessment: Reviewed RN Assessment  Last BM:  8/22  Height:   Ht Readings from Last 1 Encounters:  08/20/18 5\' 6"  (1.676 m)   Weight:  Wt Readings from Last 1 Encounters:  08/20/18 60 kg   BMI:  Body mass index is 21.35 kg/m.  Estimated Nutritional Needs:   Kcal:  1550-1700  Protein:  85-95 gm  Fluid:  1.5-1.7 L/day  Arthur Holms, RD, LDN Pager #: [phone] After-Hours Pager #: [phone]

## 2018-08-21 ENCOUNTER — Other Ambulatory Visit: Payer: Self-pay

## 2018-08-21 LAB — MAGNESIUM: Magnesium: 2 mg/dL (ref 1.7–2.4)

## 2018-08-21 LAB — POTASSIUM: Potassium: 3.4 mmol/L — ABNORMAL LOW (ref 3.5–5.1)

## 2018-08-21 LAB — PHOSPHORUS: PHOSPHORUS: 1.6 mg/dL — AB (ref 2.5–4.6)

## 2018-08-21 LAB — GLUCOSE, CAPILLARY: Glucose-Capillary: 169 mg/dL — ABNORMAL HIGH (ref 70–99)

## 2018-08-21 MED ORDER — JEVITY 1.2 CAL PO LIQD
1000.0000 mL | ORAL | Status: AC
  Administered 2018-08-21: 15 mL
  Administered 2018-08-22 – 2018-08-23 (×2): 1000 mL
  Filled 2018-08-21 (×4): qty 1000

## 2018-08-21 MED ORDER — FREE WATER
200.0000 mL | Freq: Three times a day (TID) | Status: AC
  Administered 2018-08-21 – 2018-08-23 (×8): 200 mL

## 2018-08-21 NOTE — Progress Notes (Signed)
   ENT Progress Note: Patient status post gastrostomy tube placement   Subjective: Pain stable, patient tolerating limited oral intake.  Objective: Vital signs in last 24 hours: Temp:  [97.6 F (36.4 C)-98.6 F (37 C)] 98.3 F (36.8 C) (08/24 0509) Pulse Rate:  [70-95] 95 (08/24 0509) Resp:  [15-19] 19 (08/24 0509) BP: (83-155)/(66-114) 106/72 (08/24 0509) SpO2:  [89 %-100 %] 94 % (08/24 0509) Weight:  [60 kg] 60 kg (08/23 1417) Weight change:  Last BM Date: 08/19/18  Intake/Output from previous day: 08/23 0701 - 08/24 0700 In: 1794 [I.V.:1794] Out: 450 [Urine:450] Intake/Output this shift: Total I/O In: -  Out: 100 [Urine:100]  Labs: Recent Labs    08/19/18 2147  WBC 7.1  HGB 13.1  HCT 43.5  PLT 279   Recent Labs    08/19/18 2147  NA 136  K 3.1*  CL 95*  CO2 29  GLUCOSE 77  BUN 14  CALCIUM 9.4    Studies/Results: Ir Gastrostomy Tube Mod Sed  Result Date: 08/20/2018 INDICATION: Supraglottic squamous cell carcinoma EXAM: PERC PLACEMENT GASTROSTOMY MEDICATIONS: Ancef 2 g; Antibiotics were administered within 1 hour of the procedure. Glucagon 1 mg IV ANESTHESIA/SEDATION: Versed 2 mg IV; Fentanyl 100 mcg IV Moderate Sedation Time:  24 minutes The patient was continuously monitored during the procedure by the interventional radiology nurse under my direct supervision. CONTRAST:  10 cc Isovue-300-administered into the gastric lumen. FLUOROSCOPY TIME:  Fluoroscopy Time: 2 minutes 30 seconds (2 mGy). COMPLICATIONS: None immediate. PROCEDURE: The procedure, risks, benefits, and alternatives were explained to the patient. Questions regarding the procedure were encouraged and answered. The patient understands and consents to the procedure. The epigastrium was prepped with Betadine in a sterile fashion, and a sterile drape was applied covering the operative field. A sterile gown and sterile gloves were used for the procedure. A 5-French orogastric tube is placed under  fluoroscopic guidance. Scout imaging of the abdomen confirms barium within the transverse colon. The stomach was distended with gas. Under fluoroscopic guidance, an 18 gauge needle was utilized to puncture the anterior wall of the body of the stomach. An Amplatz wire was advanced through the needle passing a T fastener into the lumen of the stomach. The T fastener was secured for gastropexy. A 9-French sheath was inserted. A snare was advanced through the 9-French sheath. A Britta Mccreedy was advanced through the orogastric tube. It was snared then pulled out the oral cavity, pulling the snare, as well. The leading edge of the gastrostomy was attached to the snare. It was then pulled down the esophagus and out the percutaneous site. It was secured in place. Contrast was injected. The image demonstrates placement of a 20-French pull-through type gastrostomy tube into the body of the stomach. IMPRESSION: Successful 20 French pull-through gastrostomy. Electronically Signed   By: Marybelle Killings M.D.   On: 08/20/2018 16:08     PHYSICAL EXAM: Upper airway stable, no compromise.  Patient tolerating soft oral diet. Gastrostomy tube site stable, no evidence of bleeding or discharge.   Assessment/Plan: Patient stable after placement of gastrostomy tube for nutrition. Plan nutrition consult for management of severe cachexia.  Patient may continue oral diet as tolerated but supplement with his gastrostomy tube.  Plan discharge when stable and nutrition plan in place.  Patient will require definitive laryngectomy when nutrition status has improved.    [place], Laiylah Roettger 08/21/2018, 9:43 AM

## 2018-08-21 NOTE — Progress Notes (Addendum)
Nutrition Consult/Brief Note  Initial nutrition assessment completed 8/23. S/p G-tube placement 8/24. He is currently on Clear Liquids. Recommend initiation of Jevity 1.2 formula at 15 ml/hr and increase by 10 ml every 12 hours to goal rate of 55 ml/hr. Prostat liquid protein 30 ml daily via tube. Total provides 1684 kcals, 88 gm protein, 1065 ml free water daily. Free water flushes at 200 ml every 8 hours. Discussed nutrition care plan with Dr. Wilburn Cornelia. Spoke with RN. Will continue to follow.  Monitor magnesium, potassium, and phosphorus daily for at least 3 days, MD to replete as needed, as pt is at risk for refeeding syndrome given severe malnutrition.  Estimated Nutritional Needs:   Kcal:  1550-1700  Protein:  85-95 gm  Fluid:  1.5-1.7 L/day  Arthur Holms, RD, LDN Pager #: [phone] After-Hours Pager #: [phone]

## 2018-08-21 NOTE — Progress Notes (Signed)
Referring Physician(s): Elie Goody  Supervising Physician: Marybelle Killings  Patient Status:  [facility] - In-pt  Chief Complaint:  Dysphagia Laryngeal cancer  Subjective:  Percutaneous gastric tube placement 8/23 Up in chair today Sipping some broth  Site of G tube was painful last night-- much better this am  Allergies: Patient has no known allergies.  Medications: Prior to Admission medications   Medication Sig Start Date End Date Taking? Authorizing Provider  cephALEXin (KEFLEX) 500 MG capsule Take 1 capsule (500 mg total) by mouth 4 (four) times daily. 05/12/18   Dettinger, Fransisca Kaufmann, MD  hydrochlorothiazide (HYDRODIURIL) 12.5 MG tablet Take 1 tablet (12.5 mg total) by mouth daily. 04/27/18   Dettinger, Fransisca Kaufmann, MD  levothyroxine (SYNTHROID, LEVOTHROID) 100 MCG tablet Take 1 tablet (100 mcg total) by mouth daily. 04/27/18   Dettinger, Fransisca Kaufmann, MD  pravastatin (PRAVACHOL) 40 MG tablet Take 1 tablet (40 mg total) by mouth daily. 04/27/18   Dettinger, Fransisca Kaufmann, MD  Vitamin D, Ergocalciferol, (DRISDOL) 50000 units CAPS capsule TAKE 1 CAPSULE EVERY 7 DAYS. 04/27/18   Dettinger, Fransisca Kaufmann, MD     Vital Signs: BP 106/72 (BP Location: Left Arm)   Pulse 95   Temp 98.3 F (36.8 C) (Oral)   Resp 19   Ht 5\' 6"  (1.676 m)   Wt 132 lb 4.4 oz (60 kg)   SpO2 94%   BMI 21.35 kg/m   Physical Exam  Constitutional: He is oriented to person, place, and time.  Abdominal: Soft. Bowel sounds are normal. There is no tenderness.  Neurological: He is alert and oriented to person, place, and time.  Skin: Skin is warm and dry.  Site is clean and dry Minimally tender to touch No redness No bleeding   Vitals reviewed.   Imaging: Ir Gastrostomy Tube Mod Sed  Result Date: 08/20/2018 INDICATION: Supraglottic squamous cell carcinoma EXAM: PERC PLACEMENT GASTROSTOMY MEDICATIONS: Ancef 2 g; Antibiotics were administered within 1 hour of the procedure. Glucagon 1 mg IV ANESTHESIA/SEDATION: Versed 2 mg  IV; Fentanyl 100 mcg IV Moderate Sedation Time:  24 minutes The patient was continuously monitored during the procedure by the interventional radiology nurse under my direct supervision. CONTRAST:  10 cc Isovue-300-administered into the gastric lumen. FLUOROSCOPY TIME:  Fluoroscopy Time: 2 minutes 30 seconds (2 mGy). COMPLICATIONS: None immediate. PROCEDURE: The procedure, risks, benefits, and alternatives were explained to the patient. Questions regarding the procedure were encouraged and answered. The patient understands and consents to the procedure. The epigastrium was prepped with Betadine in a sterile fashion, and a sterile drape was applied covering the operative field. A sterile gown and sterile gloves were used for the procedure. A 5-French orogastric tube is placed under fluoroscopic guidance. Scout imaging of the abdomen confirms barium within the transverse colon. The stomach was distended with gas. Under fluoroscopic guidance, an 18 gauge needle was utilized to puncture the anterior wall of the body of the stomach. An Amplatz wire was advanced through the needle passing a T fastener into the lumen of the stomach. The T fastener was secured for gastropexy. A 9-French sheath was inserted. A snare was advanced through the 9-French sheath. A Britta Mccreedy was advanced through the orogastric tube. It was snared then pulled out the oral cavity, pulling the snare, as well. The leading edge of the gastrostomy was attached to the snare. It was then pulled down the esophagus and out the percutaneous site. It was secured in place. Contrast was injected. The image demonstrates placement  of a 20-French pull-through type gastrostomy tube into the body of the stomach. IMPRESSION: Successful 20 French pull-through gastrostomy. Electronically Signed   By: Marybelle Killings M.D.   On: 08/20/2018 16:08    Labs:  CBC: Recent Labs    09/17/17 1349 03/10/18 1257 08/19/18 2147  WBC 3.9* 4.0 7.1  HGB 14.3 14.5 13.1  HCT 44.3  46.8 43.5  PLT 152 161 279    COAGS: Recent Labs    08/19/18 2147 08/20/18 0621  INR  --  1.17  APTT 31  --     BMP: Recent Labs    09/17/17 1349 10/26/17 1212 03/10/18 1257 04/21/18 1622 06/10/18 1501 08/19/18 2147  NA 137 141 136  --   --  136  K 3.3* 4.1 3.7  --   --  3.1*  CL 100* 96 101  --   --  95*  CO2 27 25 27   --   --  29  GLUCOSE 108* 70 118*  --   --  77  BUN 16 14 18   --   --  14  CALCIUM 9.3 9.7 9.5  --   --  9.4  CREATININE 0.99 1.15 0.96 1.00 1.10 0.92  GFRNONAA >60 68 >60  --   --  >60  GFRAA >60 78 >60  --   --  >60    LIVER FUNCTION TESTS: Recent Labs    09/17/17 1349 10/26/17 1212 03/10/18 1257 08/19/18 2147  BILITOT 0.4 0.7 0.6 0.8  AST 21 28 21 23   ALT 12* 19 17 12   ALKPHOS 54 67 61 57  PROT 6.7 7.2 7.1 7.1  ALBUMIN 3.9 4.7 4.1 3.5    Assessment and Plan:  G tube placed in IR 8/23 May use now  Electronically Signed: Ezell Poke A, PA-C 08/21/2018, 9:01 AM   I spent a total of 15 Minutes at the the patient's bedside AND on the patient's hospital floor or unit, greater than 50% of which was counseling/coordinating care for percutaneous gastric tube placement

## 2018-08-22 ENCOUNTER — Encounter (HOSPITAL_COMMUNITY): Payer: Self-pay

## 2018-08-22 LAB — GLUCOSE, CAPILLARY
GLUCOSE-CAPILLARY: 121 mg/dL — AB (ref 70–99)
GLUCOSE-CAPILLARY: 107 mg/dL — AB (ref 70–99)
Glucose-Capillary: 102 mg/dL — ABNORMAL HIGH (ref 70–99)
Glucose-Capillary: 106 mg/dL — ABNORMAL HIGH (ref 70–99)
Glucose-Capillary: 114 mg/dL — ABNORMAL HIGH (ref 70–99)
Glucose-Capillary: 115 mg/dL — ABNORMAL HIGH (ref 70–99)

## 2018-08-22 NOTE — Progress Notes (Signed)
   ENT Progress Note: HD #3, s/p G-tube placement   Subjective: Stable, tol limited po  Objective: Vital signs in last 24 hours: Temp:  [97.5 F (36.4 C)-98.8 F (37.1 C)] 97.8 F (36.6 C) (08/25 0422) Pulse Rate:  [76-105] 76 (08/25 0422) Resp:  [18] 18 (08/25 0422) BP: (112-123)/(68-86) 122/68 (08/25 0422) SpO2:  [98 %-99 %] 99 % (08/25 0422) Weight change:  Last BM Date: 08/20/18  Intake/Output from previous day: 08/24 0701 - 08/25 0700 In: 3316.8 [P.O.:1390; I.V.:1748.8; NG/GT:178] Out: 1300 [Urine:1300] Intake/Output this shift: Total I/O In: 300 [P.O.:300] Out: 425 [Urine:425]  Labs: Recent Labs    08/19/18 2147  WBC 7.1  HGB 13.1  HCT 43.5  PLT 279   Recent Labs    08/19/18 2147 08/21/18 1424  NA 136  --   K 3.1* 3.4*  CL 95*  --   CO2 29  --   GLUCOSE 77  --   BUN 14  --   CALCIUM 9.4  --     Studies/Results: Ir Gastrostomy Tube Mod Sed  Result Date: 08/20/2018 INDICATION: Supraglottic squamous cell carcinoma EXAM: PERC PLACEMENT GASTROSTOMY MEDICATIONS: Ancef 2 g; Antibiotics were administered within 1 hour of the procedure. Glucagon 1 mg IV ANESTHESIA/SEDATION: Versed 2 mg IV; Fentanyl 100 mcg IV Moderate Sedation Time:  24 minutes The patient was continuously monitored during the procedure by the interventional radiology nurse under my direct supervision. CONTRAST:  10 cc Isovue-300-administered into the gastric lumen. FLUOROSCOPY TIME:  Fluoroscopy Time: 2 minutes 30 seconds (2 mGy). COMPLICATIONS: None immediate. PROCEDURE: The procedure, risks, benefits, and alternatives were explained to the patient. Questions regarding the procedure were encouraged and answered. The patient understands and consents to the procedure. The epigastrium was prepped with Betadine in a sterile fashion, and a sterile drape was applied covering the operative field. A sterile gown and sterile gloves were used for the procedure. A 5-French orogastric tube is placed under  fluoroscopic guidance. Scout imaging of the abdomen confirms barium within the transverse colon. The stomach was distended with gas. Under fluoroscopic guidance, an 18 gauge needle was utilized to puncture the anterior wall of the body of the stomach. An Amplatz wire was advanced through the needle passing a T fastener into the lumen of the stomach. The T fastener was secured for gastropexy. A 9-French sheath was inserted. A snare was advanced through the 9-French sheath. A Britta Mccreedy was advanced through the orogastric tube. It was snared then pulled out the oral cavity, pulling the snare, as well. The leading edge of the gastrostomy was attached to the snare. It was then pulled down the esophagus and out the percutaneous site. It was secured in place. Contrast was injected. The image demonstrates placement of a 20-French pull-through type gastrostomy tube into the body of the stomach. IMPRESSION: Successful 20 French pull-through gastrostomy. Electronically Signed   By: Marybelle Killings M.D.   On: 08/20/2018 16:08     PHYSICAL EXAM: Stable airway G-tube stable   Assessment/Plan: Appreciate nutrition service input.  Patient tolerating initial gastrostomy tube feedings.  Plan to advance her nutrition recommendations.  If patient tolerates continuous feed, would consider bolus feeding prior to discharge.  Plan follow-up through home health care for nutrition and f/u.  Plan discussed with patient and wife, plan for discharge over the next several days when tube feeding stable.    [place], Shevelle Smither 08/22/2018, 9:26 AM

## 2018-08-23 ENCOUNTER — Encounter: Payer: Self-pay | Admitting: *Deleted

## 2018-08-23 LAB — BASIC METABOLIC PANEL
Anion gap: 9 (ref 5–15)
CHLORIDE: 99 mmol/L (ref 98–111)
CO2: 30 mmol/L (ref 22–32)
CREATININE: 0.73 mg/dL (ref 0.61–1.24)
Calcium: 9.4 mg/dL (ref 8.9–10.3)
GFR calc Af Amer: >60 mL/min (ref >60)
GLUCOSE: 79 mg/dL (ref 70–99)
POTASSIUM: 4 mmol/L (ref 3.5–5.1)
SODIUM: 138 mmol/L (ref 135–145)

## 2018-08-23 LAB — GLUCOSE, CAPILLARY
GLUCOSE-CAPILLARY: 98 mg/dL (ref 70–99)
GLUCOSE-CAPILLARY: 96 mg/dL (ref 70–99)
GLUCOSE-CAPILLARY: 77 mg/dL (ref 70–99)
Glucose-Capillary: 101 mg/dL — ABNORMAL HIGH (ref 70–99)
Glucose-Capillary: 115 mg/dL — ABNORMAL HIGH (ref 70–99)
Glucose-Capillary: 84 mg/dL (ref 70–99)

## 2018-08-23 LAB — PHOSPHORUS: Phosphorus: 2.4 mg/dL — ABNORMAL LOW (ref 2.5–4.6)

## 2018-08-23 LAB — MAGNESIUM: MAGNESIUM: 1.9 mg/dL (ref 1.7–2.4)

## 2018-08-23 MED ORDER — FREE WATER
100.0000 mL | Freq: Four times a day (QID) | Status: DC
  Administered 2018-08-24 (×2): 100 mL

## 2018-08-23 MED ORDER — PRO-STAT SUGAR FREE PO LIQD
30.0000 mL | Freq: Every day | ORAL | Status: DC
  Administered 2018-08-24: 30 mL via ORAL
  Filled 2018-08-23: qty 30

## 2018-08-23 MED ORDER — LEVOTHYROXINE SODIUM 100 MCG PO TABS
100.0000 ug | ORAL_TABLET | Freq: Every day | ORAL | Status: DC
  Administered 2018-08-24: 100 ug
  Filled 2018-08-23: qty 1

## 2018-08-23 MED ORDER — ACETAMINOPHEN 160 MG/5ML PO SOLN: 500.0000 mg | Freq: Four times a day (QID) | ORAL | Status: DC | PRN

## 2018-08-23 MED ORDER — IBUPROFEN 100 MG/5ML PO SUSP
400.0000 mg | Freq: Four times a day (QID) | ORAL | Status: DC
  Administered 2018-08-23 – 2018-08-24 (×4): 400 mg
  Filled 2018-08-23 (×6): qty 20

## 2018-08-23 MED ORDER — JEVITY 1.5 CAL/FIBER PO LIQD
325.0000 mL | Freq: Four times a day (QID) | ORAL | Status: DC
  Administered 2018-08-24 (×2): 325 mL
  Filled 2018-08-23 (×4): qty 1000

## 2018-08-23 NOTE — Progress Notes (Addendum)
Nutrition Follow-up  DOCUMENTATION CODES:   Severe malnutrition in context of chronic illness  INTERVENTION:   -D/c continuous TF at 0000 (08/24/18) -Transition to bolus feedings:  Initiate 325 ml Jevity 1.5 via g-tube QID (start with 175 ml feed and titrate additional 50 ml at each subsequent feed until goal rate of 325 ml)  30 ml Prostat (or equivalent) daily.    100 ml flush QID (50 ml before and after each feeding administration)  Tube feeding regimen provides 2050 kcal (100% of needs), 98 grams of protein, and 1388 ml of H2O.    NUTRITION DIAGNOSIS:   Severe Malnutrition related to cancer and cancer related treatments(laryngeal cancer) as evidenced by severe fat depletion, severe muscle depletion.  Ongoing  GOAL:   Patient will meet greater than or equal to 90% of their needs  Progressing  MONITOR:   TF tolerance, Labs, Skin, Weight trends, I & O's  REASON FOR ASSESSMENT:   Consult Enteral/tube feeding initiation and management  ASSESSMENT:   62 yo Male with history of laryngeal cancer, status post radiation therapy persistent tumor. Planning total laryngectomy in the near future. Admitted for G-tube placement for nutrition support.  8/23- s/p G-tube placement 8/24- TF initiated  RD received consult from [facility] for home tube feeding recommendations. Per ENT notes, plan to transition pt to bolus feedings.   Pt is consuming small amounts of clear liquids (PO: 0-50%). Jevity 1.2 infusing via G-tube at 35 ml/hr (also receiving 200 ml free water flush every 8 hours), which is providing 1008 kcals, 47 grams protein, and 1278 ml free water daily (meeting 53% of estimated kcal needs and 49% of estimated protein needs).   Labs reviewed: K and Phos WDL. Mg: 2.4. CBGS: 84-115.  Diet Order:   Diet Order            Diet clear liquid Room service appropriate? Yes; Fluid consistency: Thin  Diet effective now              EDUCATION NEEDS:   Not appropriate for  education at this time  Skin:  Skin Assessment: Reviewed RN Assessment  Last BM:  08/20/18  Height:   Ht Readings from Last 1 Encounters:  08/22/18 5\' 9"  (1.753 m)    Weight:   Wt Readings from Last 1 Encounters:  08/23/18 63.6 kg    Ideal Body Weight:  72.7 kg  BMI:  Body mass index is 20.71 kg/m.  Estimated Nutritional Needs:   Kcal:  1900-2100  Protein:  95-110 grams  Fluid:  1.9-2.1 L    Sharnee Douglass A. Jimmye Norman, RD, LDN, CDE Pager: [phone] After hours Pager: [phone]

## 2018-08-23 NOTE — Consult Note (Signed)
[facility] CM Primary Care Navigator  08/23/2018  Maninder Popelka Sep 19, 1956 [id number]   Seen patient and wife ([name])at the bedside to identify possible discharge needs. Patient reportsbeing directed by provider for evaluation of tumor to throat. Per MD note, patient was admitted to the hospital for intravenous fluid support, gastrostomy tube placement in anticipation of total laryngectomy in the near future. (Laryngeal Ca, dysphagia, malnutrition, deconditioning, weight loss, for percutaneous gastric tube placement)  Patient endorsesDr. Ivin Booty Dettinger with [facility] as the primary care provider.   [facility] [facility] [facility] [employer] [facility] to obtain medications without any problem.   Patientstatesthat wife is managing his medications at homestraight out of the containers.  Patient's cousin ([name])has been providing transportation to hisdoctors' appointments. Patient states that they live very close to primary care provider's office, that they can just walk to the office.   Mentioned [employer] transportation benefits to use when needed.  According to patient,wife and cousin can assist with his care at home but will be needing assistance when discharge.  Anticipated discharge plan ishome with home health services per Inpatient CM note.  Patientand wifevoiced understanding to call primary care provider's office for a post discharge follow-up appointment within a1- 2 weeks orsooner if needs arise. Patient letter (with PCP's contact number) wasprovided as a reminder.  Discussed with patientand wiferegarding [facility] CM services available for health managementandresourcesat homebutbothdeniedany concerns or needs at thistime. Patientand wifeverbalizedunderstandingof needto seekreferral from primary care provider to [facility] care management ifdeemed necessary and appropriatefor  anyservicesin thefuture.  [facility] care management information was provided for futureneeds thatpatient may have.  Patienthad verbally agreedand optedforEMMIcalls tofollow-up with hisrecovery at home.   Referral made for [facility] calls after discharge.   For additional questions please contact:  Karin Golden A. Katheryne Gorr, BSN, RN-BC [facility] PRIMARY CARE Navigator Cell: [phone]

## 2018-08-23 NOTE — Progress Notes (Signed)
Patient ID: Brandon Hall, male   DOB: [birth year], 62 y.o.   MRN: [id number] Subjective: Doing well, appears much more energetic now than prior to admission.  Breathing clearly.  Objective: Vital signs in last 24 hours: Temp:  [97.6 F (36.4 C)-98.4 F (36.9 C)] 98.4 F (36.9 C) (08/26 0418) Pulse Rate:  [67-76] 69 (08/26 0418) Resp:  [17-18] 18 (08/26 0418) BP: (116-144)/(69-83) 144/83 (08/26 0418) SpO2:  [98 %-100 %] 100 % (08/26 0418) Weight:  [63.6 kg-64.5 kg] 63.6 kg (08/26 0152) Weight change:  Last BM Date: 08/20/18  Intake/Output from previous day: 08/25 0701 - 08/26 0700 In: 4755.2 [P.O.:949; I.V.:1875; NG/GT:1126.2] Out: 3670 [Urine:3670] Intake/Output this shift: Total I/O In: 570 [P.O.:570] Out: 500 [Urine:500]  PHYSICAL EXAM: He is awake and alert, sitting up.  He is talking and breathing well.  Lab Results: No results for input(s): WBC, HGB, HCT, PLT in the last 72 hours. BMET Recent Labs    08/21/18 1424  K 3.4*    Studies/Results: No results found.  Medications: I have reviewed the patient's current medications.  Assessment/Plan: Continue gastrostomy feeding.  Advance to bolus feeds.  We will stop the IV fluids.  We will work on home nursing assistance and will discharge home when he is ready.  LOS: 4 days   Izora Gala 08/23/2018, 9:58 AM

## 2018-08-24 ENCOUNTER — Ambulatory Visit: Payer: Self-pay | Admitting: Otolaryngology

## 2018-08-24 ENCOUNTER — Encounter: Payer: Self-pay | Admitting: *Deleted

## 2018-08-24 LAB — GLUCOSE, CAPILLARY
GLUCOSE-CAPILLARY: 72 mg/dL (ref 70–99)
GLUCOSE-CAPILLARY: 109 mg/dL — AB (ref 70–99)
Glucose-Capillary: 102 mg/dL — ABNORMAL HIGH (ref 70–99)
Glucose-Capillary: 73 mg/dL (ref 70–99)

## 2018-08-24 MED ORDER — PRO-STAT SUGAR FREE PO LIQD: 30.0000 mL | Freq: Every day | ORAL | 0 refills | Status: AC

## 2018-08-24 MED ORDER — LEVOTHYROXINE SODIUM 100 MCG PO TABS: 100.0000 ug | ORAL_TABLET | Freq: Every day | ORAL | 5 refills | Status: DC

## 2018-08-24 MED ORDER — JEVITY 1.5 CAL/FIBER PO LIQD: 325.0000 mL | Freq: Four times a day (QID) | ORAL | 0 refills | Status: AC

## 2018-08-24 NOTE — Progress Notes (Signed)
Demonstrated to the pt and his wife on how to check residuals and administer bolus feedings.  Also told the wife if the pt has severe nausea to hold the feeding for and hour till he feels better before administering the feeds.  Instructed pt and wife on how to change the dressing around the peg tube insertion site.  Both of them verbalizes understanding but the wife still insist that she needs a nurse to come out to the house to do the feeding.  Patient verbalizes he is ok to do it because it looked easy.

## 2018-08-24 NOTE — Care Management Important Message (Signed)
Important Message  Patient Details  Name: Brandon Hall MRN: [id number] Date of Birth: 27-Dec-1956   Medicare Important Message Given:  Yes    Orbie Pyo 08/24/2018, 12:04 PM

## 2018-08-24 NOTE — Progress Notes (Signed)
Told the pt and the wife that he is being discharged today.  They said they have a friend coming to pick them up.  I told them that home health has been notified and advance care will bring the tube feedings to the house.  Both verbalizes understanding.

## 2018-08-24 NOTE — Progress Notes (Signed)
Pt alert and oriented x4, no complaints of pain or discomfort.  Bed in low position, call bell within reach.  Bed alarms on and functioning.  Assessment done and charted.  Will continue to monitor and do hourly rounding throughout the shift 

## 2018-08-24 NOTE — Progress Notes (Signed)
Discharge: Pt d/c from room via wheelchair, Family member with the pt. Discharge instructions given to the patient and family members.  No questions from pt, reintegrated to call the home health nurse if they had any questions regarding the feedings. Pt dressed in street clothes and left with discharge papers and prescriptions in hand. IV d/ced, and no complaints of pain or discomfort.

## 2018-08-24 NOTE — Care Management Note (Signed)
Case Management Note  Patient Details  Name: Brandon Hall MRN: [id number] Date of Birth: 03/14/1956  Subjective/Objective:                    Action/Plan:  Bedside nurse to teach patient, wife and [place].  [facility] aware possible discharge today . Expected Discharge Date:                  Expected Discharge Plan:  [place]  In-House Referral:     Discharge planning Services  CM Consult  Post Acute Care Choice:  Durable Medical Equipment, Home Health Choice offered to:  Spouse, Patient  DME Arranged:  Tube feeding DME Agency:  [place] Arranged:  RN [facility] Agency:  [place]  Status of Service:  Completed, signed off  If discussed at [place] of Stay Meetings, dates discussed:    Additional Comments:  Marilu Favre, RN 08/24/2018, 11:05 AM

## 2018-08-24 NOTE — Discharge Summary (Signed)
Physician Discharge Summary  Patient ID: Brandon Hall MRN: [id number] DOB/AGE: 63-Sep-1957 63 y.o.  Admit date: 08/19/2018 Discharge date: 08/24/2018  Admission Diagnoses:laryngeal cancer, malnutrition  Discharge Diagnoses:  Active Problems:   Laryngeal cancer (HCC)   Protein-calorie malnutrition, severe   Discharged Condition: fair  Hospital Course: no complications  Consults: IR for Gastrostomy tube placement  Significant Diagnostic Studies: none  Treatments: surgery: gastrostomy placement  Discharge Exam: Blood pressure (!) 140/93, pulse 77, temperature 98.3 F (36.8 C), temperature source Oral, resp. rate 16, height 5\' 9"  (1.753 m), weight 52.4 kg, SpO2 100 %. PHYSICAL EXAM: Awake and alert, no breathing problems.  Disposition: Discharge disposition: 01-Home or Self Care       Discharge Instructions    Diet - low sodium heart healthy   Complete by:  As directed    Increase activity slowly   Complete by:  As directed      Allergies as of 08/24/2018   No Known Allergies     Medication List    STOP taking these medications   cephALEXin 500 MG capsule Commonly known as:  KEFLEX     TAKE these medications   feeding supplement (JEVITY 1.5 CAL/FIBER) Liqd Place 325 mLs into feeding tube 4 (four) times daily.   feeding supplement (PRO-STAT SUGAR FREE 64) Liqd Take 30 mLs by mouth daily. Start taking on:  08/25/2018   hydrochlorothiazide 12.5 MG tablet Commonly known as:  HYDRODIURIL Take 1 tablet (12.5 mg total) by mouth daily.   levothyroxine 100 MCG tablet Commonly known as:  SYNTHROID, LEVOTHROID Take 1 tablet (100 mcg total) by mouth daily. What changed:  Another medication with the same name was added. Make sure you understand how and when to take each.   levothyroxine 100 MCG tablet Commonly known as:  SYNTHROID, LEVOTHROID Place 1 tablet (100 mcg total) into feeding tube daily before breakfast. Start taking on:  08/25/2018 What changed:  You  were already taking a medication with the same name, and this prescription was added. Make sure you understand how and when to take each.   phenol 1.4 % Liqd Commonly known as:  CHLORASEPTIC Use as directed 1 spray in the mouth or throat as needed for throat irritation / pain.   polyethylene glycol packet Commonly known as:  MIRALAX / GLYCOLAX Take 17 g by mouth daily as needed for mild constipation or moderate constipation.   pravastatin 40 MG tablet Commonly known as:  PRAVACHOL Take 1 tablet (40 mg total) by mouth daily.   Vitamin D (Ergocalciferol) 50000 units Caps capsule Commonly known as:  DRISDOL TAKE 1 CAPSULE EVERY 7 DAYS. What changed:    how much to take  how to take this  when to take this  additional instructions            Durable Medical Equipment  (From admission, onward)         Start     Ordered   08/24/18 1056  For home use only DME Tube feeding  Once    Comments:  Severe malnutrition in context of chronic illness  INTERVENTION:  :  Jevity 1.5 via g-tube QID or equivalent 30 ml Prostat (or equivalent) daily.    100 ml flush QID (50 ml before and after each feeding administration)  Tube feeding regimen provides 2050 kcal (100% of needs), 98 grams of protein, and 1388 ml of H2O.    NUTRITION DIAGNOSIS:   Severe Malnutrition related to cancer and cancer related treatments(laryngeal cancer) as  evidenced by severe fat depletion, severe muscle depletion.  Ongoing  GOAL:   Patient will meet greater than or equal to 90% of their needs    Patient will need tube feedings greater then 90 days    08/24/18 1058           Signed: Serena Colonel 08/24/2018, 12:40 PM

## 2018-08-25 DIAGNOSIS — R1310 Dysphagia, unspecified: Secondary | ICD-10-CM | POA: Diagnosis not present

## 2018-08-25 DIAGNOSIS — H269 Unspecified cataract: Secondary | ICD-10-CM | POA: Diagnosis not present

## 2018-08-25 DIAGNOSIS — E43 Unspecified severe protein-calorie malnutrition: Secondary | ICD-10-CM | POA: Diagnosis not present

## 2018-08-25 DIAGNOSIS — C329 Malignant neoplasm of larynx, unspecified: Secondary | ICD-10-CM | POA: Diagnosis not present

## 2018-08-25 DIAGNOSIS — I10 Essential (primary) hypertension: Secondary | ICD-10-CM | POA: Diagnosis not present

## 2018-08-25 DIAGNOSIS — E0500 Thyrotoxicosis with diffuse goiter without thyrotoxic crisis or storm: Secondary | ICD-10-CM | POA: Diagnosis not present

## 2018-08-25 DIAGNOSIS — E890 Postprocedural hypothyroidism: Secondary | ICD-10-CM | POA: Diagnosis not present

## 2018-08-25 DIAGNOSIS — M1990 Unspecified osteoarthritis, unspecified site: Secondary | ICD-10-CM | POA: Diagnosis not present

## 2018-08-25 DIAGNOSIS — Z431 Encounter for attention to gastrostomy: Secondary | ICD-10-CM | POA: Diagnosis not present

## 2018-08-26 DIAGNOSIS — E0500 Thyrotoxicosis with diffuse goiter without thyrotoxic crisis or storm: Secondary | ICD-10-CM | POA: Diagnosis not present

## 2018-08-26 DIAGNOSIS — I10 Essential (primary) hypertension: Secondary | ICD-10-CM | POA: Diagnosis not present

## 2018-08-26 DIAGNOSIS — C329 Malignant neoplasm of larynx, unspecified: Secondary | ICD-10-CM | POA: Diagnosis not present

## 2018-08-26 DIAGNOSIS — H269 Unspecified cataract: Secondary | ICD-10-CM | POA: Diagnosis not present

## 2018-08-26 DIAGNOSIS — E43 Unspecified severe protein-calorie malnutrition: Secondary | ICD-10-CM | POA: Diagnosis not present

## 2018-08-26 DIAGNOSIS — E890 Postprocedural hypothyroidism: Secondary | ICD-10-CM | POA: Diagnosis not present

## 2018-08-26 DIAGNOSIS — R1310 Dysphagia, unspecified: Secondary | ICD-10-CM | POA: Diagnosis not present

## 2018-08-26 DIAGNOSIS — Z431 Encounter for attention to gastrostomy: Secondary | ICD-10-CM | POA: Diagnosis not present

## 2018-08-26 DIAGNOSIS — M1990 Unspecified osteoarthritis, unspecified site: Secondary | ICD-10-CM | POA: Diagnosis not present

## 2018-08-26 NOTE — Pre-Procedure Instructions (Signed)
Colum Colt  08/26/2018      [facility] [place], [place] [address] [place] [address] Phone: [phone] Fax: [phone]    Your procedure is scheduled on September 06, 2018.  Report to [facility] Admitting at 0900 A.M.  Call this number if you have problems the morning of surgery:  [phone]   Remember:  Do not eat or drink after midnight.   Take these medicines the morning of surgery with A SIP OF WATER Levothyroxine   7 days prior to surgery STOP taking any Aspirin(unless otherwise instructed by your surgeon), Aleve, Naproxen, Ibuprofen, Motrin, Advil, Goody's, BC's, all herbal medications, fish oil, and all vitamins    Do not wear jewelry.  Do not wear lotions, powders, or cologne, or deodorant.  Men may shave face and neck.  Do not bring valuables to the hospital.  [facility] is not responsible for any belongings or valuables.  Contacts, dentures or bridgework may not be worn into surgery.  Leave your suitcase in the car.  After surgery it may be brought to your room.  For patients admitted to the hospital, discharge time will be determined by your treatment team.  Patients discharged the day of surgery will not be allowed to drive home.    [place]- Preparing For Surgery  Before surgery, you can play an important role. Because skin is not sterile, your skin needs to be as free of germs as possible. You can reduce the number of germs on your skin by washing with CHG (chlorahexidine gluconate) Soap before surgery.  CHG is an antiseptic cleaner which kills germs and bonds with the skin to continue killing germs even after washing.    Oral Hygiene is also important to reduce your risk of infection.  Remember - BRUSH YOUR TEETH THE MORNING OF SURGERY WITH YOUR REGULAR TOOTHPASTE  Please do not use if you have an allergy to CHG or antibacterial soaps. If your skin becomes reddened/irritated stop using the CHG.   Do not shave (including legs and underarms) for at least 48 hours prior to first CHG shower. It is OK to shave your face.  Please follow these instructions carefully.   1. Shower the NIGHT BEFORE SURGERY and the MORNING OF SURGERY with CHG.   2. If you chose to wash your hair, wash your hair first as usual with your normal shampoo.  3. After you shampoo, rinse your hair and body thoroughly to remove the shampoo.  4. Use CHG as you would any other liquid soap. You can apply CHG directly to the skin and wash gently with a scrungie or a clean washcloth.   5. Apply the CHG Soap to your body ONLY FROM THE NECK DOWN.  Do not use on open wounds or open sores. Avoid contact with your eyes, ears, mouth and genitals (private parts). Wash Face and genitals (private parts)  with your normal soap.  6. Wash thoroughly, paying special attention to the area where your surgery will be performed.  7. Thoroughly rinse your body with warm water from the neck down.  8. DO NOT shower/wash with your normal soap after using and rinsing off the CHG Soap.  9. Pat yourself dry with a CLEAN TOWEL.  10. Wear CLEAN PAJAMAS to bed the night before surgery, wear comfortable clothes the morning of surgery  11. Place CLEAN SHEETS on your bed the night of your first shower and DO NOT SLEEP  WITH PETS.    Day of Surgery:  Do not apply any deodorants/lotions.  Please wear clean clothes to the hospital/surgery center.   Remember to brush your teeth WITH YOUR REGULAR TOOTHPASTE.

## 2018-08-27 ENCOUNTER — Encounter (HOSPITAL_COMMUNITY): Payer: Self-pay

## 2018-08-27 ENCOUNTER — Other Ambulatory Visit: Payer: Self-pay

## 2018-08-27 ENCOUNTER — Encounter (HOSPITAL_COMMUNITY)
Admission: RE | Admit: 2018-08-27 | Discharge: 2018-08-27 | Disposition: A | Discharge status: Home / Self Care | Payer: Medicare HMO | Source: Ambulatory Visit | Attending: Otolaryngology | Admitting: Otolaryngology

## 2018-08-27 DIAGNOSIS — E0500 Thyrotoxicosis with diffuse goiter without thyrotoxic crisis or storm: Secondary | ICD-10-CM | POA: Diagnosis not present

## 2018-08-27 DIAGNOSIS — M1990 Unspecified osteoarthritis, unspecified site: Secondary | ICD-10-CM | POA: Diagnosis not present

## 2018-08-27 DIAGNOSIS — R1310 Dysphagia, unspecified: Secondary | ICD-10-CM | POA: Diagnosis not present

## 2018-08-27 DIAGNOSIS — Z01810 Encounter for preprocedural cardiovascular examination: Secondary | ICD-10-CM | POA: Diagnosis not present

## 2018-08-27 DIAGNOSIS — R9431 Abnormal electrocardiogram [ECG] [EKG]: Secondary | ICD-10-CM | POA: Diagnosis not present

## 2018-08-27 DIAGNOSIS — E43 Unspecified severe protein-calorie malnutrition: Secondary | ICD-10-CM | POA: Diagnosis not present

## 2018-08-27 DIAGNOSIS — Z01812 Encounter for preprocedural laboratory examination: Secondary | ICD-10-CM | POA: Insufficient documentation

## 2018-08-27 DIAGNOSIS — Z431 Encounter for attention to gastrostomy: Secondary | ICD-10-CM | POA: Diagnosis not present

## 2018-08-27 DIAGNOSIS — C329 Malignant neoplasm of larynx, unspecified: Secondary | ICD-10-CM | POA: Diagnosis not present

## 2018-08-27 DIAGNOSIS — H269 Unspecified cataract: Secondary | ICD-10-CM | POA: Diagnosis not present

## 2018-08-27 DIAGNOSIS — E890 Postprocedural hypothyroidism: Secondary | ICD-10-CM | POA: Diagnosis not present

## 2018-08-27 DIAGNOSIS — I10 Essential (primary) hypertension: Secondary | ICD-10-CM | POA: Diagnosis not present

## 2018-08-27 LAB — BASIC METABOLIC PANEL
Anion gap: 9 (ref 5–15)
BUN: 15 mg/dL (ref 8–23)
CALCIUM: 9.5 mg/dL (ref 8.9–10.3)
CO2: 32 mmol/L (ref 22–32)
Chloride: 96 mmol/L — ABNORMAL LOW (ref 98–111)
Creatinine, Ser: 0.83 mg/dL (ref 0.61–1.24)
Glucose, Bld: 123 mg/dL — ABNORMAL HIGH (ref 70–99)
Potassium: 4.4 mmol/L (ref 3.5–5.1)
SODIUM: 137 mmol/L (ref 135–145)

## 2018-08-27 LAB — CBC
HCT: 44.6 % (ref 39.0–52.0)
Hemoglobin: 13.2 g/dL (ref 13.0–17.0)
MCH: 24.2 pg — ABNORMAL LOW (ref 26.0–34.0)
MCHC: 29.6 g/dL — AB (ref 30.0–36.0)
MCV: 81.8 fL (ref 78.0–100.0)
PLATELETS: 298 10*3/uL (ref 150–400)
RBC: 5.45 MIL/uL (ref 4.22–5.81)
RDW: 15.8 % — ABNORMAL HIGH (ref 11.5–15.5)
WBC: 5.4 10*3/uL (ref 4.0–10.5)

## 2018-08-27 NOTE — Progress Notes (Signed)
PCP  Caryl Pina  MD   Denies any history of cardiac problems or cardiac testing.  Pt. Is unable to write,requested niece sign consent form. Pt. Verbalizes type of surgery he is having and the name of his Psychologist, sport and exercise.

## 2018-08-30 DIAGNOSIS — E43 Unspecified severe protein-calorie malnutrition: Secondary | ICD-10-CM | POA: Diagnosis not present

## 2018-08-30 DIAGNOSIS — M1990 Unspecified osteoarthritis, unspecified site: Secondary | ICD-10-CM | POA: Diagnosis not present

## 2018-08-30 DIAGNOSIS — Z431 Encounter for attention to gastrostomy: Secondary | ICD-10-CM | POA: Diagnosis not present

## 2018-08-30 DIAGNOSIS — E0500 Thyrotoxicosis with diffuse goiter without thyrotoxic crisis or storm: Secondary | ICD-10-CM | POA: Diagnosis not present

## 2018-08-30 DIAGNOSIS — E890 Postprocedural hypothyroidism: Secondary | ICD-10-CM | POA: Diagnosis not present

## 2018-08-30 DIAGNOSIS — R1310 Dysphagia, unspecified: Secondary | ICD-10-CM | POA: Diagnosis not present

## 2018-08-30 DIAGNOSIS — I10 Essential (primary) hypertension: Secondary | ICD-10-CM | POA: Diagnosis not present

## 2018-08-30 DIAGNOSIS — H269 Unspecified cataract: Secondary | ICD-10-CM | POA: Diagnosis not present

## 2018-08-30 DIAGNOSIS — C329 Malignant neoplasm of larynx, unspecified: Secondary | ICD-10-CM | POA: Diagnosis not present

## 2018-08-31 ENCOUNTER — Other Ambulatory Visit: Payer: Self-pay | Admitting: *Deleted

## 2018-08-31 NOTE — Patient Outreach (Signed)
[facility]) Care Management  08/31/2018  Brandon Hall 1956-11-30 [id number]  EMMI-  RED ON EMMI ALERT - general discharge Day # 1 Date: 08/27/18 1036  Red Alert Reason: got discharge papers? I don't know  D/c home with advanced home care RN Outreach attempt # 1 successful  Patient with raspy voice related to laryngeal cancer He gives permission for wife to speak for him at intervals as they both speak CM on the phone He and his wife are able to verify HIPAA [facility] Care Management RN reviewed and addressed red alert with patient  Brandon Hall confirms his cousin Brandon Hall has his discharge papers to review and follow up for them  The correct answer to the EMMI question would have been yes  Brandon Hall does PEG feedings for Brandon Hall and he is scheduled to get 2 cans of Jevity q 4 hours but has not been able to tolerate 2 cans. After 1 can he reports h e is full but is concerned that he will not meet the requirements Brandon Hall wants him to have surgery next week He also has not had a stool but has received miralax via tube per Brandon Hall He noted on 08/30/18 some frothy white secretions with a tinge of blood after a coughing episode (possibly irritating the throat but no continuous frank bleeding) that has not occurred today 08/31/18  Brandon Hall used chloraseptic spray to assist with this with good results  Then home health Nurse visited on 08/29/18 and will return on Thursday 09/02/18 Allowed Brandon Brandon Hall time to ventilate her concerns about snf placement for Brandon Hall if no improvements He nor she wants facility placement Encouraged them to discuss their wishes for placement and medical treatment interventions No advance directives at this time Discussed competency care guidelines   Condition:  08/19/18 to 08/24/18 admission for laryngeal cancer, severe protein calorie malnutrition, gastrostomy tube placement, HTN, graves disease, hypothyroid, blind in right eye, dyslipidemia, vitamin D deficiency, perianal  abscess, hx of prostate cancer, shingles rash, abnormal transaminases   Appointment on  Monday pre admission for laryngeal cancer surgery Discussed 09/06/18 0830 appt They state Brandon Hall will be taking them    Social Brandon Hall lives at home with his wife, Brandon Hall and cousin, Brandon Hall provides support for them both as Brandon Fadeley has visual issues and diabetes   Advised patient that there will be further automated EMMI- post discharge calls to assess how the patient is doing following the recent hospitalization Advised the patient that another call may be received from a nurse if any of their responses were abnormal. Patient voiced understanding and was appreciative of f/u call.   Plan: [facility] RN CM called Brandon Hall office while she had Brandon and Brandon Hall on the line and spoke with the MD assistant, Brandon Hall to update her that Brandon Hall is only able to tolerate 1 can vs 2 of Jevity at this time.  Brandon Hall confirms that Brandon Hall is okay to continue to take 1 can as tolerated. Also discussed his blood tinge sputum  Brandon Hall commended them and encouraged them to continue to make progress Encouragement provided to Brandon Hall They were given [facility] CM contact number for further questions

## 2018-09-01 ENCOUNTER — Other Ambulatory Visit: Payer: Self-pay | Admitting: *Deleted

## 2018-09-01 DIAGNOSIS — E890 Postprocedural hypothyroidism: Secondary | ICD-10-CM | POA: Diagnosis not present

## 2018-09-01 DIAGNOSIS — R1310 Dysphagia, unspecified: Secondary | ICD-10-CM | POA: Diagnosis not present

## 2018-09-01 DIAGNOSIS — E0500 Thyrotoxicosis with diffuse goiter without thyrotoxic crisis or storm: Secondary | ICD-10-CM | POA: Diagnosis not present

## 2018-09-01 DIAGNOSIS — H269 Unspecified cataract: Secondary | ICD-10-CM | POA: Diagnosis not present

## 2018-09-01 DIAGNOSIS — I10 Essential (primary) hypertension: Secondary | ICD-10-CM | POA: Diagnosis not present

## 2018-09-01 DIAGNOSIS — M1990 Unspecified osteoarthritis, unspecified site: Secondary | ICD-10-CM | POA: Diagnosis not present

## 2018-09-01 DIAGNOSIS — E43 Unspecified severe protein-calorie malnutrition: Secondary | ICD-10-CM | POA: Diagnosis not present

## 2018-09-01 DIAGNOSIS — C329 Malignant neoplasm of larynx, unspecified: Secondary | ICD-10-CM | POA: Diagnosis not present

## 2018-09-01 DIAGNOSIS — Z431 Encounter for attention to gastrostomy: Secondary | ICD-10-CM | POA: Diagnosis not present

## 2018-09-01 NOTE — Patient Outreach (Signed)
[facility]) Care Management  09/01/2018  Brandon Hall [birth year] [id number]   Care coordination  Return call from [place] at [place], Brandon Hall contact number  Plan [facility] RN CM will contact Mr Halderman and wife within 3 business days to follow up and check on his weight  [facility] CM left a message for [place], advanced home care RN  Joelene Millin L. Lavina Hamman, RN, BSN, [place] Coordinator Office number [phone] Mobile number [phone]  Main [facility] number [phone] Fax number [phone]

## 2018-09-01 NOTE — Patient Outreach (Signed)
[facility]) Care Management  09/01/2018  Johnpatrick Jenny Jul 13, 1956 [id number]   Care coordination   [facility] RN CM received a voice message from [facility] ENT  Mr Arzuaga with increase of 1 pound per [facility] CM did not inquired about a current weight on 08/31/18 call to pt   Plan [facility] RN CM will contact Mr Gossard and wife within 3 business days to follow up and check on his weight CM left a voice message for [place] [facility] at [employer]. Lavina Hamman, RN, BSN, [place] Coordinator Office number [phone] Mobile number [phone]  Main [facility] number [phone] Fax number [phone]

## 2018-09-02 ENCOUNTER — Other Ambulatory Visit: Payer: Self-pay | Admitting: *Deleted

## 2018-09-02 NOTE — Patient Outreach (Addendum)
[facility]) Care Management  09/02/2018  Brandon Hall 02-03-1956 [id number]   EMMI- follow up after made initial contact for the RED ON EMMI ALERT - general discharge Day # 1 Date: 08/27/18 1036  Red Alert Reason: got discharge papers? I don't know  He and his wife are able to verify HIPAA Brandon Hall provided his name and DOB today but took the phone to his wife to provide the address [facility] Care Management RN reviewed and addressed red alertwith patient, his wife and Brandon Hall, family member At intervals there was a noted static in Brandon Hall's telephone making hearing difficult   PEG feedings CM was informed he is scheduled to get 2 cans of Jevity q 4 hours by [name], Epic indicates 1.5 cans and again today the confirm he is now up to 1.5 vs 1 can  CM discussed that he should do his best during another discussion about their concerns that he will not meet the requirements [name] wants him to have surgery next week CM asked if Brandon Hall had been weighed and was informed he had not been weigh and they are waiting to get weigh at upcoming MD visit.  CM discussed getting Brandon Hall to bring a scale to check Brandon Hall weight.  CM discussed that weighing would give them information related to the success of the feedings and meeting the requirement for surgery.  CM discussed checking on having a scale sent but it may not arrive prior to 09/06/18 MD visit. Again CM encouraged them to have [name] bring in a scale   Brandon Hall reports today that she has noted he has breathing issues CM encourage removal of secretions, use of chloraseptic spray and contact to [facility] RN , CM or 24 hr RN line for concerns Brandon Hall was able to clear his throat during the call and stated he felt better, CM emphasized the need for lots of secretion removal to prevent breathing issues [name] confirmed during the call he had not been compliant with this practice and voiced he would increase compliance  Home health Nurse, Brandon Hall visited   09/01/18 CM discussed the call from [name] CM discuss with [name] the possibility of Brandon Hall needs for a trach after the surgery, encouraged her to have a conversation with Brandon Hall about her concerns and she states she did not know how she felt about that, told CM to speak with Brandon Hall about this and transferred the phone to Brandon Hall CM reviewed with Brandon Hall keeping secretions to a minimum, fluid intake/PEG feedings, possible trach after surgery and how [name] respond when [name] mentioned. Brandon Hall states that she feels [name] will be able to complete the task prn. Brandon Hall provided encouragement to Mrs Sabatini.  "she will just have to learn.  She can do it and she is now doing well with his feedings." Brandon Hall reports having some experience with tube feeding and trach care related to "my son" CM again discussed competency related to facility placement and their previous voiced concerns.  They states they will discuss this and understood it may be an optionbut not a preference  Condition:  08/19/18 to 08/24/18 admission for laryngeal cancer, severe protein calorie malnutrition, gastrostomy tube placement, HTN, graves disease, hypothyroid, blind in right eye, dyslipidemia, vitamin D deficiency, perianal abscess, hx of prostate cancer, shingles rash, abnormal transaminases   Appointment on  Monday pre admission for laryngeal cancer surgery Discussed 09/06/18 0830 appointment They state Brandon Hall will be taking them  09/14/18 Dr Dettinger hospital follow up  Social Brandon Hall lives at home with his wife, Brandon Hall and  Brandon Hall provides support for them both as Mrs Pevey has visual issues and diabetes   Advised patient that there will be further automated EMMI-post discharge calls to assess how the patient is doing following the recent hospitalization Advised the patient that another call may be received from a nurse if any of their responses were abnormal. Patient voiced understanding and was appreciative of f/u  call.  Consent: [facility] RN CM reviewed [facility] services with patient.  He denies need of services from [facility]/Telephonic RN CM, pharmacy, health coach, NP or SW at this time  Plan: [facility] RN CM called Brandon Hall Advanced home care RN back after received a voice message from her that was left on 09/01/18 at 1234. Brandon Hall was unable to speak with CM but called back. CM updated her on the call to the patient, [name] office while she had Brandon and Mrs Hall on the line and spoke with the MD assistant, Brandon Hall to update her that Brandon Hall is only able to tolerate 1 can vs 2 of Jevity at this time.  Brandon Hall confirms that Brandon Hall is okay to continue to take 1 can as tolerated.  Updated her that Brandon Hall commended them and encouraged them to continue to make progress Encouragement provided to Brandon & Mrs Hall They were given [facility] CM contact number for further questions  Cm discussed checking on his weight but was informed that they do not have a scale in the home. Brandon Hall, [facility] RN visited Brandon Hall on 09/01/18 am. Brandon Hall the cousin has one but has not as of 09/01/18 brought the scale to the home to weigh the pt. CM discussed that CM had been informed that [facility] had informed Brandon Hall of a gain of 1 pound.  CM and HH RN also discussed concerns with the wife providing the care and the wife's voiced concerns about care of Brandon Hall after the pending surgery. Brandon Hall has been educating the wife and providing lots of encouragement for her to do the home care tasks when no one is available. There was discussed times that tasks for nutrition or administration of medicine was not completed. Brandon Hall has discussed some options of care after the pending surgery when Brandon Nissley will have a trach  CM discussed [facility] RN keeping ENT staff, Brandon Hall updated  [facility] RN welcomes a call from [facility] RN  [place] L. Noelle Penner, RN, BSN, CCM [facility] Telephonic Care Management Care Coordinator Office number [phone] Mobile number [phone]  Main [facility] number [phone] Fax number  [phone]

## 2018-09-03 ENCOUNTER — Other Ambulatory Visit: Payer: Self-pay | Admitting: Family Medicine

## 2018-09-03 ENCOUNTER — Other Ambulatory Visit: Payer: Self-pay | Admitting: *Deleted

## 2018-09-03 DIAGNOSIS — I10 Essential (primary) hypertension: Secondary | ICD-10-CM

## 2018-09-03 NOTE — Patient Outreach (Addendum)
[facility]) Care Management  09/03/2018  Allie Gerhold [birth year] [id number]    Care coordination   [facility] RN CM received a call from [facility] from ENT  Provided updated on contact to [place] home care RN and [facility] Mrs Pidgeon  Pt has not yet weighed, no scales at the home.  Cm provided the contact number for Estill Bamberg for Olivia Mackie to call Discussed concern of anticipated weight gain concerns related to pending surgery Discussed with Olivia Mackie Mr Verastegui reported concern with breathing with rushing to try to get to Mrs Wedge's location during the call on 09/02/18.He was encouraged to clear his throat, use chloraseptic spray and issue resolved with help of family member, Levon Hedger confirms Brayton Layman is only a friend    Plan [facility] RN CM will follow up after his 09/06/18 pre admission appointment with Mr Tamala Julian within in 4 business days, discuss [place] RN CM services again and plan for case closure if services not needed [place] RN Cm will continue to collaborate with [facility] home care RN, Estill Bamberg and Research officer, political party at ENT prn  CM made contact with [place] about assisting Mr Tamala Julian with a [place]. Lavina Hamman, RN, BSN, [place] Coordinator Office number [phone] Mobile number [phone]  Main [facility] number [phone] Fax number [phone]

## 2018-09-06 ENCOUNTER — Encounter (HOSPITAL_COMMUNITY): Payer: Self-pay | Admitting: *Deleted

## 2018-09-06 ENCOUNTER — Inpatient Hospital Stay (HOSPITAL_COMMUNITY): Payer: Medicare HMO | Admitting: Vascular Surgery

## 2018-09-06 ENCOUNTER — Encounter (HOSPITAL_COMMUNITY)
Admission: RE | Disposition: A | Discharge status: Home Health | Payer: Self-pay | Source: Home / Self Care | Attending: Otolaryngology

## 2018-09-06 ENCOUNTER — Inpatient Hospital Stay (HOSPITAL_COMMUNITY)
Admission: RE | Admit: 2018-09-06 | Discharge: 2018-09-12 | DRG: 011 | Disposition: A | Discharge status: Home Health | Payer: Medicare HMO | Attending: Otolaryngology | Admitting: Otolaryngology

## 2018-09-06 ENCOUNTER — Inpatient Hospital Stay (HOSPITAL_COMMUNITY): Payer: Medicare HMO | Admitting: Certified Registered"

## 2018-09-06 ENCOUNTER — Other Ambulatory Visit: Payer: Self-pay

## 2018-09-06 DIAGNOSIS — K140 Glossitis: Secondary | ICD-10-CM | POA: Diagnosis not present

## 2018-09-06 DIAGNOSIS — Z800 Family history of malignant neoplasm of digestive organs: Secondary | ICD-10-CM

## 2018-09-06 DIAGNOSIS — R4702 Dysphasia: Secondary | ICD-10-CM | POA: Diagnosis not present

## 2018-09-06 DIAGNOSIS — I10 Essential (primary) hypertension: Secondary | ICD-10-CM | POA: Diagnosis not present

## 2018-09-06 DIAGNOSIS — Z23 Encounter for immunization: Secondary | ICD-10-CM

## 2018-09-06 DIAGNOSIS — Z681 Body mass index (BMI) 19 or less, adult: Secondary | ICD-10-CM | POA: Diagnosis not present

## 2018-09-06 DIAGNOSIS — Z87891 Personal history of nicotine dependence: Secondary | ICD-10-CM

## 2018-09-06 DIAGNOSIS — C329 Malignant neoplasm of larynx, unspecified: Secondary | ICD-10-CM | POA: Diagnosis not present

## 2018-09-06 DIAGNOSIS — C140 Malignant neoplasm of pharynx, unspecified: Secondary | ICD-10-CM | POA: Diagnosis not present

## 2018-09-06 DIAGNOSIS — E785 Hyperlipidemia, unspecified: Secondary | ICD-10-CM | POA: Diagnosis present

## 2018-09-06 DIAGNOSIS — Z923 Personal history of irradiation: Secondary | ICD-10-CM

## 2018-09-06 DIAGNOSIS — R739 Hyperglycemia, unspecified: Secondary | ICD-10-CM | POA: Diagnosis present

## 2018-09-06 DIAGNOSIS — Z8249 Family history of ischemic heart disease and other diseases of the circulatory system: Secondary | ICD-10-CM | POA: Diagnosis not present

## 2018-09-06 DIAGNOSIS — I160 Hypertensive urgency: Secondary | ICD-10-CM

## 2018-09-06 DIAGNOSIS — C323 Malignant neoplasm of laryngeal cartilage: Secondary | ICD-10-CM | POA: Diagnosis not present

## 2018-09-06 DIAGNOSIS — Z79899 Other long term (current) drug therapy: Secondary | ICD-10-CM | POA: Diagnosis not present

## 2018-09-06 DIAGNOSIS — Z9002 Acquired absence of larynx: Secondary | ICD-10-CM | POA: Diagnosis not present

## 2018-09-06 DIAGNOSIS — E43 Unspecified severe protein-calorie malnutrition: Secondary | ICD-10-CM | POA: Diagnosis present

## 2018-09-06 DIAGNOSIS — E559 Vitamin D deficiency, unspecified: Secondary | ICD-10-CM | POA: Diagnosis not present

## 2018-09-06 DIAGNOSIS — R1310 Dysphagia, unspecified: Secondary | ICD-10-CM | POA: Diagnosis not present

## 2018-09-06 DIAGNOSIS — Z8546 Personal history of malignant neoplasm of prostate: Secondary | ICD-10-CM

## 2018-09-06 DIAGNOSIS — J387 Other diseases of larynx: Secondary | ICD-10-CM | POA: Diagnosis not present

## 2018-09-06 DIAGNOSIS — Z79890 Hormone replacement therapy (postmenopausal): Secondary | ICD-10-CM | POA: Diagnosis not present

## 2018-09-06 DIAGNOSIS — Z931 Gastrostomy status: Secondary | ICD-10-CM

## 2018-09-06 DIAGNOSIS — E890 Postprocedural hypothyroidism: Secondary | ICD-10-CM | POA: Diagnosis not present

## 2018-09-06 DIAGNOSIS — H5461 Unqualified visual loss, right eye, normal vision left eye: Secondary | ICD-10-CM | POA: Diagnosis present

## 2018-09-06 DIAGNOSIS — C760 Malignant neoplasm of head, face and neck: Secondary | ICD-10-CM | POA: Diagnosis not present

## 2018-09-06 HISTORY — PX: LARYNGETOMY: SHX5202

## 2018-09-06 HISTORY — PX: RADICAL NECK DISSECTION: SHX2284

## 2018-09-06 HISTORY — PX: DIRECT LARYNGOSCOPY: SHX5326

## 2018-09-06 LAB — GLUCOSE, CAPILLARY
GLUCOSE-CAPILLARY: 228 mg/dL — AB (ref 70–99)
Glucose-Capillary: 186 mg/dL — ABNORMAL HIGH (ref 70–99)

## 2018-09-06 LAB — PROTIME-INR
INR: 1.07
Prothrombin Time: 13.8 seconds (ref 11.4–15.2)

## 2018-09-06 SURGERY — LARYNGOSCOPY, DIRECT
Anesthesia: General | Site: Throat | Laterality: Right

## 2018-09-06 MED ORDER — ONDANSETRON HCL 4 MG/2ML IJ SOLN: 4.0000 mg | Freq: Once | INTRAMUSCULAR | Status: DC | PRN

## 2018-09-06 MED ORDER — EPINEPHRINE HCL (NASAL) 0.1 % NA SOLN
NASAL | Status: AC
  Filled 2018-09-06: qty 30

## 2018-09-06 MED ORDER — SODIUM CHLORIDE 0.9 % IV SOLN
3.0000 g | Freq: Once | INTRAVENOUS | Status: AC
  Administered 2018-09-06: 3 g via INTRAVENOUS
  Filled 2018-09-06: qty 3

## 2018-09-06 MED ORDER — PRO-STAT SUGAR FREE PO LIQD
30.0000 mL | Freq: Every day | ORAL | Status: DC
  Administered 2018-09-06 – 2018-09-12 (×7): 30 mL
  Filled 2018-09-06 (×7): qty 30

## 2018-09-06 MED ORDER — ONDANSETRON HCL 4 MG/2ML IJ SOLN
INTRAMUSCULAR | Status: DC | PRN
  Administered 2018-09-06: 4 mg via INTRAVENOUS

## 2018-09-06 MED ORDER — DEXAMETHASONE SODIUM PHOSPHATE 10 MG/ML IJ SOLN
INTRAMUSCULAR | Status: AC
  Filled 2018-09-06: qty 1

## 2018-09-06 MED ORDER — VITAMIN D (ERGOCALCIFEROL) 1.25 MG (50000 UNIT) PO CAPS
50000.0000 [IU] | ORAL_CAPSULE | ORAL | Status: DC
  Administered 2018-09-11: 50000 [IU]
  Filled 2018-09-06: qty 1

## 2018-09-06 MED ORDER — SODIUM CHLORIDE 0.9 % IV SOLN
INTRAVENOUS | Status: DC | PRN
  Administered 2018-09-06: 25 ug/min via INTRAVENOUS

## 2018-09-06 MED ORDER — LIDOCAINE 2% (20 MG/ML) 5 ML SYRINGE
INTRAMUSCULAR | Status: AC
  Filled 2018-09-06: qty 10

## 2018-09-06 MED ORDER — HYDROCHLOROTHIAZIDE 25 MG PO TABS
12.5000 mg | ORAL_TABLET | Freq: Every day | ORAL | Status: DC
  Administered 2018-09-06: 12.5 mg via ORAL
  Filled 2018-09-06 (×2): qty 0.5

## 2018-09-06 MED ORDER — HYDROCHLOROTHIAZIDE 12.5 MG PO CAPS
12.5000 mg | ORAL_CAPSULE | Freq: Every day | ORAL | Status: DC
  Filled 2018-09-06: qty 1

## 2018-09-06 MED ORDER — 0.9 % SODIUM CHLORIDE (POUR BTL) OPTIME
TOPICAL | Status: DC | PRN
  Administered 2018-09-06: 1000 mL

## 2018-09-06 MED ORDER — LIDOCAINE 2% (20 MG/ML) 5 ML SYRINGE
INTRAMUSCULAR | Status: DC | PRN
  Administered 2018-09-06: 40 mg via INTRAVENOUS

## 2018-09-06 MED ORDER — PHENYLEPHRINE 40 MCG/ML (10ML) SYRINGE FOR IV PUSH (FOR BLOOD PRESSURE SUPPORT)
PREFILLED_SYRINGE | INTRAVENOUS | Status: AC
  Filled 2018-09-06: qty 10

## 2018-09-06 MED ORDER — PRAVASTATIN SODIUM 40 MG PO TABS
40.0000 mg | ORAL_TABLET | Freq: Every day | ORAL | Status: DC
  Administered 2018-09-06 – 2018-09-12 (×7): 40 mg
  Filled 2018-09-06 (×7): qty 1

## 2018-09-06 MED ORDER — HYDROMORPHONE HCL 1 MG/ML IJ SOLN
INTRAMUSCULAR | Status: AC
  Filled 2018-09-06: qty 1

## 2018-09-06 MED ORDER — PHENYLEPHRINE 40 MCG/ML (10ML) SYRINGE FOR IV PUSH (FOR BLOOD PRESSURE SUPPORT)
PREFILLED_SYRINGE | INTRAVENOUS | Status: DC | PRN
  Administered 2018-09-06: 120 ug via INTRAVENOUS
  Administered 2018-09-06: 40 ug via INTRAVENOUS
  Administered 2018-09-06: 80 ug via INTRAVENOUS

## 2018-09-06 MED ORDER — IBUPROFEN 100 MG/5ML PO SUSP
400.0000 mg | Freq: Four times a day (QID) | ORAL | Status: DC | PRN
  Filled 2018-09-06: qty 20

## 2018-09-06 MED ORDER — BACITRACIN ZINC 500 UNIT/GM EX OINT
1.0000 "application " | TOPICAL_OINTMENT | Freq: Three times a day (TID) | CUTANEOUS | Status: DC
  Administered 2018-09-06 – 2018-09-12 (×17): 1 via TOPICAL
  Filled 2018-09-06: qty 28.35
  Filled 2018-09-06: qty 28.4

## 2018-09-06 MED ORDER — FENTANYL CITRATE (PF) 250 MCG/5ML IJ SOLN
INTRAMUSCULAR | Status: AC
  Filled 2018-09-06: qty 5

## 2018-09-06 MED ORDER — ONDANSETRON HCL 4 MG/2ML IJ SOLN
INTRAMUSCULAR | Status: AC
  Filled 2018-09-06: qty 2

## 2018-09-06 MED ORDER — ROCURONIUM BROMIDE 50 MG/5ML IV SOSY
PREFILLED_SYRINGE | INTRAVENOUS | Status: AC
  Filled 2018-09-06: qty 10

## 2018-09-06 MED ORDER — DEXAMETHASONE SODIUM PHOSPHATE 10 MG/ML IJ SOLN
INTRAMUSCULAR | Status: DC | PRN
  Administered 2018-09-06: 10 mg via INTRAVENOUS

## 2018-09-06 MED ORDER — EPHEDRINE 5 MG/ML INJ
INTRAVENOUS | Status: AC
  Filled 2018-09-06: qty 10

## 2018-09-06 MED ORDER — MIDAZOLAM HCL 2 MG/2ML IJ SOLN
INTRAMUSCULAR | Status: AC
  Filled 2018-09-06: qty 2

## 2018-09-06 MED ORDER — DEXTROSE-NACL 5-0.9 % IV SOLN
INTRAVENOUS | Status: DC
  Administered 2018-09-06 – 2018-09-08 (×3): via INTRAVENOUS

## 2018-09-06 MED ORDER — JEVITY 1.5 CAL/FIBER PO LIQD
325.0000 mL | Freq: Four times a day (QID) | ORAL | Status: DC
  Administered 2018-09-06 – 2018-09-12 (×22): 325 mL
  Filled 2018-09-06 (×24): qty 1000

## 2018-09-06 MED ORDER — SUGAMMADEX SODIUM 200 MG/2ML IV SOLN
INTRAVENOUS | Status: DC | PRN
  Administered 2018-09-06: 200 mg via INTRAVENOUS

## 2018-09-06 MED ORDER — LEVOTHYROXINE SODIUM 100 MCG PO TABS
100.0000 ug | ORAL_TABLET | Freq: Every day | ORAL | Status: DC
  Administered 2018-09-07 – 2018-09-12 (×6): 100 ug
  Filled 2018-09-06 (×6): qty 1

## 2018-09-06 MED ORDER — LIDOCAINE-EPINEPHRINE 1 %-1:100000 IJ SOLN
INTRAMUSCULAR | Status: DC | PRN
  Administered 2018-09-06: 20 mL

## 2018-09-06 MED ORDER — MORPHINE SULFATE (PF) 2 MG/ML IV SOLN
2.0000 mg | INTRAVENOUS | Status: DC | PRN
  Administered 2018-09-06 – 2018-09-07 (×3): 2 mg via INTRAVENOUS
  Administered 2018-09-07: 4 mg via INTRAVENOUS
  Filled 2018-09-06: qty 1
  Filled 2018-09-06: qty 2
  Filled 2018-09-06 (×2): qty 1

## 2018-09-06 MED ORDER — ROCURONIUM BROMIDE 100 MG/10ML IV SOLN
INTRAVENOUS | Status: DC | PRN
  Administered 2018-09-06: 50 mg via INTRAVENOUS

## 2018-09-06 MED ORDER — BACITRACIN ZINC 500 UNIT/GM EX OINT
TOPICAL_OINTMENT | CUTANEOUS | Status: DC | PRN
  Administered 2018-09-06: 1 via TOPICAL

## 2018-09-06 MED ORDER — LACTATED RINGERS IV SOLN
INTRAVENOUS | Status: DC
  Administered 2018-09-06 (×2): via INTRAVENOUS

## 2018-09-06 MED ORDER — LIDOCAINE-EPINEPHRINE 1 %-1:100000 IJ SOLN
INTRAMUSCULAR | Status: AC
  Filled 2018-09-06: qty 1

## 2018-09-06 MED ORDER — MEPERIDINE HCL 50 MG/ML IJ SOLN: 6.2500 mg | INTRAMUSCULAR | Status: DC | PRN

## 2018-09-06 MED ORDER — HYDROCODONE-ACETAMINOPHEN 7.5-325 MG/15ML PO SOLN
10.0000 mL | ORAL | Status: DC | PRN
  Administered 2018-09-10 – 2018-09-12 (×3): 15 mL
  Filled 2018-09-06 (×3): qty 15

## 2018-09-06 MED ORDER — HYDROMORPHONE HCL 1 MG/ML IJ SOLN
0.2500 mg | INTRAMUSCULAR | Status: DC | PRN
  Administered 2018-09-06: 0.5 mg via INTRAVENOUS

## 2018-09-06 MED ORDER — FENTANYL CITRATE (PF) 100 MCG/2ML IJ SOLN
INTRAMUSCULAR | Status: DC | PRN
  Administered 2018-09-06: 50 ug via INTRAVENOUS
  Administered 2018-09-06: 25 ug via INTRAVENOUS
  Administered 2018-09-06 (×2): 50 ug via INTRAVENOUS
  Administered 2018-09-06: 25 ug via INTRAVENOUS
  Administered 2018-09-06: 100 ug via INTRAVENOUS
  Administered 2018-09-06: 50 ug via INTRAVENOUS

## 2018-09-06 MED ORDER — PAPAVERINE HCL 30 MG/ML IJ SOLN
INTRAMUSCULAR | Status: AC
  Filled 2018-09-06: qty 2

## 2018-09-06 MED ORDER — EPHEDRINE SULFATE-NACL 50-0.9 MG/10ML-% IV SOSY
PREFILLED_SYRINGE | INTRAVENOUS | Status: DC | PRN
  Administered 2018-09-06: 5 mg via INTRAVENOUS

## 2018-09-06 MED ORDER — PROPOFOL 10 MG/ML IV BOLUS
INTRAVENOUS | Status: DC | PRN
  Administered 2018-09-06: 100 mg via INTRAVENOUS

## 2018-09-06 MED ORDER — HYDROCHLOROTHIAZIDE 25 MG PO TABS
12.5000 mg | ORAL_TABLET | Freq: Every day | ORAL | Status: DC
  Filled 2018-09-06: qty 0.5

## 2018-09-06 MED ORDER — SODIUM CHLORIDE 0.9 % IV SOLN
3.0000 g | Freq: Four times a day (QID) | INTRAVENOUS | Status: DC
  Administered 2018-09-06 – 2018-09-08 (×7): 3 g via INTRAVENOUS
  Filled 2018-09-06 (×9): qty 3

## 2018-09-06 MED ORDER — LACTATED RINGERS IV SOLN
INTRAVENOUS | Status: DC | PRN
  Administered 2018-09-06: 12:00:00 via INTRAVENOUS

## 2018-09-06 MED ORDER — PROPOFOL 10 MG/ML IV BOLUS
INTRAVENOUS | Status: AC
  Filled 2018-09-06: qty 20

## 2018-09-06 MED ORDER — BACITRACIN ZINC 500 UNIT/GM EX OINT
TOPICAL_OINTMENT | CUTANEOUS | Status: AC
  Filled 2018-09-06: qty 28.35

## 2018-09-06 MED ORDER — POLYETHYLENE GLYCOL 3350 17 G PO PACK
17.0000 g | PACK | Freq: Every day | ORAL | Status: DC
  Administered 2018-09-06 – 2018-09-12 (×7): 17 g
  Filled 2018-09-06 (×7): qty 1

## 2018-09-06 SURGICAL SUPPLY — 66 items
APPLIER CLIP 9.375 SM OPEN (CLIP) ×5
BLADE SURG 10 STRL SS (BLADE) ×5 IMPLANT
BLADE SURG 15 STRL LF DISP TIS (BLADE) IMPLANT
BLADE SURG 15 STRL SS (BLADE)
CANISTER SUCT 3000ML PPV (MISCELLANEOUS) ×5 IMPLANT
CATH COUDE FOLEY 5CC 14FR (CATHETERS) ×5 IMPLANT
CLEANER TIP ELECTROSURG 2X2 (MISCELLANEOUS) ×5 IMPLANT
CLIP APPLIE 9.375 SM OPEN (CLIP) ×3 IMPLANT
CONT SPEC 4OZ CLIKSEAL STRL BL (MISCELLANEOUS) ×5 IMPLANT
CORD BIPOLAR FORCEPS 12FT (ELECTRODE) ×5 IMPLANT
COVER MAYO STAND STRL (DRAPES) ×10 IMPLANT
COVER SURGICAL LIGHT HANDLE (MISCELLANEOUS) ×5 IMPLANT
DRAIN CHANNEL 15F RND FF W/TCR (WOUND CARE) IMPLANT
DRAIN SNY 10 ROU (WOUND CARE) IMPLANT
DRAIN WOUND SNY 15 RND (WOUND CARE) IMPLANT
DRAPE HALF SHEET 40X57 (DRAPES) ×5 IMPLANT
ELECT COATED BLADE 2.86 ST (ELECTRODE) ×5 IMPLANT
ELECT REM PT RETURN 9FT ADLT (ELECTROSURGICAL) ×5
ELECTRODE REM PT RTRN 9FT ADLT (ELECTROSURGICAL) ×3 IMPLANT
EVACUATOR SILICONE 100CC (DRAIN) IMPLANT
FORCEPS BIPOLAR SPETZLER 8 1.0 (NEUROSURGERY SUPPLIES) ×5 IMPLANT
GAUZE SPONGE 4X4 16PLY XRAY LF (GAUZE/BANDAGES/DRESSINGS) ×20 IMPLANT
GLOVE BIO SURGEON STRL SZ 6.5 (GLOVE) ×4 IMPLANT
GLOVE BIO SURGEONS STRL SZ 6.5 (GLOVE) ×1
GLOVE BIOGEL PI IND STRL 6.5 (GLOVE) ×3 IMPLANT
GLOVE BIOGEL PI IND STRL 7.0 (GLOVE) ×6 IMPLANT
GLOVE BIOGEL PI IND STRL 7.5 (GLOVE) ×3 IMPLANT
GLOVE BIOGEL PI INDICATOR 6.5 (GLOVE) ×2
GLOVE BIOGEL PI INDICATOR 7.0 (GLOVE) ×4
GLOVE BIOGEL PI INDICATOR 7.5 (GLOVE) ×2
GLOVE ECLIPSE 7.5 STRL STRAW (GLOVE) ×10 IMPLANT
GOWN STRL REUS W/ TWL LRG LVL3 (GOWN DISPOSABLE) ×12 IMPLANT
GOWN STRL REUS W/TWL LRG LVL3 (GOWN DISPOSABLE) ×8
KIT BASIN OR (CUSTOM PROCEDURE TRAY) ×5 IMPLANT
KIT TURNOVER KIT B (KITS) ×5 IMPLANT
NEEDLE PRECISIONGLIDE 27X1.5 (NEEDLE) ×5 IMPLANT
NS IRRIG 1000ML POUR BTL (IV SOLUTION) ×5 IMPLANT
PAD ARMBOARD 7.5X6 YLW CONV (MISCELLANEOUS) ×10 IMPLANT
PENCIL FOOT CONTROL (ELECTRODE) ×5 IMPLANT
SHEARS HARMONIC 9CM CVD (BLADE) ×5 IMPLANT
SOLUTION ANTI FOG 6CC (MISCELLANEOUS) ×5 IMPLANT
STAPLER VISISTAT 35W (STAPLE) ×5 IMPLANT
SUT CHROMIC 3 0 SH 27 (SUTURE) ×20 IMPLANT
SUT CHROMIC 4 0 PS 2 18 (SUTURE) IMPLANT
SUT CHROMIC 5 0 P 3 (SUTURE) IMPLANT
SUT ETHILON 3 0 PS 1 (SUTURE) IMPLANT
SUT ETHILON 5 0 PS 2 18 (SUTURE) ×10 IMPLANT
SUT SILK 2 0 (SUTURE) ×2
SUT SILK 2 0 REEL (SUTURE) IMPLANT
SUT SILK 2-0 18XBRD TIE 12 (SUTURE) ×3 IMPLANT
SUT SILK 3 0 REEL (SUTURE) IMPLANT
SUT SILK 3 0 SH CR/8 (SUTURE) ×10 IMPLANT
SUT SILK 4 0 REEL (SUTURE) ×10 IMPLANT
SUT VIC AB 3-0 FS2 27 (SUTURE) ×5 IMPLANT
SUT VIC AB 3-0 SH 18 (SUTURE) ×5 IMPLANT
SUT VIC AB 4-0 SH 27 (SUTURE) ×6
SUT VIC AB 4-0 SH 27XBRD (SUTURE) ×9 IMPLANT
TOWEL GREEN STERILE (TOWEL DISPOSABLE) ×5 IMPLANT
TRAY ENT MC OR (CUSTOM PROCEDURE TRAY) ×5 IMPLANT
TRAY FOLEY MTR SLVR 14FR STAT (SET/KITS/TRAYS/PACK) ×5 IMPLANT
TUBE CONNECTING 12'X1/4 (SUCTIONS) ×1
TUBE CONNECTING 12X1/4 (SUCTIONS) ×4 IMPLANT
TUBE FEEDING 10FR FLEXIFLO (MISCELLANEOUS) IMPLANT
TUBE TRACH SHILEY  6 DIST  CUF (TUBING) IMPLANT
TUBE TRACH SHILEY 8 DIST CUF (TUBING) IMPLANT
WATER STERILE IRR 1000ML POUR (IV SOLUTION) ×5 IMPLANT

## 2018-09-06 NOTE — Progress Notes (Addendum)
Patients Niece and patient expressed concerns about proceeding with today's surgery due to patients breathing.  RN called anesthesiologist to come listen to patients lungs with their concern.  Patient is not taking any inhalers at home at this point.  They also expressed concern that Dr. Constance Holster wanted the patient to gain a little weight before he had the surgery but the patient has only gained about a pound or so.  I instructed Brayton Layman (pateints niece) to express those concerns with Dr. Constance Holster and I would have anesthesia evaluate the patients breathing this morning  Dr. Constance Holster came to see patient, he plans to proceed with surgery as long as anesthesia is ok with proceeding.    Dr. Royce Macadamia called to evaluate patient

## 2018-09-06 NOTE — Anesthesia Procedure Notes (Signed)
Arterial Line Insertion Start/End9/08/2018 11:00 AM, 09/06/2018 11:05 AM Performed by: Oleta Mouse, MD, Gwyndolyn Saxon, CRNA, CRNA  Patient location: Pre-op. Preanesthetic checklist: patient identified, IV checked, site marked, risks and benefits discussed, surgical consent, monitors and equipment checked, pre-op evaluation, timeout performed and anesthesia consent Lidocaine 1% used for infiltration Left, radial was placed Catheter size: 20 G Hand hygiene performed , maximum sterile barriers used  and Seldinger technique used Allen's test indicative of satisfactory collateral circulation Attempts: 1 Procedure performed without using ultrasound guided technique. Following insertion, dressing applied and Biopatch. Post procedure assessment: normal  Patient tolerated the procedure well with no immediate complications. Additional procedure comments: A-line placed by J. Dongnell, CRNA.

## 2018-09-06 NOTE — Anesthesia Procedure Notes (Addendum)
Procedure Name: Intubation Date/Time: 09/06/2018 11:50 AM Performed by: Gwyndolyn Saxon, CRNA Pre-anesthesia Checklist: Patient identified, Emergency Drugs available, Suction available, Patient being monitored and Timeout performed Patient Re-evaluated:Patient Re-evaluated prior to induction Oxygen Delivery Method: Circle system utilized Preoxygenation: Pre-oxygenation with 100% oxygen Induction Type: IV induction Laryngoscope size: surgeon used laryngoscope with light. Grade View: Grade IV Tube type: Oral Tube size: 6.0 mm Number of attempts: 1 Placement Confirmation: positive ETCO2,  CO2 detector and breath sounds checked- equal and bilateral Secured at: 21 cm Tube secured with: Tape Dental Injury: Teeth and Oropharynx as per pre-operative assessment  Difficulty Due To: Difficulty was anticipated Comments: Surgeon intubated pt; stated no view of vocal cords due to tumor involvement. Positive ETCO2, positive chest rise, BBSH.

## 2018-09-06 NOTE — Anesthesia Preprocedure Evaluation (Addendum)
Anesthesia Evaluation  Patient identified by MRN, date of birth, ID band Patient awake    Reviewed: Allergy & Precautions, NPO status , Patient's Chart, lab work & pertinent test results  Airway Mallampati: II  TM Distance: >3 FB Neck ROM: Full    Dental  (+) Edentulous Upper, Edentulous Lower   Pulmonary former smoker,  Laryngeal Ca   Pulmonary exam normal breath sounds clear to auscultation + decreased breath sounds      Cardiovascular hypertension, Pt. on medications Normal cardiovascular exam Rhythm:Regular Rate:Normal  EkG - NSR with LVH by voltage, RAE, LAFB   Neuro/Psych Blind OD negative neurological ROS  negative psych ROS   GI/Hepatic   Endo/Other  Hypothyroidism Hyperthyroidism Hyperlipidemia Hx/o Grave's disease S/P RAI Protein Calorie malnutrition  Renal/GU    Hx/o prostate Ca    Musculoskeletal  (+) Arthritis , Osteoarthritis,    Abdominal   Peds  Hematology negative hematology ROS (+)   Anesthesia Other Findings   Reproductive/Obstetrics                            Anesthesia Physical Anesthesia Plan  ASA: III  Anesthesia Plan: General   Post-op Pain Management:    Induction: Intravenous  PONV Risk Score and Plan: 4 or greater and Ondansetron, Dexamethasone, Treatment may vary due to age or medical condition and Midazolam  Airway Management Planned: Oral ETT  Additional Equipment: Arterial line  Intra-op Plan:   Post-operative Plan: Extubation in OR  Informed Consent: I have reviewed the patients History and Physical, chart, labs and discussed the procedure including the risks, benefits and alternatives for the proposed anesthesia with the patient or authorized representative who has indicated his/her understanding and acceptance.     Plan Discussed with: CRNA, Anesthesiologist and Surgeon  Anesthesia Plan Comments:         Anesthesia Quick  Evaluation

## 2018-09-06 NOTE — Op Note (Signed)
OPERATIVE REPORT  DATE OF SURGERY: 09/06/2018  PATIENT:  Brandon Hall,  62 y.o. male  PRE-OPERATIVE DIAGNOSIS:  Laryngeal Mass  POST-OPERATIVE DIAGNOSIS:  Laryngeal Mass  PROCEDURE:  Procedure(s): DIRECT LARYNGOSCOPY biopsy with frozen section total LARYNGECTOMY Modified NECK DISSECTION  SURGEON:  Beckie Salts, MD  ASSISTANTS: Dr. Blenda Nicely  ANESTHESIA:   General   EBL: 100 ml  DRAINS: 10 French round JP x2  LOCAL MEDICATIONS USED:  None  SPECIMEN: Biopsy of laryngeal mass with frozen section, consistent with squamous cell carcinoma. Total laryngectomy with right neck dissection level 2 3 and 4. Base of tongue margin for frozen section analysis, negative for carcinoma.  COUNTS:  Correct  PROCEDURE DETAILS: The patient was taken to the operating room and placed on the operating table in the supine position. Following induction of general anesthesia, the table was turned 90 degrees and I performed initial endoscopy and passed a #6 endotracheal tube.  The tumor was very large, right side supraglottic.  The cords were not visualized but I was able to see the laryngeal introitus.  Once the tube was placed, endoscopy was performed and biopsies were taken.  Frozen section analysis consistent with carcinoma.  The piriform sinus and posterior pharyngeal wall were all clear.  The esophageal entry was also clear.  The table was turned back to its normal position and the neck was prepped and draped in standard fashion.  An incision was outlined transverse along the midportion of the thyroid cartilage with extension of the right side in order to accomplish the neck dissection.  A separate stomal incision was created about 2 cm below the main incision.  Letter cautery was used to incise the skin and subcutaneous tissue.  The platysmal layer was divided and subplatysmal flap was elevated superiorly and inferiorly.  Stay sutures were used on the superior flap.  1.  Right modified neck dissection,  level 2, 3, and 4.  The fascia overlying the sternocleidomastoid was brought forward off the muscle.  Muscle was then reflected posteriorly.  The spinal accessory nerve was identified and dissected.  The fibrofatty tissue posterior superior to the nerve was dissected out using electrocautery and brought under the nerve and brought forward.  The residual nodal tumor mass was stuck to the internal jugular veins of the vein was sacrificed up superiorly.  Dissection continued down along the carotid sheath.  The carotid and the vagus were kept intact.  This is all dissected down inferiorly.  The lower jugular was then identified and ligated between clamps and divided.  A 2-0 free-tie and a 3-0 silk suture ligature were used on the inferior stump and a 3-0 suture ligature was used on the superior stump.  The cutaneous branches of the cervical plexus were the posterior limit of dissection.  The lymph node tissue was brought forward and off of the carotid system back toward strap muscles.  The neck dissection specimen was kept en bloc with the larynx.  2.  Total laryngectomy.  The larynx was skeletonized superiorly by transecting the muscles attachment superior to the hyoid bone.  The greater horns were also dissected free and pharyngeal mucosa was dissected off of the thyroid.  On the left side the constrictor muscles were dissected off of the thyroid lamina and preserved for future use.  On the right side I was unable to isolate the muscles due to the severe radiation artifact.  On the lower aspect of the dissection the trachea was identified underlying the strap muscles.  The external hyoid muscle was transected off of the lower attachment of the hyoid bone and dissected down inferiorly keeping the inferior flap in place with blood supply.  This was saved for later additional secondary closure.  I attempted to isolate the left lobe of the thyroid to preserve it but was unable to identify any thyroid tissue.  The  trachea was opened between the second and third ring and a reinforced #8 tube was then placed.  The party wall was divided and brought up superiorly.  The pharynx was entered at the left supraglottic region.  Under direct visualization the mucosal cuts were accomplished keeping adequate mucosal margins.  Adequate mucosa was preserved as well for later closure.  The larynx was removed and the entire specimen was delivered.  Specimen was opened posteriorly to identify the tumor and to inspect the margins.  Additional margin was taken from the tongue base which was clear.  The stomal skin incision was created and the stoma was matured using interrupted 3-0 Vicryl mattress sutures.  The neopharynx was closed using running 4-0 Vicryl on inferiorly and one from each side and the superior aspect and a "T" fashion.  Prior to closure a cricopharyngeal myotomy was performed using a #15 scalpel.  After closure was accomplished saline was forced through the pharynx using an Asepto syringe and there is no visible leak.  Secondary layer closure was accomplished using the sternohyoid muscle and the available constrictor muscle from the left side.  The drain was placed on each side exited through separate stab incision secured in place with nylon suture.  Skin was closed in layers using interrupted 3-0 chromic on the platysma and staples on the skin.  The drains were charged.  Patient was awakened extubated and transferred to recovery in stable condition.    PATIENT DISPOSITION:  To PACU, stable

## 2018-09-06 NOTE — Transfer of Care (Signed)
Immediate Anesthesia Transfer of Care Note  Patient: Brandon Hall  Procedure(s) Performed: DIRECT LARYNGOSCOPY biopsy with frozen section (N/A Throat) total LARYNGECTOMY (N/A Neck) Modified NECK DISSECTION (Right Neck)  Patient Location: PACU  Anesthesia Type:General  Level of Consciousness: awake, alert , oriented and patient cooperative  Airway & Oxygen Therapy: Patient Spontanous Breathing  Post-op Assessment: Report given to RN and Post -op Vital signs reviewed and stable  Post vital signs: Reviewed and stable  Last Vitals:  Vitals Value Taken Time  BP 142/77 09/06/2018  4:06 PM  Temp    Pulse 54 09/06/2018  4:14 PM  Resp 11 09/06/2018  4:14 PM  SpO2 99 % 09/06/2018  4:14 PM  Vitals shown include unvalidated device data.  Last Pain:  Vitals:   09/06/18 0924  TempSrc:   PainSc: 0-No pain      Patients Stated Pain Goal: 2 (60/04/59 9774)  Complications: No apparent anesthesia complications

## 2018-09-06 NOTE — Anesthesia Procedure Notes (Deleted)
Procedure Name: Intubation Date/Time: 09/06/2018 11:50 AM Performed by: Gwyndolyn Saxon, CRNA Pre-anesthesia Checklist: Patient identified, Emergency Drugs available, Suction available and Patient being monitored Patient Re-evaluated:Patient Re-evaluated prior to induction Oxygen Delivery Method: Circle system utilized Preoxygenation: Pre-oxygenation with 100% oxygen Induction Type: IV induction Laryngoscope size: intubated by surgeon with straight largoscopy biopsy handle. Grade View: Grade IV Tube type: Oral Tube size: 6.0 mm Number of attempts: 1 Placement Confirmation: positive ETCO2,  CO2 detector and breath sounds checked- equal and bilateral Secured at: 21 cm Tube secured with: Tape Dental Injury: Teeth and Oropharynx as per pre-operative assessment  Difficulty Due To: Difficulty was anticipated Comments: Pt bed turn towards surgeon. Induction by CRNA. DL by Psychologist, sport and exercise. No view of vocal cords; positive ETCO2, positive chest rise and BBSH. MDA present.

## 2018-09-06 NOTE — Anesthesia Procedure Notes (Deleted)
Performed by: Jonquil Stubbe W, CRNA ° ° ° ° ° ° °

## 2018-09-06 NOTE — H&P (Signed)
Brandon Hall is an 62 y.o. male.   Chief Complaint: Inability to swallow HPI: History of laryngeal cancer, status post radiation therapy persistent tumor.  Having difficulty swallowing, aspirating.  Significant malnutrition, weight loss.  Here for salvage laryngectomy/neck dissection.      Past Medical History:  Diagnosis Date  . Arthritis   . Blindness of right eye   . Cancer ([place])    pharyngeal ca  . Cataract, secondary obscuring vision    LEFT  . Graves disease   . History of radiation therapy 11/2014   [id number] squamous cell carcinoma of L neck, Stage IVB, unknown primary 70 Gy in 35 fractions treated at [facility], finished 12/25/14  . History of shingles    RASH  02-03-2014  PER PCP RESOLVING  . Hypertension    NO MEDS ,  MONITORED BY PCP  . Hypothyroidism, postradioiodine therapy   . Prostate cancer ([place])    DX  SEVERAL  YRS   AGO  WITH RADIATION THERAPY  . Squamous cell carcinoma of head and neck ([place]) 07/12/2014  . 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 COLONOSCOPY  10/20/2011   Procedure: COLONOSCOPY;  Surgeon: Daneil Dolin, MD;  [facility];  Service: Endoscopy;  Laterality: N/A;  1:00 pm  . COLONOSCOPY N/A 02/03/2018   Procedure: COLONOSCOPY;  Surgeon: Daneil Dolin, MD;  [facility];  Service: Endoscopy;  Laterality: N/A;  2:15  . CRYOABLATION N/A 04/07/2014   Procedure: SALVAGE CRYO ABLATION PROSTATE;  Surgeon: Lowella Bandy, MD;  Location: [facility];  Service: Urology;  Laterality: N/A;  . IR GENERIC HISTORICAL  03/27/2017   IR REMOVAL TUN ACCESS W/ PORT W/O FL MOD SED [facility]-INTER[facility] RAD  . LARYNGOSCOPY    . LYMPH NODE BIOPSY           Family History  Problem Relation Age of Onset  . Colon cancer Father   . Hypertension Mother   . Colon cancer Brother   . Colon cancer Brother    Social History:  reports that  he quit smoking about 4 years ago. His smoking use included cigarettes. He has a 40.00 pack-year smoking history. He has never used smokeless tobacco. He reports that he does not drink alcohol or use drugs.  Allergies: No Known Allergies        Medications Prior to Admission  Medication Sig Dispense Refill  . cephALEXin (KEFLEX) 500 MG capsule Take 1 capsule (500 mg total) by mouth 4 (four) times daily. 28 capsule 0  . hydrochlorothiazide (HYDRODIURIL) 12.5 MG tablet Take 1 tablet (12.5 mg total) by mouth daily. 90 tablet 1  . levothyroxine (SYNTHROID, LEVOTHROID) 100 MCG tablet Take 1 tablet (100 mcg total) by mouth daily. 90 tablet 1  . pravastatin (PRAVACHOL) 40 MG tablet Take 1 tablet (40 mg total) by mouth daily. 90 tablet 1  . Vitamin D, Ergocalciferol, (DRISDOL) 50000 units CAPS capsule TAKE 1 CAPSULE EVERY 7 DAYS. 12 capsule 1    Lab Results Last 48 Hours  No results found for this or any previous visit (from the past 48 hour(s)).   Imaging Results (Last 48 hours)  No results found.    ROS: otherwise negative  Blood pressure 126/77, pulse 92, temperature 98 F (36.7 C), temperature source Oral, resp. rate 18, SpO2 96 %.  PHYSICAL EXAM: Overall appearance: Cachectic, with strained and hoarse voice  Head:  Normocephalic, atraumatic. Ears: External auditory canals are clear; tympanic membranes are intact and the middle ears are free of any effusion. Nose: External nose is healthy in appearance. Internal nasal exam free of any lesions or obstruction. Oral Cavity/pharynx:  There are no mucosal lesions or masses identified. Hypopharynx/Larynx: Large supra tumor present with airway obstruction. Neuro:  No identifiable neurologic deficits. Neck: 1 level 2 node, otherwise no palpable neck masses.  Studies Reviewed: CT and PET scan reviewed.    Assessment/Plan Laryngeal cancer, severe malnutrition, dysphasia and aspiration.  Admit to hospital today for salvage total  laryngectomy and right neck dissection. Risks, benefits, complications, alternatives were discussed with him and his family. They all seem to understand and agree to surgery.

## 2018-09-06 NOTE — Anesthesia Procedure Notes (Signed)
Date/Time: 09/06/2018 1:58 PM Performed by: Gwyndolyn Saxon, CRNA Induction Type: Tracheostomy Tube size: 8.0 mm Airway Equipment and Method: Tracheostomy Placement Confirmation: positive ETCO2,  CO2 detector and breath sounds checked- equal and bilateral Comments: Surgeon exchanged 6.0 oral ETT with 8.0 reinforced cuffed ETT via surgical tracheostomy site.

## 2018-09-07 ENCOUNTER — Ambulatory Visit: Payer: Self-pay | Admitting: *Deleted

## 2018-09-07 ENCOUNTER — Other Ambulatory Visit: Payer: Self-pay | Admitting: *Deleted

## 2018-09-07 DIAGNOSIS — Z9002 Acquired absence of larynx: Secondary | ICD-10-CM

## 2018-09-07 DIAGNOSIS — I160 Hypertensive urgency: Secondary | ICD-10-CM

## 2018-09-07 LAB — GLUCOSE, CAPILLARY
Glucose-Capillary: 148 mg/dL — ABNORMAL HIGH (ref 70–99)
Glucose-Capillary: 111 mg/dL — ABNORMAL HIGH (ref 70–99)
Glucose-Capillary: 209 mg/dL — ABNORMAL HIGH (ref 70–99)
Glucose-Capillary: 144 mg/dL — ABNORMAL HIGH (ref 70–99)
Glucose-Capillary: 155 mg/dL — ABNORMAL HIGH (ref 70–99)

## 2018-09-07 LAB — CBC
HCT: 31.9 % — ABNORMAL LOW (ref 39.0–52.0)
Hemoglobin: 9.9 g/dL — ABNORMAL LOW (ref 13.0–17.0)
MCH: 24.8 pg — ABNORMAL LOW (ref 26.0–34.0)
MCHC: 31 g/dL (ref 30.0–36.0)
MCV: 79.8 fL (ref 78.0–100.0)
PLATELETS: 233 10*3/uL (ref 150–400)
RBC: 4 MIL/uL — ABNORMAL LOW (ref 4.22–5.81)
RDW: 16.4 % — AB (ref 11.5–15.5)
WBC: 7.9 10*3/uL (ref 4.0–10.5)

## 2018-09-07 LAB — COMPREHENSIVE METABOLIC PANEL
ALBUMIN: 2.5 g/dL — AB (ref 3.5–5.0)
ALT: 18 U/L (ref 0–44)
AST: 22 U/L (ref 15–41)
Alkaline Phosphatase: 49 U/L (ref 38–126)
Anion gap: 7 (ref 5–15)
BILIRUBIN TOTAL: 0.3 mg/dL (ref 0.3–1.2)
BUN: 10 mg/dL (ref 8–23)
CO2: 26 mmol/L (ref 22–32)
Calcium: 8 mg/dL — ABNORMAL LOW (ref 8.9–10.3)
Chloride: 102 mmol/L (ref 98–111)
Creatinine, Ser: 0.64 mg/dL (ref 0.61–1.24)
GFR calc Af Amer: >60 mL/min (ref >60)
GLUCOSE: 124 mg/dL — AB (ref 70–99)
Potassium: 3.9 mmol/L (ref 3.5–5.1)
Sodium: 135 mmol/L (ref 135–145)
TOTAL PROTEIN: 5.8 g/dL — AB (ref 6.5–8.1)

## 2018-09-07 MED ORDER — HYDRALAZINE HCL 20 MG/ML IJ SOLN
10.0000 mg | INTRAMUSCULAR | Status: DC | PRN
  Administered 2018-09-07: 15 mg via INTRAVENOUS
  Filled 2018-09-07: qty 1

## 2018-09-07 MED ORDER — HYDROCHLOROTHIAZIDE 12.5 MG PO CAPS: 12.5000 mg | ORAL_CAPSULE | Freq: Every day | ORAL | Status: DC

## 2018-09-07 MED ORDER — HYDROCHLOROTHIAZIDE 10 MG/ML ORAL SUSPENSION
12.5000 mg | Freq: Every day | ORAL | Status: DC
  Administered 2018-09-07 – 2018-09-11 (×5): 13 mg
  Filled 2018-09-07 (×6): qty 1.88

## 2018-09-07 MED ORDER — HYDRALAZINE HCL 20 MG/ML IJ SOLN
10.0000 mg | INTRAMUSCULAR | Status: DC | PRN
  Administered 2018-09-07: 10 mg via INTRAVENOUS
  Filled 2018-09-07: qty 1

## 2018-09-07 MED ORDER — INSULIN ASPART 100 UNIT/ML ~~LOC~~ SOLN
0.0000 [IU] | SUBCUTANEOUS | Status: DC
  Administered 2018-09-07: 1 [IU] via SUBCUTANEOUS
  Administered 2018-09-07 (×2): 3 [IU] via SUBCUTANEOUS
  Administered 2018-09-07 (×2): 1 [IU] via SUBCUTANEOUS
  Administered 2018-09-08: 2 [IU] via SUBCUTANEOUS
  Administered 2018-09-09 – 2018-09-10 (×2): 1 [IU] via SUBCUTANEOUS
  Administered 2018-09-10: 2 [IU] via SUBCUTANEOUS
  Administered 2018-09-11: 1 [IU] via SUBCUTANEOUS
  Administered 2018-09-11: 2 [IU] via SUBCUTANEOUS

## 2018-09-07 NOTE — Consult Note (Signed)
Brandon Hall  WGN:[id number] DOB: Nov 11, 1956 DOA: 09/06/2018 PCP: Dettinger, Elige Radon, MD    LOS: 1 day   Reason for Consult / Chief Complaint:  HTN  Consulting MD and date:  Pollyann Kennedy 09/07/18  HPI/Summary of hospital stay:  Brandon Hall is a 62 y.o. 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 He presented to [facility] on 09/06/18 for elective salvage laryngectomy / neck dissection due to difficulty swallowing and aspirating given persistent tumor despite radiation therapy for laryngeal CA / SCC of head and neck.  He was taken to OR and had direct laryngoscopy with frozen section biopsy, total laryngectomy, and modified neck dissection.  Post OR, he was extubated in PACU and then transferred to the ICU.  While in ICU, he had hypertension with SBP in 180's; therefore, PCCM was asked to see in consultation.  Subjective:  Awake and watching TV.  No complaints.  Objective   Blood pressure 140/78, pulse 63, temperature (!) 97.1 F (36.2 C), temperature source Axillary, resp. rate 12, height 5\' 9"  (1.753 m), weight 51.2 kg, SpO2 98 %.        Intake/Output Summary (Last 24 hours) at 09/07/2018 0146 Last data filed at 09/07/2018 0000 Gross per 24 hour  Intake 3010.67 ml  Output 610 ml  Net 2400.67 ml   Filed Weights   09/06/18 0914 09/06/18 0941  Weight: 53.1 kg 51.2 kg    Examination: General: Adult male, chronically ill appearing, resting in bed, in NAD. Neuro: A&O x 3, attempts to mouth words, non-focal. HEENT: Neck incision with staples C/D/I.  2 JP drains to anterior neck.  Old trach site noted.  Sclerae anicteric, EOMI.  Right eye blind. Cardiovascular: RRR, no M/R/G.  Lungs:  Respirations even and unlabored.  CTA bilaterally, No W/R/R.  Abdomen: PEG C/D/I.  BS x 4, soft, NT/ND.  Musculoskeletal: No gross deformities, no edema.  Skin: Intact, warm, no rashes.  Consults: date of consult/date signed off & final recs:  PCCM 09/07/18  Procedures: OR 9/9 > direct laryngoscopy with frozen section biopsy, total laryngectomy, and modified neck dissection  Significant Diagnostic Tests: None.  Micro Data: None.  Antimicrobials:  Unasyn 9/9 >    Resolved Hospital Problem list    Assessment & Plan:   Hypertensive urgency. Plan: Hydralazine PRN for SBP > 170. If needs additional control, might need cardene gtt. Continue preadmission HCTZ.  Persistent laryngeal tumor despite radiation therapy for laryngeal SCC - now s/p salvagetotal laryngectomy,and modified neck dissection (Dr. Pollyann Kennedy). Plan: Post op care per ENT.  Hx hypothyroidism. Plan: Continue synthroid.  Hyperglycemia - no hx DM. Plan: SSI.  Rest per primary team.  Disposition / Summary of Today's Plan 09/07/18   ICU under ENT service. Hydralazine PRN ordered for SBP > 170.   Labs   CBC: No results for input(s): WBC, NEUTROABS, HGB, HCT, MCV, PLT in the last 168 hours. Basic Metabolic Panel: No results for input(s): NA, K, CL, CO2, GLUCOSE, BUN, CREATININE, CALCIUM, MG, PHOS in the last 168 hours. GFR: Estimated Creatinine Clearance: 66 mL/min (by C-G formula based on SCr of 0.83 mg/dL). No results for input(s): PROCALCITON, WBC, LATICACIDVEN in the last 168 hours. Liver Function Tests: No results for input(s): AST, ALT, ALKPHOS, BILITOT, PROT, ALBUMIN in the  last 168 hours. No results for input(s): LIPASE, AMYLASE in the last 168 hours. No results for input(s): AMMONIA in the last 168 hours. ABG No results found for: PHART, PCO2ART, PO2ART, HCO3, TCO2, ACIDBASEDEF, O2SAT  Coagulation Profile: Recent Labs  Lab 09/06/18 0940  INR 1.07   Cardiac Enzymes: No results for input(s):  CKTOTAL, CKMB, CKMBINDEX, TROPONINI in the last 168 hours. HbA1C: No results found for: HGBA1C CBG: Recent Labs  Lab 09/06/18 1941 09/06/18 2329  GLUCAP 186* 228*     Review of Systems:   All negative; except for those that are bolded, which indicate positives.  Constitutional: weight loss, weight gain, night sweats, fevers, chills, fatigue, weakness.  HEENT: headaches, sore throat, sneezing, nasal congestion, post nasal drip, difficulty swallowing, tooth/dental problems, visual complaints, visual changes, ear aches. Neuro: difficulty with speech, weakness, numbness, ataxia. CV:  chest pain, orthopnea, PND, swelling in lower extremities, dizziness, palpitations, syncope.  Resp: cough, hemoptysis, dyspnea, wheezing. GI: heartburn, indigestion, abdominal pain, nausea, vomiting, diarrhea, constipation, change in bowel habits, loss of appetite, hematemesis, melena, hematochezia.  GU: dysuria, change in color of urine, urgency or frequency, flank pain, hematuria. MSK: joint pain or swelling, decreased range of motion. Psych: change in mood or affect, depression, anxiety, suicidal ideations, homicidal ideations. Skin: rash, itching, bruising.   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   Surgical History    Past Surgical History:  Procedure Laterality Date  . COLONOSCOPY  10/20/2011   Procedure: COLONOSCOPY;  Surgeon: Corbin Ade, MD;  [facility];  Service: Endoscopy;  Laterality: N/A;  1:00 pm  . COLONOSCOPY N/A 02/03/2018   Procedure: COLONOSCOPY;  Surgeon: Corbin Ade, MD;  [facility];  Service: Endoscopy;  Laterality: N/A;   2:15  . CRYOABLATION N/A 04/07/2014   Procedure: SALVAGE CRYO ABLATION PROSTATE;  Surgeon: Su Grand, MD;  Location: [facility];  Service: Urology;  Laterality: N/A;  . IR GASTROSTOMY TUBE MOD SED  08/20/2018  . IR GENERIC HISTORICAL  03/27/2017   IR REMOVAL TUN ACCESS W/ PORT W/O FL MOD SED [facility]-INTERV RAD  . LARYNGOSCOPY    . LYMPH NODE BIOPSY       Social History   Social History   Socioeconomic History  . Marital status: Married    Spouse name: Not on file  . Number of children: Not on file  . Years of education: Not on file  . Highest education level: Not on file  Occupational History  . Occupation: disabled  Social Needs  . Financial resource strain: Not very hard  . Food insecurity:    Worry: Never true    Inability: Never true  . Transportation needs:    Medical: No    Non-medical: No  Tobacco Use  . Smoking status: Former Smoker    Packs/day: 1.00    Years: 40.00    Pack years: 40.00    Types: Cigarettes    Last attempt to quit: 09/29/2013    Years since quitting: 4.9  . Smokeless tobacco: Never Used  Substance and Sexual Activity  . Alcohol use: No  . Drug use: No  . Sexual activity: Not on file  Lifestyle  . Physical activity:  Days per week: 7 days    Minutes per session: 60 min  . Stress: Only a little  Relationships  . Social connections:    Talks on phone: More than three times a week    Gets together: More than three times a week    Attends religious service: More than 4 times per year    Active member of club or organization: Yes    Attends meetings of clubs or organizations: More than 4 times per year    Relationship status: Married  . Intimate partner violence:    Fear of current or ex partner: No    Emotionally abused: No    Physically abused: No    Forced sexual activity: No  Other Topics Concern  . Not on file  Social History Narrative   Lives with wife in an apartment. He has stepchildren. They walk most everywhere  they go locally but have access to transportation if needed.  ,  reports that he quit smoking about 4 years ago. His smoking use included cigarettes. He has a 40.00 pack-year smoking history. He has never used smokeless tobacco. He reports that he does not drink alcohol or use drugs.   Family history   His family history includes Colon cancer in his brother, brother, and father; Hypertension in his mother.   Allergies No Known Allergies  Home meds  Prior to Admission medications   Medication Sig Start Date End Date Taking? Authorizing Provider  Amino Acids-Protein Hydrolys (FEEDING SUPPLEMENT, PRO-STAT SUGAR FREE 64,) LIQD Take 30 mLs by mouth daily. Patient taking differently: Place 30 mLs into feeding tube daily.  08/25/18 09/24/18 Yes Serena Colonel, MD  hydrochlorothiazide (HYDRODIURIL) 12.5 MG tablet TAKE 1 TABLET EVERY DAY 09/03/18  Yes Dettinger, Elige Radon, MD  levothyroxine (SYNTHROID, LEVOTHROID) 100 MCG tablet Place 1 tablet (100 mcg total) into feeding tube daily before breakfast. 08/25/18 09/24/18 Yes Serena Colonel, MD  Nutritional Supplements (FEEDING SUPPLEMENT, JEVITY 1.5 CAL/FIBER,) LIQD Place 325 mLs into feeding tube 4 (four) times daily. 08/24/18 09/23/18 Yes Serena Colonel, MD  phenol (CHLORASEPTIC) 1.4 % LIQD Use as directed 1 spray in the mouth or throat as needed for throat irritation / pain.   Yes [provider]  polyethylene glycol (MIRALAX / GLYCOLAX) packet Place 17 g into feeding tube daily.    Yes [provider]  pravastatin (PRAVACHOL) 40 MG tablet Take 1 tablet (40 mg total) by mouth daily. Patient taking differently: Place 40 mg into feeding tube daily.  04/27/18  Yes Dettinger, Elige Radon, MD  Vitamin D, Ergocalciferol, (DRISDOL) 50000 units CAPS capsule TAKE 1 CAPSULE EVERY 7 DAYS. Patient taking differently: Place 50,000 Units into feeding tube every Saturday.  04/27/18  Yes Dettinger, Elige Radon, MD      Rutherford Guys, PA - [facility] Pager: [phone]  or [phone] 09/07/2018, 1:46 AM

## 2018-09-07 NOTE — Patient Outreach (Signed)
[facility]) Care Management  09/07/2018  Brandon Hall 23-Jul-1956 [id number]   Care coordination  [facility] RN CM completed [facility] multidisciplinary template [facility] RN CM noted pt admitted on 09/06/18 for laryngeal surgery and biopsy by Dr Constance Holster. Experienced hypotensive emergency with a stay in ICU prior to transfer to med-surgical floor on 09/07/18  [facility] RN CM will review pt during [facility] multidisciplinary case discussion and follow up with Brandon Hall within 7 business days   Kelly L. Lavina Hamman, RN, BSN, [place] Coordinator Office number [phone] Mobile number [phone]  Main [facility] number [phone] Fax number [phone]

## 2018-09-07 NOTE — Progress Notes (Signed)
eLink Physician-Brief Progress Note Patient Name: Shanna Strength DOB: 1956/01/06 MRN: [id number]   Date of Service  09/07/2018  HPI/Events of Note  Hypertension - BP = 182/67.  eICU Interventions  Will order: 1. Hydralazine 10 mg IV Q 4 hours PRN SBP > 170 or DBP > 100.     Intervention Category Major Interventions: Hypertension - evaluation and management  Sommer,Steven Eugene 09/07/2018, 1:41 AM

## 2018-09-07 NOTE — Progress Notes (Signed)
Initial Nutrition Assessment  DOCUMENTATION CODES:   Severe malnutrition in context of chronic illness, Underweight  INTERVENTION:   Continue current TF regimen:  Jevity 1.5 325 ml QID  Pro-stat 30 ml once daily  When IVF d/c, recommend add free water flushes 150 ml via PEG before and 150 ml after each feeding bolus.   NUTRITION DIAGNOSIS:   Severe Malnutrition related to chronic illness(laryngeal cancer) as evidenced by severe muscle depletion, severe fat depletion, percent weight loss(15% weight loss within 3 months).  GOAL:   Patient will meet greater than or equal to 90% of their needs  MONITOR:   Labs, Weight trends, TF tolerance, Skin, I & O's  REASON FOR ASSESSMENT:   Malnutrition Screening Tool, New TF    ASSESSMENT:   62 yo male with PMH of R eye blindness, HTN, laryngeal cancer s/p XRT with persistent tumor, and recent PEG placement who was admitted on 9/9 for salvage total laryngectomy and R neck dissection.  Discussed patient with RN today. He is being transferred to med surg unit this morning. RN reports bolus feeding via PEG went well this morning, patient tolerated without difficulty.  Per review of chart, patient recently had a PEG placed (08/20/18) due to inability to swallow resulting in weight loss, FTT.   Current TF order: Jevity 1.5 325 ml QID with Pro-stat 30 ml via tube daily to provide 2050 kcal, 98 gm protein, 988 ml free water daily  IVF: D5NS at 50 ml/h providing 1200 ml per day  Labs reviewed. CBG's: 148-111 Medications reviewed and include HCTZ, Novolog, Miralax, vitamin D.  Weight down by 15% within the past 3 months; 60.1 kg 6/19 --> 51.2 kg today.   NUTRITION - FOCUSED PHYSICAL EXAM:    Most Recent Value  Orbital Region  Severe depletion  Upper Arm Region  Severe depletion  Thoracic and Lumbar Region  Moderate depletion  Buccal Region  Severe depletion  Temple Region  Severe depletion  Clavicle Bone Region  Severe depletion   Clavicle and Acromion Bone Region  Severe depletion  Scapular Bone Region  Severe depletion  Dorsal Hand  Severe depletion  Patellar Region  Severe depletion  Anterior Thigh Region  Severe depletion  Posterior Calf Region  Severe depletion  Edema (RD Assessment)  None  Hair  Reviewed  Eyes  Reviewed  Mouth  Reviewed  Skin  Reviewed  Nails  Reviewed       Diet Order:   Diet Order            Diet NPO time specified  Diet effective now              EDUCATION NEEDS:   No education needs have been identified at this time  Skin:  Skin Assessment: Skin Integrity Issues: Skin Integrity Issues:: Incisions Incisions: neck  Last BM:  PTA  Height:   Ht Readings from Last 1 Encounters:  09/06/18 5\' 9"  (1.753 m)    Weight:   Wt Readings from Last 1 Encounters:  09/06/18 51.2 kg    Ideal Body Weight:  72.7 kg  BMI:  Body mass index is 16.66 kg/m.  Estimated Nutritional Needs:   Kcal:  1900-2100  Protein:  95-110 gm  Fluid:  >/= 1.8 L    Molli Barrows, RD, LDN, [place] Pager [phone] After Hours Pager [phone]

## 2018-09-07 NOTE — Progress Notes (Signed)
Attempt to call report nurse will call back. Modena Morrow E, RN 09/07/2018 9:56 AM

## 2018-09-07 NOTE — Progress Notes (Signed)
Spoke with patient's son Darnelle Maffucci who will update his mother about patient's new room on 6N14. Report has been called and will transport shortly. Modena Morrow E, RN 09/07/2018 10:12 AM

## 2018-09-07 NOTE — Progress Notes (Signed)
Patient ID: Brandon Hall, male   DOB: [birth year], 62 y.o.   MRN: [id number] Subjective: Asleep on rounds but easily arousable.  He is breathing very comfortably.  He has no complaints.  He has some trouble with hypertension last night but was treated with medication and morphine.  Objective: Vital signs in last 24 hours: Temp:  [97.1 F (36.2 C)-97.7 F (36.5 C)] 97.7 F (36.5 C) (09/10 0709) Pulse Rate:  [50-92] 61 (09/10 0800) Resp:  [11-22] 13 (09/10 0800) BP: (102-158)/(56-89) 124/66 (09/10 0800) SpO2:  [91 %-100 %] 97 % (09/10 0800) Arterial Line BP: (125-177)/(51-71) 159/56 (09/10 0740) Weight:  [51.2 kg-53.1 kg] 51.2 kg (09/09 0941) Weight change:  Last BM Date: (PTA)  Intake/Output from previous day: 09/09 0701 - 09/10 0700 In: 4174 [I.V.:3214.1; NG/GT:650; IV Piggyback:299.9] Out: 1360 [Urine:1025; Drains:135; Blood:200] Intake/Output this shift: Total I/O In: 140 [I.V.:50; Other:90] Out: 100 [Urine:100]  PHYSICAL EXAM: Incisions intact, clean and dry, without any swelling.  JP drains are functioning nicely.  Stoma is widely patent clear and healthy.  Minimal secretions.  Lab Results: Recent Labs    09/07/18 0506  WBC 7.9  HGB 9.9*  HCT 31.9*  PLT 233   BMET Recent Labs    09/07/18 0506  NA 135  K 3.9  CL 102  CO2 26  GLUCOSE 124*  BUN 10  CREATININE 0.64  CALCIUM 8.0*    Studies/Results: No results found.  Medications: I have reviewed the patient's current medications.  Assessment/Plan: Postop day 1, doing well.  Transfer out of ICU to the general floor.  Continue postoperative care.  Anticipate starting oral feeds in 2-3 weeks.  Start on gastrostomy feeds.  LOS: 1 day   Izora Gala 09/07/2018, 8:52 AM

## 2018-09-08 ENCOUNTER — Encounter (HOSPITAL_COMMUNITY): Payer: Self-pay | Admitting: Otolaryngology

## 2018-09-08 LAB — BASIC METABOLIC PANEL
Anion gap: 9 (ref 5–15)
BUN: 10 mg/dL (ref 8–23)
CHLORIDE: 101 mmol/L (ref 98–111)
CO2: 26 mmol/L (ref 22–32)
Calcium: 7.7 mg/dL — ABNORMAL LOW (ref 8.9–10.3)
Creatinine, Ser: 0.76 mg/dL (ref 0.61–1.24)
GFR calc non Af Amer: >60 mL/min (ref >60)
Glucose, Bld: 179 mg/dL — ABNORMAL HIGH (ref 70–99)
POTASSIUM: 3.6 mmol/L (ref 3.5–5.1)
SODIUM: 136 mmol/L (ref 135–145)

## 2018-09-08 LAB — CBC
HEMATOCRIT: 32.1 % — AB (ref 39.0–52.0)
HEMOGLOBIN: 9.8 g/dL — AB (ref 13.0–17.0)
MCH: 24.8 pg — ABNORMAL LOW (ref 26.0–34.0)
MCHC: 30.5 g/dL (ref 30.0–36.0)
MCV: 81.3 fL (ref 78.0–100.0)
Platelets: 223 10*3/uL (ref 150–400)
RBC: 3.95 MIL/uL — AB (ref 4.22–5.81)
RDW: 17 % — ABNORMAL HIGH (ref 11.5–15.5)
WBC: 7.3 10*3/uL (ref 4.0–10.5)

## 2018-09-08 LAB — GLUCOSE, CAPILLARY
GLUCOSE-CAPILLARY: 84 mg/dL (ref 70–99)
GLUCOSE-CAPILLARY: 183 mg/dL — AB (ref 70–99)
Glucose-Capillary: 132 mg/dL — ABNORMAL HIGH (ref 70–99)
Glucose-Capillary: 159 mg/dL — ABNORMAL HIGH (ref 70–99)
Glucose-Capillary: 78 mg/dL (ref 70–99)
Glucose-Capillary: 93 mg/dL (ref 70–99)

## 2018-09-08 MED ORDER — FREE WATER
200.0000 mL | Freq: Three times a day (TID) | Status: DC
  Administered 2018-09-08 – 2018-09-12 (×12): 200 mL

## 2018-09-08 NOTE — Progress Notes (Signed)
Patient ID: Brandon Hall, male   DOB: 10-09-1956, 62 y.o.   MRN: [id number] Subjective: Doing well, no complaints.  He is sitting up in the bed watching TV.  His wife is there.  Objective: Vital signs in last 24 hours: Temp:  [97.9 F (36.6 C)-98.5 F (36.9 C)] 98.5 F (36.9 C) (09/11 1015) Pulse Rate:  [81-93] 83 (09/11 1015) Resp:  [17-18] 18 (09/11 1015) BP: (92-109)/(58-66) 109/65 (09/11 1015) SpO2:  [95 %-99 %] 97 % (09/11 1015) Weight change:  Last BM Date: (PTA)  Intake/Output from previous day: 09/10 0701 - 09/11 0700 In: 2753.9 [I.V.:923.2; NG/GT:1095; IV Piggyback:440.7] Out: 290 [Urine:275; Drains:15] Intake/Output this shift: Total I/O In: -  Out: 600 [Urine:600]  PHYSICAL EXAM: He is awake and alert.  Breathing is clear.  Stoma is beautifully clean and patent.  Neck flaps look excellent.  Drains are functioning.  Tongue moves normally.  Lab Results: Recent Labs    09/07/18 0506 09/08/18 0604  WBC 7.9 7.3  HGB 9.9* 9.8*  HCT 31.9* 32.1*  PLT 233 223   BMET Recent Labs    09/07/18 0506 09/08/18 0604  NA 135 136  K 3.9 3.6  CL 102 101  CO2 26 26  GLUCOSE 124* 179*  BUN 10 10  CREATININE 0.64 0.76  CALCIUM 8.0* 7.7*    Studies/Results: No results found.  Medications: I have reviewed the patient's current medications.  Assessment/Plan: Postop day 2, total laryngectomy and neck dissection, doing great.  Continue gastrostomy feeds.  Monitor drain output and possibly removed tomorrow.  Anticipate discharge next week on gastrostomy feeds.  LOS: 2 days   Izora Gala 09/08/2018, 12:38 PM

## 2018-09-08 NOTE — Evaluation (Signed)
Speech Language Pathology Evaluation (S/P total Laryngectomy) Patient Details Name: Brandon Hall MRN: [id number] DOB: Feb 08, 1956 Today's Date: 09/08/2018 Time: 1129-1200 SLP Time Calculation (min) (ACUTE ONLY): 31 min  Problem List:  Patient Active Problem List   Diagnosis Date Noted  . Hypertensive urgency   . Status post laryngectomy 09/06/2018  . Protein-calorie malnutrition, severe 08/20/2018  . Laryngeal cancer (HCC) 08/19/2018  . Perianal abscess 08/10/2017  . Vitamin D deficiency 02/08/2015  . Squamous cell carcinoma of head and neck (HCC) 07/12/2014  . History of prostate cancer 07/12/2014  . Shingles rash 02/03/2014  . Abnormal transaminases 10/02/2013  . Hypothyroid 10/02/2013  . Blind right eye 09/30/2013  . Dyslipidemia 09/30/2013  . Hypertension   . Graves disease    Past Medical History:  Past Medical History:  Diagnosis Date  . Arthritis   . Blindness of right eye   . Cancer (HCC)    pharyngeal ca  . History of radiation therapy 11/2014   [id number] squamous cell carcinoma of L neck, Stage IVB, unknown primary 70 Gy in 35 fractions treated at [facility], finished 12/25/14  . History of shingles    RASH  02-03-2014  PER PCP RESOLVING  . Hypertension    NO MEDS ,  MONITORED BY PCP  . Hypothyroidism, postradioiodine therapy   . Prostate cancer (HCC) 07/12/2014   DX  SEVERAL  YRS   AGO  WITH RADIATION THERAPY  . Squamous cell carcinoma of head and neck (HCC) 07/12/2014  . 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 COLONOSCOPY  10/20/2011   Procedure: COLONOSCOPY;  Surgeon: Corbin Ade, MD;  [facility];  Service: Endoscopy;  Laterality: N/A;  1:00 pm  . COLONOSCOPY N/A 02/03/2018   Procedure: COLONOSCOPY;  Surgeon: Corbin Ade, MD;  [facility];  Service: Endoscopy;  Laterality: N/A;  2:15  . CRYOABLATION N/A 04/07/2014   Procedure: SALVAGE  CRYO ABLATION PROSTATE;  Surgeon: Su Grand, MD;  Location: [facility];  Service: Urology;  Laterality: N/A;  . IR GASTROSTOMY TUBE MOD SED  08/20/2018  . IR GENERIC HISTORICAL  03/27/2017   IR REMOVAL TUN ACCESS W/ PORT W/O FL MOD SED [facility]-INTERV RAD  . LARYNGOSCOPY    . LYMPH NODE BIOPSY     HPI:  61 yr old admitted for total laryngectomy/neck dissection. History of laryngeal cancer, status post radiation therapy persistent tumor and having difficulty swallowing, aspirating with significant malnutrition, weight loss. Underwent surgery 09/06/18.   Assessment / Plan / Recommendation Clinical Impression  Pt seen for initial assessment s/p laryngectomy 09/06/18 re: education, oral-motor evaluation and introduction to electrolayrnx. Through visual images SLP provided education re: anatomical changes post surgery,desensitization of smell and taste, increased saliva pt presently experiencing. Therapist gave verbal instructions and demonstrated use of electolaryx using oral adapter to prevent direct contact of device on surgical site. He demonstated significantly decreased initiation and movement of lips and tongue when mouthing words. Max verbal cues and demonstration for exagerated and overarticulated oral movements however he showed significant difficulty performing perhaps due to baseline cognitive deficits (?), pain or increased saliva and need for prior suctioning. Pt's wife arrived and educated briefly re: purpose of assessment and use with electrolarynx- will need more education. ST will continue for interventin with Speech Pathology department's electrolayrnx, will provide pt with Self Help for the Laryngectomy book and contact case manager re; process initiated  for ordering pt's electrolarynx.    SLP Assessment  SLP Recommendation/Assessment: Patient needs continued Speech Lanaguage Pathology Services SLP Visit Diagnosis: Aphonia (R49.1)    Follow Up Recommendations  Home health SLP     Frequency and Duration min 2x/week  2 weeks      SLP Evaluation Cognition  Overall Cognitive Status: (uncertain of baseline cognition) Arousal/Alertness: Awake/alert       Comprehension  Visual Recognition/Discrimination Discrimination: Not tested Reading Comprehension Reading Status: Not tested    Expression Expression Primary Mode of Expression: Nonverbal - gestures Verbal Expression Overall Verbal Expression: Other (comment)(see impression statement) Written Expression Written Expression: Not tested   Oral / Motor  Oral Motor/Sensory Function Overall Oral Motor/Sensory Function: Other (comment)(difficulty articulating ?increased saliva? cognitive?) Motor Speech Overall Motor Speech: Other (comment)(see impression statement)   GO                    Royce Macadamia 09/08/2018, 1:56 PM   Breck Coons Lonell Face.Ed Nurse, children's [phone] Office [phone]

## 2018-09-08 NOTE — Care Management Note (Signed)
Case Management Note  Patient Details  Name: Brandon Hall MRN: [id number] Date of Birth: 08-12-1956  Subjective/Objective:                    Action/Plan:  Patient from home with wife and tube feeds through [place].   SP recommending home health SP. Will need resumption of care [facility] order and HHSP order and face to face. Expected Discharge Date:                  Expected Discharge Plan:  [place]  In-House Referral:     Discharge planning Services  CM Consult  Post Acute Care Choice:    Choice offered to:  Patient, Spouse  DME Arranged:    DME Agency:     HH Arranged:    Central Agency:     Status of Service:  In process, will continue to follow  If discussed at Long Length of Stay Meetings, dates discussed:    Additional Comments:  Marilu Favre, RN 09/08/2018, 3:32 PM

## 2018-09-08 NOTE — Consult Note (Addendum)
[name] CM Inpatient Consult   09/08/2018  Brandon Hall Oct 14, 1956 [id number]  Patient is currently active with [facility] Care Management for chronic disease management services.  Patient has been engaged by a [facility].  Our community based plan of care has focused on disease management and community resource support.   Chart review reveals per MD notes a 62 yr old male with Laryngeal mass is POD 1 s/p direct laryngoscopy biopsy with frozen section, Total laryngectomy (with right neck dissection level 2.3&4) - mass consistent with squamous cell carcinoma abs of tongue negative for carcinoma. Will continue to follow for progress and disposition.    Charlesetta Shanks, RN BSN CCM [facility]  [phone] business mobile phone Toll free office [phone]

## 2018-09-09 LAB — GLUCOSE, CAPILLARY
GLUCOSE-CAPILLARY: 76 mg/dL (ref 70–99)
GLUCOSE-CAPILLARY: 106 mg/dL — AB (ref 70–99)
GLUCOSE-CAPILLARY: 105 mg/dL — AB (ref 70–99)
Glucose-Capillary: 100 mg/dL — ABNORMAL HIGH (ref 70–99)
Glucose-Capillary: 133 mg/dL — ABNORMAL HIGH (ref 70–99)
Glucose-Capillary: 96 mg/dL (ref 70–99)

## 2018-09-09 NOTE — Consult Note (Signed)
[facility] CM Inpatient Consult   09/09/2018  Brandon Hall Dec 22, 1956 [id number]  Follow up:  Patient at the bedside, nonverbal and no family present.  Patient nods head 'yes' to ongoing [facility] Care Management.  Will assign community RNCM for follow up.  Charlesetta Shanks, RN BSN CCM [facility]  [phone] business mobile phone Toll free office [phone]

## 2018-09-09 NOTE — Care Management Important Message (Signed)
Important Message  Patient Details  Name: Brandon Hall MRN: [id number] Date of Birth: Nov 21, 1956   Medicare Important Message Given:  Yes    Adolf Ormiston Montine Circle 09/09/2018, 4:04 PM

## 2018-09-09 NOTE — Care Management Note (Addendum)
Case Management Note  Patient Details  Name: Brandon Hall MRN: [id number] Date of Birth: [birth year]  Subjective/Objective:                    Action/Plan:HHRN and SP arranged with [facility].  Discussed discharge planning with patient and wife at bedside. Electrolarynx paperwork given and explained to both. Patient and family must complete provide proof of address and income and mail to address on application. Explained Speech Therapy section is on patient chart. Once SP completes their section of application SP will give it to patient to mail with patient's section of application. Both voiced understanding.   Patient from home with TF through [facility] and [facility] through [facility]. Patient wants same [facility] post discharge. Will request orders and face to face and contact [place].   Bedside nurse providing care of  stoma education .   Expected Discharge Date:                  Expected Discharge Plan:  [place]  In-House Referral:     Discharge planning Services  CM Consult  Post Acute Care Choice:    Choice offered to:  Patient, Spouse  DME Arranged:    DME Agency:     HH Arranged:    [place] Agency:     Status of Service:  In process, will continue to follow  If discussed at Long Length of Stay Meetings, dates discussed:    Additional Comments:  Marilu Favre, RN 09/09/2018, 11:40 AM

## 2018-09-09 NOTE — Progress Notes (Signed)
Speech Language Pathology Treatment: (communication with electrolarynx)  Patient Details Name: Brandon Hall MRN: [id number] DOB: Oct 14, 1956 Today's Date: 09/09/2018 Time: 4098-1191 SLP Time Calculation (min) (ACUTE ONLY): 29 min  Assessment / Plan / Recommendation Clinical Impression  Continued education today with electrolarynx and continued education with pt and wife. Provided the Self Help for the laryngectomy book and reviewed anatomy (with illustrations) pre and post procedure. Introduced Fish farm manager of a voice prosthesis, answered questions and encouraged wife to inquire with MD. Increased lingual/labial ROM with decreased oral secretions present. SLP assisted with manual placement of adapter lateral to the tongue for optimal intelligibility. He exhibited decreased movement of articulators and SLP accurately identified single phoneme of previously unknown content to listener (wife's name /m/ in [place]). Other attempts with counting etc were not approximated to correct phoneme. Appreciate case manager initiating ordering of pt's electrolarynx. Will continue tx. Pt would benefit from home health ST.   HPI HPI: 62 yr old admitted for total laryngectomy/neck dissection. History of laryngeal cancer, status post radiation therapy persistent tumor and having difficulty swallowing, aspirating with significant malnutrition, weight loss. Underwent surgery 09/06/18.      SLP Plan  Continue with current plan of care       Recommendations                   Oral Care Recommendations: Oral care BID Follow up Recommendations: Home health SLP SLP Visit Diagnosis: Aphonia (R49.1) Plan: Continue with current plan of care       GO                Royce Macadamia 09/09/2018, 3:21 PM  Breck Coons Lonell Face.Ed Nurse, children's [phone] Office [phone]

## 2018-09-09 NOTE — Progress Notes (Signed)
Patient ID: Brandon Hall, male   DOB: 05-01-1956, 62 y.o.   MRN: [id number] Subjective: No new complaints.  Doing well.  Objective: Vital signs in last 24 hours: Temp:  [98 F (36.7 C)-98.5 F (36.9 C)] 98.2 F (36.8 C) (09/12 0446) Pulse Rate:  [80-110] 87 (09/12 0446) Resp:  [16-18] 16 (09/12 0446) BP: (97-109)/(59-72) 100/72 (09/12 0446) SpO2:  [97 %-100 %] 99 % (09/12 0446) Weight change:  Last BM Date: 09/08/18  Intake/Output from previous day: 09/11 0701 - 09/12 0700 In: 525  Out: 1560 [Urine:1550; Drains:10] Intake/Output this shift: No intake/output data recorded.  PHYSICAL EXAM: Stoma widely patent, clear, healthy, no secretions or crusting.  Flaps look excellent.  JP drains removed.  Lab Results: Recent Labs    09/07/18 0506 09/08/18 0604  WBC 7.9 7.3  HGB 9.9* 9.8*  HCT 31.9* 32.1*  PLT 233 223   BMET Recent Labs    09/07/18 0506 09/08/18 0604  NA 135 136  K 3.9 3.6  CL 102 101  CO2 26 26  GLUCOSE 124* 179*  BUN 10 10  CREATININE 0.64 0.76  CALCIUM 8.0* 7.7*    Studies/Results: No results found.  Medications: I have reviewed the patient's current medications.  Assessment/Plan: Stable postop, progressing nicely.  Continue care.  LOS: 3 days   Brandon Hall 09/09/2018, 9:03 AM

## 2018-09-09 NOTE — Progress Notes (Signed)
Nutrition Follow-up  DOCUMENTATION CODES:   Severe malnutrition in context of chronic illness, Underweight  INTERVENTION:   Continue current TF regimen:  Jevity 1.5 325 ml QID  Pro-stat 30 ml once daily  When IVF d/c, recommend add free water flushes 150 ml via PEG before and 150 ml after each feeding bolus.   NUTRITION DIAGNOSIS:   Severe Malnutrition related to chronic illness(laryngeal cancer) as evidenced by severe muscle depletion, severe fat depletion, percent weight loss(15% weight loss within 3 months).  Ongoing  GOAL:   Patient will meet greater than or equal to 90% of their needs  Met with TF  MONITOR:   Labs, Weight trends, TF tolerance, Skin, I & O's  REASON FOR ASSESSMENT:   Malnutrition Screening Tool, New TF    ASSESSMENT:   62 yo male with PMH of R eye blindness, HTN, laryngeal cancer s/p XRT with persistent tumor, and recent PEG placement who was admitted on 9/9 for salvage total laryngectomy and R neck dissection.  8/23- s/p PEG placement due to inability to swallow and FTT  Pt sitting up in recliner chair, speaking with nursing director at time of visit. He is pleasant and in good spirits today.   Case discussed with RN, who reports pt is tolerating TF regimen well. Pt complained of hunger prior to bolus feeding this AM per RN. Pt receiving bolus feedings of Jevity 1.5 325 ml QID with Pro-stat 30 ml via tube daily to provide 2050 kcal, 98 gm protein, 988 ml free water daily, which meets 100% of estimated nutritional needs.   Labs reviewed: CBGS: 76-183 (inpatient orders for glycemic control are 0-9 units insulin aspart every 4 hours).   Diet Order:   Diet Order            Diet NPO time specified  Diet effective now              EDUCATION NEEDS:   No education needs have been identified at this time  Skin:  Skin Assessment: Skin Integrity Issues: Skin Integrity Issues:: Incisions Incisions: neck  Last BM:  09/08/18  Height:    Ht Readings from Last 1 Encounters:  09/06/18 '5\' 9"'$  (1.753 m)    Weight:   Wt Readings from Last 1 Encounters:  09/06/18 51.2 kg    Ideal Body Weight:  72.7 kg  BMI:  Body mass index is 16.66 kg/m.  Estimated Nutritional Needs:   Kcal:  1900-2100  Protein:  95-110 gm  Fluid:  >/= 1.8 L    Quinntin Malter A. Jimmye Norman, RD, LDN, CDE Pager: [phone] After hours Pager: [phone]

## 2018-09-10 ENCOUNTER — Ambulatory Visit: Payer: Self-pay | Admitting: *Deleted

## 2018-09-10 ENCOUNTER — Encounter (HOSPITAL_COMMUNITY): Payer: Self-pay | Admitting: *Deleted

## 2018-09-10 LAB — GLUCOSE, CAPILLARY
GLUCOSE-CAPILLARY: 80 mg/dL (ref 70–99)
Glucose-Capillary: 79 mg/dL (ref 70–99)
Glucose-Capillary: 160 mg/dL — ABNORMAL HIGH (ref 70–99)
Glucose-Capillary: 151 mg/dL — ABNORMAL HIGH (ref 70–99)
Glucose-Capillary: 148 mg/dL — ABNORMAL HIGH (ref 70–99)

## 2018-09-10 MED ORDER — DEXTROSE 50 % IV SOLN
25.0000 mL | Freq: Once | INTRAVENOUS | Status: AC
  Administered 2018-09-10: 25 mL via INTRAVENOUS
  Filled 2018-09-10: qty 50

## 2018-09-10 MED ORDER — HYDROCODONE-ACETAMINOPHEN 7.5-325 MG/15ML PO SOLN: 10.0000 mL | ORAL | 0 refills | Status: DC | PRN

## 2018-09-10 NOTE — Anesthesia Postprocedure Evaluation (Addendum)
Anesthesia Post Note  Patient: Brandon Hall  Procedure(s) Performed: DIRECT LARYNGOSCOPY biopsy with frozen section (N/A Throat) total LARYNGECTOMY (N/A Neck) Modified NECK DISSECTION (Right Neck)     Patient location during evaluation: PACU Anesthesia Type: General Level of consciousness: awake and patient cooperative Pain management: pain level controlled Vital Signs Assessment: post-procedure vital signs reviewed and stable Respiratory status: spontaneous breathing, nonlabored ventilation, respiratory function stable and patient connected to tracheostomy mask oxygen Cardiovascular status: blood pressure returned to baseline and stable Postop Assessment: no apparent nausea or vomiting Anesthetic complications: no    Last Vitals:  Vitals:   09/10/18 0001 09/10/18 0350  BP: 107/75 122/76  Pulse: 84 81  Resp: 20 18  Temp: 36.7 C 37.3 C  SpO2: 98% 98%    Last Pain:  Vitals:   09/10/18 0350  TempSrc: Oral  PainSc:                  Lakia Gritton

## 2018-09-10 NOTE — Progress Notes (Signed)
Hypoglycemic Event  CBG: 79  Treatment: D50 IV 25 mL  Symptoms: None  Follow-up CBG: EKBT:2481 CBG Result:161  Possible Reasons for Event: Inadequate meal intake  Comments/MD notified:protocol followed    Brandon Hall Meda Klinefelter

## 2018-09-10 NOTE — Discharge Instructions (Signed)
Do not eat or drink anything by mouth.  Continue tube feeds.  Apply antibiotic ointment along the staples twice daily.  Clean the stoma of any scabs or crusting as needed.  Apply some ointment to the edges of the stoma twice daily.

## 2018-09-10 NOTE — Progress Notes (Signed)
Pt was resting around 1700 and suddenly he has difficulty of breathing  His trach was covered with his gown we reposition him, suction, given 02, rechecked his 02 sat back to 97%, charge nurse aware.

## 2018-09-10 NOTE — Progress Notes (Signed)
Patient ID: Brandon Hall, male   DOB: 1956-09-10, 62 y.o.   MRN: [id number] No new complaints.  Skin flaps look excellent.  Stoma widely patent without any crusty buildup.  He is breathing very nicely.  He is tolerating tube feeds.  Stable postop.  Continue hospital care.  Anticipate discharge on Monday.

## 2018-09-11 LAB — GLUCOSE, CAPILLARY
GLUCOSE-CAPILLARY: 74 mg/dL (ref 70–99)
GLUCOSE-CAPILLARY: 144 mg/dL — AB (ref 70–99)
Glucose-Capillary: 90 mg/dL (ref 70–99)
Glucose-Capillary: 159 mg/dL — ABNORMAL HIGH (ref 70–99)
Glucose-Capillary: 109 mg/dL — ABNORMAL HIGH (ref 70–99)

## 2018-09-11 MED ORDER — INFLUENZA VAC SPLIT QUAD 0.5 ML IM SUSY
0.5000 mL | PREFILLED_SYRINGE | INTRAMUSCULAR | Status: AC
  Administered 2018-09-12: 0.5 mL via INTRAMUSCULAR
  Filled 2018-09-11: qty 0.5

## 2018-09-11 NOTE — Progress Notes (Signed)
Received a referral to assist with a home suction machine and a humidifier. Per RN, pt has a stoma. Contacted Jermaine at [facility] for DME referral. He reports that they needed a 24 notice for this referral and they won't be able to f/u today. RN contacted Dr. Janace Hoard. Spoke to Dr. Janace Hoard and explained that [facility] won't be able to f/u with pt today, but he reports that pt is ready to be D/C today. Pt is active with [facility] for [facility]. Informed Dr. Janace Hoard that I will contact [facility] to see if they can f/u with pt today. Met with pt and wife and they agreed to use another [facility] agency if they can f/u today. Contacted Ashley at [facility] at [phone] and left a VM. Received call from [place] and he reports that they don't keep this DME in the office and it can take up to 24 hours to get it. Contacted [facility] at [phone] for DME referral and left a message with the receptionist. Received a call from Margaret and she reports that they don't have a portable suction machine available.   Met with Jermaine from [facility]. He reports that he contacted the manager for respiratory and they may be able to f/u tomorrow. Completed the DME referral in Epic. Dr. Janace Hoard needs to sign off the DME order.   RN notified Dr. Janace Hoard that I was not able to find another provider for the DME referral.   Met with pt's wife and made her aware of above.

## 2018-09-11 NOTE — Discharge Summary (Addendum)
Physician Discharge Summary  Patient ID: Brandon Hall MRN: [id number] DOB/AGE: [birth year] 62 y.o.  Admit date: 09/06/2018 Discharge date: 09/11/2018  Admission Diagnoses:squamous carcinoma the larynx  Discharge Diagnoses: same Active Problems:   Status post laryngectomy   Hypertensive urgency   Discharged Condition: good  Hospital Course: patient was admitted to the hospital for persistent recurrent disease of his larynx and underwent a total laryngectomy. He's done very well. He is postop day 5 he has no swelling or erythema. The stoma is wide open. He has no crusting of the stoma. He is feeding himself through his PEG tube. He has not had any breathing difficulty. He wants to go home today which seems to be appropriate. We will arrange a suction machine as well as humidifier through home health. He will follow-up in one week. Wound instructions were given.I have instructed the nurse to get the case manager to arrange home health to get the suction machine and humidifier for his stoma  Consults: None  Significant Diagnostic Studies: none  Treatments: surgery: as  Discharge Exam: Blood pressure 99/70, pulse 79, temperature 98.3 F (36.8 C), temperature source Oral, resp. rate 16, height 5\' 9"  (1.753 m), weight 51.2 kg, SpO2 99 %. patient is awake and alert. His neck looks excellent. Staples are intact. There is no evidence of a fistula or infection. His stoma is wide open with absolutely no crusting. Lungs are clear. Abdomen soft. Heart regular. Extremities with no edema or tenderness.  Disposition: Discharge disposition: 01-Home or Self Care       Discharge Instructions    Call MD for:  difficulty breathing, headache or visual disturbances   Complete by:  As directed    Call MD for:  extreme fatigue   Complete by:  As directed    Call MD for:  hives   Complete by:  As directed    Call MD for:  persistant dizziness or light-headedness   Complete by:  As directed    Call MD  for:  persistant nausea and vomiting   Complete by:  As directed    Call MD for:  redness, tenderness, or signs of infection (pain, swelling, redness, odor or green/yellow discharge around incision site)   Complete by:  As directed    Call MD for:  severe uncontrolled pain   Complete by:  As directed    Call MD for:  temperature >100.4   Complete by:  As directed    Diet - low sodium heart healthy   Complete by:  As directed    Discharge instructions   Complete by:  As directed    Call for an appointment in one week to follow-up in the office. Call if there is any increasing swelling, tenderness, or drainage from the wound. Also if it becomes more red. The stoma which is the hole in the neck for breathing should be cleanedwith a small amount of saline or hives or peroxide right around the edge to keep any crusting off of it. Right now it looks excellent and that's the way it should stay. A humidifier will be supplied at home that can be placed over the stoma to provide moisturized air. Continue to use her feeding tube as you have in the past.   Increase activity slowly   Complete by:  As directed      Allergies as of 09/11/2018   No Known Allergies     Medication List    TAKE these medications   feeding supplement (JEVITY  1.5 CAL/FIBER) Liqd Place 325 mLs into feeding tube 4 (four) times daily.   feeding supplement (PRO-STAT SUGAR FREE 64) Liqd Take 30 mLs by mouth daily. What changed:  how to take this   hydrochlorothiazide 12.5 MG tablet Commonly known as:  HYDRODIURIL TAKE 1 TABLET EVERY DAY   HYDROcodone-acetaminophen 7.5-325 mg/15 ml solution Commonly known as:  HYCET Place 10 mLs into feeding tube every 4 (four) hours as needed for moderate pain.   levothyroxine 100 MCG tablet Commonly known as:  SYNTHROID, LEVOTHROID Place 1 tablet (100 mcg total) into feeding tube daily before breakfast.   phenol 1.4 % Liqd Commonly known as:  CHLORASEPTIC Use as directed 1 spray  in the mouth or throat as needed for throat irritation / pain.   polyethylene glycol packet Commonly known as:  MIRALAX / GLYCOLAX Place 17 g into feeding tube daily.   pravastatin 40 MG tablet Commonly known as:  PRAVACHOL Take 1 tablet (40 mg total) by mouth daily. What changed:  how to take this   Vitamin D (Ergocalciferol) 50000 units Caps capsule Commonly known as:  DRISDOL TAKE 1 CAPSULE EVERY 7 DAYS. What changed:    how much to take  how to take this  when to take this  additional instructions      Follow-up Information    [facility] - Dme Follow up.   Contact information: [address] [place] [address] [phone]        Serena Colonel, MD. Schedule an appointment as soon as possible for a visit on 09/20/2018.   Specialty:  Otolaryngology Why:  For staple removal. Contact information: [address] [place] [address] [phone]           Signed: Suzanna Obey 09/11/2018, 8:53 AM

## 2018-09-12 DIAGNOSIS — Z23 Encounter for immunization: Secondary | ICD-10-CM | POA: Diagnosis not present

## 2018-09-12 LAB — GLUCOSE, CAPILLARY
GLUCOSE-CAPILLARY: 113 mg/dL — AB (ref 70–99)
GLUCOSE-CAPILLARY: 80 mg/dL (ref 70–99)
GLUCOSE-CAPILLARY: 85 mg/dL (ref 70–99)

## 2018-09-12 NOTE — Progress Notes (Signed)
Patient discharged to home with instructions, prescriptions and suction machine.

## 2018-09-12 NOTE — Progress Notes (Signed)
6 Days Post-Op   Subjective/Chief Complaint: Doing great   Objective: Vital signs in last 24 hours: Temp:  [97.5 F (36.4 C)-98.6 F (37 C)] 97.5 F (36.4 C) (09/15 0556) Pulse Rate:  [71-90] 71 (09/15 0556) Resp:  [16-18] 16 (09/15 0556) BP: (102-114)/(70-75) 114/75 (09/15 0556) SpO2:  [99 %] 99 % (09/15 0556) Last BM Date: 09/11/18  Intake/Output from previous day: No intake/output data recorded. Intake/Output this shift: No intake/output data recorded.  still without swelling or evidence of infection. stoma wide open with no crusting. l clear abd soft cv regular. ext no swelling or tenderness  Lab Results:  No results for input(s): WBC, HGB, HCT, PLT in the last 72 hours. BMET No results for input(s): NA, K, CL, CO2, GLUCOSE, BUN, CREATININE, CALCIUM in the last 72 hours. PT/INR No results for input(s): LABPROT, INR in the last 72 hours. ABG No results for input(s): PHART, HCO3 in the last 72 hours.  Invalid input(s): PCO2, PO2  Studies/Results: No results found.  Anti-infectives: Anti-infectives (From admission, onward)   Start     Dose/Rate Route Frequency Ordered Stop   09/06/18 1830  Ampicillin-Sulbactam (UNASYN) 3 g in sodium chloride 0.9 % 100 mL IVPB  Status:  Discontinued     3 g 200 mL/hr over 30 Minutes Intravenous Every 6 hours 09/06/18 1614 09/08/18 1241   09/06/18 1200  Ampicillin-Sulbactam (UNASYN) 3 g in sodium chloride 0.9 % 100 mL IVPB     3 g 200 mL/hr over 30 Minutes Intravenous  Once 09/06/18 1155 09/07/18 0043      Assessment/Plan: s/p Procedure(s): DIRECT LARYNGOSCOPY biopsy with frozen section (N/A) total LARYNGECTOMY (N/A) Modified NECK DISSECTION (Right) he could not be discharged yesterday because of home health. he is not using a suction or humdifier in hospital and looks great. with no crusting. will discharge and get him the supplies tomorrow. he does not appear to need them at all. . follow up this week in the office  LOS: 6  days    Melissa Montane 09/12/2018

## 2018-09-12 NOTE — Care Management Note (Signed)
Case Management Note  Patient Details  Name: Brandon Hall MRN: [id number] Date of Birth: Dec 04, 1956  Subjective/Objective:                    Action/Plan: MD did not feel patient needed suction for home for d/c. CM updated Jermaine with [facility] DME and he stated the suction machine was available and the patient could take it home with him. Jermaine delivered the DME to the room. [facility] aware of pts d/c for [facility] services.   Expected Discharge Date:  09/12/18               Expected Discharge Plan:  [place]  In-House Referral:     Discharge planning Services  CM Consult  Post Acute Care Choice:    Choice offered to:  Patient, Spouse  DME Arranged:    DME Agency:     HH Arranged:    [place] Agency:     Status of Service:  In process, will continue to follow  If discussed at Long Length of Stay Meetings, dates discussed:    Additional Comments:  Pollie Friar, RN 09/12/2018, 12:21 PM

## 2018-09-12 NOTE — Progress Notes (Signed)
Patient's suction equipment came and to be taken with the patient when he gets discharge.

## 2018-09-13 ENCOUNTER — Other Ambulatory Visit: Payer: Self-pay | Admitting: *Deleted

## 2018-09-13 ENCOUNTER — Other Ambulatory Visit: Payer: Self-pay

## 2018-09-13 DIAGNOSIS — E0500 Thyrotoxicosis with diffuse goiter without thyrotoxic crisis or storm: Secondary | ICD-10-CM | POA: Diagnosis not present

## 2018-09-13 DIAGNOSIS — C760 Malignant neoplasm of head, face and neck: Secondary | ICD-10-CM | POA: Diagnosis not present

## 2018-09-13 DIAGNOSIS — M1990 Unspecified osteoarthritis, unspecified site: Secondary | ICD-10-CM | POA: Diagnosis not present

## 2018-09-13 DIAGNOSIS — Z431 Encounter for attention to gastrostomy: Secondary | ICD-10-CM | POA: Diagnosis not present

## 2018-09-13 DIAGNOSIS — C329 Malignant neoplasm of larynx, unspecified: Secondary | ICD-10-CM | POA: Diagnosis not present

## 2018-09-13 DIAGNOSIS — Z930 Tracheostomy status: Secondary | ICD-10-CM | POA: Diagnosis not present

## 2018-09-13 DIAGNOSIS — E890 Postprocedural hypothyroidism: Secondary | ICD-10-CM | POA: Diagnosis not present

## 2018-09-13 DIAGNOSIS — E559 Vitamin D deficiency, unspecified: Secondary | ICD-10-CM | POA: Diagnosis not present

## 2018-09-13 DIAGNOSIS — H269 Unspecified cataract: Secondary | ICD-10-CM | POA: Diagnosis not present

## 2018-09-13 DIAGNOSIS — I10 Essential (primary) hypertension: Secondary | ICD-10-CM | POA: Diagnosis not present

## 2018-09-13 DIAGNOSIS — E43 Unspecified severe protein-calorie malnutrition: Secondary | ICD-10-CM | POA: Diagnosis not present

## 2018-09-13 DIAGNOSIS — R1310 Dysphagia, unspecified: Secondary | ICD-10-CM | POA: Diagnosis not present

## 2018-09-13 NOTE — Patient Outreach (Addendum)
[facility]) Care Management  09/13/2018  Kincade Allton 1956-02-06 [id number]  Transition of Care Referral and warm transfer to [facility] community care management for complex care management Date of Admission:  Diagnosis:  Date of Discharge: Facility: [facility] hospital  Insurance:  [employer] medicare   [facility] RN CM received in basket from [facility] hospital liaison, Mike Gip of referral to [facility] Community RN CM for New trach, HX of cancer. Outreach attempt # 1 successful to his wife Patient's wife, [name],  is able to verify HIPAA Reviewed and addressed Transitional of care and warm transfer to [facility] Community RN CM Spoke with [name] his wife who initially voices she left in room 14 of [facility] a green house coat with hole in middle - She states she called back herself and was informed they would look for it and return a call to her.  CM discussed CM would not be able to go to hospital to look for her house coat as request and the staff at the hospital would return a call to her.  [name] states if they had to discard it  would be okay"  [name] answered a call during the conversation with CM  [name] reports Mr Yokel is doing good but was in pain and she gave him pain medicine earlier so he is sleeping now [name] reports [facility] RN came in today 09/13/18 The staff assessed, spoke with pt and walked pt  [name] offered CM contact number for any further concerns and she asked CM to give the number to [facility] to [place],  Mr Montroy's family member who states she has been present with him since discharge and he is doing well. "I have been helping them a long time." CM reviewed conversation with [facility] about home health, the wound site, suction and follow up appointments. Cm asked [name] similar Transition of care questions and documented. She thanked CM for her call and voiced appreciation of services rendered   Social:  Mr Jahn lives at home with his wife, [name] and [name] provides support for them both as Mrs Mckaig has  visual issues and diabetes He is now needing assist with ADLs and iADLs. [name] assists with transportation to medical appointments and errands  Conditions: 08/19/18 to 08/24/18 admission for laryngeal cancer, severe protein calorie malnutrition, gastrostomy tube placement, HTN, graves disease, hypothyroid, blind in right eye, dyslipidemia, vitamin D deficiency, perianal abscess, hx of prostate cancer, shingles rash, abnormal transaminases  Medications: denies concerns with taking medications as prescribed, affording medications, side effects of medications and questions about medications   Appointments: Dr Museum/gallery exhibitions officer., primary MD follow up 09/14/18  Advance directives: Denies need for assist with or assist with changes for advance directives   Consent: [facility] RN CM reviewed [facility] services with patient. Patient gave verbal consent for services. Advised patient that other post discharge calls may occur to assess how the patient is doing following the recent hospitalization. Patient voiced understanding and was appreciative of f/u call.  Transition of care services noted to be completed by primary care MD office staff  Plan: [facility] RN CM called,spoke with Mrs Faron and [name] to complete a warm transfer to [facility] Community RN CM Telephonic RN CM EMMI case closure completed Left a message for community [facility] NP listed in Epic care team  [facility] RN CM sent a successful outreach letter as discussed with [facility] brochure enclosed for review   Cala Bradford L. Noelle Penner, RN, BSN, CCM [facility] Telephonic Care Management Care Coordinator Direct Number [phone])  [phone] Mobile number [phone]  Main [facility] number [phone] Fax number [phone]

## 2018-09-14 ENCOUNTER — Encounter: Payer: Self-pay | Admitting: Family Medicine

## 2018-09-14 ENCOUNTER — Ambulatory Visit: Payer: Self-pay | Admitting: *Deleted

## 2018-09-14 ENCOUNTER — Ambulatory Visit (INDEPENDENT_AMBULATORY_CARE_PROVIDER_SITE_OTHER): Payer: Medicare HMO | Admitting: Family Medicine

## 2018-09-14 ENCOUNTER — Other Ambulatory Visit: Payer: Self-pay | Admitting: Urology

## 2018-09-14 VITALS — BP 102/67 | HR 98 | Temp 97.2°F | Ht 69.0 in | Wt 119.6 lb

## 2018-09-14 DIAGNOSIS — D638 Anemia in other chronic diseases classified elsewhere: Secondary | ICD-10-CM

## 2018-09-14 DIAGNOSIS — M1990 Unspecified osteoarthritis, unspecified site: Secondary | ICD-10-CM | POA: Diagnosis not present

## 2018-09-14 DIAGNOSIS — C329 Malignant neoplasm of larynx, unspecified: Secondary | ICD-10-CM

## 2018-09-14 DIAGNOSIS — H269 Unspecified cataract: Secondary | ICD-10-CM | POA: Diagnosis not present

## 2018-09-14 DIAGNOSIS — R531 Weakness: Secondary | ICD-10-CM | POA: Diagnosis not present

## 2018-09-14 DIAGNOSIS — E43 Unspecified severe protein-calorie malnutrition: Secondary | ICD-10-CM

## 2018-09-14 DIAGNOSIS — E0500 Thyrotoxicosis with diffuse goiter without thyrotoxic crisis or storm: Secondary | ICD-10-CM | POA: Diagnosis not present

## 2018-09-14 DIAGNOSIS — C61 Malignant neoplasm of prostate: Secondary | ICD-10-CM

## 2018-09-14 DIAGNOSIS — E890 Postprocedural hypothyroidism: Secondary | ICD-10-CM | POA: Diagnosis not present

## 2018-09-14 DIAGNOSIS — Z431 Encounter for attention to gastrostomy: Secondary | ICD-10-CM | POA: Diagnosis not present

## 2018-09-14 DIAGNOSIS — R1310 Dysphagia, unspecified: Secondary | ICD-10-CM | POA: Diagnosis not present

## 2018-09-14 DIAGNOSIS — I10 Essential (primary) hypertension: Secondary | ICD-10-CM | POA: Diagnosis not present

## 2018-09-14 LAB — CBC WITH DIFFERENTIAL/PLATELET
BASOS: 1 %
Basophils Absolute: 0 10*3/uL (ref 0.0–0.2)
EOS (ABSOLUTE): 0 10*3/uL (ref 0.0–0.4)
Eos: 1 %
HEMATOCRIT: 40.2 % (ref 37.5–51.0)
Hemoglobin: 12.2 g/dL — ABNORMAL LOW (ref 13.0–17.7)
IMMATURE GRANULOCYTES: 1 %
Immature Grans (Abs): 0 10*3/uL (ref 0.0–0.1)
Lymphocytes Absolute: 1 10*3/uL (ref 0.7–3.1)
Lymphs: 18 %
MCH: 23.7 pg — AB (ref 26.6–33.0)
MCHC: 30.3 g/dL — ABNORMAL LOW (ref 31.5–35.7)
MCV: 78 fL — AB (ref 79–97)
MONOS ABS: 0.7 10*3/uL (ref 0.1–0.9)
Monocytes: 12 %
NEUTROS ABS: 3.9 10*3/uL (ref 1.4–7.0)
NEUTROS PCT: 67 %
PLATELETS: 398 10*3/uL (ref 150–450)
RBC: 5.15 x10E6/uL (ref 4.14–5.80)
RDW: 16.3 % — AB (ref 12.3–15.4)
WBC: 5.8 10*3/uL (ref 3.4–10.8)

## 2018-09-14 LAB — CMP14+EGFR
A/G RATIO: 1.3 (ref 1.2–2.2)
ALT: 33 IU/L (ref 0–44)
AST: 24 IU/L (ref 0–40)
Albumin: 4 g/dL (ref 3.6–4.8)
Alkaline Phosphatase: 77 IU/L (ref 39–117)
BILIRUBIN TOTAL: 0.3 mg/dL (ref 0.0–1.2)
BUN/Creatinine Ratio: 17 (ref 10–24)
BUN: 13 mg/dL (ref 8–27)
CALCIUM: 9.4 mg/dL (ref 8.6–10.2)
CO2: 26 mmol/L (ref 20–29)
Chloride: 90 mmol/L — ABNORMAL LOW (ref 96–106)
Creatinine, Ser: 0.77 mg/dL (ref 0.76–1.27)
GFR, EST AFRICAN AMERICAN: 112 mL/min/{1.73_m2} (ref >59)
GFR, EST NON AFRICAN AMERICAN: 97 mL/min/{1.73_m2} (ref >59)
Globulin, Total: 3 g/dL (ref 1.5–4.5)
Glucose: 91 mg/dL (ref 65–99)
POTASSIUM: 4.6 mmol/L (ref 3.5–5.2)
SODIUM: 134 mmol/L (ref 134–144)
TOTAL PROTEIN: 7 g/dL (ref 6.0–8.5)

## 2018-09-14 MED ORDER — HYDROCODONE-ACETAMINOPHEN 7.5-325 MG/15ML PO SOLN: 10.0000 mL | Freq: Four times a day (QID) | ORAL | 0 refills | Status: DC | PRN

## 2018-09-14 NOTE — Progress Notes (Signed)
BP 102/67   Pulse 98   Temp (!) 97.2 F (36.2 C) (Oral)   Ht 5\' 9"  (1.753 m)   Wt 119 lb 9.6 oz (54.3 kg)   BMI 17.66 kg/m    Subjective:    Patient ID: Brandon Hall, male    DOB: 1956-06-03, 62 y.o.   MRN: [id number]  HPI: Brandon Hall is a 62 y.o. male presenting on 09/14/2018 for Hospitalization Follow-up (9/9-9/15 Laryngeal cancer)   HPI Hospital follow-up for recurrent laryngeal cancer and malnutrition and anemia Patient is coming in for hospital follow-up for recurrent laryngeal cancer and status post laryngectomy and protein calorie malnutrition and patient was placed on feeding tube.  He does have some home health care coming through [facility] to help with this but his wife says that she cannot manage it anymore and that she needs more help on a daily basis rather than just 3 times a week only helping sometimes.  She says that he has been losing weight and is becoming more weak and he really cannot eat anything.  His breathing has been going fine and they have been using suction for his stoma on his neck.  The staples are still intact and has no signs of infection and he has follow-up with the surgeon to get this removed.  Patient was admitted on 09/06/2018 and discharged on 09/11/2018  Relevant past medical, surgical, family and social history reviewed and updated as indicated. Interim medical history since our last visit reviewed. Allergies and medications reviewed and updated.  Review of Systems  Constitutional: Negative for chills and fever.  HENT: Positive for trouble swallowing.   Eyes: Negative for visual disturbance.  Respiratory: Negative for shortness of breath and wheezing.   Cardiovascular: Negative for chest pain and leg swelling.  Gastrointestinal: Negative for constipation, diarrhea, nausea and vomiting.  Musculoskeletal: Negative for back pain and gait problem.  Skin: Positive for wound (Appears clean and well-healed). Negative for rash.  Neurological:  Negative for dizziness, weakness and light-headedness.  All other systems reviewed and are negative.   Per HPI unless specifically indicated above   Allergies as of 09/14/2018   No Known Allergies     Medication List        Accurate as of 09/14/18  9:20 AM. Always use your most recent med list.          feeding supplement (JEVITY 1.5 CAL/FIBER) Liqd Place 325 mLs into feeding tube 4 (four) times daily.   feeding supplement (PRO-STAT SUGAR FREE 64) Liqd Take 30 mLs by mouth daily.   hydrochlorothiazide 12.5 MG tablet Commonly known as:  HYDRODIURIL TAKE 1 TABLET EVERY DAY   levothyroxine 100 MCG tablet Commonly known as:  SYNTHROID, LEVOTHROID Place 1 tablet (100 mcg total) into feeding tube daily before breakfast.   phenol 1.4 % Liqd Commonly known as:  CHLORASEPTIC Use as directed 1 spray in the mouth or throat as needed for throat irritation / pain.   polyethylene glycol packet Commonly known as:  MIRALAX / GLYCOLAX Place 17 g into feeding tube daily.   pravastatin 40 MG tablet Commonly known as:  PRAVACHOL Take 1 tablet (40 mg total) by mouth daily.   Vitamin D (Ergocalciferol) 50000 units Caps capsule Commonly known as:  DRISDOL TAKE 1 CAPSULE EVERY 7 DAYS.          Objective:    BP 102/67   Pulse 98   Temp (!) 97.2 F (36.2 C) (Oral)   Ht 5\' 9"  (  1.753 m)   Wt 119 lb 9.6 oz (54.3 kg)   BMI 17.66 kg/m   Wt Readings from Last 3 Encounters:  09/14/18 119 lb 9.6 oz (54.3 kg)  09/06/18 112 lb 12.8 oz (51.2 kg)  08/27/18 117 lb 14.4 oz (53.5 kg)    Physical Exam  Constitutional: He is oriented to person, place, and time. He appears well-developed and well-nourished. No distress.  Eyes: Conjunctivae are normal. No scleral icterus.  Neck: Neck supple.  Patient has stoma site on trachea and appears to be open and mostly clean with minimal drainage.  Patient also has a smile-like scar on upper neck and staples appear intact with no erythema or warmth or  drainage  Cardiovascular: Normal rate, regular rhythm, normal heart sounds and intact distal pulses.  No murmur heard. Pulmonary/Chest: Effort normal and breath sounds normal. No respiratory distress. He has no wheezes.  Musculoskeletal: Normal range of motion. He exhibits no edema.  Lymphadenopathy:    He has no cervical adenopathy.  Neurological: He is alert and oriented to person, place, and time. Coordination normal.  Skin: Skin is warm and dry. No rash noted. He is not diaphoretic.  Psychiatric: He has a normal mood and affect. His behavior is normal.  Nursing note and vitals reviewed.       Assessment & Plan:   Problem List Items Addressed This Visit      Respiratory   Laryngeal cancer (HCC) - Primary   Relevant Orders   CBC with Differential/Platelet   CMP14+EGFR     Other   Protein-calorie malnutrition, severe   Relevant Orders   CMP14+EGFR    Other Visit Diagnoses    General weakness       Relevant Orders   CBC with Differential/Platelet   Anemia of chronic disease        Patient is currently getting some help but his wife wants to see if they can get more through [facility] because she is getting burned out.  He is doing feeding tubes 4 times a day per her.  She is having to suction and keep his laryngeal stoma open and clean.  We will do a face-to-face today to see if we can get more of a visiting nurse daily if possible through their insurance.  He has become weaker and is losing weight and she is having more difficulty taking care of him.  Patient has follow-up with surgeon on stoma site and staples from neck dissection  Follow up plan: Return in about 8 weeks (around 11/09/2018), or if symptoms worsen or fail to improve, for Hypertension and thyroid recheck.  Counseling provided for all of the vaccine components No orders of the defined types were placed in this encounter.   Arville Care, MD [facility] 09/14/2018,  9:20 AM

## 2018-09-15 DIAGNOSIS — H269 Unspecified cataract: Secondary | ICD-10-CM | POA: Diagnosis not present

## 2018-09-15 DIAGNOSIS — C329 Malignant neoplasm of larynx, unspecified: Secondary | ICD-10-CM | POA: Diagnosis not present

## 2018-09-15 DIAGNOSIS — E890 Postprocedural hypothyroidism: Secondary | ICD-10-CM | POA: Diagnosis not present

## 2018-09-15 DIAGNOSIS — I10 Essential (primary) hypertension: Secondary | ICD-10-CM | POA: Diagnosis not present

## 2018-09-15 DIAGNOSIS — E0500 Thyrotoxicosis with diffuse goiter without thyrotoxic crisis or storm: Secondary | ICD-10-CM | POA: Diagnosis not present

## 2018-09-15 DIAGNOSIS — Z431 Encounter for attention to gastrostomy: Secondary | ICD-10-CM | POA: Diagnosis not present

## 2018-09-15 DIAGNOSIS — E43 Unspecified severe protein-calorie malnutrition: Secondary | ICD-10-CM | POA: Diagnosis not present

## 2018-09-15 DIAGNOSIS — R1310 Dysphagia, unspecified: Secondary | ICD-10-CM | POA: Diagnosis not present

## 2018-09-15 DIAGNOSIS — M1990 Unspecified osteoarthritis, unspecified site: Secondary | ICD-10-CM | POA: Diagnosis not present

## 2018-09-16 ENCOUNTER — Other Ambulatory Visit: Payer: Self-pay | Admitting: *Deleted

## 2018-09-16 NOTE — Patient Outreach (Signed)
[place] [facility]) Care Management  09/16/2018  Brandon Hall 14-Oct-1956 [id number]   Care coordination  [facility] CM collaborated with [facility] multidisciplinary team related to Brandon Hall He is now being followed by Brandon Hall  Recommendations to check on disease management, home care and communication needs  Brandon Letizia L. Lavina Hamman, RN, BSN, [place] Coordinator Office number [phone] Mobile number [phone]  Main [facility] number [phone] Fax number [phone]

## 2018-09-18 DIAGNOSIS — E0500 Thyrotoxicosis with diffuse goiter without thyrotoxic crisis or storm: Secondary | ICD-10-CM | POA: Diagnosis not present

## 2018-09-18 DIAGNOSIS — H269 Unspecified cataract: Secondary | ICD-10-CM | POA: Diagnosis not present

## 2018-09-18 DIAGNOSIS — Z431 Encounter for attention to gastrostomy: Secondary | ICD-10-CM | POA: Diagnosis not present

## 2018-09-18 DIAGNOSIS — R1310 Dysphagia, unspecified: Secondary | ICD-10-CM | POA: Diagnosis not present

## 2018-09-18 DIAGNOSIS — M1990 Unspecified osteoarthritis, unspecified site: Secondary | ICD-10-CM | POA: Diagnosis not present

## 2018-09-18 DIAGNOSIS — E43 Unspecified severe protein-calorie malnutrition: Secondary | ICD-10-CM | POA: Diagnosis not present

## 2018-09-18 DIAGNOSIS — C329 Malignant neoplasm of larynx, unspecified: Secondary | ICD-10-CM | POA: Diagnosis not present

## 2018-09-18 DIAGNOSIS — I10 Essential (primary) hypertension: Secondary | ICD-10-CM | POA: Diagnosis not present

## 2018-09-18 DIAGNOSIS — E890 Postprocedural hypothyroidism: Secondary | ICD-10-CM | POA: Diagnosis not present

## 2018-09-20 ENCOUNTER — Ambulatory Visit: Payer: Medicare HMO | Admitting: *Deleted

## 2018-09-20 DIAGNOSIS — E890 Postprocedural hypothyroidism: Secondary | ICD-10-CM | POA: Diagnosis not present

## 2018-09-20 DIAGNOSIS — Z431 Encounter for attention to gastrostomy: Secondary | ICD-10-CM | POA: Diagnosis not present

## 2018-09-20 DIAGNOSIS — C329 Malignant neoplasm of larynx, unspecified: Secondary | ICD-10-CM | POA: Diagnosis not present

## 2018-09-20 DIAGNOSIS — M1990 Unspecified osteoarthritis, unspecified site: Secondary | ICD-10-CM | POA: Diagnosis not present

## 2018-09-20 DIAGNOSIS — E0500 Thyrotoxicosis with diffuse goiter without thyrotoxic crisis or storm: Secondary | ICD-10-CM | POA: Diagnosis not present

## 2018-09-20 DIAGNOSIS — E43 Unspecified severe protein-calorie malnutrition: Secondary | ICD-10-CM | POA: Diagnosis not present

## 2018-09-20 DIAGNOSIS — H269 Unspecified cataract: Secondary | ICD-10-CM | POA: Diagnosis not present

## 2018-09-20 DIAGNOSIS — I10 Essential (primary) hypertension: Secondary | ICD-10-CM | POA: Diagnosis not present

## 2018-09-20 DIAGNOSIS — R1310 Dysphagia, unspecified: Secondary | ICD-10-CM | POA: Diagnosis not present

## 2018-09-21 ENCOUNTER — Encounter: Payer: Self-pay | Admitting: *Deleted

## 2018-09-21 ENCOUNTER — Other Ambulatory Visit: Payer: Self-pay | Admitting: *Deleted

## 2018-09-21 DIAGNOSIS — E43 Unspecified severe protein-calorie malnutrition: Secondary | ICD-10-CM | POA: Diagnosis not present

## 2018-09-21 DIAGNOSIS — E890 Postprocedural hypothyroidism: Secondary | ICD-10-CM | POA: Diagnosis not present

## 2018-09-21 DIAGNOSIS — E0500 Thyrotoxicosis with diffuse goiter without thyrotoxic crisis or storm: Secondary | ICD-10-CM | POA: Diagnosis not present

## 2018-09-21 DIAGNOSIS — H269 Unspecified cataract: Secondary | ICD-10-CM | POA: Diagnosis not present

## 2018-09-21 DIAGNOSIS — M1990 Unspecified osteoarthritis, unspecified site: Secondary | ICD-10-CM | POA: Diagnosis not present

## 2018-09-21 DIAGNOSIS — I10 Essential (primary) hypertension: Secondary | ICD-10-CM | POA: Diagnosis not present

## 2018-09-21 DIAGNOSIS — Z431 Encounter for attention to gastrostomy: Secondary | ICD-10-CM | POA: Diagnosis not present

## 2018-09-21 DIAGNOSIS — C329 Malignant neoplasm of larynx, unspecified: Secondary | ICD-10-CM | POA: Diagnosis not present

## 2018-09-21 DIAGNOSIS — R1310 Dysphagia, unspecified: Secondary | ICD-10-CM | POA: Diagnosis not present

## 2018-09-21 NOTE — Patient Outreach (Addendum)
[facility]) Care Management   09/21/2018  Youness Lawes 04-08-1956 [id number]  Taboris Tedrow is an 62 y.o. male  Subjective: Initial home visit. Mr. Handelman is status post laryngectomy/tracheotomy/gastrostomy for cancer. He is home with his wife and they are doing exceptionally well. Mrs. Landwehr has learned how to care for the ostomies and is becoming for adept but lacks confidence. The couple works together to accomplish all tasks. Mr. Morrice is pain free. Speech therapist arrived during my visit and worked with him on starting to talk. He will eventually get an electovox.  Objective:   Review of Systems  Constitutional: Positive for weight loss.  Eyes:       Blind in L eye.  Respiratory:       Healing tracheotomy.  Cardiovascular: Negative.   Gastrointestinal: Negative.        Gastrostomy tube in place.  Genitourinary: Negative.   Musculoskeletal: Negative.   Neurological: Negative.   Endo/Heme/Allergies: Negative.   Psychiatric/Behavioral: Negative.    BP 118/60   Pulse 84   Temp 97.9 F (36.6 C)   Wt 117 lb 6.4 oz (53.3 kg)   SpO2 96%   BMI 17.34 kg/m   Physical Exam  Constitutional: He is oriented to person, place, and time.  Eyes:  Blind in L eye, unknown etiology  Cardiovascular: Normal rate.  Respiratory:  Open tracheotomy.  GI: Soft. Bowel sounds are normal.  Gastrostomy tube in place.  Musculoskeletal: Normal range of motion.  Neurological: He is alert and oriented to person, place, and time.  Skin: Skin is warm and dry.  Tracheotomy healing, no signs of infection.  Psychiatric: He has a normal mood and affect.   Patient was recently discharged from hospital and all medications have been reviewed.  Encounter Medications:   Outpatient Encounter Medications as of 09/21/2018  Medication Sig Note  . Amino Acids-Protein Hydrolys (FEEDING SUPPLEMENT, PRO-STAT SUGAR FREE 64,) LIQD Take 30 mLs by mouth daily. (Patient taking differently: Place 30 mLs into  feeding tube daily. )   . hydrochlorothiazide (HYDRODIURIL) 12.5 MG tablet TAKE 1 TABLET EVERY DAY   . [START ON 09/28/2018] HYDROcodone-acetaminophen (HYCET) 7.5-325 mg/15 ml solution Take 10 mLs by mouth 4 (four) times daily as needed for moderate pain.   Marland Kitchen levothyroxine (SYNTHROID, LEVOTHROID) 100 MCG tablet Place 1 tablet (100 mcg total) into feeding tube daily before breakfast.   . Nutritional Supplements (FEEDING SUPPLEMENT, JEVITY 1.5 CAL/FIBER,) LIQD Place 325 mLs into feeding tube 4 (four) times daily.   . polyethylene glycol (MIRALAX / GLYCOLAX) packet Place 17 g into feeding tube daily.  09/21/2018: Takes as needed.  . pravastatin (PRAVACHOL) 40 MG tablet Take 1 tablet (40 mg total) by mouth daily. (Patient taking differently: Place 40 mg into feeding tube daily. )   . Vitamin D, Ergocalciferol, (DRISDOL) 50000 units CAPS capsule TAKE 1 CAPSULE EVERY 7 DAYS. (Patient taking differently: Place 50,000 Units into feeding tube every Saturday. )   . phenol (CHLORASEPTIC) 1.4 % LIQD Use as directed 1 spray in the mouth or throat as needed for throat irritation / pain.    No facility-administered encounter medications on file as of 09/21/2018.     Functional Status:   In your present state of health, do you have any difficulty performing the following activities: 09/21/2018 08/27/2018  Hearing? N N  Vision? Y Y  Comment - -  Difficulty concentrating or making decisions? N N  Walking or climbing stairs? N N  Dressing or bathing? N  N  Doing errands, shopping? Y -  Quarry manager and eating ? Y -  Using the Toilet? N -  In the past six months, have you accidently leaked urine? N -  Do you have problems with loss of bowel control? N -  Managing your Medications? Y -  Managing your Finances? Y -  Housekeeping or managing your Housekeeping? Y -  Some recent data might be hidden    Fall/Depression Screening:    Fall Risk  09/21/2018 09/14/2018 04/27/2018  Falls in the past year? No No No    PHQ 2/9 Scores 09/21/2018 09/14/2018 08/31/2018 04/27/2018 03/12/2018 12/03/2017 08/10/2017  PHQ - 2 Score 0 0 0 0 0 0 0  PHQ- 9 Score - - - - - - -    Assessment:  S/P laryngectomy  [facility] CM Care Plan Problem One     Most Recent Value  Care Plan Problem One  New tracheotomy and post laryngectomy  Role Documenting the Problem One  Care Management Coordinator  Care Plan for Problem One  Active  [facility] Long Term Goal   Pt will not readmit to the hospital related to pulmonary or GI complications in the next 60 days.  [facility] Long Term Goal Start Date  09/21/18  Interventions for Problem One Long Term Goal  Reinforced MD instructions to only drink clear liquids until further notice. Continue ostomy care as instructed.  [facility] CM Short Term Goal #1   Mrs. Bowen will become more confident is her efforts to feed Mr. Strawderman through his G-tube over the next 30 days.  [facility] CM Short Term Goal #1 Start Date  09/21/18  Interventions for Short Term Goal #1  Assisted Mrs. Seres with feeding today. We worked on getting the correct amounts of fluids. She is to get a measuring cup to assist with correct amounts of Promod.     Plan: Find out who will provide ProMod liquid protein.           Get insurance OTC catalog           Call pt next week           Visit in 2 weeks.           Provided Advanced Directives and MOST form documents.  Zara Council. Burgess Estelle, MSN, [facility] Gerontological Nurse Practitioner [facility] Care Management [phone]

## 2018-09-22 ENCOUNTER — Encounter: Payer: Self-pay | Admitting: *Deleted

## 2018-09-23 DIAGNOSIS — I10 Essential (primary) hypertension: Secondary | ICD-10-CM | POA: Diagnosis not present

## 2018-09-23 DIAGNOSIS — Z431 Encounter for attention to gastrostomy: Secondary | ICD-10-CM | POA: Diagnosis not present

## 2018-09-23 DIAGNOSIS — R1310 Dysphagia, unspecified: Secondary | ICD-10-CM | POA: Diagnosis not present

## 2018-09-23 DIAGNOSIS — E890 Postprocedural hypothyroidism: Secondary | ICD-10-CM | POA: Diagnosis not present

## 2018-09-23 DIAGNOSIS — H269 Unspecified cataract: Secondary | ICD-10-CM | POA: Diagnosis not present

## 2018-09-23 DIAGNOSIS — C329 Malignant neoplasm of larynx, unspecified: Secondary | ICD-10-CM | POA: Diagnosis not present

## 2018-09-23 DIAGNOSIS — E0500 Thyrotoxicosis with diffuse goiter without thyrotoxic crisis or storm: Secondary | ICD-10-CM | POA: Diagnosis not present

## 2018-09-23 DIAGNOSIS — M1990 Unspecified osteoarthritis, unspecified site: Secondary | ICD-10-CM | POA: Diagnosis not present

## 2018-09-23 DIAGNOSIS — E43 Unspecified severe protein-calorie malnutrition: Secondary | ICD-10-CM | POA: Diagnosis not present

## 2018-09-24 ENCOUNTER — Encounter (HOSPITAL_COMMUNITY)
Admission: RE | Admit: 2018-09-24 | Discharge: 2018-09-24 | Disposition: A | Discharge status: Home / Self Care | Payer: Medicare HMO | Source: Ambulatory Visit | Attending: Urology | Admitting: Urology

## 2018-09-24 DIAGNOSIS — Z8546 Personal history of malignant neoplasm of prostate: Secondary | ICD-10-CM | POA: Diagnosis not present

## 2018-09-24 DIAGNOSIS — C61 Malignant neoplasm of prostate: Secondary | ICD-10-CM | POA: Diagnosis not present

## 2018-09-24 MED ORDER — TECHNETIUM TC 99M MEDRONATE IV KIT
21.2000 | PACK | Freq: Once | INTRAVENOUS | Status: AC
  Administered 2018-09-24: 21.2 via INTRAVENOUS

## 2018-09-27 ENCOUNTER — Other Ambulatory Visit: Payer: Self-pay | Admitting: *Deleted

## 2018-09-27 NOTE — Patient Outreach (Signed)
Care Coordinatiion. Mrs. Brandon Hall had asked me last week who would be supplying the ProMod protein supplement and I told her I would call [facility] which I did today and they have verified that they will provide the ProMod. I advised that he has 1/2 a container left. I was informed that [name] should be getting 5.5 cartons of Jevity daily to tolerance. Brandon Hall has only been giving him 4 cartons a day. I called her and didn't get a an answer and then call his niece Brandon Hall. I advised Brandon Hall of the previous information asked if she would relay it to Mr and Mrs. Brandon Hall. Brandon Hall informed me that initially right after his surgery he could not tolerate more than the 4 cans per day. I encoiuraged them to try to give him 4.5 cartons with 3 feedings and just the one can at night. She said they would try this.  One of my co-workers will check up on him by phone next week to see how he is tolerating the extra fluid.  Brandon Hall. Brandon Neither, MSN, [facility] Gerontological Nurse Practitioner [facility] Care Management [phone]

## 2018-09-28 DIAGNOSIS — M1990 Unspecified osteoarthritis, unspecified site: Secondary | ICD-10-CM | POA: Diagnosis not present

## 2018-09-28 DIAGNOSIS — Z431 Encounter for attention to gastrostomy: Secondary | ICD-10-CM | POA: Diagnosis not present

## 2018-09-28 DIAGNOSIS — C329 Malignant neoplasm of larynx, unspecified: Secondary | ICD-10-CM | POA: Diagnosis not present

## 2018-09-28 DIAGNOSIS — E0500 Thyrotoxicosis with diffuse goiter without thyrotoxic crisis or storm: Secondary | ICD-10-CM | POA: Diagnosis not present

## 2018-09-28 DIAGNOSIS — E890 Postprocedural hypothyroidism: Secondary | ICD-10-CM | POA: Diagnosis not present

## 2018-09-28 DIAGNOSIS — E43 Unspecified severe protein-calorie malnutrition: Secondary | ICD-10-CM | POA: Diagnosis not present

## 2018-09-28 DIAGNOSIS — I10 Essential (primary) hypertension: Secondary | ICD-10-CM | POA: Diagnosis not present

## 2018-09-28 DIAGNOSIS — R1310 Dysphagia, unspecified: Secondary | ICD-10-CM | POA: Diagnosis not present

## 2018-09-28 DIAGNOSIS — H269 Unspecified cataract: Secondary | ICD-10-CM | POA: Diagnosis not present

## 2018-09-29 DIAGNOSIS — R1310 Dysphagia, unspecified: Secondary | ICD-10-CM | POA: Diagnosis not present

## 2018-09-29 DIAGNOSIS — E43 Unspecified severe protein-calorie malnutrition: Secondary | ICD-10-CM | POA: Diagnosis not present

## 2018-09-29 DIAGNOSIS — C329 Malignant neoplasm of larynx, unspecified: Secondary | ICD-10-CM | POA: Diagnosis not present

## 2018-09-30 DIAGNOSIS — E0500 Thyrotoxicosis with diffuse goiter without thyrotoxic crisis or storm: Secondary | ICD-10-CM | POA: Diagnosis not present

## 2018-09-30 DIAGNOSIS — Z431 Encounter for attention to gastrostomy: Secondary | ICD-10-CM | POA: Diagnosis not present

## 2018-09-30 DIAGNOSIS — C329 Malignant neoplasm of larynx, unspecified: Secondary | ICD-10-CM | POA: Diagnosis not present

## 2018-09-30 DIAGNOSIS — E890 Postprocedural hypothyroidism: Secondary | ICD-10-CM | POA: Diagnosis not present

## 2018-09-30 DIAGNOSIS — E43 Unspecified severe protein-calorie malnutrition: Secondary | ICD-10-CM | POA: Diagnosis not present

## 2018-09-30 DIAGNOSIS — M1990 Unspecified osteoarthritis, unspecified site: Secondary | ICD-10-CM | POA: Diagnosis not present

## 2018-09-30 DIAGNOSIS — R1310 Dysphagia, unspecified: Secondary | ICD-10-CM | POA: Diagnosis not present

## 2018-09-30 DIAGNOSIS — H269 Unspecified cataract: Secondary | ICD-10-CM | POA: Diagnosis not present

## 2018-09-30 DIAGNOSIS — I10 Essential (primary) hypertension: Secondary | ICD-10-CM | POA: Diagnosis not present

## 2018-10-04 DIAGNOSIS — E0500 Thyrotoxicosis with diffuse goiter without thyrotoxic crisis or storm: Secondary | ICD-10-CM | POA: Diagnosis not present

## 2018-10-04 DIAGNOSIS — C329 Malignant neoplasm of larynx, unspecified: Secondary | ICD-10-CM | POA: Diagnosis not present

## 2018-10-04 DIAGNOSIS — I10 Essential (primary) hypertension: Secondary | ICD-10-CM | POA: Diagnosis not present

## 2018-10-04 DIAGNOSIS — H269 Unspecified cataract: Secondary | ICD-10-CM | POA: Diagnosis not present

## 2018-10-04 DIAGNOSIS — M1990 Unspecified osteoarthritis, unspecified site: Secondary | ICD-10-CM | POA: Diagnosis not present

## 2018-10-04 DIAGNOSIS — E890 Postprocedural hypothyroidism: Secondary | ICD-10-CM | POA: Diagnosis not present

## 2018-10-04 DIAGNOSIS — R1310 Dysphagia, unspecified: Secondary | ICD-10-CM | POA: Diagnosis not present

## 2018-10-04 DIAGNOSIS — E43 Unspecified severe protein-calorie malnutrition: Secondary | ICD-10-CM | POA: Diagnosis not present

## 2018-10-04 DIAGNOSIS — Z431 Encounter for attention to gastrostomy: Secondary | ICD-10-CM | POA: Diagnosis not present

## 2018-10-05 DIAGNOSIS — Z431 Encounter for attention to gastrostomy: Secondary | ICD-10-CM | POA: Diagnosis not present

## 2018-10-05 DIAGNOSIS — R1310 Dysphagia, unspecified: Secondary | ICD-10-CM | POA: Diagnosis not present

## 2018-10-05 DIAGNOSIS — M1990 Unspecified osteoarthritis, unspecified site: Secondary | ICD-10-CM | POA: Diagnosis not present

## 2018-10-05 DIAGNOSIS — C329 Malignant neoplasm of larynx, unspecified: Secondary | ICD-10-CM | POA: Diagnosis not present

## 2018-10-05 DIAGNOSIS — H269 Unspecified cataract: Secondary | ICD-10-CM | POA: Diagnosis not present

## 2018-10-05 DIAGNOSIS — I10 Essential (primary) hypertension: Secondary | ICD-10-CM | POA: Diagnosis not present

## 2018-10-05 DIAGNOSIS — E43 Unspecified severe protein-calorie malnutrition: Secondary | ICD-10-CM | POA: Diagnosis not present

## 2018-10-05 DIAGNOSIS — E890 Postprocedural hypothyroidism: Secondary | ICD-10-CM | POA: Diagnosis not present

## 2018-10-05 DIAGNOSIS — E0500 Thyrotoxicosis with diffuse goiter without thyrotoxic crisis or storm: Secondary | ICD-10-CM | POA: Diagnosis not present

## 2018-10-07 ENCOUNTER — Ambulatory Visit: Payer: Self-pay

## 2018-10-07 ENCOUNTER — Other Ambulatory Visit: Payer: Self-pay

## 2018-10-07 DIAGNOSIS — R1310 Dysphagia, unspecified: Secondary | ICD-10-CM | POA: Diagnosis not present

## 2018-10-07 DIAGNOSIS — I10 Essential (primary) hypertension: Secondary | ICD-10-CM | POA: Diagnosis not present

## 2018-10-07 DIAGNOSIS — E43 Unspecified severe protein-calorie malnutrition: Secondary | ICD-10-CM | POA: Diagnosis not present

## 2018-10-07 DIAGNOSIS — Z431 Encounter for attention to gastrostomy: Secondary | ICD-10-CM | POA: Diagnosis not present

## 2018-10-07 DIAGNOSIS — E0500 Thyrotoxicosis with diffuse goiter without thyrotoxic crisis or storm: Secondary | ICD-10-CM | POA: Diagnosis not present

## 2018-10-07 DIAGNOSIS — E890 Postprocedural hypothyroidism: Secondary | ICD-10-CM | POA: Diagnosis not present

## 2018-10-07 DIAGNOSIS — H269 Unspecified cataract: Secondary | ICD-10-CM | POA: Diagnosis not present

## 2018-10-07 DIAGNOSIS — C329 Malignant neoplasm of larynx, unspecified: Secondary | ICD-10-CM | POA: Diagnosis not present

## 2018-10-07 DIAGNOSIS — M1990 Unspecified osteoarthritis, unspecified site: Secondary | ICD-10-CM | POA: Diagnosis not present

## 2018-10-07 NOTE — Patient Outreach (Signed)
[place] [facility]) Care Management  10/07/2018  Brandon Hall Aug 09, 1956 [id number]    Transition of care outreach complete. Spoke with [name]. HIPAA verifiers obtained. She reported that he was doing well and eating without difficulty. Reported that he is also drinking protein supplements up to 4 times a day if needed. Confirmed continued engagement with Home Health services.  Stated that [name] has an occasional cough but no complaints of pain. They are waiting for an update regarding the status of his voice device. No urgent questions or concerns at this time.    PLAN Deloria Lair, assigned NP will provide further follow up as needed.   [place] [phone]

## 2018-10-11 DIAGNOSIS — E559 Vitamin D deficiency, unspecified: Secondary | ICD-10-CM | POA: Diagnosis not present

## 2018-10-11 DIAGNOSIS — C329 Malignant neoplasm of larynx, unspecified: Secondary | ICD-10-CM | POA: Diagnosis not present

## 2018-10-11 DIAGNOSIS — E43 Unspecified severe protein-calorie malnutrition: Secondary | ICD-10-CM | POA: Diagnosis not present

## 2018-10-11 DIAGNOSIS — R1310 Dysphagia, unspecified: Secondary | ICD-10-CM | POA: Diagnosis not present

## 2018-10-11 DIAGNOSIS — C760 Malignant neoplasm of head, face and neck: Secondary | ICD-10-CM | POA: Diagnosis not present

## 2018-10-12 ENCOUNTER — Other Ambulatory Visit: Payer: Self-pay | Admitting: *Deleted

## 2018-10-12 DIAGNOSIS — C329 Malignant neoplasm of larynx, unspecified: Secondary | ICD-10-CM | POA: Diagnosis not present

## 2018-10-12 DIAGNOSIS — E890 Postprocedural hypothyroidism: Secondary | ICD-10-CM | POA: Diagnosis not present

## 2018-10-12 DIAGNOSIS — H269 Unspecified cataract: Secondary | ICD-10-CM | POA: Diagnosis not present

## 2018-10-12 DIAGNOSIS — E43 Unspecified severe protein-calorie malnutrition: Secondary | ICD-10-CM | POA: Diagnosis not present

## 2018-10-12 DIAGNOSIS — E0500 Thyrotoxicosis with diffuse goiter without thyrotoxic crisis or storm: Secondary | ICD-10-CM | POA: Diagnosis not present

## 2018-10-12 DIAGNOSIS — R1310 Dysphagia, unspecified: Secondary | ICD-10-CM | POA: Diagnosis not present

## 2018-10-12 DIAGNOSIS — Z431 Encounter for attention to gastrostomy: Secondary | ICD-10-CM | POA: Diagnosis not present

## 2018-10-12 DIAGNOSIS — I10 Essential (primary) hypertension: Secondary | ICD-10-CM | POA: Diagnosis not present

## 2018-10-12 DIAGNOSIS — M1990 Unspecified osteoarthritis, unspecified site: Secondary | ICD-10-CM | POA: Diagnosis not present

## 2018-10-12 NOTE — Patient Outreach (Signed)
Transition of care call. Mrs. Gilliam reports Mr. Demir is doing very well. He saw the surgeon yesterday and has been given the OK to eat and drink what ever he wants to. This morning he had eggs and onions, country ham and coffee. He is still active with PT and ST.  Mrs. Carnero said she did not need any intervention from me at this time.   I will call again in 2 weeks for his final transition of care call.  Eulah Pont. Myrtie Neither, MSN, [facility] Gerontological Nurse Practitioner [facility] Care Management [phone]

## 2018-10-13 DIAGNOSIS — E559 Vitamin D deficiency, unspecified: Secondary | ICD-10-CM | POA: Diagnosis not present

## 2018-10-13 DIAGNOSIS — E43 Unspecified severe protein-calorie malnutrition: Secondary | ICD-10-CM | POA: Diagnosis not present

## 2018-10-13 DIAGNOSIS — Z930 Tracheostomy status: Secondary | ICD-10-CM | POA: Diagnosis not present

## 2018-10-13 DIAGNOSIS — C760 Malignant neoplasm of head, face and neck: Secondary | ICD-10-CM | POA: Diagnosis not present

## 2018-10-13 DIAGNOSIS — R1310 Dysphagia, unspecified: Secondary | ICD-10-CM | POA: Diagnosis not present

## 2018-10-13 DIAGNOSIS — C329 Malignant neoplasm of larynx, unspecified: Secondary | ICD-10-CM | POA: Diagnosis not present

## 2018-10-14 DIAGNOSIS — I10 Essential (primary) hypertension: Secondary | ICD-10-CM | POA: Diagnosis not present

## 2018-10-14 DIAGNOSIS — H269 Unspecified cataract: Secondary | ICD-10-CM | POA: Diagnosis not present

## 2018-10-14 DIAGNOSIS — E0500 Thyrotoxicosis with diffuse goiter without thyrotoxic crisis or storm: Secondary | ICD-10-CM | POA: Diagnosis not present

## 2018-10-14 DIAGNOSIS — E43 Unspecified severe protein-calorie malnutrition: Secondary | ICD-10-CM | POA: Diagnosis not present

## 2018-10-14 DIAGNOSIS — M1990 Unspecified osteoarthritis, unspecified site: Secondary | ICD-10-CM | POA: Diagnosis not present

## 2018-10-14 DIAGNOSIS — R1310 Dysphagia, unspecified: Secondary | ICD-10-CM | POA: Diagnosis not present

## 2018-10-14 DIAGNOSIS — Z431 Encounter for attention to gastrostomy: Secondary | ICD-10-CM | POA: Diagnosis not present

## 2018-10-14 DIAGNOSIS — C329 Malignant neoplasm of larynx, unspecified: Secondary | ICD-10-CM | POA: Diagnosis not present

## 2018-10-14 DIAGNOSIS — E890 Postprocedural hypothyroidism: Secondary | ICD-10-CM | POA: Diagnosis not present

## 2018-10-18 DIAGNOSIS — H269 Unspecified cataract: Secondary | ICD-10-CM | POA: Diagnosis not present

## 2018-10-18 DIAGNOSIS — Z431 Encounter for attention to gastrostomy: Secondary | ICD-10-CM | POA: Diagnosis not present

## 2018-10-18 DIAGNOSIS — E890 Postprocedural hypothyroidism: Secondary | ICD-10-CM | POA: Diagnosis not present

## 2018-10-18 DIAGNOSIS — C329 Malignant neoplasm of larynx, unspecified: Secondary | ICD-10-CM | POA: Diagnosis not present

## 2018-10-18 DIAGNOSIS — E0500 Thyrotoxicosis with diffuse goiter without thyrotoxic crisis or storm: Secondary | ICD-10-CM | POA: Diagnosis not present

## 2018-10-18 DIAGNOSIS — E43 Unspecified severe protein-calorie malnutrition: Secondary | ICD-10-CM | POA: Diagnosis not present

## 2018-10-18 DIAGNOSIS — I10 Essential (primary) hypertension: Secondary | ICD-10-CM | POA: Diagnosis not present

## 2018-10-18 DIAGNOSIS — M1990 Unspecified osteoarthritis, unspecified site: Secondary | ICD-10-CM | POA: Diagnosis not present

## 2018-10-18 DIAGNOSIS — R1310 Dysphagia, unspecified: Secondary | ICD-10-CM | POA: Diagnosis not present

## 2018-10-19 DIAGNOSIS — I10 Essential (primary) hypertension: Secondary | ICD-10-CM | POA: Diagnosis not present

## 2018-10-19 DIAGNOSIS — M1990 Unspecified osteoarthritis, unspecified site: Secondary | ICD-10-CM | POA: Diagnosis not present

## 2018-10-19 DIAGNOSIS — R1310 Dysphagia, unspecified: Secondary | ICD-10-CM | POA: Diagnosis not present

## 2018-10-19 DIAGNOSIS — E0500 Thyrotoxicosis with diffuse goiter without thyrotoxic crisis or storm: Secondary | ICD-10-CM | POA: Diagnosis not present

## 2018-10-19 DIAGNOSIS — C329 Malignant neoplasm of larynx, unspecified: Secondary | ICD-10-CM | POA: Diagnosis not present

## 2018-10-19 DIAGNOSIS — E43 Unspecified severe protein-calorie malnutrition: Secondary | ICD-10-CM | POA: Diagnosis not present

## 2018-10-19 DIAGNOSIS — Z431 Encounter for attention to gastrostomy: Secondary | ICD-10-CM | POA: Diagnosis not present

## 2018-10-19 DIAGNOSIS — H269 Unspecified cataract: Secondary | ICD-10-CM | POA: Diagnosis not present

## 2018-10-19 DIAGNOSIS — E890 Postprocedural hypothyroidism: Secondary | ICD-10-CM | POA: Diagnosis not present

## 2018-10-20 DIAGNOSIS — I10 Essential (primary) hypertension: Secondary | ICD-10-CM | POA: Diagnosis not present

## 2018-10-20 DIAGNOSIS — C329 Malignant neoplasm of larynx, unspecified: Secondary | ICD-10-CM | POA: Diagnosis not present

## 2018-10-20 DIAGNOSIS — M1990 Unspecified osteoarthritis, unspecified site: Secondary | ICD-10-CM | POA: Diagnosis not present

## 2018-10-20 DIAGNOSIS — E890 Postprocedural hypothyroidism: Secondary | ICD-10-CM | POA: Diagnosis not present

## 2018-10-20 DIAGNOSIS — R1310 Dysphagia, unspecified: Secondary | ICD-10-CM | POA: Diagnosis not present

## 2018-10-20 DIAGNOSIS — E43 Unspecified severe protein-calorie malnutrition: Secondary | ICD-10-CM | POA: Diagnosis not present

## 2018-10-20 DIAGNOSIS — Z431 Encounter for attention to gastrostomy: Secondary | ICD-10-CM | POA: Diagnosis not present

## 2018-10-20 DIAGNOSIS — H269 Unspecified cataract: Secondary | ICD-10-CM | POA: Diagnosis not present

## 2018-10-20 DIAGNOSIS — E0500 Thyrotoxicosis with diffuse goiter without thyrotoxic crisis or storm: Secondary | ICD-10-CM | POA: Diagnosis not present

## 2018-10-26 ENCOUNTER — Other Ambulatory Visit: Payer: Self-pay | Admitting: *Deleted

## 2018-10-26 DIAGNOSIS — Z483 Aftercare following surgery for neoplasm: Secondary | ICD-10-CM | POA: Diagnosis not present

## 2018-10-26 DIAGNOSIS — Z9002 Acquired absence of larynx: Secondary | ICD-10-CM | POA: Diagnosis not present

## 2018-10-26 DIAGNOSIS — M1990 Unspecified osteoarthritis, unspecified site: Secondary | ICD-10-CM | POA: Diagnosis not present

## 2018-10-26 DIAGNOSIS — E43 Unspecified severe protein-calorie malnutrition: Secondary | ICD-10-CM | POA: Diagnosis not present

## 2018-10-26 DIAGNOSIS — R1310 Dysphagia, unspecified: Secondary | ICD-10-CM | POA: Diagnosis not present

## 2018-10-26 DIAGNOSIS — D638 Anemia in other chronic diseases classified elsewhere: Secondary | ICD-10-CM | POA: Diagnosis not present

## 2018-10-26 DIAGNOSIS — E0500 Thyrotoxicosis with diffuse goiter without thyrotoxic crisis or storm: Secondary | ICD-10-CM | POA: Diagnosis not present

## 2018-10-26 DIAGNOSIS — I10 Essential (primary) hypertension: Secondary | ICD-10-CM | POA: Diagnosis not present

## 2018-10-26 DIAGNOSIS — C329 Malignant neoplasm of larynx, unspecified: Secondary | ICD-10-CM | POA: Diagnosis not present

## 2018-10-28 DIAGNOSIS — I10 Essential (primary) hypertension: Secondary | ICD-10-CM | POA: Diagnosis not present

## 2018-10-28 DIAGNOSIS — Z483 Aftercare following surgery for neoplasm: Secondary | ICD-10-CM | POA: Diagnosis not present

## 2018-10-28 DIAGNOSIS — M1990 Unspecified osteoarthritis, unspecified site: Secondary | ICD-10-CM | POA: Diagnosis not present

## 2018-10-28 DIAGNOSIS — Z9002 Acquired absence of larynx: Secondary | ICD-10-CM | POA: Diagnosis not present

## 2018-10-28 DIAGNOSIS — R1310 Dysphagia, unspecified: Secondary | ICD-10-CM | POA: Diagnosis not present

## 2018-10-28 DIAGNOSIS — C329 Malignant neoplasm of larynx, unspecified: Secondary | ICD-10-CM | POA: Diagnosis not present

## 2018-10-28 DIAGNOSIS — D638 Anemia in other chronic diseases classified elsewhere: Secondary | ICD-10-CM | POA: Diagnosis not present

## 2018-10-28 DIAGNOSIS — E43 Unspecified severe protein-calorie malnutrition: Secondary | ICD-10-CM | POA: Diagnosis not present

## 2018-10-28 DIAGNOSIS — E0500 Thyrotoxicosis with diffuse goiter without thyrotoxic crisis or storm: Secondary | ICD-10-CM | POA: Diagnosis not present

## 2018-10-29 ENCOUNTER — Ambulatory Visit: Payer: Medicare HMO | Admitting: Family Medicine

## 2018-10-29 ENCOUNTER — Encounter: Payer: Self-pay | Admitting: *Deleted

## 2018-10-29 NOTE — Patient Outreach (Signed)
Final transition of care call. I spoke to Mrs. Bialecki today (Mr. Brummet has had laryngectomy and cannot talk on the phone). She reports Mr. Dattilo is doing very well. He is eating well and only can tolerate smaller amounts of gastrostomy feedings. He is to follow up with his doctor in 2 weeks. I have advised Mrs. Kindley to ask about the plan for his gastrostomy tube. I advised that it may be wise to keep it for some time to make sure he may not need it in the future if he has a set back. She said she understood. She does not have any questions for me today.  I have advised that since he is doing well and has no other care management needs I am going to close his case. I will send them a letter and advise his doctor also. I have told her if she has any questions or needs in the future she can still call me.  Eulah Pont. Myrtie Neither, MSN, [facility] Gerontological Nurse Practitioner [facility] Care Management [phone]

## 2018-11-02 DIAGNOSIS — M1990 Unspecified osteoarthritis, unspecified site: Secondary | ICD-10-CM | POA: Diagnosis not present

## 2018-11-02 DIAGNOSIS — E43 Unspecified severe protein-calorie malnutrition: Secondary | ICD-10-CM | POA: Diagnosis not present

## 2018-11-02 DIAGNOSIS — D638 Anemia in other chronic diseases classified elsewhere: Secondary | ICD-10-CM | POA: Diagnosis not present

## 2018-11-02 DIAGNOSIS — Z483 Aftercare following surgery for neoplasm: Secondary | ICD-10-CM | POA: Diagnosis not present

## 2018-11-02 DIAGNOSIS — Z9002 Acquired absence of larynx: Secondary | ICD-10-CM | POA: Diagnosis not present

## 2018-11-02 DIAGNOSIS — R1310 Dysphagia, unspecified: Secondary | ICD-10-CM | POA: Diagnosis not present

## 2018-11-02 DIAGNOSIS — C329 Malignant neoplasm of larynx, unspecified: Secondary | ICD-10-CM | POA: Diagnosis not present

## 2018-11-02 DIAGNOSIS — E0500 Thyrotoxicosis with diffuse goiter without thyrotoxic crisis or storm: Secondary | ICD-10-CM | POA: Diagnosis not present

## 2018-11-02 DIAGNOSIS — I10 Essential (primary) hypertension: Secondary | ICD-10-CM | POA: Diagnosis not present

## 2018-11-04 DIAGNOSIS — M1990 Unspecified osteoarthritis, unspecified site: Secondary | ICD-10-CM | POA: Diagnosis not present

## 2018-11-04 DIAGNOSIS — E0500 Thyrotoxicosis with diffuse goiter without thyrotoxic crisis or storm: Secondary | ICD-10-CM | POA: Diagnosis not present

## 2018-11-04 DIAGNOSIS — Z9002 Acquired absence of larynx: Secondary | ICD-10-CM | POA: Diagnosis not present

## 2018-11-04 DIAGNOSIS — I10 Essential (primary) hypertension: Secondary | ICD-10-CM | POA: Diagnosis not present

## 2018-11-04 DIAGNOSIS — Z483 Aftercare following surgery for neoplasm: Secondary | ICD-10-CM | POA: Diagnosis not present

## 2018-11-04 DIAGNOSIS — C329 Malignant neoplasm of larynx, unspecified: Secondary | ICD-10-CM | POA: Diagnosis not present

## 2018-11-04 DIAGNOSIS — E43 Unspecified severe protein-calorie malnutrition: Secondary | ICD-10-CM | POA: Diagnosis not present

## 2018-11-04 DIAGNOSIS — D638 Anemia in other chronic diseases classified elsewhere: Secondary | ICD-10-CM | POA: Diagnosis not present

## 2018-11-04 DIAGNOSIS — R1310 Dysphagia, unspecified: Secondary | ICD-10-CM | POA: Diagnosis not present

## 2018-11-09 DIAGNOSIS — C61 Malignant neoplasm of prostate: Secondary | ICD-10-CM | POA: Diagnosis not present

## 2018-11-10 DIAGNOSIS — Z483 Aftercare following surgery for neoplasm: Secondary | ICD-10-CM | POA: Diagnosis not present

## 2018-11-10 DIAGNOSIS — C329 Malignant neoplasm of larynx, unspecified: Secondary | ICD-10-CM | POA: Diagnosis not present

## 2018-11-10 DIAGNOSIS — D638 Anemia in other chronic diseases classified elsewhere: Secondary | ICD-10-CM | POA: Diagnosis not present

## 2018-11-10 DIAGNOSIS — Z9002 Acquired absence of larynx: Secondary | ICD-10-CM | POA: Diagnosis not present

## 2018-11-10 DIAGNOSIS — E43 Unspecified severe protein-calorie malnutrition: Secondary | ICD-10-CM | POA: Diagnosis not present

## 2018-11-10 DIAGNOSIS — E0500 Thyrotoxicosis with diffuse goiter without thyrotoxic crisis or storm: Secondary | ICD-10-CM | POA: Diagnosis not present

## 2018-11-10 DIAGNOSIS — I10 Essential (primary) hypertension: Secondary | ICD-10-CM | POA: Diagnosis not present

## 2018-11-10 DIAGNOSIS — M1990 Unspecified osteoarthritis, unspecified site: Secondary | ICD-10-CM | POA: Diagnosis not present

## 2018-11-10 DIAGNOSIS — R1310 Dysphagia, unspecified: Secondary | ICD-10-CM | POA: Diagnosis not present

## 2018-11-11 ENCOUNTER — Ambulatory Visit (INDEPENDENT_AMBULATORY_CARE_PROVIDER_SITE_OTHER): Payer: Medicare HMO | Admitting: Family Medicine

## 2018-11-11 ENCOUNTER — Encounter: Payer: Self-pay | Admitting: Family Medicine

## 2018-11-11 VITALS — BP 126/85 | HR 73 | Temp 96.8°F | Ht 69.0 in | Wt 123.2 lb

## 2018-11-11 DIAGNOSIS — E785 Hyperlipidemia, unspecified: Secondary | ICD-10-CM | POA: Diagnosis not present

## 2018-11-11 DIAGNOSIS — I10 Essential (primary) hypertension: Secondary | ICD-10-CM | POA: Diagnosis not present

## 2018-11-11 DIAGNOSIS — E039 Hypothyroidism, unspecified: Secondary | ICD-10-CM

## 2018-11-11 MED ORDER — HYDROCHLOROTHIAZIDE 12.5 MG PO TABS: 12.5000 mg | ORAL_TABLET | Freq: Every day | ORAL | 3 refills | Status: DC

## 2018-11-11 MED ORDER — PRAVASTATIN SODIUM 40 MG PO TABS: 40.0000 mg | ORAL_TABLET | Freq: Every day | ORAL | 3 refills | Status: AC

## 2018-11-11 MED ORDER — LEVOTHYROXINE SODIUM 100 MCG PO TABS: 100.0000 ug | ORAL_TABLET | Freq: Every day | ORAL | 3 refills | Status: DC

## 2018-11-11 NOTE — Progress Notes (Signed)
BP 126/85   Pulse 73   Temp (!) 96.8 F (36 C) (Oral)   Ht 5\' 9"  (1.753 m)   Wt 123 lb 3.2 oz (55.9 kg)   BMI 18.19 kg/m    Subjective:    Patient ID: Brandon Hall, male    DOB: [birth year], 62 y.o.   MRN: [id number]  HPI: Brandon Hall is a 62 y.o. male presenting on 11/11/2018 for Hyperlipidemia (6 month follow up) and Hypertension   HPI Hypertension Patient is currently on hydrochlorothiazide, and their blood pressure today is hydrochlorothiazide. Patient denies any lightheadedness or dizziness. Patient denies headaches, blurred vision, chest pains, shortness of breath, or weakness. Denies any side effects from medication and is content with current medication.   Hypothyroidism recheck Patient is coming in for thyroid recheck today as well. They deny any issues with hair changes or heat or cold problems or diarrhea or constipation. They deny any chest pain or palpitations. They are currently on levothyroxine   Hyperlipidemia Patient is coming in for recheck of his hyperlipidemia. The patient is currently taking pravastatin. They deny any issues with myalgias or history of liver damage from it. They deny any focal numbness or weakness or chest pain.   Relevant past medical, surgical, family and social history reviewed and updated as indicated. Interim medical history since our last visit reviewed. Allergies and medications reviewed and updated.  Review of Systems  Constitutional: Negative for chills and fever.  Eyes: Negative for visual disturbance.  Respiratory: Negative for shortness of breath and wheezing.   Cardiovascular: Negative for chest pain and leg swelling.  Musculoskeletal: Negative for back pain and gait problem.  Skin: Negative for rash.  Neurological: Negative for dizziness, weakness, light-headedness and numbness.  All other systems reviewed and are negative.   Per HPI unless specifically indicated above   Allergies as of 11/11/2018   No Known  Allergies     Medication List        Accurate as of 11/11/18 11:21 AM. Always use your most recent med list.          hydrochlorothiazide 12.5 MG tablet Commonly known as:  HYDRODIURIL Take 1 tablet (12.5 mg total) by mouth daily.   levothyroxine 100 MCG tablet Commonly known as:  SYNTHROID, LEVOTHROID Place 1 tablet (100 mcg total) into feeding tube daily before breakfast.   phenol 1.4 % Liqd Commonly known as:  CHLORASEPTIC Use as directed 1 spray in the mouth or throat as needed for throat irritation / pain.   polyethylene glycol packet Commonly known as:  MIRALAX / GLYCOLAX Place 17 g into feeding tube daily.   pravastatin 40 MG tablet Commonly known as:  PRAVACHOL Take 1 tablet (40 mg total) by mouth daily.   Vitamin D (Ergocalciferol) 1.25 MG (50000 UT) Caps capsule Commonly known as:  DRISDOL TAKE 1 CAPSULE EVERY 7 DAYS.          Objective:    BP 126/85   Pulse 73   Temp (!) 96.8 F (36 C) (Oral)   Ht 5\' 9"  (1.753 m)   Wt 123 lb 3.2 oz (55.9 kg)   BMI 18.19 kg/m   Wt Readings from Last 3 Encounters:  11/11/18 123 lb 3.2 oz (55.9 kg)  09/21/18 117 lb 6.4 oz (53.3 kg)  09/14/18 119 lb 9.6 oz (54.3 kg)    Physical Exam  Constitutional: He is oriented to person, place, and time. He appears well-developed and well-nourished. No distress.  Eyes: Conjunctivae are normal.  No scleral icterus.  Neck: Neck supple. No thyromegaly present.  Cardiovascular: Normal rate, regular rhythm, normal heart sounds and intact distal pulses.  No murmur heard. Pulmonary/Chest: Effort normal and breath sounds normal. No respiratory distress. He has no wheezes.  Musculoskeletal: Normal range of motion. He exhibits no edema.  Lymphadenopathy:    He has no cervical adenopathy.  Neurological: He is alert and oriented to person, place, and time. Coordination normal.  Skin: Skin is warm and dry. No rash noted. He is not diaphoretic.  Psychiatric: He has a normal mood and  affect. His behavior is normal.  Nursing note and vitals reviewed.     Assessment & Plan:   Problem List Items Addressed This Visit      Cardiovascular and Mediastinum   Hypertension   Relevant Medications   hydrochlorothiazide (HYDRODIURIL) 12.5 MG tablet   pravastatin (PRAVACHOL) 40 MG tablet   Other Relevant Orders   CBC with Differential/Platelet   CMP14+EGFR     Endocrine   Hypothyroid - Primary   Relevant Medications   levothyroxine (SYNTHROID, LEVOTHROID) 100 MCG tablet   Other Relevant Orders   CBC with Differential/Platelet   TSH     Other   Dyslipidemia   Relevant Medications   pravastatin (PRAVACHOL) 40 MG tablet   Other Relevant Orders   Lipid panel       Follow up plan: Return in about 6 months (around 05/12/2019), or if symptoms worsen or fail to improve, for Hypertension and thyroid.  Counseling provided for all of the vaccine components Orders Placed This Encounter  Procedures  . CBC with Differential/Platelet  . CMP14+EGFR  . Lipid panel  . TSH    Arville Care, MD [facility] 11/11/2018, 11:21 AM

## 2018-11-12 DIAGNOSIS — M1990 Unspecified osteoarthritis, unspecified site: Secondary | ICD-10-CM | POA: Diagnosis not present

## 2018-11-12 DIAGNOSIS — R1310 Dysphagia, unspecified: Secondary | ICD-10-CM | POA: Diagnosis not present

## 2018-11-12 DIAGNOSIS — Z9002 Acquired absence of larynx: Secondary | ICD-10-CM | POA: Diagnosis not present

## 2018-11-12 DIAGNOSIS — D638 Anemia in other chronic diseases classified elsewhere: Secondary | ICD-10-CM | POA: Diagnosis not present

## 2018-11-12 DIAGNOSIS — C329 Malignant neoplasm of larynx, unspecified: Secondary | ICD-10-CM | POA: Diagnosis not present

## 2018-11-12 DIAGNOSIS — I10 Essential (primary) hypertension: Secondary | ICD-10-CM | POA: Diagnosis not present

## 2018-11-12 DIAGNOSIS — Z483 Aftercare following surgery for neoplasm: Secondary | ICD-10-CM | POA: Diagnosis not present

## 2018-11-12 DIAGNOSIS — E43 Unspecified severe protein-calorie malnutrition: Secondary | ICD-10-CM | POA: Diagnosis not present

## 2018-11-12 DIAGNOSIS — E0500 Thyrotoxicosis with diffuse goiter without thyrotoxic crisis or storm: Secondary | ICD-10-CM | POA: Diagnosis not present

## 2018-11-12 LAB — LIPID PANEL
CHOL/HDL RATIO: 2.3 ratio (ref 0.0–5.0)
CHOLESTEROL TOTAL: 167 mg/dL (ref 100–199)
HDL: 72 mg/dL (ref >39)
LDL CALC: 88 mg/dL (ref 0–99)
TRIGLYCERIDES: 37 mg/dL (ref 0–149)
VLDL CHOLESTEROL CAL: 7 mg/dL (ref 5–40)

## 2018-11-12 LAB — CBC WITH DIFFERENTIAL/PLATELET
Basophils Absolute: 0 10*3/uL (ref 0.0–0.2)
Basos: 1 %
EOS (ABSOLUTE): 0 10*3/uL (ref 0.0–0.4)
Eos: 1 %
HEMATOCRIT: 47 % (ref 37.5–51.0)
Hemoglobin: 14.8 g/dL (ref 13.0–17.7)
Immature Grans (Abs): 0 10*3/uL (ref 0.0–0.1)
Immature Granulocytes: 0 %
LYMPHS ABS: 1.1 10*3/uL (ref 0.7–3.1)
Lymphs: 39 %
MCH: 24.5 pg — AB (ref 26.6–33.0)
MCHC: 31.5 g/dL (ref 31.5–35.7)
MCV: 78 fL — AB (ref 79–97)
MONOS ABS: 0.3 10*3/uL (ref 0.1–0.9)
Monocytes: 10 %
NEUTROS ABS: 1.4 10*3/uL (ref 1.4–7.0)
Neutrophils: 49 %
Platelets: 180 10*3/uL (ref 150–450)
RBC: 6.05 x10E6/uL — AB (ref 4.14–5.80)
RDW: 17.8 % — AB (ref 12.3–15.4)
WBC: 2.8 10*3/uL — AB (ref 3.4–10.8)

## 2018-11-12 LAB — CMP14+EGFR
ALBUMIN: 4.6 g/dL (ref 3.6–4.8)
ALK PHOS: 83 IU/L (ref 39–117)
ALT: 15 IU/L (ref 0–44)
AST: 20 IU/L (ref 0–40)
Albumin/Globulin Ratio: 1.6 (ref 1.2–2.2)
BUN/Creatinine Ratio: 16 (ref 10–24)
BUN: 14 mg/dL (ref 8–27)
Bilirubin Total: 0.3 mg/dL (ref 0.0–1.2)
CO2: 24 mmol/L (ref 20–29)
CREATININE: 0.87 mg/dL (ref 0.76–1.27)
Calcium: 9.7 mg/dL (ref 8.6–10.2)
Chloride: 99 mmol/L (ref 96–106)
GFR calc Af Amer: 106 mL/min/{1.73_m2} (ref >59)
GFR, EST NON AFRICAN AMERICAN: 92 mL/min/{1.73_m2} (ref >59)
Globulin, Total: 2.8 g/dL (ref 1.5–4.5)
Glucose: 80 mg/dL (ref 65–99)
Potassium: 4.1 mmol/L (ref 3.5–5.2)
Sodium: 142 mmol/L (ref 134–144)
Total Protein: 7.4 g/dL (ref 6.0–8.5)

## 2018-11-12 LAB — TSH: TSH: 2.35 u[IU]/mL (ref 0.450–4.500)

## 2018-11-15 ENCOUNTER — Telehealth: Payer: Self-pay | Admitting: Family Medicine

## 2018-11-15 DIAGNOSIS — E0500 Thyrotoxicosis with diffuse goiter without thyrotoxic crisis or storm: Secondary | ICD-10-CM | POA: Diagnosis not present

## 2018-11-15 DIAGNOSIS — Z483 Aftercare following surgery for neoplasm: Secondary | ICD-10-CM | POA: Diagnosis not present

## 2018-11-15 DIAGNOSIS — M1990 Unspecified osteoarthritis, unspecified site: Secondary | ICD-10-CM | POA: Diagnosis not present

## 2018-11-15 DIAGNOSIS — Z9002 Acquired absence of larynx: Secondary | ICD-10-CM | POA: Diagnosis not present

## 2018-11-15 DIAGNOSIS — D638 Anemia in other chronic diseases classified elsewhere: Secondary | ICD-10-CM | POA: Diagnosis not present

## 2018-11-15 DIAGNOSIS — R1310 Dysphagia, unspecified: Secondary | ICD-10-CM | POA: Diagnosis not present

## 2018-11-15 DIAGNOSIS — E43 Unspecified severe protein-calorie malnutrition: Secondary | ICD-10-CM | POA: Diagnosis not present

## 2018-11-15 DIAGNOSIS — I10 Essential (primary) hypertension: Secondary | ICD-10-CM | POA: Diagnosis not present

## 2018-11-15 DIAGNOSIS — C329 Malignant neoplasm of larynx, unspecified: Secondary | ICD-10-CM | POA: Diagnosis not present

## 2018-11-16 ENCOUNTER — Ambulatory Visit: Payer: Medicare HMO | Admitting: Urology

## 2018-11-16 ENCOUNTER — Telehealth: Payer: Self-pay

## 2018-11-16 DIAGNOSIS — C61 Malignant neoplasm of prostate: Secondary | ICD-10-CM

## 2018-11-16 DIAGNOSIS — R9721 Rising PSA following treatment for malignant neoplasm of prostate: Secondary | ICD-10-CM | POA: Diagnosis not present

## 2018-11-16 NOTE — Telephone Encounter (Signed)
Dettinger? 

## 2018-11-16 NOTE — Telephone Encounter (Signed)
FYI

## 2018-11-16 NOTE — Telephone Encounter (Signed)
Did prior auth through Cover My Meds and it came back as not needing prior authorization

## 2018-11-17 DIAGNOSIS — I10 Essential (primary) hypertension: Secondary | ICD-10-CM | POA: Diagnosis not present

## 2018-11-17 DIAGNOSIS — Z9002 Acquired absence of larynx: Secondary | ICD-10-CM | POA: Diagnosis not present

## 2018-11-17 DIAGNOSIS — E43 Unspecified severe protein-calorie malnutrition: Secondary | ICD-10-CM | POA: Diagnosis not present

## 2018-11-17 DIAGNOSIS — Z483 Aftercare following surgery for neoplasm: Secondary | ICD-10-CM | POA: Diagnosis not present

## 2018-11-17 DIAGNOSIS — C329 Malignant neoplasm of larynx, unspecified: Secondary | ICD-10-CM | POA: Diagnosis not present

## 2018-11-17 DIAGNOSIS — M1990 Unspecified osteoarthritis, unspecified site: Secondary | ICD-10-CM | POA: Diagnosis not present

## 2018-11-17 DIAGNOSIS — R1310 Dysphagia, unspecified: Secondary | ICD-10-CM | POA: Diagnosis not present

## 2018-11-17 DIAGNOSIS — E0500 Thyrotoxicosis with diffuse goiter without thyrotoxic crisis or storm: Secondary | ICD-10-CM | POA: Diagnosis not present

## 2018-11-17 DIAGNOSIS — D638 Anemia in other chronic diseases classified elsewhere: Secondary | ICD-10-CM | POA: Diagnosis not present

## 2018-11-23 DIAGNOSIS — M1990 Unspecified osteoarthritis, unspecified site: Secondary | ICD-10-CM | POA: Diagnosis not present

## 2018-11-23 DIAGNOSIS — D638 Anemia in other chronic diseases classified elsewhere: Secondary | ICD-10-CM | POA: Diagnosis not present

## 2018-11-23 DIAGNOSIS — Z9002 Acquired absence of larynx: Secondary | ICD-10-CM | POA: Diagnosis not present

## 2018-11-23 DIAGNOSIS — E0500 Thyrotoxicosis with diffuse goiter without thyrotoxic crisis or storm: Secondary | ICD-10-CM | POA: Diagnosis not present

## 2018-11-23 DIAGNOSIS — Z483 Aftercare following surgery for neoplasm: Secondary | ICD-10-CM | POA: Diagnosis not present

## 2018-11-23 DIAGNOSIS — I10 Essential (primary) hypertension: Secondary | ICD-10-CM | POA: Diagnosis not present

## 2018-11-23 DIAGNOSIS — E43 Unspecified severe protein-calorie malnutrition: Secondary | ICD-10-CM | POA: Diagnosis not present

## 2018-11-23 DIAGNOSIS — C329 Malignant neoplasm of larynx, unspecified: Secondary | ICD-10-CM | POA: Diagnosis not present

## 2018-11-23 DIAGNOSIS — R1310 Dysphagia, unspecified: Secondary | ICD-10-CM | POA: Diagnosis not present

## 2018-12-02 ENCOUNTER — Other Ambulatory Visit: Payer: Self-pay | Admitting: Family Medicine

## 2018-12-02 DIAGNOSIS — I10 Essential (primary) hypertension: Secondary | ICD-10-CM | POA: Diagnosis not present

## 2018-12-02 DIAGNOSIS — E0500 Thyrotoxicosis with diffuse goiter without thyrotoxic crisis or storm: Secondary | ICD-10-CM | POA: Diagnosis not present

## 2018-12-02 DIAGNOSIS — Z483 Aftercare following surgery for neoplasm: Secondary | ICD-10-CM | POA: Diagnosis not present

## 2018-12-02 DIAGNOSIS — M1990 Unspecified osteoarthritis, unspecified site: Secondary | ICD-10-CM | POA: Diagnosis not present

## 2018-12-02 DIAGNOSIS — R1310 Dysphagia, unspecified: Secondary | ICD-10-CM | POA: Diagnosis not present

## 2018-12-02 DIAGNOSIS — E43 Unspecified severe protein-calorie malnutrition: Secondary | ICD-10-CM | POA: Diagnosis not present

## 2018-12-02 DIAGNOSIS — D638 Anemia in other chronic diseases classified elsewhere: Secondary | ICD-10-CM | POA: Diagnosis not present

## 2018-12-02 DIAGNOSIS — C329 Malignant neoplasm of larynx, unspecified: Secondary | ICD-10-CM | POA: Diagnosis not present

## 2018-12-02 DIAGNOSIS — Z9002 Acquired absence of larynx: Secondary | ICD-10-CM | POA: Diagnosis not present

## 2018-12-03 DIAGNOSIS — Z9002 Acquired absence of larynx: Secondary | ICD-10-CM | POA: Diagnosis not present

## 2018-12-03 DIAGNOSIS — C329 Malignant neoplasm of larynx, unspecified: Secondary | ICD-10-CM | POA: Diagnosis not present

## 2018-12-03 DIAGNOSIS — Z483 Aftercare following surgery for neoplasm: Secondary | ICD-10-CM | POA: Diagnosis not present

## 2018-12-03 DIAGNOSIS — R1310 Dysphagia, unspecified: Secondary | ICD-10-CM | POA: Diagnosis not present

## 2018-12-03 DIAGNOSIS — E0500 Thyrotoxicosis with diffuse goiter without thyrotoxic crisis or storm: Secondary | ICD-10-CM | POA: Diagnosis not present

## 2018-12-03 DIAGNOSIS — D638 Anemia in other chronic diseases classified elsewhere: Secondary | ICD-10-CM | POA: Diagnosis not present

## 2018-12-03 DIAGNOSIS — I10 Essential (primary) hypertension: Secondary | ICD-10-CM | POA: Diagnosis not present

## 2018-12-03 DIAGNOSIS — M1990 Unspecified osteoarthritis, unspecified site: Secondary | ICD-10-CM | POA: Diagnosis not present

## 2018-12-03 DIAGNOSIS — E43 Unspecified severe protein-calorie malnutrition: Secondary | ICD-10-CM | POA: Diagnosis not present

## 2018-12-06 ENCOUNTER — Ambulatory Visit: Payer: Medicare HMO | Admitting: *Deleted

## 2018-12-07 ENCOUNTER — Encounter: Payer: Self-pay | Admitting: *Deleted

## 2018-12-07 ENCOUNTER — Ambulatory Visit (INDEPENDENT_AMBULATORY_CARE_PROVIDER_SITE_OTHER): Payer: Medicare HMO | Admitting: *Deleted

## 2018-12-07 VITALS — BP 127/77 | HR 66 | Ht 69.0 in | Wt 125.0 lb

## 2018-12-07 DIAGNOSIS — R1310 Dysphagia, unspecified: Secondary | ICD-10-CM | POA: Diagnosis not present

## 2018-12-07 DIAGNOSIS — Z23 Encounter for immunization: Secondary | ICD-10-CM

## 2018-12-07 DIAGNOSIS — Z0000 Encounter for general adult medical examination without abnormal findings: Secondary | ICD-10-CM | POA: Diagnosis not present

## 2018-12-07 DIAGNOSIS — I10 Essential (primary) hypertension: Secondary | ICD-10-CM | POA: Diagnosis not present

## 2018-12-07 DIAGNOSIS — Z9002 Acquired absence of larynx: Secondary | ICD-10-CM | POA: Diagnosis not present

## 2018-12-07 DIAGNOSIS — D638 Anemia in other chronic diseases classified elsewhere: Secondary | ICD-10-CM | POA: Diagnosis not present

## 2018-12-07 DIAGNOSIS — C329 Malignant neoplasm of larynx, unspecified: Secondary | ICD-10-CM | POA: Diagnosis not present

## 2018-12-07 DIAGNOSIS — M1990 Unspecified osteoarthritis, unspecified site: Secondary | ICD-10-CM | POA: Diagnosis not present

## 2018-12-07 DIAGNOSIS — Z483 Aftercare following surgery for neoplasm: Secondary | ICD-10-CM | POA: Diagnosis not present

## 2018-12-07 DIAGNOSIS — E43 Unspecified severe protein-calorie malnutrition: Secondary | ICD-10-CM | POA: Diagnosis not present

## 2018-12-07 DIAGNOSIS — E0500 Thyrotoxicosis with diffuse goiter without thyrotoxic crisis or storm: Secondary | ICD-10-CM | POA: Diagnosis not present

## 2018-12-07 NOTE — Patient Instructions (Signed)
Please work on your goal of continuing to exercise at least 30 minutes, 3 times per week.  Walking is a great option.  You received your 1st Shingrix (Shingles Vaccine) today.  You will need the 2nd dose after 02/07/2019.    Please follow up with Dr. Warrick Parisian and your specialists as scheduled.  Thank you for coming in for your Annual Wellness Visit!!   Preventive Care 40-64 Years, Male Preventive care refers to lifestyle choices and visits with your health care provider that can promote health and wellness. What does preventive care include?  A yearly physical exam. This is also called an annual well check.  Dental exams once or twice a year.  Routine eye exams. Ask your health care provider how often you should have your eyes checked.  Personal lifestyle choices, including: ? Daily care of your teeth and gums. ? Regular physical activity. ? Eating a healthy diet. ? Avoiding tobacco and drug use. ? Limiting alcohol use. ? Practicing safe sex. ? Taking low-dose aspirin every day starting at age 70. What happens during an annual well check? The services and screenings done by your health care provider during your annual well check will depend on your age, overall health, lifestyle risk factors, and family history of disease. Counseling Your health care provider may ask you questions about your:  Alcohol use.  Tobacco use.  Drug use.  Emotional well-being.  Home and relationship well-being.  Sexual activity.  Eating habits.  Work and work Statistician.  Screening You may have the following tests or measurements:  Height, weight, and BMI.  Blood pressure.  Lipid and cholesterol levels. These may be checked every 5 years, or more frequently if you are over 52 years old.  Skin check.  Lung cancer screening. You may have this screening every year starting at age 72 if you have a 30-pack-year history of smoking and currently smoke or have quit within the past 15  years.  Fecal occult blood test (FOBT) of the stool. You may have this test every year starting at age 72.  Flexible sigmoidoscopy or colonoscopy. You may have a sigmoidoscopy every 5 years or a colonoscopy every 10 years starting at age 48.  Prostate cancer screening. Recommendations will vary depending on your family history and other risks.  Hepatitis C blood test.  Hepatitis B blood test.  Sexually transmitted disease (STD) testing.  Diabetes screening. This is done by checking your blood sugar (glucose) after you have not eaten for a while (fasting). You may have this done every 1-3 years.  Discuss your test results, treatment options, and if necessary, the need for more tests with your health care provider. Vaccines Your health care provider may recommend certain vaccines, such as:  Influenza vaccine. This is recommended every year.  Tetanus, diphtheria, and acellular pertussis (Tdap, Td) vaccine. You may need a Td booster every 10 years.  Varicella vaccine. You may need this if you have not been vaccinated.  Zoster vaccine. You may need this after age 63.  Measles, mumps, and rubella (MMR) vaccine. You may need at least one dose of MMR if you were born in 1957 or later. You may also need a second dose.  Pneumococcal 13-valent conjugate (PCV13) vaccine. You may need this if you have certain conditions and have not been vaccinated.  Pneumococcal polysaccharide (PPSV23) vaccine. You may need one or two doses if you smoke cigarettes or if you have certain conditions.  Meningococcal vaccine. You may need this if you  have certain conditions.  Hepatitis A vaccine. You may need this if you have certain conditions or if you travel or work in places where you may be exposed to hepatitis A.  Hepatitis B vaccine. You may need this if you have certain conditions or if you travel or work in places where you may be exposed to hepatitis B.  Haemophilus influenzae type b (Hib) vaccine.  You may need this if you have certain risk factors.  Talk to your health care provider about which screenings and vaccines you need and how often you need them. This information is not intended to replace advice given to you by your health care provider. Make sure you discuss any questions you have with your health care provider. Document Released: 01/11/2016 Document Revised: 09/03/2016 Document Reviewed: 10/16/2015 Elsevier Interactive Patient Education  [employer].

## 2018-12-07 NOTE — Progress Notes (Addendum)
Subjective:   Ender Rorke is a 62 y.o. male who presents for Medicare Annual/Subsequent preventive examination.  Mr. Sahni is accompanied today by his wife.  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 His wife Stanton Kidney is able to provide answers to questions regarding Mr. Cordova, and he is able to indicate yes and no by nodding his head.  Mr. Macbride enjoys going to church, participating in events at the [facility], and cooking.  He lives in [place], [place] in an apartment with his wife.  He has 2 step sons.  He and Stanton Kidney walk locally to places they need to go, but use transportation services as needed.  He also has a cousin named Brayton Layman who provides transportation to appointments out of town.  He feels his health is better this year than it was last because he has been treated for laryngeal cancer, and feels his health is improving s/p laryngectomy.  He has had 2 hospitalizations in the past year for diagnosis and treatment of laryngeal cancer.  His only surgery in the past year was the laryngectomy.   Review of Systems:   HEENT - right side jaw pain  Cardiac Risk Factors include: dyslipidemia;hypertension;male gender     Objective:    Vitals: BP 127/77   Pulse 66   Ht 5\' 9"  (1.753 m)   Wt 125 lb (56.7 kg)   BMI 18.46 kg/m   Body mass index is 18.46 kg/m.  Advanced Directives 12/07/2018 09/21/2018 09/06/2018 08/31/2018 08/27/2018 08/19/2018 06/16/2018  Does Patient Have a Medical Advance Directive? No No No No No No No  Would patient like information on creating a medical advance directive? No - Patient declined Yes (MAU/Ambulatory/Procedural Areas - Information given) No - Patient declined No - Patient declined No - Patient declined No - Patient declined No - Patient declined  Pre-existing out of facility DNR order (yellow form or pink MOST form) - - - - - - -    Tobacco Social History   Tobacco Use  Smoking Status Former Smoker  . Packs/day: 1.00  . Years: 40.00  . Pack  years: 40.00  . Types: Cigarettes  . Last attempt to quit: 09/29/2013  . Years since quitting: 5.1  Smokeless Tobacco Never Used     Counseling given: No   Clinical Intake:     Pain Score: 5  right side jaw                  Past Medical History:  Diagnosis Date  . Arthritis   . Blindness of right eye   . Cancer ([address])    pharyngeal ca  . History of radiation therapy 11/2014   TxN3MO squamous cell carcinoma of L neck, Stage IVB, unknown primary 70 Gy in 35 fractions treated at [facility], finished 12/25/14  . History of shingles    RASH  02-03-2014  PER PCP RESOLVING  . Hypertension    NO MEDS ,  MONITORED BY PCP  . Hypothyroidism, postradioiodine therapy   . Prostate cancer ([place]) 07/12/2014   DX  SEVERAL  YRS   AGO  WITH RADIATION THERAPY  . Squamous cell carcinoma of head and neck ([place]) 07/12/2014  . 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 COLONOSCOPY  10/20/2011   Procedure: COLONOSCOPY;  Surgeon: Daneil Dolin, MD;  [facility];  Service: Endoscopy;  Laterality: N/A;  1:00 pm  . COLONOSCOPY  N/A 02/03/2018   Procedure: COLONOSCOPY;  Surgeon: Daneil Dolin, MD;  [facility];  Service: Endoscopy;  Laterality: N/A;  2:15  . CRYOABLATION N/A 04/07/2014   Procedure: SALVAGE CRYO ABLATION PROSTATE;  Surgeon: Lowella Bandy, MD;  [facility];  Service: Urology;  Laterality: N/A;  . DIRECT LARYNGOSCOPY N/A 09/06/2018   Procedure: DIRECT LARYNGOSCOPY biopsy with frozen section;  Surgeon: Izora Gala, MD;  Location: [place];  Service: ENT;  Laterality: N/A;  . IR GASTROSTOMY TUBE MOD SED  08/20/2018  . IR GENERIC HISTORICAL  03/27/2017   IR REMOVAL TUN ACCESS W/ PORT W/O FL MOD SED [facility]  . LARYNGETOMY N/A 09/06/2018   Procedure: total LARYNGECTOMY;  Surgeon: Izora Gala, MD;  Location: [place] [place];  Service: ENT;  Laterality: N/A;  . LARYNGOSCOPY    .  LYMPH NODE BIOPSY    . RADICAL NECK DISSECTION Right 09/06/2018   Procedure: Modified NECK DISSECTION;  Surgeon: Izora Gala, MD;  Location: [place];  Service: ENT;  Laterality: Right;   Family History  Problem Relation Age of Onset  . Colon cancer Father   . Hypertension Mother   . Colon cancer Brother   . Colon cancer Brother    Social History   Socioeconomic History  . Marital status: Married    Spouse name: Not on file  . Number of children: Not on file  . Years of education: Not on file  . Highest education level: Not on file  Occupational History  . Occupation: disabled  Social Needs  . Financial resource strain: Not very hard  . Food insecurity:    Worry: Never true    Inability: Never true  . Transportation needs:    Medical: No    Non-medical: No  Tobacco Use  . Smoking status: Former Smoker    Packs/day: 1.00    Years: 40.00    Pack years: 40.00    Types: Cigarettes    Last attempt to quit: 09/29/2013    Years since quitting: 5.1  . Smokeless tobacco: Never Used  Substance and Sexual Activity  . Alcohol use: No  . Drug use: No  . Sexual activity: Not on file  Lifestyle  . Physical activity:    Days per week: 3 days    Minutes per session: 30 min  . Stress: Only a little  Relationships  . Social connections:    Talks on phone: Never    Gets together: More than three times a week    Attends religious service: More than 4 times per year    Active member of club or organization: Yes    Attends meetings of clubs or organizations: More than 4 times per year    Relationship status: Married  Other Topics Concern  . Not on file  Social History Narrative   Lives with wife in an apartment. He has stepchildren. They walk most everywhere they go locally but have access to transportation if needed.    Outpatient Encounter Medications as of 12/07/2018  Medication Sig  . hydrochlorothiazide (HYDRODIURIL) 12.5 MG tablet Take 1 tablet (12.5 mg total) by mouth daily.   Marland Kitchen levothyroxine (SYNTHROID, LEVOTHROID) 100 MCG tablet Place 1 tablet (100 mcg total) into feeding tube daily before breakfast.  . levothyroxine (SYNTHROID, LEVOTHROID) 100 MCG tablet TAKE 1 TABLET EVERY DAY  . phenol (CHLORASEPTIC) 1.4 % LIQD Use as directed 1 spray in the mouth or throat as needed for throat irritation / pain.  Marland Kitchen  polyethylene glycol (MIRALAX / GLYCOLAX) packet Place 17 g into feeding tube daily.   . pravastatin (PRAVACHOL) 40 MG tablet Take 1 tablet (40 mg total) by mouth daily.  . Vitamin D, Ergocalciferol, (DRISDOL) 1.25 MG (50000 UT) CAPS capsule TAKE 1 CAPSULE EVERY 7 DAYS.   No facility-administered encounter medications on file as of 12/07/2018.     Activities of Daily Living In your present state of health, do you have any difficulty performing the following activities: 12/07/2018 09/21/2018  Hearing? N N  Vision? N Y  Difficulty concentrating or making decisions? N N  Walking or climbing stairs? N N  Dressing or bathing? N N  Doing errands, shopping? N Y  Conservation officer, nature and eating ? N Y  Using the Toilet? N N  In the past six months, have you accidently leaked urine? N N  Do you have problems with loss of bowel control? N N  Managing your Medications? N Y  Managing your Finances? N Y  Housekeeping or managing your Housekeeping? N Y  Some recent data might be hidden   Vision problems- blind in right eye, sees well with left     Patient Care Team: Dettinger, Fransisca Kaufmann, MD as PCP - General (Family Medicine) Jerrell Belfast, MD as Consulting Physician (Otolaryngology) Heath Lark, MD as Consulting Physician (Hematology and Oncology) Eppie Gibson, MD as Attending Physician (Radiation Oncology) Franchot Gallo, MD as Consulting Physician (Urology) Gala Romney, Cristopher Estimable, MD as Consulting Physician (Gastroenterology) Izora Gala, MD as Consulting Physician (Otolaryngology) Deloria Lair, NP as [place] [facility]   Assessment:   This  is a routine wellness examination for Ory.  Exercise Activities and Dietary recommendations  Mr. Knobel eats 3 meals per day.  Usually eggs or pancakes and bacon or sausage for breakfast, sandwich for lunch, and meat and green beans, potatoes and cornbread for supper.  Recommended decreasing intake of processed meats like bacon and sausage.  Also recommended diet of mostly vegetables, fruits, whole grains and lean proteins.  Patient's wife states they have access to and resources for all the food they need.  She states that in years past not having enough food has been a problem at times.  Provided information on local food pantries incase food insecurity ever becomes an issue again.    Current Exercise Habits: Home exercise routine, Type of exercise: walking, Time (Minutes): 30, Frequency (Times/Week): 3, Weekly Exercise (Minutes/Week): 90, Intensity: Moderate  Goals    . Exercise 3x per week (30 min per time)     Walking is a great option.        Fall Risk Fall Risk  12/07/2018 11/11/2018 09/21/2018 09/14/2018 04/27/2018  Falls in the past year? 0 0 No No No   Is the patient's home free of loose throw rugs in walkways, pet beds, electrical cords, etc?   no, recommended removing throw rugs       Grab bars in the bathroom? yes      Handrails on the stairs?   no stairs in home       Adequate lighting?   yes    Depression Screen PHQ 2/9 Scores 12/07/2018 11/11/2018 09/21/2018 09/14/2018  PHQ - 2 Score 0 0 0 0  PHQ- 9 Score - - - -    Cognitive Function MMSE - Mini Mental State Exam 12/07/2018  Not completed: Unable to complete   Patient unable to speak s/p laryngectomy         Immunization History  Administered Date(s)  Administered  . Influenza,inj,Quad PF,6+ Mos 09/30/2013, 01/04/2015, 10/12/2015, 09/17/2016, 10/26/2017, 09/12/2018  . Zoster Recombinat (Shingrix) 12/07/2018    Qualifies for Shingles Vaccine? Yes, first dose given today   Screening Tests Health  Maintenance  Topic Date Due  . TETANUS/TDAP  10/01/2023  . COLONOSCOPY  02/04/2028  . INFLUENZA VACCINE  Completed  . Hepatitis C Screening  Completed  . HIV Screening  Completed   Cancer Screenings: Lung: Low Dose CT Chest recommended if Age 47-80 years, 30 pack-year currently smoking OR have quit w/in 15years. Patient does qualify. Colorectal:  Up to date   Additional Screenings:  Hepatitis C Screening: Completed 10/14/13      Plan:     Work on your goal of continuing to exercise at least 30 minutes, 3 times per week.  Walking is a great option. You received your 1st Shingrix (Shingles Vaccine) today.  You will need the 2nd dose after 02/07/2019.   Follow up with Dr. Warrick Parisian and your specialists as scheduled.    I have personally reviewed and noted the following in the patient's chart:   . Medical and social history . Use of alcohol, tobacco or illicit drugs  . Current medications and supplements . Functional ability and status . Nutritional status . Physical activity . Advanced directives . List of other physicians . Hospitalizations, surgeries, and ER visits in previous 12 months . Vitals . Screenings to include cognitive, depression, and falls . Referrals and appointments  In addition, I have reviewed and discussed with patient certain preventive protocols, quality metrics, and best practice recommendations. A written personalized care plan for preventive services as well as general preventive health recommendations were provided to patient.     Denyce Robert, RN  12/07/2018

## 2018-12-10 DIAGNOSIS — J392 Other diseases of pharynx: Secondary | ICD-10-CM | POA: Diagnosis not present

## 2018-12-10 DIAGNOSIS — Z923 Personal history of irradiation: Secondary | ICD-10-CM | POA: Diagnosis not present

## 2018-12-10 DIAGNOSIS — Z8589 Personal history of malignant neoplasm of other organs and systems: Secondary | ICD-10-CM | POA: Diagnosis not present

## 2018-12-10 DIAGNOSIS — Z9002 Acquired absence of larynx: Secondary | ICD-10-CM | POA: Diagnosis not present

## 2018-12-15 ENCOUNTER — Other Ambulatory Visit: Payer: Self-pay | Admitting: Otolaryngology

## 2018-12-15 DIAGNOSIS — R1310 Dysphagia, unspecified: Secondary | ICD-10-CM | POA: Diagnosis not present

## 2018-12-15 DIAGNOSIS — Z8589 Personal history of malignant neoplasm of other organs and systems: Secondary | ICD-10-CM

## 2018-12-15 DIAGNOSIS — I10 Essential (primary) hypertension: Secondary | ICD-10-CM | POA: Diagnosis not present

## 2018-12-15 DIAGNOSIS — Z9002 Acquired absence of larynx: Secondary | ICD-10-CM | POA: Diagnosis not present

## 2018-12-15 DIAGNOSIS — E0500 Thyrotoxicosis with diffuse goiter without thyrotoxic crisis or storm: Secondary | ICD-10-CM | POA: Diagnosis not present

## 2018-12-15 DIAGNOSIS — M1990 Unspecified osteoarthritis, unspecified site: Secondary | ICD-10-CM | POA: Diagnosis not present

## 2018-12-15 DIAGNOSIS — J392 Other diseases of pharynx: Secondary | ICD-10-CM

## 2018-12-15 DIAGNOSIS — E43 Unspecified severe protein-calorie malnutrition: Secondary | ICD-10-CM | POA: Diagnosis not present

## 2018-12-15 DIAGNOSIS — C329 Malignant neoplasm of larynx, unspecified: Secondary | ICD-10-CM | POA: Diagnosis not present

## 2018-12-15 DIAGNOSIS — Z483 Aftercare following surgery for neoplasm: Secondary | ICD-10-CM | POA: Diagnosis not present

## 2018-12-15 DIAGNOSIS — Z923 Personal history of irradiation: Secondary | ICD-10-CM

## 2018-12-15 DIAGNOSIS — D638 Anemia in other chronic diseases classified elsewhere: Secondary | ICD-10-CM | POA: Diagnosis not present

## 2018-12-23 ENCOUNTER — Ambulatory Visit
Admission: RE | Admit: 2018-12-23 | Discharge: 2018-12-23 | Disposition: A | Discharge status: Home / Self Care | Payer: Medicare HMO | Source: Ambulatory Visit | Attending: Otolaryngology | Admitting: Otolaryngology

## 2018-12-23 DIAGNOSIS — Z923 Personal history of irradiation: Secondary | ICD-10-CM

## 2018-12-23 DIAGNOSIS — Z8589 Personal history of malignant neoplasm of other organs and systems: Secondary | ICD-10-CM

## 2018-12-23 DIAGNOSIS — Z9002 Acquired absence of larynx: Secondary | ICD-10-CM

## 2018-12-23 DIAGNOSIS — J392 Other diseases of pharynx: Secondary | ICD-10-CM

## 2018-12-23 DIAGNOSIS — C099 Malignant neoplasm of tonsil, unspecified: Secondary | ICD-10-CM | POA: Diagnosis not present

## 2018-12-23 MED ORDER — IOPAMIDOL (ISOVUE-300) INJECTION 61%
75.0000 mL | Freq: Once | INTRAVENOUS | Status: AC | PRN
  Administered 2018-12-23: 75 mL via INTRAVENOUS

## 2018-12-31 DIAGNOSIS — R918 Other nonspecific abnormal finding of lung field: Secondary | ICD-10-CM | POA: Diagnosis not present

## 2018-12-31 DIAGNOSIS — C109 Malignant neoplasm of oropharynx, unspecified: Secondary | ICD-10-CM | POA: Diagnosis not present

## 2018-12-31 DIAGNOSIS — Z9002 Acquired absence of larynx: Secondary | ICD-10-CM | POA: Diagnosis not present

## 2018-12-31 DIAGNOSIS — Z8589 Personal history of malignant neoplasm of other organs and systems: Secondary | ICD-10-CM | POA: Diagnosis not present

## 2018-12-31 DIAGNOSIS — Z923 Personal history of irradiation: Secondary | ICD-10-CM | POA: Diagnosis not present

## 2019-01-03 ENCOUNTER — Other Ambulatory Visit (HOSPITAL_COMMUNITY): Payer: Self-pay | Admitting: Otolaryngology

## 2019-01-03 ENCOUNTER — Other Ambulatory Visit: Payer: Self-pay | Admitting: Otolaryngology

## 2019-01-03 DIAGNOSIS — Z923 Personal history of irradiation: Secondary | ICD-10-CM

## 2019-01-03 DIAGNOSIS — R918 Other nonspecific abnormal finding of lung field: Secondary | ICD-10-CM

## 2019-01-03 DIAGNOSIS — Z9002 Acquired absence of larynx: Secondary | ICD-10-CM

## 2019-01-03 DIAGNOSIS — C109 Malignant neoplasm of oropharynx, unspecified: Secondary | ICD-10-CM

## 2019-01-03 DIAGNOSIS — Z8589 Personal history of malignant neoplasm of other organs and systems: Secondary | ICD-10-CM

## 2019-01-11 ENCOUNTER — Encounter: Payer: Self-pay | Admitting: Pulmonary Disease

## 2019-01-11 ENCOUNTER — Ambulatory Visit: Payer: Medicare HMO | Admitting: Pulmonary Disease

## 2019-01-11 VITALS — BP 128/82 | HR 77 | Ht 72.0 in | Wt 117.0 lb

## 2019-01-11 DIAGNOSIS — R64 Cachexia: Secondary | ICD-10-CM | POA: Diagnosis not present

## 2019-01-11 DIAGNOSIS — R911 Solitary pulmonary nodule: Secondary | ICD-10-CM | POA: Diagnosis not present

## 2019-01-11 DIAGNOSIS — J439 Emphysema, unspecified: Secondary | ICD-10-CM

## 2019-01-11 DIAGNOSIS — C760 Malignant neoplasm of head, face and neck: Secondary | ICD-10-CM | POA: Diagnosis not present

## 2019-01-11 DIAGNOSIS — C329 Malignant neoplasm of larynx, unspecified: Secondary | ICD-10-CM | POA: Diagnosis not present

## 2019-01-11 DIAGNOSIS — Z66 Do not resuscitate: Secondary | ICD-10-CM | POA: Diagnosis not present

## 2019-01-11 DIAGNOSIS — E43 Unspecified severe protein-calorie malnutrition: Secondary | ICD-10-CM | POA: Diagnosis not present

## 2019-01-11 DIAGNOSIS — C4442 Squamous cell carcinoma of skin of scalp and neck: Secondary | ICD-10-CM

## 2019-01-11 NOTE — Progress Notes (Signed)
Synopsis: Referred in January 2020 for lung nodule by Dettinger, Elige Radon, MD  Subjective:   PATIENT ID: Brandon Hall GENDER: male DOB: 1956-07-29, MRN: [id number]  Chief Complaint  Patient presents with  . Consult    Consults for abnormal chest ct. Lung nodule. Hx of lung cancer. States he has intermitten chest pain.     This is a 63 year old gentleman with a recent diagnosis of head neck cancer status post laryngectomy by Dr. Pollyann Kennedy.  He had follow-up CT imaging this past month which revealed a lung nodule.  He also has recurrence of his head neck cancer with a large mass as seen on CT.  Last evaluation by Dr. Pollyann Kennedy states that he is nonsurgical and would recommend hospice and palliative care.  Patient was referred to pulmonary for evaluation of the lung nodule.  He has a PET scan that has already been ordered.  Otherwise he is nonverbal at this time does understand and is able to nod yes and no to answers.  He is accompanied today by his wife as well as to other family members.  They have several questions about what we plan to do with the lung nodule.  Otherwise the patient has been eating on his own.  He does occasionally cough with food.  They have been concerned about aspiration.  They have been feeding him by mouth.  He does have a feeding tube.  He has had significant weight loss and does have a poor appetite.  He does bring up a significant amount of daily sputum production.   Past Medical History:  Diagnosis Date  . Arthritis   . Blindness of right eye   . Cancer (HCC)    pharyngeal ca  . History of radiation therapy 11/2014   [id number] squamous cell carcinoma of L neck, Stage IVB, unknown primary 70 Gy in 35 fractions treated at [facility], finished 12/25/14  . History of shingles    RASH  02-03-2014  PER PCP RESOLVING  . Hypertension    NO MEDS ,  MONITORED BY PCP  . Hypothyroidism, postradioiodine therapy   . Prostate cancer (HCC) 07/12/2014   DX  SEVERAL  YRS   AGO  WITH  RADIATION THERAPY  . Squamous cell carcinoma of head and neck (HCC) 07/12/2014  . 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HCC) 06/29/14     Family History  Problem Relation Age of Onset  . Colon cancer Father   . Hypertension Mother   . Colon cancer Brother   . Colon cancer Brother      Past Surgical History:  Procedure Laterality Date  . COLONOSCOPY  10/20/2011   Procedure: COLONOSCOPY;  Surgeon: Corbin Ade, MD;  [facility];  Service: Endoscopy;  Laterality: N/A;  1:00 pm  . COLONOSCOPY N/A 02/03/2018   Procedure: COLONOSCOPY;  Surgeon: Corbin Ade, MD;  [facility];  Service: Endoscopy;  Laterality: N/A;  2:15  . CRYOABLATION N/A 04/07/2014   Procedure: SALVAGE CRYO ABLATION PROSTATE;  Surgeon: Su Grand, MD;  [facility];  Service: Urology;  Laterality: N/A;  . DIRECT LARYNGOSCOPY N/A 09/06/2018   Procedure: DIRECT LARYNGOSCOPY biopsy with frozen section;  Surgeon: Serena Colonel, MD;  Location: [facility] OR;  Service: ENT;  Laterality: N/A;  . IR GASTROSTOMY TUBE MOD SED  08/20/2018  . IR GENERIC HISTORICAL  03/27/2017   IR REMOVAL TUN ACCESS W/ PORT W/O FL MOD SED [facility]  . LARYNGETOMY N/A 09/06/2018  Procedure: total LARYNGECTOMY;  Surgeon: Serena Colonel, MD;  Location[facility] OR;  Service: ENT;  Laterality: N/A;  . LARYNGOSCOPY    . LYMPH NODE BIOPSY    . RADICAL NECK DISSECTION Right 09/06/2018   Procedure: Modified NECK DISSECTION;  Surgeon: Serena Colonel, MD;  Location: [facility] OR;  Service: ENT;  Laterality: Right;    Social History   Socioeconomic History  . Marital status: Married    Spouse name: Not on file  . Number of children: Not on file  . Years of education: Not on file  . Highest education level: Not on file  Occupational History  . Occupation: disabled  Social Needs  . Financial resource strain: Not very hard  . Food insecurity:    Worry: Never true    Inability: Never true  . Transportation needs:    Medical: No      Non-medical: No  Tobacco Use  . Smoking status: Former Smoker    Packs/day: 1.00    Years: 40.00    Pack years: 40.00    Types: Cigarettes    Last attempt to quit: 09/29/2013    Years since quitting: 5.2  . Smokeless tobacco: Never Used  Substance and Sexual Activity  . Alcohol use: No  . Drug use: No  . Sexual activity: Not on file  Lifestyle  . Physical activity:    Days per week: 3 days    Minutes per session: 30 min  . Stress: Only a little  Relationships  . Social connections:    Talks on phone: Never    Gets together: More than three times a week    Attends religious service: More than 4 times per year    Active member of club or organization: Yes    Attends meetings of clubs or organizations: More than 4 times per year    Relationship status: Married  . Intimate partner violence:    Fear of current or ex partner: No    Emotionally abused: No    Physically abused: No    Forced sexual activity: No  Other Topics Concern  . Not on file  Social History Narrative   Lives with wife in an apartment. He has stepchildren. They walk most everywhere they go locally but have access to transportation if needed.     No Known Allergies   Outpatient Medications Prior to Visit  Medication Sig Dispense Refill  . hydrochlorothiazide (HYDRODIURIL) 12.5 MG tablet Take 1 tablet (12.5 mg total) by mouth daily. 90 tablet 3  . levothyroxine (SYNTHROID, LEVOTHROID) 100 MCG tablet Place 1 tablet (100 mcg total) into feeding tube daily before breakfast. 90 tablet 3  . phenol (CHLORASEPTIC) 1.4 % LIQD Use as directed 1 spray in the mouth or throat as needed for throat irritation / pain.    . polyethylene glycol (MIRALAX / GLYCOLAX) packet Place 17 g into feeding tube daily.     . pravastatin (PRAVACHOL) 40 MG tablet Take 1 tablet (40 mg total) by mouth daily. 90 tablet 3  . Vitamin D, Ergocalciferol, (DRISDOL) 1.25 MG (50000 UT) CAPS capsule TAKE 1 CAPSULE EVERY 7 DAYS. 12 capsule 0  .  levothyroxine (SYNTHROID, LEVOTHROID) 100 MCG tablet TAKE 1 TABLET EVERY DAY (Patient not taking: Reported on 01/11/2019) 90 tablet 0   No facility-administered medications prior to visit.     Review of Systems  Constitutional: Positive for malaise/fatigue and weight loss. Negative for chills and fever.  HENT: Negative for hearing loss and tinnitus.  Eyes: Negative for blurred vision and double vision.  Respiratory: Positive for cough and sputum production. Negative for hemoptysis, shortness of breath and wheezing.   Cardiovascular: Negative for chest pain, palpitations, orthopnea, leg swelling and PND.  Gastrointestinal: Negative for abdominal pain, constipation, diarrhea, heartburn, nausea and vomiting.  Genitourinary: Negative for dysuria, hematuria and urgency.  Musculoskeletal: Negative for joint pain and myalgias.  Skin: Negative for itching and rash.  Neurological: Negative for dizziness, tingling, weakness and headaches.  Endo/Heme/Allergies: Negative for environmental allergies. Does not bruise/bleed easily.  Psychiatric/Behavioral: Positive for depression. The patient is not nervous/anxious and does not have insomnia.   All other systems reviewed and are negative.    Objective:  Physical Exam Vitals signs reviewed.  Constitutional:      General: He is not in acute distress.    Appearance: He is well-developed.  HENT:     Head: Normocephalic and atraumatic.  Eyes:     Comments: Right cornea significant clouding  Neck:     Vascular: No JVD.     Trachea: No tracheal deviation.     Comments: Palpable adenopathy in the right neck, status post laryngectomy, open stoma, significant deformity of the right neck. Cardiovascular:     Rate and Rhythm: Normal rate and regular rhythm.     Heart sounds: Normal heart sounds. No murmur.  Pulmonary:     Effort: Pulmonary effort is normal. No tachypnea, accessory muscle usage or respiratory distress.     Breath sounds: No stridor. No  wheezing, rhonchi or rales.     Comments: Diminished distant breath sounds bilaterally Abdominal:     General: Bowel sounds are normal. There is no distension.     Palpations: Abdomen is soft.     Tenderness: There is no abdominal tenderness.  Musculoskeletal:        General: No tenderness.  Skin:    General: Skin is warm and dry.     Capillary Refill: Capillary refill takes less than 2 seconds.     Findings: No rash.  Neurological:     Mental Status: He is alert and oriented to person, place, and time.  Psychiatric:        Behavior: Behavior normal.      Vitals:   01/11/19 0940  BP: 128/82  Pulse: 77  SpO2: 97%  Weight: 117 lb (53.1 kg)  Height: 6' (1.829 m)   97% on RA BMI Readings from Last 3 Encounters:  01/11/19 15.87 kg/m  12/07/18 18.46 kg/m  11/11/18 18.19 kg/m   Wt Readings from Last 3 Encounters:  01/11/19 117 lb (53.1 kg)  12/07/18 125 lb (56.7 kg)  11/11/18 123 lb 3.2 oz (55.9 kg)     CBC    Component Value Date/Time   WBC 2.8 (L) 11/11/2018 1147   WBC 7.3 09/08/2018 0604   RBC 6.05 (H) 11/11/2018 1147   RBC 3.95 (L) 09/08/2018 0604   HGB 14.8 11/11/2018 1147   HCT 47.0 11/11/2018 1147   PLT 180 11/11/2018 1147   MCV 78 (L) 11/11/2018 1147   MCH 24.5 (L) 11/11/2018 1147   MCH 24.8 (L) 09/08/2018 0604   MCHC 31.5 11/11/2018 1147   MCHC 30.5 09/08/2018 0604   RDW 17.8 (H) 11/11/2018 1147   LYMPHSABS 1.1 11/11/2018 1147   MONOABS 0.3 03/10/2018 1257   EOSABS 0.0 11/11/2018 1147   BASOSABS 0.0 11/11/2018 1147     Chest Imaging: CT soft tissue head neck: Does have a small right upper lobe nodule concerning for  a primary bronchogenic carcinoma due to the patient's history however it is very small in size and could very well be related to confluence of scar tissue surrounding significant amount of upper lobe emphysema. The patient's images have been independently reviewed by me.    Pulmonary Functions Testing Results: No flowsheet data  found.  FeNO: None   Pathology: None   Echocardiogram: None   Heart Catheterization: None     Assessment & Plan:   Laryngeal cancer (HCC) - Plan: Ambulatory referral to Hospice  Head and neck cancer (HCC)  Lung nodule  Cachexia (HCC)  DNR (do not resuscitate)  Protein-calorie malnutrition, severe  Squamous cell carcinoma of head and neck (HCC)  Pulmonary emphysema, unspecified emphysema type (HCC)  Discussion:  This is a 63 year old gentleman chronically ill-appearing recent diagnosis of head neck cancer.  Patient had recurrence of disease following history of laryngectomy by Dr. Pollyann Kennedy.  He was last seen by Dr. Pollyann Kennedy a few weeks ago in office which stated the patient would be nonsurgical management of his head neck cancer.  And would likely recommend hospice and palliative care.  I had a long discussion with the patient's family as well as the patient in the office today regarding how would like to proceed with the newly found left upper lobe lung nodule.  They understand that he has very poor prognosis regarding the disease within his oropharyngeal mass.  We discussed risk benefits and alternatives of proceeding with additional imaging versus proceeding with hospice.  We also had a goals of care discussion regarding DNR status.  At this time patient has agreed to become a durable DNR.  And would like to pursue hospice care.  I have placed referrals to hospice outpatient care.  Greater than 50% of this patient's 60-minute office visit was been face-to-face discussing the recommendations regarding transition to hospice care as well as CODE STATUS becoming a durable DNR.   Current Outpatient Medications:  .  hydrochlorothiazide (HYDRODIURIL) 12.5 MG tablet, Take 1 tablet (12.5 mg total) by mouth daily., Disp: 90 tablet, Rfl: 3 .  levothyroxine (SYNTHROID, LEVOTHROID) 100 MCG tablet, Place 1 tablet (100 mcg total) into feeding tube daily before breakfast., Disp: 90 tablet,  Rfl: 3 .  phenol (CHLORASEPTIC) 1.4 % LIQD, Use as directed 1 spray in the mouth or throat as needed for throat irritation / pain., Disp: , Rfl:  .  polyethylene glycol (MIRALAX / GLYCOLAX) packet, Place 17 g into feeding tube daily. , Disp: , Rfl:  .  pravastatin (PRAVACHOL) 40 MG tablet, Take 1 tablet (40 mg total) by mouth daily., Disp: 90 tablet, Rfl: 3 .  Vitamin D, Ergocalciferol, (DRISDOL) 1.25 MG (50000 UT) CAPS capsule, TAKE 1 CAPSULE EVERY 7 DAYS., Disp: 12 capsule, Rfl: 0   Josephine Igo, DO [facility] 01/11/2019 10:07 AM

## 2019-01-11 NOTE — Patient Instructions (Addendum)
Thank you for visiting Dr. Icard at  Pulmonary. Today we recommend the following: Orders Placed This Encounter  Procedures  . Ambulatory referral to Hospice     Return if symptoms worsen or fail to improve. 

## 2019-01-12 ENCOUNTER — Telehealth: Payer: Self-pay | Admitting: Pulmonary Disease

## 2019-01-12 NOTE — Telephone Encounter (Signed)
Called and spoke with Butch Penny, Hospice.  She wanted to go over OV from [facility], 01/11/19. Read over OV note and diagnosis.  She stated that she was going to contact Dr. Constance Holster about PET scan.  She stated that she would contact [facility] if she had further questions.  Nothing further at this time.

## 2019-01-14 ENCOUNTER — Telehealth (HOSPITAL_COMMUNITY): Payer: Self-pay | Admitting: *Deleted

## 2019-01-14 ENCOUNTER — Telehealth: Payer: Self-pay | Admitting: *Deleted

## 2019-01-14 ENCOUNTER — Encounter (HOSPITAL_COMMUNITY): Payer: Self-pay

## 2019-01-14 ENCOUNTER — Encounter (HOSPITAL_COMMUNITY): Payer: Medicare HMO

## 2019-01-14 NOTE — Telephone Encounter (Addendum)
Oncology Nurse Navigator Documentation  In follow-up to H&N TB recommendation to evaluate option for systemic tmt for recurrent disease and my call requesting appt, rec'd VMM patient has appt w/ Dr. Tera Helper [facility] 1/20 9:15.  I spoke with pt's caregiver/niece Brayton Layman, informed her of above, encouraged he to call [facility] to confirm appt, provided phone #.  Drs. Isidore Moos and ENT Constance Holster updated.  Gayleen Orem, RN, BSN Head & Neck Oncology Nurse [place] at [place] [phone]

## 2019-01-14 NOTE — Telephone Encounter (Signed)
The pulmonologist told me that he referred him to hospice care.

## 2019-01-14 NOTE — Telephone Encounter (Signed)
Oncology Nurse Navigator Documentation  LVMM for Brandon Hall, Brandon Hall, noting I received VMM from ENT Rosen's office confirming his request for pt to see MedOnc and to complete PET scan he ordered.  Gayleen Orem, RN, BSN Head & Neck Oncology Nurse [place] at [place] [phone]

## 2019-01-14 NOTE — Telephone Encounter (Signed)
Patient is going to keep his appointment on Monday 01/17/19 with Dr. Raliegh Ip. Tumor board suggested that since patient had such a good response to chemo prior that he could potentially benefit from chemo again. Patient wants to come to appt so that all information is presented to him. Patient is currently under Hospice services - not recommended by tumor board per Gayleen Orem Navigator. Patient will make final decision as to undergo chemo or stay with Hospice after his visit with Dr. Raliegh Ip on Monday.

## 2019-01-17 ENCOUNTER — Telehealth: Payer: Self-pay | Admitting: Pulmonary Disease

## 2019-01-17 ENCOUNTER — Encounter (HOSPITAL_COMMUNITY): Payer: Self-pay | Admitting: Hematology

## 2019-01-17 ENCOUNTER — Inpatient Hospital Stay (HOSPITAL_COMMUNITY): Attending: Hematology | Admitting: Hematology

## 2019-01-17 ENCOUNTER — Encounter (HOSPITAL_COMMUNITY): Payer: Self-pay | Admitting: *Deleted

## 2019-01-17 ENCOUNTER — Other Ambulatory Visit (HOSPITAL_COMMUNITY)
Admission: RE | Admit: 2019-01-17 | Discharge: 2019-01-17 | Disposition: A | Discharge status: Home / Self Care | Payer: Medicare Other | Source: Ambulatory Visit | Attending: Otolaryngology | Admitting: Otolaryngology

## 2019-01-17 ENCOUNTER — Inpatient Hospital Stay (HOSPITAL_COMMUNITY)

## 2019-01-17 ENCOUNTER — Other Ambulatory Visit: Payer: Self-pay

## 2019-01-17 VITALS — BP 142/80 | HR 78 | Temp 98.6°F | Resp 14 | Wt 115.7 lb

## 2019-01-17 DIAGNOSIS — C760 Malignant neoplasm of head, face and neck: Secondary | ICD-10-CM | POA: Diagnosis not present

## 2019-01-17 DIAGNOSIS — Z9221 Personal history of antineoplastic chemotherapy: Secondary | ICD-10-CM | POA: Diagnosis not present

## 2019-01-17 DIAGNOSIS — R918 Other nonspecific abnormal finding of lung field: Secondary | ICD-10-CM | POA: Insufficient documentation

## 2019-01-17 DIAGNOSIS — I10 Essential (primary) hypertension: Secondary | ICD-10-CM | POA: Insufficient documentation

## 2019-01-17 DIAGNOSIS — M129 Arthropathy, unspecified: Secondary | ICD-10-CM | POA: Diagnosis not present

## 2019-01-17 DIAGNOSIS — C329 Malignant neoplasm of larynx, unspecified: Secondary | ICD-10-CM | POA: Insufficient documentation

## 2019-01-17 DIAGNOSIS — Z923 Personal history of irradiation: Secondary | ICD-10-CM | POA: Diagnosis not present

## 2019-01-17 DIAGNOSIS — Z87891 Personal history of nicotine dependence: Secondary | ICD-10-CM | POA: Insufficient documentation

## 2019-01-17 DIAGNOSIS — Z85818 Personal history of malignant neoplasm of other sites of lip, oral cavity, and pharynx: Secondary | ICD-10-CM | POA: Diagnosis not present

## 2019-01-17 DIAGNOSIS — Z79899 Other long term (current) drug therapy: Secondary | ICD-10-CM | POA: Insufficient documentation

## 2019-01-17 NOTE — Progress Notes (Signed)
Oncology Navigator: I met with patient during office visit. He lives at home with his wife and has assistance from their niece with activities at home.  Patient wants to pursue treatment if he is a candidate.  I provided my contact information with patient and family and advised them on my role in his care here at [facility].  I provided written education on Keytruda, treatment option given to them, and advised of basic immunotherapy side effects.  I advised them to read the education material and call with any questions that they might have.  They verbalized understanding and patient nodded his head yes in agreement.

## 2019-01-17 NOTE — Telephone Encounter (Signed)
Sheyanna with Hospice returning call, CB is [phone].

## 2019-01-17 NOTE — Assessment & Plan Note (Signed)
1.  Recurrent squamous cell carcinoma of the head and neck: -Patient with 40-pack-year smoking history, quit in 2015. 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 - PET CT scan on 08/06/2018 shows large recurrent hypermetabolic right supraglottic mass with single hypermetabolic level 2 lymph node on the right. - On 09/06/2018, he underwent total laryngectomy and right neck dissection of levels 2, 3, and 4. - We discussed the pathology which showed invasive squamous cell carcinoma, poorly differentiated, spanning 3.9 cm, carcinoma involves skeletal muscle, thyroid cartilage and cricoid cartilage.  Positive lymphovascular invasion and perineural invasion.  No evidence of carcinoma in 4 out of 4 lymph nodes. - He was reportedly placed in hospice by his pulmonologist 2 to 3 days ago. - We discussed the results of the CT soft tissue neck on 12/23/2018 showing large enhancing mass in the region of the right tonsil, new and measures 28 x 24 mm.  Ring-enhancing fluid collection in the anterior neck below the base of the tongue is also new.  This could represent recurrent tumor or postop fluid.  Enlarging enhancing right level 2B lymph node measuring 14 x 21 mm compatible with tumor growth.  8 mm necrotic lymph node in the left anterior mid neck compatible with tumor.  15 mm necrotic node in the left lower neck compatible with tumor.  12 mm spiculated mass in the left apex new compared to prior studies.  4 mm and 5 mm left upper lobe nodules are also new. - Patient has fairly good performance status.  He did not experience any major side effects with prior chemotherapy in 2015.  He walks around at home and does all his ADLs and IADLs.  He also goes out shopping. - His appetite has been good.  No significant weight loss.  He has not been using PEG tube since  October 2019.  He is able to eat all sorts of foods. - Based on his performance status, I believe he is a candidate for single agent pembrolizumab.  I have discussed the results of [id number] study which showed improved overall survival with CPS more than 20 and in those with CPS more than 1.  It is associated with lower objective response rates of around 20% but longer duration of responses.  Fewer side effects were seen. - Patient is agreeable to this option.  We will send PDL 1 status today. -I have also recommended doing a whole-body PET CT scan for restaging. - We will plan to see him back after the PET CT scan to discuss results.

## 2019-01-17 NOTE — Telephone Encounter (Signed)
LMTCB x1 for [facility] with Hospice.

## 2019-01-17 NOTE — Patient Instructions (Addendum)
[place] at [facility]  Discharge Instructions: Please have your PET scan and follow up after that.   You saw Dr. Delton Coombes today. _______________________________________________________________  Thank you for choosing [place] at [facility] to provide your oncology and hematology care.  To afford each patient quality time with our providers, please arrive at least 15 minutes before your scheduled appointment.  You need to re-schedule your appointment if you arrive 10 or more minutes late.  We strive to give you quality time with our providers, and arriving late affects you and other patients whose appointments are after yours.  Also, if you no show three or more times for appointments you may be dismissed from the clinic.  Again, thank you for choosing [place] at [place] hope is that these requests will allow you access to exceptional care and in a timely manner. _______________________________________________________________  If you have questions after your visit, please contact our office at [phone] between the hours of 8:30 a.m. and 5:00 p.m. Voicemails left after 4:30 p.m. will not be returned until the following business day. _______________________________________________________________  For prescription refill requests, have your pharmacy contact our office. _______________________________________________________________  Recommendations made by the consultant and any test results will be sent to your referring physician. _______________________________________________________________

## 2019-01-17 NOTE — Progress Notes (Signed)
I spoke with Brandon Hall in pathology and ordered PD-L1 on [phone].

## 2019-01-17 NOTE — Patient Instructions (Signed)
[place] will see the doctor regularly throughout treatment.  We monitor your lab work prior to every treatment. The doctor monitors your response to treatment by the way you are feeling, your blood work, and scans periodically.  There will be wait times while you are here for treatment.  It will take about 30 minutes to 1 hour for your lab work to result.  Then there will be wait times while pharmacy mixes your medications.

## 2019-01-17 NOTE — Progress Notes (Signed)
[place], [place] [address]   CLINIC:  Medical Oncology/Hematology  PCP:  Dettinger, Fransisca Kaufmann, MD [place] [address] [phone]   REASON FOR VISIT: Follow-up for INVASIVE SQUAMOUS CELL CARCINOMA of the larynx, POORLY DIFFERENTIATED  CURRENT THERAPY: Total laryngectomy on 09/06/2018 with right neck dissection.  BRIEF ONCOLOGIC HISTORY:    Squamous cell carcinoma of head and neck (HCC)   05/05/2014 Imaging    CT scan of the neck show enlarged left lymph node measure less than 6 cm.    05/25/2014 Pathology Results    [phone] is positive for squamous cell carcinoma.    05/25/2014 Procedure    Lymph node biopsy of the left neck came back squamous cell carcinoma.    06/20/2014 Imaging    PET CT scan show no evidence of distant metastatic disease.    06/29/2014 Surgery    Laryngoscopy and random biopsy of the nasopharynx was negative for malignancy.    07/27/2014 - 09/27/2014 Chemotherapy    Cisplatin + 5FU (days 1-5) every 28 days 4 cycles    11/06/2014 - 12/25/2014 Radiation Therapy    In [place] with Dr. Isidore Moos    04/25/2015 Imaging    PET - Restaging.  No evidence of hypermetabolic recurrent or metastatic disease.    05/17/2015 Pathology Results    [id number]:  p16 (HPV) negative.      CANCER STAGING: Cancer Staging Squamous cell carcinoma of head and neck (HCC) Staging form: Pharynx - Nasopharynx, AJCC 7th Edition - Clinical: Stage IVB (TX, N3a, M0) - Signed by Eppie Gibson, MD on 07/13/2014    INTERVAL HISTORY:  [name] 63 y.o. male returns for routine follow-up for invasive squamous cell carcinoma of the larynx. He is here today with his family. He follows commands well but is non verbal due to his laryngectomy. He does report mild trouble swallowing but he still eats whatever foods he wants. Denies any nausea, vomiting, or diarrhea. Denies any new pains. Had not noticed any recent bleeding such as epistaxis,  hematuria or hematochezia. Denies recent chest pain on exertion, shortness of breath on minimal exertion, pre-syncopal episodes, or palpitations. Denies any numbness or tingling in hands or feet. Denies any recent fevers, infections, or recent hospitalizations. Patient reports appetite at 50% and energy level at 25%.  He worked in [employer] most of his life. He is full functional at home and performs all his own ADLs. He no longer drives but is able to feed and bathe himself and walk without assistance.    REVIEW OF SYSTEMS:  Review of Systems  HENT:   Positive for trouble swallowing.   Gastrointestinal: Positive for constipation.  Psychiatric/Behavioral: Positive for sleep disturbance.  All other systems reviewed and are negative.    PAST MEDICAL/SURGICAL HISTORY:  Past Medical History:  Diagnosis Date  . Arthritis   . Blindness of right eye   . Cancer ([place])    pharyngeal ca  . History of radiation therapy 11/2014   TxN3MO squamous cell carcinoma of L neck, Stage IVB, unknown primary 70 Gy in 35 fractions treated at [facility], finished 12/25/14  . History of shingles    RASH  02-03-2014  PER PCP RESOLVING  . Hypertension    NO MEDS ,  MONITORED BY PCP  . Hypothyroidism, postradioiodine therapy   . Prostate cancer ([place]) 07/12/2014   DX  SEVERAL  YRS   AGO  WITH RADIATION THERAPY  . Squamous cell carcinoma  of head and neck ([place]) 07/12/2014  . 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 COLONOSCOPY  10/20/2011   Procedure: COLONOSCOPY;  Surgeon: Daneil Dolin, MD;  [facility];  Service: Endoscopy;  Laterality: N/A;  1:00 pm  . COLONOSCOPY N/A 02/03/2018   Procedure: COLONOSCOPY;  Surgeon: Daneil Dolin, MD;  [facility];  Service: Endoscopy;  Laterality: N/A;  2:15  . CRYOABLATION N/A 04/07/2014   Procedure: SALVAGE CRYO ABLATION PROSTATE;  Surgeon: Lowella Bandy, MD;  Location: [facility];  Service: Urology;  Laterality: N/A;  . DIRECT LARYNGOSCOPY N/A 09/06/2018   Procedure: DIRECT LARYNGOSCOPY biopsy with frozen section;  Surgeon: Izora Gala, MD;  Location: [place];  Service: ENT;  Laterality: N/A;  . IR GASTROSTOMY TUBE MOD SED  08/20/2018  . IR GENERIC HISTORICAL  03/27/2017   IR REMOVAL TUN ACCESS W/ PORT W/O FL MOD SED [facility] RAD  . LARYNGETOMY N/A 09/06/2018   Procedure: total LARYNGECTOMY;  Surgeon: Izora Gala, MD;  Location: [place];  Service: ENT;  Laterality: N/A;  . LARYNGOSCOPY    . LYMPH NODE BIOPSY    . RADICAL NECK DISSECTION Right 09/06/2018   Procedure: Modified NECK DISSECTION;  Surgeon: Izora Gala, MD;  Location: [place];  Service: ENT;  Laterality: Right;     SOCIAL HISTORY:  Social History   Socioeconomic History  . Marital status: Married    Spouse name: Not on file  . Number of children: Not on file  . Years of education: Not on file  . Highest education level: Not on file  Occupational History  . Occupation: disabled  Social Needs  . Financial resource strain: Not very hard  . Food insecurity:    Worry: Never true    Inability: Never true  . Transportation needs:    Medical: No    Non-medical: No  Tobacco Use  . Smoking status: Former Smoker    Packs/day: 1.00    Years: 40.00    Pack years: 40.00    Types: Cigarettes    Last attempt to quit: 09/29/2013    Years since quitting: 5.3  . Smokeless tobacco: Never Used  Substance and Sexual Activity  . Alcohol use: No  . Drug use: No  . Sexual activity: Not on file  Lifestyle  . Physical activity:    Days per week: 3 days    Minutes per session: 30 min  . Stress: Only a little  Relationships  . Social connections:    Talks on phone: Never    Gets together: More than three times a week    Attends religious service: More than 4 times per year    Active member of club or organization: Yes    Attends meetings of clubs or organizations: More than 4 times per year     Relationship status: Married  . Intimate partner violence:    Fear of current or ex partner: No    Emotionally abused: No    Physically abused: No    Forced sexual activity: No  Other Topics Concern  . Not on file  Social History Narrative   Lives with wife in an apartment. He has stepchildren. They walk most everywhere they go locally but have access to transportation if needed.    FAMILY HISTORY:  Family History  Problem Relation Age of Onset  . Colon cancer Father   . Hypertension Mother   .  Colon cancer Brother   . Colon cancer Brother     CURRENT MEDICATIONS:  Outpatient Encounter Medications as of 01/17/2019  Medication Sig Note  . HYDROcodone-acetaminophen (HYCET) 7.5-325 mg/15 ml solution Take 2 teaspoon every 4 hours as needed   . hydrochlorothiazide (HYDRODIURIL) 12.5 MG tablet Take 1 tablet (12.5 mg total) by mouth daily.   Marland Kitchen levothyroxine (SYNTHROID, LEVOTHROID) 100 MCG tablet Place 1 tablet (100 mcg total) into feeding tube daily before breakfast. (Patient taking differently: Take 100 mcg by mouth daily before breakfast. )   . phenol (CHLORASEPTIC) 1.4 % LIQD Use as directed 1 spray in the mouth or throat as needed for throat irritation / pain.   . polyethylene glycol (MIRALAX / GLYCOLAX) packet Take 17 g by mouth daily.  09/21/2018: Takes as needed.  . pravastatin (PRAVACHOL) 40 MG tablet Take 1 tablet (40 mg total) by mouth daily.   . Vitamin D, Ergocalciferol, (DRISDOL) 1.25 MG (50000 UT) CAPS capsule TAKE 1 CAPSULE EVERY 7 DAYS.    No facility-administered encounter medications on file as of 01/17/2019.     ALLERGIES:  No Known Allergies   PHYSICAL EXAM:  ECOG Performance status: 1  Vitals:   01/17/19 0944  BP: (!) 142/80  Pulse: 78  Resp: 14  Temp: 98.6 F (37 C)  SpO2: 100%   Filed Weights   01/17/19 0944  Weight: 115 lb 11.2 oz (52.5 kg)    Physical Exam Constitutional:      Appearance: Normal appearance. He is normal weight.  Neck:      Musculoskeletal: Normal range of motion and neck supple.  Cardiovascular:     Rate and Rhythm: Normal rate and regular rhythm.     Heart sounds: Normal heart sounds.  Pulmonary:     Effort: Pulmonary effort is normal.     Breath sounds: Normal breath sounds.  Abdominal:     General: Abdomen is flat.     Palpations: Abdomen is soft.  Musculoskeletal: Normal range of motion.  Skin:    General: Skin is warm and dry.  Neurological:     Mental Status: He is alert and oriented to person, place, and time. Mental status is at baseline.  Psychiatric:        Mood and Affect: Mood normal.        Behavior: Behavior normal.        Thought Content: Thought content normal.        Judgment: Judgment normal.   Tracheotomy site is normal.  PEG site is also within normal limits.  No palpable adenopathy in the axilla or inguinal region.  Vague palpable adenopathy in the neck region. Could not examine tonsils because of trismus.  Anterior part of tongue has no visible masses.   LABORATORY DATA:  I have reviewed the labs as listed.  CBC    Component Value Date/Time   WBC 2.8 (L) 11/11/2018 1147   WBC 7.3 09/08/2018 0604   RBC 6.05 (H) 11/11/2018 1147   RBC 3.95 (L) 09/08/2018 0604   HGB 14.8 11/11/2018 1147   HCT 47.0 11/11/2018 1147   PLT 180 11/11/2018 1147   MCV 78 (L) 11/11/2018 1147   MCH 24.5 (L) 11/11/2018 1147   MCH 24.8 (L) 09/08/2018 0604   MCHC 31.5 11/11/2018 1147   MCHC 30.5 09/08/2018 0604   RDW 17.8 (H) 11/11/2018 1147   LYMPHSABS 1.1 11/11/2018 1147   MONOABS 0.3 03/10/2018 1257   EOSABS 0.0 11/11/2018 1147   BASOSABS 0.0 11/11/2018 1147  CMP Latest Ref Rng & Units 11/11/2018 09/14/2018 09/08/2018  Glucose 65 - 99 mg/dL 80 91 179(H)  BUN 8 - 27 mg/dL 14 13 10   Creatinine 0.76 - 1.27 mg/dL 0.87 0.77 0.76  Sodium 134 - 144 mmol/L 142 134 136  Potassium 3.5 - 5.2 mmol/L 4.1 4.6 3.6  Chloride 96 - 106 mmol/L 99 90(L) 101  CO2 20 - 29 mmol/L 24 26 26   Calcium 8.6 - 10.2  mg/dL 9.7 9.4 7.7(L)  Total Protein 6.0 - 8.5 g/dL 7.4 7.0 -  Total Bilirubin 0.0 - 1.2 mg/dL 0.3 0.3 -  Alkaline Phos 39 - 117 IU/L 83 77 -  AST 0 - 40 IU/L 20 24 -  ALT 0 - 44 IU/L 15 33 -       DIAGNOSTIC IMAGING:  I have independently reviewed the scans and discussed with the patient.   I have reviewed Francene Finders, NP's note and agree with the documentation.  I personally performed a face-to-face visit, made revisions and my assessment and plan is as follows.    ASSESSMENT & PLAN:   Squamous cell carcinoma of head and neck 1.  Recurrent squamous cell carcinoma of the head and neck: -Patient with 40-pack-year smoking history, quit in 2015. 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 - PET CT scan on 08/06/2018 shows large recurrent hypermetabolic right supraglottic mass with single hypermetabolic level 2 lymph node on the right. - On 09/06/2018, he underwent total laryngectomy and right neck dissection of levels 2, 3, and 4. - We discussed the pathology which showed invasive squamous cell carcinoma, poorly differentiated, spanning 3.9 cm, carcinoma involves skeletal muscle, thyroid cartilage and cricoid cartilage.  Positive lymphovascular invasion and perineural invasion.  No evidence of carcinoma in 4 out of 4 lymph nodes. - He was reportedly placed in hospice by his pulmonologist 2 to 3 days ago. - We discussed the results of the CT soft tissue neck on 12/23/2018 showing large enhancing mass in the region of the right tonsil, new and measures 28 x 24 mm.  Ring-enhancing fluid collection in the anterior neck below the base of the tongue is also new.  This could represent recurrent tumor or postop fluid.  Enlarging enhancing right level 2B lymph node measuring 14 x 21 mm compatible with tumor growth.  8 mm necrotic lymph node in  the left anterior mid neck compatible with tumor.  15 mm necrotic node in the left lower neck compatible with tumor.  12 mm spiculated mass in the left apex new compared to prior studies.  4 mm and 5 mm left upper lobe nodules are also new. - Patient has fairly good performance status.  He did not experience any major side effects with prior chemotherapy in 2015.  He walks around at home and does all his ADLs and IADLs.  He also goes out shopping. - His appetite has been good.  No significant weight loss.  He has not been using PEG tube since October 2019.  He is able to eat all sorts of foods. - Based on his performance status, I believe he is a candidate for single agent pembrolizumab.  I have discussed the results of [id number] study which showed improved overall survival with CPS more than 20 and in those with CPS more than 1.  It is associated with lower objective response rates  of around 20% but longer duration of responses.  Fewer side effects were seen. - Patient is agreeable to this option.  We will send PDL 1 status today. -I have also recommended doing a whole-body PET CT scan for restaging. - We will plan to see him back after the PET CT scan to discuss results.   Total time spent is 40 minutes with more than 50% of the time spent face-to-face discussing scan results, pathology results, treatment options and coordination of care.  Orders placed this encounter:  Orders Placed This Encounter  Procedures  . NM PET Image Restag (PS) Skull Base To Thigh      Derek Jack, MD [place] [phone]

## 2019-01-17 NOTE — Telephone Encounter (Signed)
Called and spoke with Brandon Hall, [facility].  She was stating that they were requesting hydrocodone acetaminophen to be changed from tab to liquid.  She stated that the Patient may be going to revoke hospice.  He was seen at the cancer center today,and has a PET scan scheduled for 01/26/19.  Will route to Dr. Valeta Harms

## 2019-01-18 NOTE — Telephone Encounter (Signed)
Called Brandon Hall at number provided. Unable to reach. Left message for her to give [place] a call back. Due to voicemail being vague I left no patient information on it.

## 2019-01-18 NOTE — Telephone Encounter (Signed)
Spoke with [facility] and advised her of Dr. Valeta Harms message. She understood and will let the pt know. Nothing further is needed.

## 2019-01-18 NOTE — Telephone Encounter (Signed)
Called and left message for [facility] to call back.

## 2019-01-18 NOTE — Telephone Encounter (Signed)
PCCM:  If patient has chosen to revoke hospice care, all pain and symptom management will need to come from his primary provider or oncologist at this time.   Garner Nash, DO [facility] 01/18/2019 7:37 AM

## 2019-01-19 ENCOUNTER — Other Ambulatory Visit (HOSPITAL_COMMUNITY): Payer: Self-pay | Admitting: *Deleted

## 2019-01-19 MED ORDER — HYDROCODONE-ACETAMINOPHEN 7.5-325 MG/15ML PO SOLN: ORAL | 0 refills | Status: DC

## 2019-01-21 ENCOUNTER — Other Ambulatory Visit (HOSPITAL_COMMUNITY): Payer: Self-pay | Admitting: Nurse Practitioner

## 2019-01-21 ENCOUNTER — Other Ambulatory Visit: Payer: Self-pay | Admitting: Otolaryngology

## 2019-01-21 DIAGNOSIS — C760 Malignant neoplasm of head, face and neck: Secondary | ICD-10-CM

## 2019-01-24 ENCOUNTER — Other Ambulatory Visit (HOSPITAL_COMMUNITY): Payer: Self-pay | Admitting: Nurse Practitioner

## 2019-01-24 ENCOUNTER — Other Ambulatory Visit (HOSPITAL_COMMUNITY): Payer: Self-pay | Admitting: *Deleted

## 2019-01-26 ENCOUNTER — Encounter (HOSPITAL_COMMUNITY)
Admission: RE | Admit: 2019-01-26 | Discharge: 2019-01-26 | Disposition: A | Discharge status: Home / Self Care | Payer: Medicare Other | Source: Ambulatory Visit | Attending: Nurse Practitioner | Admitting: Nurse Practitioner

## 2019-01-26 DIAGNOSIS — C760 Malignant neoplasm of head, face and neck: Secondary | ICD-10-CM | POA: Diagnosis not present

## 2019-01-26 DIAGNOSIS — Z79899 Other long term (current) drug therapy: Secondary | ICD-10-CM | POA: Insufficient documentation

## 2019-01-26 LAB — GLUCOSE, CAPILLARY: Glucose-Capillary: 91 mg/dL (ref 70–99)

## 2019-01-26 MED ORDER — FLUDEOXYGLUCOSE F - 18 (FDG) INJECTION
6.3200 | Freq: Once | INTRAVENOUS | Status: AC | PRN
  Administered 2019-01-26: 6.32 via INTRAVENOUS

## 2019-01-27 ENCOUNTER — Other Ambulatory Visit (HOSPITAL_COMMUNITY): Payer: Self-pay | Admitting: Nurse Practitioner

## 2019-01-28 ENCOUNTER — Other Ambulatory Visit (HOSPITAL_COMMUNITY): Payer: Self-pay | Admitting: Hematology

## 2019-01-28 ENCOUNTER — Ambulatory Visit (HOSPITAL_COMMUNITY): Payer: Medicare HMO | Admitting: Hematology

## 2019-01-28 NOTE — Progress Notes (Deleted)
Brandon Hall, [place] [address]   CLINIC:  Medical Oncology/Hematology  PCP:  Dettinger, Fransisca Kaufmann, MD [place] [address] [phone]   REASON FOR VISIT: Follow-up for INVASIVE SQUAMOUS CELL CARCINOMA of the larynx, POORLY DIFFERENTIATED  CURRENT THERAPY: Total laryngectomy on 09/06/2018 with right neck dissection.  BRIEF ONCOLOGIC HISTORY:    Squamous cell carcinoma of head and neck (HCC)   05/05/2014 Imaging    CT scan of the neck show enlarged left lymph node measure less than 6 cm.    05/25/2014 Pathology Results    [phone] is positive for squamous cell carcinoma.    05/25/2014 Procedure    Lymph node biopsy of the left neck came back squamous cell carcinoma.    06/20/2014 Imaging    PET CT scan show no evidence of distant metastatic disease.    06/29/2014 Surgery    Laryngoscopy and random biopsy of the nasopharynx was negative for malignancy.    07/27/2014 - 09/27/2014 Chemotherapy    Cisplatin + 5FU (days 1-5) every 28 days 4 cycles    11/06/2014 - 12/25/2014 Radiation Therapy    In [place] with Dr. Isidore Moos    04/25/2015 Imaging    PET - Restaging.  No evidence of hypermetabolic recurrent or metastatic disease.    05/17/2015 Pathology Results    [id number]:  p16 (HPV) negative.      CANCER STAGING: Cancer Staging Squamous cell carcinoma of head and neck (HCC) Staging form: Pharynx - Nasopharynx, AJCC 7th Edition - Clinical: Stage IVB (TX, N3a, M0) - Signed by Eppie Gibson, MD on 07/13/2014    INTERVAL HISTORY:  Brandon Hall 63 y.o. male returns for routine follow-up for invasive squamous cell carcinoma of the larynx.     REVIEW OF SYSTEMS:  Review of Systems - Oncology   PAST MEDICAL/SURGICAL HISTORY:  Past Medical History:  Diagnosis Date  . Arthritis   . Blindness of right eye   . Cancer ([place])    pharyngeal ca  . History of radiation therapy 11/2014   TxN3MO squamous cell carcinoma of L neck, Stage  IVB, unknown primary 70 Gy in 35 fractions treated at [facility], finished 12/25/14  . History of shingles    RASH  02-03-2014  PER PCP RESOLVING  . Hypertension    NO MEDS ,  MONITORED BY PCP  . Hypothyroidism, postradioiodine therapy   . Prostate cancer ([place]) 07/12/2014   DX  SEVERAL  YRS   AGO  WITH RADIATION THERAPY  . Squamous cell carcinoma of head and neck ([place]) 07/12/2014  . 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 COLONOSCOPY  10/20/2011   Procedure: COLONOSCOPY;  Surgeon: Daneil Dolin, MD;  [facility];  Service: Endoscopy;  Laterality: N/A;  1:00 pm  . COLONOSCOPY N/A 02/03/2018   Procedure: COLONOSCOPY;  Surgeon: Daneil Dolin, MD;  [facility];  Service: Endoscopy;  Laterality: N/A;  2:15  . CRYOABLATION N/A 04/07/2014   Procedure: SALVAGE CRYO ABLATION PROSTATE;  Surgeon: Lowella Bandy, MD;  Location: [facility];  Service: Urology;  Laterality: N/A;  . DIRECT LARYNGOSCOPY N/A 09/06/2018   Procedure: DIRECT LARYNGOSCOPY biopsy with frozen section;  Surgeon: Izora Gala, MD;  Location: [place];  Service: ENT;  Laterality: N/A;  . IR GASTROSTOMY TUBE MOD SED  08/20/2018  . IR GENERIC HISTORICAL  03/27/2017   IR REMOVAL TUN  ACCESS W/ PORT W/O FL MOD SED [facility]  . LARYNGETOMY N/A 09/06/2018   Procedure: total LARYNGECTOMY;  Surgeon: Izora Gala, MD;  Location: [place] [place];  Service: ENT;  Laterality: N/A;  . LARYNGOSCOPY    . LYMPH NODE BIOPSY    . RADICAL NECK DISSECTION Right 09/06/2018   Procedure: Modified NECK DISSECTION;  Surgeon: Izora Gala, MD;  Location: [place];  Service: ENT;  Laterality: Right;     SOCIAL HISTORY:  Social History   Socioeconomic History  . Marital status: Married    Spouse name: Not on file  . Number of children: Not on file  . Years of education: Not on file  . Highest education level: Not on file  Occupational History  . Occupation:  disabled  Social Needs  . Financial resource strain: Not very hard  . Food insecurity:    Worry: Never true    Inability: Never true  . Transportation needs:    Medical: No    Non-medical: No  Tobacco Use  . Smoking status: Former Smoker    Packs/day: 1.00    Years: 40.00    Pack years: 40.00    Types: Cigarettes    Last attempt to quit: 09/29/2013    Years since quitting: 5.3  . Smokeless tobacco: Never Used  Substance and Sexual Activity  . Alcohol use: No  . Drug use: No  . Sexual activity: Not on file  Lifestyle  . Physical activity:    Days per week: 3 days    Minutes per session: 30 min  . Stress: Only a little  Relationships  . Social connections:    Talks on phone: Never    Gets together: More than three times a week    Attends religious service: More than 4 times per year    Active member of club or organization: Yes    Attends meetings of clubs or organizations: More than 4 times per year    Relationship status: Married  . Intimate partner violence:    Fear of current or ex partner: No    Emotionally abused: No    Physically abused: No    Forced sexual activity: No  Other Topics Concern  . Not on file  Social History Narrative   Lives with wife in an apartment. He has stepchildren. They walk most everywhere they go locally but have access to transportation if needed.    FAMILY HISTORY:  Family History  Problem Relation Age of Onset  . Colon cancer Father   . Hypertension Mother   . Colon cancer Brother   . Colon cancer Brother     CURRENT MEDICATIONS:  Outpatient Encounter Medications as of 01/28/2019  Medication Sig Note  . hydrochlorothiazide (HYDRODIURIL) 12.5 MG tablet Take 1 tablet (12.5 mg total) by mouth daily.   Marland Kitchen HYDROcodone-acetaminophen (HYCET) 7.5-325 mg/15 ml solution TAKE (2) TEASPOONFULS EVERY FOUR HOURS AS NEED- ED.   Marland Kitchen levothyroxine (SYNTHROID, LEVOTHROID) 100 MCG tablet Place 1 tablet (100 mcg total) into feeding tube daily before  breakfast. (Patient taking differently: Take 100 mcg by mouth daily before breakfast. )   . phenol (CHLORASEPTIC) 1.4 % LIQD Use as directed 1 spray in the mouth or throat as needed for throat irritation / pain.   . polyethylene glycol (MIRALAX / GLYCOLAX) packet Take 17 g by mouth daily.  09/21/2018: Takes as needed.  . pravastatin (PRAVACHOL) 40 MG tablet Take 1 tablet (40 mg total) by mouth daily.   . Vitamin D,  Ergocalciferol, (DRISDOL) 1.25 MG (50000 UT) CAPS capsule TAKE 1 CAPSULE EVERY 7 DAYS.    No facility-administered encounter medications on file as of 01/28/2019.     ALLERGIES:  No Known Allergies   PHYSICAL EXAM:  ECOG Performance status: 1  There were no vitals filed for this visit. There were no vitals filed for this visit.  Physical Exam   LABORATORY DATA:  I have reviewed the labs as listed.  CBC    Component Value Date/Time   WBC 2.8 (L) 11/11/2018 1147   WBC 7.3 09/08/2018 0604   RBC 6.05 (H) 11/11/2018 1147   RBC 3.95 (L) 09/08/2018 0604   HGB 14.8 11/11/2018 1147   HCT 47.0 11/11/2018 1147   PLT 180 11/11/2018 1147   MCV 78 (L) 11/11/2018 1147   MCH 24.5 (L) 11/11/2018 1147   MCH 24.8 (L) 09/08/2018 0604   MCHC 31.5 11/11/2018 1147   MCHC 30.5 09/08/2018 0604   RDW 17.8 (H) 11/11/2018 1147   LYMPHSABS 1.1 11/11/2018 1147   MONOABS 0.3 03/10/2018 1257   EOSABS 0.0 11/11/2018 1147   BASOSABS 0.0 11/11/2018 1147   CMP Latest Ref Rng & Units 11/11/2018 09/14/2018 09/08/2018  Glucose 65 - 99 mg/dL 80 91 179(H)  BUN 8 - 27 mg/dL 14 13 10   Creatinine 0.76 - 1.27 mg/dL 0.87 0.77 0.76  Sodium 134 - 144 mmol/L 142 134 136  Potassium 3.5 - 5.2 mmol/L 4.1 4.6 3.6  Chloride 96 - 106 mmol/L 99 90(L) 101  CO2 20 - 29 mmol/L 24 26 26   Calcium 8.6 - 10.2 mg/dL 9.7 9.4 7.7(L)  Total Protein 6.0 - 8.5 g/dL 7.4 7.0 -  Total Bilirubin 0.0 - 1.2 mg/dL 0.3 0.3 -  Alkaline Phos 39 - 117 IU/L 83 77 -  AST 0 - 40 IU/L 20 24 -  ALT 0 - 44 IU/L 15 33 -        DIAGNOSTIC IMAGING:  I have independently reviewed the scans and discussed with the patient.   I have reviewed Francene Finders, NP's note and agree with the documentation.  I personally performed a face-to-face visit, made revisions and my assessment and plan is as follows.    ASSESSMENT & PLAN:   No problem-specific Assessment & Plan notes found for this encounter.      Orders placed this encounter:  No orders of the defined types were placed in this encounter.     Derek Jack, MD [place] [phone]

## 2019-01-30 ENCOUNTER — Emergency Department (HOSPITAL_COMMUNITY)
Admission: EM | Admit: 2019-01-30 | Discharge: 2019-01-31 | Disposition: A | Discharge status: Home / Self Care | Payer: Medicare HMO | Attending: Emergency Medicine | Admitting: Emergency Medicine

## 2019-01-30 DIAGNOSIS — E162 Hypoglycemia, unspecified: Secondary | ICD-10-CM

## 2019-01-30 DIAGNOSIS — Z66 Do not resuscitate: Secondary | ICD-10-CM | POA: Diagnosis not present

## 2019-01-30 DIAGNOSIS — Z79899 Other long term (current) drug therapy: Secondary | ICD-10-CM | POA: Diagnosis not present

## 2019-01-30 DIAGNOSIS — Z87891 Personal history of nicotine dependence: Secondary | ICD-10-CM | POA: Diagnosis not present

## 2019-01-30 DIAGNOSIS — R627 Adult failure to thrive: Secondary | ICD-10-CM

## 2019-01-30 DIAGNOSIS — E639 Nutritional deficiency, unspecified: Secondary | ICD-10-CM | POA: Diagnosis not present

## 2019-01-30 DIAGNOSIS — E890 Postprocedural hypothyroidism: Secondary | ICD-10-CM | POA: Insufficient documentation

## 2019-01-30 DIAGNOSIS — Z85818 Personal history of malignant neoplasm of other sites of lip, oral cavity, and pharynx: Secondary | ICD-10-CM | POA: Diagnosis not present

## 2019-01-30 DIAGNOSIS — I10 Essential (primary) hypertension: Secondary | ICD-10-CM | POA: Insufficient documentation

## 2019-01-30 DIAGNOSIS — R55 Syncope and collapse: Secondary | ICD-10-CM | POA: Diagnosis not present

## 2019-01-30 DIAGNOSIS — E11649 Type 2 diabetes mellitus with hypoglycemia without coma: Secondary | ICD-10-CM | POA: Diagnosis not present

## 2019-01-30 DIAGNOSIS — E161 Other hypoglycemia: Secondary | ICD-10-CM | POA: Diagnosis not present

## 2019-01-30 DIAGNOSIS — R404 Transient alteration of awareness: Secondary | ICD-10-CM | POA: Diagnosis not present

## 2019-01-30 DIAGNOSIS — R52 Pain, unspecified: Secondary | ICD-10-CM | POA: Diagnosis not present

## 2019-01-30 NOTE — ED Triage Notes (Signed)
Per ems family reports that pt had two ? Seizures tonight with a syncopal episode, cbg 32 with ems, pt given 12.5 grams of dextrose with improvement of cbg to 211. Pt fell on right side, deformity noted to right clavicle/shoulder area. Cms intact distal, pt alert to baseline upon arrival to er,

## 2019-01-31 ENCOUNTER — Encounter (HOSPITAL_COMMUNITY): Payer: Self-pay | Admitting: *Deleted

## 2019-01-31 ENCOUNTER — Other Ambulatory Visit: Payer: Self-pay

## 2019-01-31 ENCOUNTER — Other Ambulatory Visit (HOSPITAL_COMMUNITY): Payer: Self-pay | Admitting: Hematology

## 2019-01-31 DIAGNOSIS — Z7189 Other specified counseling: Secondary | ICD-10-CM | POA: Insufficient documentation

## 2019-01-31 LAB — CBG MONITORING, ED
GLUCOSE-CAPILLARY: 141 mg/dL — AB (ref 70–99)
GLUCOSE-CAPILLARY: 171 mg/dL — AB (ref 70–99)
Glucose-Capillary: 133 mg/dL — ABNORMAL HIGH (ref 70–99)
Glucose-Capillary: 121 mg/dL — ABNORMAL HIGH (ref 70–99)
Glucose-Capillary: 88 mg/dL (ref 70–99)
Glucose-Capillary: 87 mg/dL (ref 70–99)

## 2019-01-31 LAB — CBC WITH DIFFERENTIAL/PLATELET
Abs Immature Granulocytes: 0.01 10*3/uL (ref 0.00–0.07)
Basophils Absolute: 0 10*3/uL (ref 0.0–0.1)
Basophils Relative: 0 %
Eosinophils Absolute: 0 10*3/uL (ref 0.0–0.5)
Eosinophils Relative: 0 %
HCT: 38.5 % — ABNORMAL LOW (ref 39.0–52.0)
Hemoglobin: 11.3 g/dL — ABNORMAL LOW (ref 13.0–17.0)
Immature Granulocytes: 0 %
Lymphocytes Relative: 9 %
Lymphs Abs: 0.7 10*3/uL (ref 0.7–4.0)
MCH: 24.4 pg — ABNORMAL LOW (ref 26.0–34.0)
MCHC: 29.4 g/dL — ABNORMAL LOW (ref 30.0–36.0)
MCV: 83.2 fL (ref 80.0–100.0)
MONO ABS: 0.5 10*3/uL (ref 0.1–1.0)
Monocytes Relative: 7 %
NEUTROS ABS: 5.8 10*3/uL (ref 1.7–7.7)
Neutrophils Relative %: 84 %
Platelets: 225 10*3/uL (ref 150–400)
RBC: 4.63 MIL/uL (ref 4.22–5.81)
RDW: 17.6 % — ABNORMAL HIGH (ref 11.5–15.5)
WBC: 7.1 10*3/uL (ref 4.0–10.5)
nRBC: 0 % (ref 0.0–0.2)

## 2019-01-31 LAB — MAGNESIUM: Magnesium: 2.4 mg/dL (ref 1.7–2.4)

## 2019-01-31 LAB — BASIC METABOLIC PANEL
Anion gap: 9 (ref 5–15)
BUN: 20 mg/dL (ref 8–23)
CO2: 27 mmol/L (ref 22–32)
Calcium: 9 mg/dL (ref 8.9–10.3)
Chloride: 101 mmol/L (ref 98–111)
Creatinine, Ser: 0.75 mg/dL (ref 0.61–1.24)
GFR calc Af Amer: >60 mL/min (ref >60)
GFR calc non Af Amer: >60 mL/min (ref >60)
Glucose, Bld: 161 mg/dL — ABNORMAL HIGH (ref 70–99)
POTASSIUM: 3.7 mmol/L (ref 3.5–5.1)
Sodium: 137 mmol/L (ref 135–145)

## 2019-01-31 LAB — PHOSPHORUS: Phosphorus: 3.1 mg/dL (ref 2.5–4.6)

## 2019-01-31 MED ORDER — SODIUM CHLORIDE 0.9 % IV SOLN
INTRAVENOUS | Status: DC
  Administered 2019-01-31: 03:00:00 via INTRAVENOUS

## 2019-01-31 MED ORDER — SODIUM CHLORIDE 0.9 % IV BOLUS
1000.0000 mL | Freq: Once | INTRAVENOUS | Status: AC
  Administered 2019-01-31: 1000 mL via INTRAVENOUS

## 2019-01-31 MED ORDER — ENSURE MAX PROTEIN PO LIQD
11.0000 [oz_av] | Freq: Every day | ORAL | Status: DC
  Administered 2019-01-31: 11 [oz_av] via ORAL
  Filled 2019-01-31: qty 330

## 2019-01-31 NOTE — Discharge Instructions (Addendum)
You are seen in the ER for unresponsive spell. We noted that your blood sugar was extremely low.  This all stems from the fact that you are not getting proper nutrition. Please ensure you are getting appropriate amount of caloric intake daily. Please discussed this visit with your cancer team or your primary care doctor so that they can ensure you are getting proper nutrition.

## 2019-01-31 NOTE — ED Notes (Signed)
Pt able to keep ensure down, no N/V

## 2019-01-31 NOTE — ED Provider Notes (Signed)
[facility] EMERGENCY DEPARTMENT Provider Note   CSN: [id number] Arrival date & time: 01/30/19  2349     History   Chief Complaint Chief Complaint  Patient presents with  . Loss of Consciousness    HPI Brandon Hall is a 63 y.o. male.  HPI  63 year old male comes in with chief complaint of seizure-like activity.  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  According to the EMS, patient family found him unresponsive and having seizure-like activity versus syncope.  When EMS arrived patient was noted to have a blood sugar of 32.  Patient received dextrose in route and his blood sugar has improved.  At this time patient is more alert and he denies any headache, neck pain, chest pain, abdominal pain, cough, shortness of breath.  He also states that he was feeling well until this event.  He does indicate that he had less p.o. intake today.  He has no history of diabetes and denies any history of seizures or low blood sugars.  History is limited because patient has tracheostomy in place, family is in route.  Past Medical History:  Diagnosis Date  . Arthritis   . Blindness of right eye   . Cancer ([place])    pharyngeal ca  . History of radiation therapy 11/2014   [id number] squamous cell carcinoma of L neck, Stage IVB, unknown primary 70 Gy in 35 fractions treated at [facility], finished 12/25/14  . History of shingles    RASH  02-03-2014  PER PCP RESOLVING  . Hypertension    NO MEDS ,  MONITORED BY PCP  . Hypothyroidism, postradioiodine therapy   . Prostate cancer ([place]) 07/12/2014   DX  SEVERAL  YRS   AGO  WITH RADIATION THERAPY  . Squamous cell carcinoma of head and neck ([place]) 07/12/2014  . 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 Goals of care, counseling/discussion 01/31/2019  . DNR (do not resuscitate) 01/11/2019  . Cachexia ([place]) 01/11/2019  . Lung nodule 01/11/2019  .  Status post laryngectomy 09/06/2018  . Protein-calorie malnutrition, severe 08/20/2018  . Laryngeal cancer ([place]) 08/19/2018  . Perianal abscess 08/10/2017  . Vitamin D deficiency 02/08/2015  . Squamous cell carcinoma of head and neck ([place]) 07/12/2014  . History of prostate cancer 07/12/2014  . Shingles rash 02/03/2014  . Abnormal transaminases 10/02/2013  . Hypothyroid 10/02/2013  . Blind right eye 09/30/2013  . Dyslipidemia 09/30/2013  . Hypertension   . Graves disease     Past Surgical History:  Procedure Laterality Date  . COLONOSCOPY  10/20/2011   Procedure: COLONOSCOPY;  Surgeon: Daneil Dolin, MD;  [facility];  Service: Endoscopy;  Laterality: N/A;  1:00 pm  . COLONOSCOPY N/A 02/03/2018   Procedure: COLONOSCOPY;  Surgeon: Daneil Dolin, MD;  [facility];  Service: Endoscopy;  Laterality: N/A;  2:15  . CRYOABLATION N/A 04/07/2014   Procedure: SALVAGE CRYO ABLATION PROSTATE;  Surgeon: Lowella Bandy, MD;  Location: [facility];  Service: Urology;  Laterality: N/A;  . DIRECT LARYNGOSCOPY N/A 09/06/2018   Procedure: DIRECT LARYNGOSCOPY biopsy with frozen section;  Surgeon: Izora Gala, MD;  Location: [place];  Service: ENT;  Laterality: N/A;  . IR GASTROSTOMY TUBE MOD SED  08/20/2018  . IR GENERIC HISTORICAL  03/27/2017   IR REMOVAL TUN ACCESS W/ PORT W/O FL MOD SED [facility]-INTERV RAD  .  LARYNGETOMY N/A 09/06/2018   Procedure: total LARYNGECTOMY;  Surgeon: Izora Gala, MD;  Location: [place];  Service: ENT;  Laterality: N/A;  . LARYNGOSCOPY    . LYMPH NODE BIOPSY    . RADICAL NECK DISSECTION Right 09/06/2018   Procedure: Modified NECK DISSECTION;  Surgeon: Izora Gala, MD;  Location: [place];  Service: ENT;  Laterality: Right;        Home Medications    Prior to Admission medications   Medication Sig Start Date End Date Taking? Authorizing Provider  hydrochlorothiazide (HYDRODIURIL) 12.5 MG tablet Take 1 tablet (12.5 mg total) by mouth daily. 11/11/18    Dettinger, Fransisca Kaufmann, MD  HYDROcodone-acetaminophen (HYCET) 7.5-325 mg/15 ml solution TAKE (2) TEASPOONFULS EVERY FOUR HOURS AS NEED- ED. 01/27/19   Glennie Isle, NP-C  levothyroxine (SYNTHROID, LEVOTHROID) 100 MCG tablet Place 1 tablet (100 mcg total) into feeding tube daily before breakfast. Patient taking differently: Take 100 mcg by mouth daily before breakfast.  11/11/18   Dettinger, Fransisca Kaufmann, MD  phenol (CHLORASEPTIC) 1.4 % LIQD Use as directed 1 spray in the mouth or throat as needed for throat irritation / pain.    [provider]  polyethylene glycol (MIRALAX / GLYCOLAX) packet Take 17 g by mouth daily.     [provider]  pravastatin (PRAVACHOL) 40 MG tablet Take 1 tablet (40 mg total) by mouth daily. 11/11/18   Dettinger, Fransisca Kaufmann, MD  Vitamin D, Ergocalciferol, (DRISDOL) 1.25 MG (50000 UT) CAPS capsule TAKE 1 CAPSULE EVERY 7 DAYS. 12/03/18   Dettinger, Fransisca Kaufmann, MD    Family History Family History  Problem Relation Age of Onset  . Colon cancer Father   . Hypertension Mother   . Colon cancer Brother   . Colon cancer Brother     Social History Social History   Tobacco Use  . Smoking status: Former Smoker    Packs/day: 1.00    Years: 40.00    Pack years: 40.00    Types: Cigarettes    Last attempt to quit: 09/29/2013    Years since quitting: 5.3  . Smokeless tobacco: Never Used  Substance Use Topics  . Alcohol use: No  . Drug use: No     Allergies   Patient has no known allergies.   Review of Systems Review of Systems  Unable to perform ROS: Patient nonverbal  Constitutional: Positive for activity change.  Respiratory: Negative for shortness of breath.   Cardiovascular: Negative for chest pain.  Gastrointestinal: Negative for diarrhea, nausea and vomiting.  Neurological: Negative for headaches.     Physical Exam Updated Vital Signs BP (!) 151/83   Pulse 65   Temp (!) 97.4 F (36.3 C) (Oral)   Resp 12   Ht 6' (1.829 m)   Wt 52 kg    SpO2 96%   BMI 15.55 kg/m   Physical Exam Vitals signs and nursing note reviewed.  Constitutional:      Appearance: He is well-developed.     Comments: Cachectic appearing male  HENT:     Head: Atraumatic.  Eyes:     Extraocular Movements: Extraocular movements intact.     Pupils: Pupils are equal, round, and reactive to light.  Cardiovascular:     Rate and Rhythm: Normal rate.  Pulmonary:     Effort: Pulmonary effort is normal.  Abdominal:     General: Abdomen is flat.     Tenderness: There is no abdominal tenderness.     Comments: Feeding tube in place  Skin:    General: Skin is warm.  Neurological:     Mental Status: He is oriented to person, place, and time.      ED Treatments / Results  Labs (all labs ordered are listed, but only abnormal results are displayed) Labs Reviewed  BASIC METABOLIC PANEL - Abnormal; Notable for the following components:      Result Value   Glucose, Bld 161 (*)    All other components within normal limits  CBC WITH DIFFERENTIAL/PLATELET - Abnormal; Notable for the following components:   Hemoglobin 11.3 (*)    HCT 38.5 (*)    MCH 24.4 (*)    MCHC 29.4 (*)    RDW 17.6 (*)    All other components within normal limits  CBG MONITORING, ED - Abnormal; Notable for the following components:   Glucose-Capillary 141 (*)    All other components within normal limits  CBG MONITORING, ED - Abnormal; Notable for the following components:   Glucose-Capillary 171 (*)    All other components within normal limits  CBG MONITORING, ED - Abnormal; Notable for the following components:   Glucose-Capillary 133 (*)    All other components within normal limits  CBG MONITORING, ED - Abnormal; Notable for the following components:   Glucose-Capillary 121 (*)    All other components within normal limits  MAGNESIUM  PHOSPHORUS  CBG MONITORING, ED  CBG MONITORING, ED    EKG None  Radiology No results found.  Procedures Procedures (including  critical care time)  Medications Ordered in ED Medications  sodium chloride 0.9 % bolus 1,000 mL (0 mLs Intravenous Stopped 01/31/19 0254)     Initial Impression / Assessment and Plan / ED Course  I have reviewed the triage vital signs and the nursing notes.  Pertinent labs & imaging results that were available during my care of the patient were reviewed by me and considered in my medical decision making (see chart for details).  Clinical Course as of Feb 02 1843  Mon Jan 31, 2019  0867 I had a long discussion with patient and the family.  Patient's lab work-up here is overall normal.  It appears that patient's seizure-like activity was because of low sugar, which is likely because of his poor p.o. intake. We will start oral challenge right now.  Anticipate discharge at this time.   [AN]  0608 Pt reassessed. Pt's VSS and WNL. Pt's cap refill < 3 seconds. Pt has been hydrated in the ER and now passed po challenge. We will discharge with antiemetic. Strict ER return precautions have been discussed and pt will return if he is unable to tolerate fluids and symptoms are getting worse.    [AN]    Clinical Course User Index [AN] Varney Biles, MD   Patient comes in with chief complaint of seizure-like activity.  I received more information from patient's wife and niece.  They report that patient has been eating less over the past few days.  This evening patient stepped out of the bathroom and fell down.  He is then started having seizure-like activity.  When EMS arrived patient's blood sugar was low.  After he was given dextrose, his blood sugar has responded and he has become alert.  Patient states that he been eating less because of throat pain.  He has a feeding tube, however he has not been using it for the last several weeks.  Patient also has not been taking in boost or Ensure which was recommended to him.  History is not suggestive of any underlying infection.  Patient is not on any  anti-glycemic agents.  This simply appears to be a case of failure to thrive.  Labs ordered, will monitor the blood sugar closely and start oral challenge.  Reassessment:  Patient tolerated Ensure and his blood sugar continue to be normal therefore we discharged him.  Family to ensure patient starts getting appropriate nutrition.  They will follow-up with oncology as well. Final Clinical Impressions(s) / ED Diagnoses   Final diagnoses:  Inadequate caloric intake  Failure to thrive in adult  Hypoglycemia    ED Discharge Orders    None       Varney Biles, MD 02/02/19 1845

## 2019-01-31 NOTE — Progress Notes (Signed)
START OFF PATHWAY REGIMEN - Head and Neck   [id number]:Pembrolizumab 200 mg q21 Days:   A cycle is 21 days:     Pembrolizumab   **Always confirm dose/schedule in your pharmacy ordering system**  Patient Characteristics: Larynx, Metastatic, First Line, No Prior Platinum-Based Chemoradiation within 6 Months, PD-L1 Expression Negative (CPS < 1) / Unknown Disease Classification: Larynx Current Disease Status: Metastatic Disease AJCC T Category: TX AJCC 8 Stage Grouping: IVC AJCC N Category: NX AJCC M Category: M1 Line of Therapy: First Line Prior Platinum Status: No Prior Platinum-Based Chemoradiation within 6 Months PD-L1 Expression Status: PD-L1 Negative Intent of Therapy: Non-Curative / Palliative Intent, Discussed with Patient

## 2019-02-03 ENCOUNTER — Other Ambulatory Visit (HOSPITAL_COMMUNITY): Payer: Self-pay | Admitting: Nurse Practitioner

## 2019-02-03 ENCOUNTER — Encounter (HOSPITAL_COMMUNITY): Payer: Self-pay | Admitting: Dietician

## 2019-02-03 NOTE — Progress Notes (Signed)
Nutrition Brief Note  [facility] nurse navigator asked RD to assess pt.   Pt has a hx of laryngeal carcinoma- originally dx in 2015. Received chemoradiation at that time. Cancer recurred middle of last year and he subsequently had total laryngectomy performed 9/9. In the months following, he was able to wean himself off his TF and maintain his wt with oral intake alone. His gastrostomy was left in place in event he ever needed it again. Repeat CT end of December showed tumor recurrence. Pt enrolled w/ hospice in January, but Tumor board reviewed pt and d/t his fairly good functional and tumor markers, pt was felt to be candidate for immunotherapy. Family/pt agreed to tx w/ Beryle Flock and hospice was revoked. Scheduled to start tx this coming Tuesday.   A few days ago, family abruptly requested to restart tube feeding d/t the pt reportedly having minimal intake. Apparently Home health and tube feeding had been completely discontinued. He has not received any supplies in many months and has not even been performing routine PEG flushes.  Pt had been unknown to RD prior. Pt will require full nutrition assessment, but RD unable to see pt until next Tuesday. RD premptively called and spoke w/ pts caregiver-monica. She could give RD very little information. She did not know what TF formula he had in past. She reports the pt has a generally decreased appetite, but cant give any further details about intake. She does say they had been active with [facility]. RD explained restarting home health will not be instantaneous. Until this can occur, RD advised caregiver to place 2 Ensure/Boost supplements through tube each day until to supplement oral intake. She notes understanding  RD will reach out to [facility] and get past TF information. RD will complete full assessment and place orders for new TF regimen when seen next week.   Burtis Junes RD, LDN, CNSC Clinical Nutrition Available Tues-Sat via Pager: [phone] 02/03/2019 12:05 PM

## 2019-02-07 MED ORDER — PROCHLORPERAZINE MALEATE 10 MG PO TABS: 10.0000 mg | ORAL_TABLET | Freq: Four times a day (QID) | ORAL | 1 refills | Status: AC | PRN

## 2019-02-07 MED ORDER — LIDOCAINE-PRILOCAINE 2.5-2.5 % EX CREA: TOPICAL_CREAM | CUTANEOUS | 3 refills | Status: DC

## 2019-02-07 NOTE — Patient Instructions (Signed)
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 You will be treated with pembrolizumab Beryle Flock) every three weeks.  The intent of this treatment is to control your cancer and help alleviate any symptoms your cancer may be causing. You will see the doctor regularly throughout treatment. We monitor your lab work prior to every treatment. The doctor monitors your response to treatment by the way you are feeling, your blood work, and scans periodically.  There will be wait times while you are here for treatment.  It will take about 30 minutes to 1 hour for your lab work to result.  Then there will be wait times while pharmacy mixes your medications.    Pembrolizumab Beryle Flock)  About This Drug Pembrolizumab is used to treat cancer. It is given in the vein (IV).  Possible Side Effects . Nausea . Diarrhea (loose bowel movements) . Constipation (not able to move bowels) . Pain in your abdomen . Tiredness . Fever . Decreased appetite (decreased hunger) . Muscle and bone pain . Trouble breathing . Cough . Rash . Itching  Note: Each of the side effects above was reported in 20% or greater of patients treated with pembrolizumab. Your side effects may be different if you are receiving pembrolizumab in combination with another chemotherapy agent. Not all possible side effects are included above.  Warnings and Precautions  . This drug works with your immune system and can cause inflammation in any of your organs and tissues and can change how they work. This may put you at risk for developing serious medical problems, which can be life-threatening. . Inflammation in the colon (colitis), which can be life-threatening - symptoms are loose bowel movements (diarrhea) stomach cramping, and sometimes blood in the bowel movements . Changes in liver function . Changes in kidney function . Inflammation (swelling) of the  lungs, which can be life-threatening. You may have a dry cough or trouble breathing.  SELF CARE ACTIVITIES WHILE ON IMMUNOTHERAPY:  Hydration Increase your fluid intake 48 hours prior to treatment and drink at least 8 to 12 cups (64 ounces) of water/decaffeinated beverages per day after treatment. You can still have your cup of coffee or soda but these beverages do not count as part of your 8 to 12 cups that you need to drink daily. No alcohol intake.  Medications Continue taking your normal prescription medication as prescribed.  If you start any new herbal or new supplements please let us know first to make sure it is safe.  Mouth Care Have teeth cleaned professionally before starting treatment. Keep dentures and partial plates clean. Use soft toothbrush and do not use mouthwashes that contain alcohol. Biotene is a good mouthwash that is available at most pharmacies or may be ordered by calling [phone]. Use warm salt water gargles (1 teaspoon salt per 1 quart warm water) before and after meals and at bedtime. Or you may rinse with 2 tablespoons of three-percent hydrogen peroxide mixed in eight ounces of water. If you are still having problems with your mouth or sores in your mouth please call the clinic. If you need dental work, please let the doctor know before you go for your appointment so that we can coordinate the best possible time for you in regards to your chemo regimen. You need to also let your dentist know that you are actively taking chemo. We may need to do labs prior to your dental appointment.  Skin Care Always use sunscreen that has not expired and with SPF (Sun Protection Factor) of 50 or higher. Wear hats to protect your head from the sun. Remember to use sunscreen on your hands, ears, face, & feet.  Use good moisturizing lotions such as udder cream, eucerin, or even Vaseline. Some chemotherapies can cause dry skin, color changes in your skin and nails.    . Avoid long,  hot showers or baths. . Use gentle, fragrance-free soaps and laundry detergent. . Use moisturizers, preferably creams or ointments rather than lotions because the thicker consistency is better at preventing skin dehydration. Apply the cream or ointment within 15 minutes of showering. Reapply moisturizer at night, and moisturize your hands every time after you wash them.   Infection Prevention Please wash your hands for at least 30 seconds using warm soapy water. Handwashing is the #1 way to prevent the spread of germs. Stay away from sick people or people who are getting over a cold. If you develop respiratory systems such as green/yellow mucus production or productive cough or persistent cough let us know and we will see if you need an antibiotic. It is a good idea to keep a pair of gloves on when going into grocery stores/[employer] to decrease your risk of coming into contact with germs on the carts, etc. Carry alcohol hand gel with you at all times and use it frequently if out in public. If your temperature reaches 100.5 or higher please call the clinic and let us know.  If it is after hours or on the weekend please go to the ER if your temperature is over 100.5.  Please have your own personal thermometer at home to use.    Sex and bodily fluids If you are going to have sex, a condom must be used to protect the person that isn't taking chemotherapy. Chemo can decrease your libido (sex drive). For a few days after chemotherapy, chemotherapy can be excreted through your bodily fluids.  When using the toilet please close the lid and flush the toilet twice.  Do this for a few day after you have had chemotherapy.   Contraception It's important to use reliable contraception during treatment. Avoid getting pregnant while you or your partner are having chemotherapy. This is because the drugs may harm the baby. Sometimes chemotherapy drugs can leave a man or woman infertile.  This means you would not be able to  have children in the future. You might want to talk to someone about permanent infertility. It can be very difficult to learn that you may no longer be able to have children. Some people find counselling helpful. There might be ways to preserve your fertility, although this is easier for men than for women. You may want to speak to a fertility expert. You can talk about sperm banking or harvesting your eggs. You can also ask about other fertility options, such as donor eggs. If you have or have had breast cancer, your doctor might advise you not to take the contraceptive pill. This is because the hormones in it might affect the cancer.  It is not known for sure whether or not chemotherapy drugs can be passed on through semen or secretions from the vagina. Because of this some doctors advise people to use a barrier method if you have sex during treatment. This applies to vaginal, anal or oral sex. Generally, doctors advise a barrier method only for the time you are actually having the treatment and for about  a week after your treatment. Advice like this can be worrying, but this does not mean that you have to avoid being intimate with your partner. You can still have close contact with your partner and continue to enjoy sex.  Animals If you have cats or birds we just ask that you not change the litter or change the cage.  Please have someone else do this for you while you are on chemotherapy.   Food Safety During and After Cancer Treatment Food safety is important for people both during and after cancer treatment. Cancer and cancer treatments, such as chemotherapy, radiation therapy, and stem cell/bone marrow transplantation, often weaken the immune system. This makes it harder for your body to protect itself from foodborne illness, also called food poisoning. Foodborne illness is caused by eating food that contains harmful bacteria, parasites, or viruses.  Foods to avoid  Some foods have a higher risk  of becoming tainted with bacteria. These include: Marland Kitchen Unwashed fresh fruit and vegetables, especially leafy vegetables that can hide dirt and other contaminants . Raw sprouts, such as alfalfa sprouts . Raw or undercooked beef, especially ground beef, or other raw or undercooked meat and poultry . Fatty, fried, or spicy foods immediately before or after treatment.  These can sit heavy on your stomach and make you feel nauseous. . Raw or undercooked shellfish, such as oysters. . Sushi and sashimi, which often contain raw fish.  . Unpasteurized beverages, such as unpasteurized fruit juices, raw milk, raw yogurt, or cider . Undercooked eggs, such as soft boiled, over easy, and poached; raw, unpasteurized eggs; or foods made with raw egg, such as homemade raw cookie dough and homemade mayonnaise  Simple steps for food safety  Shop smart. . Do not buy food stored or displayed in an unclean area. . Do not buy bruised or damaged fruits or vegetables. . Do not buy cans that have cracks, dents, or bulges. . Pick up foods that can spoil at the end of your shopping trip and store them in a cooler on the way home.  Prepare and clean up foods carefully. . Rinse all fresh fruits and vegetables under running water, and dry them with a clean towel or paper towel. . Clean the top of cans before opening them. . After preparing food, wash your hands for 20 seconds with hot water and soap. Pay special attention to areas between fingers and under nails. . Clean your utensils and dishes with hot water and soap. Marland Kitchen Disinfect your kitchen and cutting boards using 1 teaspoon of liquid, unscented bleach mixed into 1 quart of water.    Dispose of old food. . Eat canned and packaged food before its expiration date (the "use by" or "best before" date). . Consume refrigerated leftovers within 3 to 4 days. After that time, throw out the food. Even if the food does not smell or look spoiled, it still may be unsafe. Some  bacteria, such as Listeria, can grow even on foods stored in the refrigerator if they are kept for too long.  Take precautions when eating out. . At restaurants, avoid buffets and salad bars where food sits out for a long time and comes in contact with many people. Food can become contaminated when someone with a virus, often a norovirus, or another "bug" handles it. . Put any leftover food in a "to-go" container yourself, rather than having the server do it. And, refrigerate leftovers as soon as you get home. . Choose restaurants that  are clean and that are willing to prepare your food as you order it cooked.   MEDICATIONS:                                                                                                                                                                Compazine/Prochlorperazine 10mg  tablet. Take 1 tablet every 6 hours as needed for nausea/vomiting. (This can make you sleepy)   EMLA cream. Apply a quarter size amount to port site 1 hour prior to chemo. Do not rub in. Cover with plastic wrap.   Over-the-Counter Meds:  Colace - 100 mg capsules - take 2 capsules daily.  If this doesn't help then you can increase to 2 capsules twice daily.  Call us if this does not help your bowels move.   Imodium 2mg  capsule. Take 2 capsules after the 1st loose stool and then 1 capsule every 2 hours until you go a total of 12 hours without having a loose stool. Call the [facility] if loose stools continue. If diarrhea occurs at bedtime, take 2 capsules at bedtime. Then take 2 capsules every 4 hours until morning. Call [facility].    Diarrhea Sheet   If you are having loose stools/diarrhea, please purchase Imodium and begin taking as outlined:  At the first sign of poorly formed or loose stools you should begin taking Imodium (loperamide) 2 mg capsules.  Take two caplets (4mg ) followed by one caplet (2mg ) every 2 hours until you have had no diarrhea for 12 hours.  During  the night take two caplets (4mg ) at bedtime and continue every 4 hours during the night until the morning.  Stop taking Imodium only after there is no sign of diarrhea for 12 hours.    Always call the [facility] if you are having loose stools/diarrhea that you can't get under control.  Loose stools/diarrhea leads to dehydration (loss of water) in your body.  We have other options of trying to get the loose stools/diarrhea to stop but you must let us know!   Constipation Sheet  Colace - 100 mg capsules - take 2 capsules daily.  If this doesn't help then you can increase to 2 capsules twice daily.  Please call if the above does not work for you.   Do not go more than 2 days without a bowel movement.  It is very important that you do not become constipated.  It will make you feel sick to your stomach (nausea) and can cause abdominal pain and vomiting.   Nausea Sheet   Compazine/Prochlorperazine 10mg  tablet. Take 1 tablet every 6 hours as needed for nausea/vomiting. (This can make you sleepy)  If you are having persistent nausea (nausea that does not stop) please call the [facility] and let  us know the amount of nausea that you are experiencing.  If you begin to vomit, you need to call the [facility] and if it is the weekend and you have vomited more than one time and can't get it to stop-go to the Emergency Room.  Persistent nausea/vomiting can lead to dehydration (loss of fluid in your body) and will make you feel terrible.   Ice chips, sips of clear liquids, foods that are @ room temperature, crackers, and toast tend to be better tolerated.   SYMPTOMS TO REPORT AS SOON AS POSSIBLE AFTER TREATMENT:   FEVER GREATER THAN 100.5 F  CHILLS WITH OR WITHOUT FEVER  NAUSEA AND VOMITING THAT IS NOT CONTROLLED WITH YOUR NAUSEA MEDICATION  UNUSUAL SHORTNESS OF BREATH  UNUSUAL BRUISING OR BLEEDING  TENDERNESS IN MOUTH AND THROAT WITH OR WITHOUT PRESENCE OF ULCERS  URINARY  PROBLEMS  BOWEL PROBLEMS  UNUSUAL RASH      Wear comfortable clothing and clothing appropriate for easy access to any Portacath or PICC line. Let us know if there is anything that we can do to make your therapy better!    What to do if you need assistance after hours or on the weekends: CALL [phone].  HOLD on the line, do not hang up.  You will hear multiple messages but at the end you will be connected with a nurse triage line.  They will contact the doctor if necessary.  Most of the time they will be able to assist you.  Do not call the hospital operator.      I have been informed and understand all of the instructions given to me and have received a copy. I have been instructed to call the clinic [phone] or my family physician as soon as possible for continued medical care, if indicated. I do not have any more questions at this time but understand that I may call the [facility] or the Patient Navigator at [phone] during office hours should I have questions or need assistance in obtaining follow-up care.

## 2019-02-08 ENCOUNTER — Encounter (HOSPITAL_COMMUNITY): Payer: Self-pay | Admitting: Hematology

## 2019-02-08 ENCOUNTER — Other Ambulatory Visit: Payer: Self-pay

## 2019-02-08 ENCOUNTER — Inpatient Hospital Stay (HOSPITAL_COMMUNITY): Payer: Medicare HMO

## 2019-02-08 ENCOUNTER — Encounter (HOSPITAL_COMMUNITY): Admitting: Dietician

## 2019-02-08 ENCOUNTER — Inpatient Hospital Stay (HOSPITAL_COMMUNITY): Payer: Medicare HMO | Attending: Hematology | Admitting: Hematology

## 2019-02-08 ENCOUNTER — Inpatient Hospital Stay (HOSPITAL_COMMUNITY): Payer: Medicare HMO | Admitting: Dietician

## 2019-02-08 VITALS — BP 147/79 | HR 71 | Temp 98.0°F | Resp 18

## 2019-02-08 DIAGNOSIS — Z79899 Other long term (current) drug therapy: Secondary | ICD-10-CM | POA: Insufficient documentation

## 2019-02-08 DIAGNOSIS — R634 Abnormal weight loss: Secondary | ICD-10-CM | POA: Insufficient documentation

## 2019-02-08 DIAGNOSIS — C329 Malignant neoplasm of larynx, unspecified: Secondary | ICD-10-CM

## 2019-02-08 DIAGNOSIS — C760 Malignant neoplasm of head, face and neck: Secondary | ICD-10-CM

## 2019-02-08 DIAGNOSIS — M129 Arthropathy, unspecified: Secondary | ICD-10-CM

## 2019-02-08 DIAGNOSIS — I10 Essential (primary) hypertension: Secondary | ICD-10-CM | POA: Diagnosis not present

## 2019-02-08 DIAGNOSIS — Z9221 Personal history of antineoplastic chemotherapy: Secondary | ICD-10-CM | POA: Diagnosis not present

## 2019-02-08 DIAGNOSIS — E039 Hypothyroidism, unspecified: Secondary | ICD-10-CM

## 2019-02-08 DIAGNOSIS — Z8546 Personal history of malignant neoplasm of prostate: Secondary | ICD-10-CM | POA: Diagnosis not present

## 2019-02-08 DIAGNOSIS — Z923 Personal history of irradiation: Secondary | ICD-10-CM | POA: Diagnosis not present

## 2019-02-08 DIAGNOSIS — Z800 Family history of malignant neoplasm of digestive organs: Secondary | ICD-10-CM

## 2019-02-08 DIAGNOSIS — Z5112 Encounter for antineoplastic immunotherapy: Secondary | ICD-10-CM

## 2019-02-08 DIAGNOSIS — C4442 Squamous cell carcinoma of skin of scalp and neck: Secondary | ICD-10-CM

## 2019-02-08 DIAGNOSIS — Z87891 Personal history of nicotine dependence: Secondary | ICD-10-CM | POA: Insufficient documentation

## 2019-02-08 DIAGNOSIS — R630 Anorexia: Secondary | ICD-10-CM | POA: Insufficient documentation

## 2019-02-08 LAB — CBC WITH DIFFERENTIAL/PLATELET
Abs Immature Granulocytes: 0.01 10*3/uL (ref 0.00–0.07)
Basophils Absolute: 0 10*3/uL (ref 0.0–0.1)
Basophils Relative: 1 %
Eosinophils Absolute: 0 10*3/uL (ref 0.0–0.5)
Eosinophils Relative: 1 %
HCT: 40.7 % (ref 39.0–52.0)
Hemoglobin: 12.1 g/dL — ABNORMAL LOW (ref 13.0–17.0)
IMMATURE GRANULOCYTES: 0 %
Lymphocytes Relative: 24 %
Lymphs Abs: 1.1 10*3/uL (ref 0.7–4.0)
MCH: 24.9 pg — ABNORMAL LOW (ref 26.0–34.0)
MCHC: 29.7 g/dL — ABNORMAL LOW (ref 30.0–36.0)
MCV: 83.7 fL (ref 80.0–100.0)
Monocytes Absolute: 0.5 10*3/uL (ref 0.1–1.0)
Monocytes Relative: 10 %
NEUTROS PCT: 64 %
Neutro Abs: 3 10*3/uL (ref 1.7–7.7)
PLATELETS: 256 10*3/uL (ref 150–400)
RBC: 4.86 MIL/uL (ref 4.22–5.81)
RDW: 18.1 % — ABNORMAL HIGH (ref 11.5–15.5)
WBC: 4.6 10*3/uL (ref 4.0–10.5)
nRBC: 0 % (ref 0.0–0.2)

## 2019-02-08 LAB — COMPREHENSIVE METABOLIC PANEL
ALT: 20 U/L (ref 0–44)
AST: 23 U/L (ref 15–41)
Albumin: 3.5 g/dL (ref 3.5–5.0)
Alkaline Phosphatase: 64 U/L (ref 38–126)
Anion gap: 10 (ref 5–15)
BUN: 10 mg/dL (ref 8–23)
CHLORIDE: 97 mmol/L — AB (ref 98–111)
CO2: 26 mmol/L (ref 22–32)
Calcium: 8.9 mg/dL (ref 8.9–10.3)
Creatinine, Ser: 0.73 mg/dL (ref 0.61–1.24)
GFR calc Af Amer: >60 mL/min (ref >60)
GFR calc non Af Amer: >60 mL/min (ref >60)
Glucose, Bld: 97 mg/dL (ref 70–99)
Potassium: 3.9 mmol/L (ref 3.5–5.1)
Sodium: 133 mmol/L — ABNORMAL LOW (ref 135–145)
Total Bilirubin: 0.6 mg/dL (ref 0.3–1.2)
Total Protein: 7.1 g/dL (ref 6.5–8.1)

## 2019-02-08 LAB — TSH: TSH: 21.409 u[IU]/mL — ABNORMAL HIGH (ref 0.350–4.500)

## 2019-02-08 MED ORDER — SODIUM CHLORIDE 0.9 % IV SOLN
Freq: Once | INTRAVENOUS | Status: AC
  Administered 2019-02-08: 13:00:00 via INTRAVENOUS

## 2019-02-08 MED ORDER — HEPARIN SOD (PORK) LOCK FLUSH 100 UNIT/ML IV SOLN: 500.0000 [IU] | Freq: Once | INTRAVENOUS | Status: DC | PRN

## 2019-02-08 MED ORDER — SODIUM CHLORIDE 0.9% FLUSH
10.0000 mL | INTRAVENOUS | Status: DC | PRN
  Administered 2019-02-08: 10 mL
  Filled 2019-02-08: qty 10

## 2019-02-08 MED ORDER — SODIUM CHLORIDE 0.9 % IV SOLN
200.0000 mg | Freq: Once | INTRAVENOUS | Status: AC
  Administered 2019-02-08: 200 mg via INTRAVENOUS
  Filled 2019-02-08: qty 8

## 2019-02-08 NOTE — Assessment & Plan Note (Signed)
1.  Recurrent squamous cell carcinoma of the head and neck: -Patient with 40-pack-year smoking history, quit in 2015. 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 - PET CT scan on 08/06/2018 shows large recurrent hypermetabolic right supraglottic mass with single hypermetabolic level 2 lymph node on the right. - On 09/06/2018, he underwent total laryngectomy and right neck dissection of levels 2, 3, and 4. - We discussed the pathology which showed invasive squamous cell carcinoma, poorly differentiated, spanning 3.9 cm, carcinoma involves skeletal muscle, thyroid cartilage and cricoid cartilage.  Positive lymphovascular invasion and perineural invasion.  No evidence of carcinoma in 4 out of 4 lymph nodes. - He was reportedly placed in hospice by his pulmonologist 2 to 3 days ago. - We discussed the results of the CT soft tissue neck on 12/23/2018 showing large enhancing mass in the region of the right tonsil, new and measures 28 x 24 mm.  Ring-enhancing fluid collection in the anterior neck below the base of the tongue is also new.  This could represent recurrent tumor or postop fluid.  Enlarging enhancing right level 2B lymph node measuring 14 x 21 mm compatible with tumor growth.  8 mm necrotic lymph node in the left anterior mid neck compatible with tumor.  15 mm necrotic node in the left lower neck compatible with tumor.  12 mm spiculated mass in the left apex new compared to prior studies.  4 mm and 5 mm left upper lobe nodules are also new. - Patient is in fairly good performance status.  He did not experience any major side effects from prior chemotherapy in 2015.  He does all his ADLs and IADLs. -His appetite has been declining.  He has not been using PEG tube since October 2019.  He is not able to swallow solid foods.  We will make a referral to  our dietitian. -We have reviewed results of PDL 1 testing.  CPS score is 1%. -PET/CT scan on 01/26/2019 shows bulky intensely hypermetabolic tissue in the posterior right oropharynx consistent with squamous cell carcinoma recurrence.  Bilateral hypermetabolic nodal metastasis within the cervical lymph nodes.  No evidence of metastatic disease outside the neck. -Based on this, we have recommended single agent pembrolizumab ([id number] study).  Objective responses are quoted around 20%. -We discussed the side effects of immunotherapy in detail.  He will proceed with his first cycle today.  We will see him back in 3 weeks for follow-up.

## 2019-02-08 NOTE — Addendum Note (Signed)
Addended by: Derek Jack on: 02/08/2019 12:40 PM   Modules accepted: Orders

## 2019-02-08 NOTE — Treatment Plan (Signed)
Brandon Hall to check if patient is compliant with his daily levothyroxine and we will re check his TSH next visit also

## 2019-02-08 NOTE — Progress Notes (Signed)
Patient seen by Dr. Delton Coombes with lab review and to treat today.  Reviewed Keytruda information.  Consent signed.  All questions asked and answered.  Family at side.  No s/s of distress noted.   Patient tolerated infusion with no complaints voiced.  Peripheral IV site with good blood return noted before and after infusion.  No bruising at site.  Band aid applied.  VSs with discharge and left ambulatory with family with no s/s of distress noted.

## 2019-02-08 NOTE — Progress Notes (Signed)
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  BRIEF ONCOLOGIC HISTORY:    Squamous cell carcinoma of head and neck (HCC)   05/05/2014 Imaging    CT scan of the neck show enlarged left lymph node measure less than 6 cm.    05/25/2014 Pathology Results    [phone] is positive for squamous cell carcinoma.    05/25/2014 Procedure    Lymph node biopsy of the left neck came back squamous cell carcinoma.    06/20/2014 Imaging    PET CT scan show no evidence of distant metastatic disease.    06/29/2014 Surgery    Laryngoscopy and random biopsy of the nasopharynx was negative for malignancy.    07/27/2014 - 09/27/2014 Chemotherapy    Cisplatin + 5FU (days 1-5) every 28 days 4 cycles    11/06/2014 - 12/25/2014 Radiation Therapy    In [place] with Dr. Isidore Hall    04/25/2015 Imaging    PET - [facility].  No evidence of hypermetabolic recurrent or metastatic disease.    05/17/2015 Pathology Results    [id number]:  p16 (HPV) negative.    02/08/2019 -  Chemotherapy    The patient had pembrolizumab (KEYTRUDA) 200 mg in sodium chloride 0.9 % 50 mL chemo infusion, 200 mg, Intravenous, Once, 0 of 4 cycles  for chemotherapy treatment.      Laryngeal cancer ([place])   08/19/2018 Initial Diagnosis    Laryngeal cancer ([place])    02/08/2019 -  Chemotherapy    The patient had pembrolizumab (KEYTRUDA) 200 mg in sodium chloride 0.9 % 50 mL chemo infusion, 200 mg, Intravenous, Once, 0 of 4 cycles  for chemotherapy treatment.       CANCER STAGING: Cancer Staging Squamous cell carcinoma of head and neck (HCC) Staging form: Pharynx - Nasopharynx, AJCC 7th Edition - Clinical: Stage IVB (TX, N3a, M0) - Signed by  Brandon Gibson, MD on 07/13/2014    INTERVAL HISTORY:  [name] 63 y.o. male returns for routine follow-up for invasive squamous cell carcinoma of the pharynx. He is here today with his family. He is not eating very well. He has not used his PEG tue since September. He has lost 10 pound since December. Denies any nausea, vomiting, or diarrhea. Denies any new pains. Had not noticed any recent bleeding such as epistaxis, hematuria or hematochezia. Denies recent chest pain on exertion, shortness of breath on minimal exertion, pre-syncopal episodes, or palpitations. Denies any numbness or tingling in hands or feet. Denies any recent fevers, infections, or recent hospitalizations. Patient reports appetite at 25% and energy level at 25%.    REVIEW OF SYSTEMS:  Review of Systems  HENT:   Positive for trouble swallowing.   All other systems reviewed and are negative.    PAST MEDICAL/SURGICAL HISTORY:  Past Medical History:  Diagnosis Date  . Arthritis   . Blindness of right eye   . Cancer ([place])    pharyngeal ca  . History of radiation therapy 11/2014   TxN3MO squamous cell carcinoma of L neck, Stage IVB, unknown primary 70 Gy in 35 fractions treated at [facility], finished 12/25/14  . History of shingles    RASH  02-03-2014  PER PCP RESOLVING  . Hypertension  NO MEDS ,  MONITORED BY PCP  . Hypothyroidism, postradioiodine therapy   . Prostate cancer ([place]) 07/12/2014   DX  SEVERAL  YRS   AGO  WITH RADIATION THERAPY  . Squamous cell carcinoma of head and neck ([place]) 07/12/2014  . 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 COLONOSCOPY  10/20/2011   Procedure: COLONOSCOPY;  Surgeon: Brandon Dolin, MD;  [facility];  Service: Endoscopy;  Laterality: N/A;  1:00 pm  . COLONOSCOPY N/A 02/03/2018   Procedure: COLONOSCOPY;  Surgeon: Brandon Dolin, MD;  [facility];  Service: Endoscopy;  Laterality: N/A;   2:15  . CRYOABLATION N/A 04/07/2014   Procedure: SALVAGE CRYO ABLATION PROSTATE;  Surgeon: Brandon Bandy, MD;  Location: [facility];  Service: Urology;  Laterality: N/A;  . DIRECT LARYNGOSCOPY N/A 09/06/2018   Procedure: DIRECT LARYNGOSCOPY biopsy with frozen section;  Surgeon: Brandon Gala, MD;  Location: [facility];  Service: ENT;  Laterality: N/A;  . IR GASTROSTOMY TUBE MOD SED  08/20/2018  . IR GENERIC HISTORICAL  03/27/2017   IR REMOVAL TUN ACCESS W/ PORT W/O FL MOD SED [facility]-INTERV RAD  . LARYNGETOMY N/A 09/06/2018   Procedure: total LARYNGECTOMY;  Surgeon: Brandon Gala, MD;  Location: [facility];  Service: ENT;  Laterality: N/A;  . LARYNGOSCOPY    . LYMPH NODE BIOPSY    . RADICAL NECK DISSECTION Right 09/06/2018   Procedure: Modified NECK DISSECTION;  Surgeon: Brandon Gala, MD;  Location: [facility];  Service: ENT;  Laterality: Right;     SOCIAL HISTORY:  Social History   Socioeconomic History  . Marital status: Married    Spouse name: Not on file  . Number of children: Not on file  . Years of education: Not on file  . Highest education level: Not on file  Occupational History  . Occupation: disabled  Social Needs  . Financial resource strain: Not very hard  . Food insecurity:    Worry: Never true    Inability: Never true  . Transportation needs:    Medical: No    Non-medical: No  Tobacco Use  . Smoking status: Former Smoker    Packs/day: 1.00    Years: 40.00    Pack years: 40.00    Types: Cigarettes    Last attempt to quit: 09/29/2013    Years since quitting: 5.3  . Smokeless tobacco: Never Used  Substance and Sexual Activity  . Alcohol use: No  . Drug use: No  . Sexual activity: Not on file  Lifestyle  . Physical activity:    Days per week: 3 days    Minutes per session: 30 min  . Stress: Only a little  Relationships  . Social connections:    Talks on phone: Never    Gets together: More than three times a week    Attends religious service: More than 4 times per  year    Active member of club or organization: Yes    Attends meetings of clubs or organizations: More than 4 times per year    Relationship status: Married  . Intimate partner violence:    Fear of current or ex partner: No    Emotionally abused: No    Physically abused: No    Forced sexual activity: No  Other Topics Concern  . Not on file  Social History Narrative   Lives with wife in an apartment. He has stepchildren. They  walk most everywhere they go locally but have access to transportation if needed.    FAMILY HISTORY:  Family History  Problem Relation Age of Onset  . Colon cancer Father   . Hypertension Mother   . Colon cancer Brother   . Colon cancer Brother     CURRENT MEDICATIONS:  Outpatient Encounter Medications as of 02/08/2019  Medication Sig Note  . hydrochlorothiazide (HYDRODIURIL) 12.5 MG tablet Take 1 tablet (12.5 mg total) by mouth daily.   Marland Kitchen HYDROcodone-acetaminophen (HYCET) 7.5-325 mg/15 ml solution TAKE (2) TEASPOONFULS EVERY FOUR HOURS AS NEED- ED.   Marland Kitchen levothyroxine (SYNTHROID, LEVOTHROID) 100 MCG tablet Place 1 tablet (100 mcg total) into feeding tube daily before breakfast. (Patient taking differently: Take 100 mcg by mouth daily before breakfast. )   . lidocaine-prilocaine (EMLA) cream Apply to affected area once   . Pembrolizumab (KEYTRUDA IV) Inject into the vein every 21 ( twenty-one) days.   . phenol (CHLORASEPTIC) 1.4 % LIQD Use as directed 1 spray in the mouth or throat as needed for throat irritation / pain.   . polyethylene glycol (MIRALAX / GLYCOLAX) packet Take 17 g by mouth daily.  09/21/2018: Takes as needed.  . pravastatin (PRAVACHOL) 40 MG tablet Take 1 tablet (40 mg total) by mouth daily.   . prochlorperazine (COMPAZINE) 10 MG tablet Take 1 tablet (10 mg total) by mouth every 6 (six) hours as needed (Nausea or vomiting).   . Vitamin D, Ergocalciferol, (DRISDOL) 1.25 MG (50000 UT) CAPS capsule TAKE 1 CAPSULE EVERY 7 DAYS.    No  facility-administered encounter medications on file as of 02/08/2019.     ALLERGIES:  No Known Allergies   PHYSICAL EXAM:  ECOG Performance status: 1  Vitals:   02/08/19 1100  BP: (!) 118/94  Pulse: (!) 118  Resp: 18  Temp: 98.7 F (37.1 C)  SpO2: 98%   Filed Weights   02/08/19 1100  Weight: 115 lb (52.2 kg)    Physical Exam Constitutional:      Appearance: Normal appearance. He is normal weight.  Musculoskeletal: Normal range of motion.  Skin:    General: Skin is warm and dry.  Neurological:     Mental Status: He is alert and oriented to person, place, and time. Mental status is at baseline.  Psychiatric:        Mood and Affect: Mood normal.        Behavior: Behavior normal.        Thought Content: Thought content normal.        Judgment: Judgment normal.      LABORATORY DATA:  I have reviewed the labs as listed.  CBC    Component Value Date/Time   WBC 4.6 02/08/2019 1112   RBC 4.86 02/08/2019 1112   HGB 12.1 (L) 02/08/2019 1112   HGB 14.8 11/11/2018 1147   HCT 40.7 02/08/2019 1112   HCT 47.0 11/11/2018 1147   PLT 256 02/08/2019 1112   PLT 180 11/11/2018 1147   MCV 83.7 02/08/2019 1112   MCV 78 (L) 11/11/2018 1147   MCH 24.9 (L) 02/08/2019 1112   MCHC 29.7 (L) 02/08/2019 1112   RDW 18.1 (H) 02/08/2019 1112   RDW 17.8 (H) 11/11/2018 1147   LYMPHSABS 1.1 02/08/2019 1112   LYMPHSABS 1.1 11/11/2018 1147   MONOABS 0.5 02/08/2019 1112   EOSABS 0.0 02/08/2019 1112   EOSABS 0.0 11/11/2018 1147   BASOSABS 0.0 02/08/2019 1112   BASOSABS 0.0 11/11/2018 1147   CMP Latest Ref Rng &  Units 02/08/2019 01/31/2019 11/11/2018  Glucose 70 - 99 mg/dL 97 161(H) 80  BUN 8 - 23 mg/dL 10 20 14   Creatinine 0.61 - 1.24 mg/dL 0.73 0.75 0.87  Sodium 135 - 145 mmol/L 133(L) 137 142  Potassium 3.5 - 5.1 mmol/L 3.9 3.7 4.1  Chloride 98 - 111 mmol/L 97(L) 101 99  CO2 22 - 32 mmol/L 26 27 24   Calcium 8.9 - 10.3 mg/dL 8.9 9.0 9.7  Total Protein 6.5 - 8.1 g/dL 7.1 - 7.4  Total  Bilirubin 0.3 - 1.2 mg/dL 0.6 - 0.3  Alkaline Phos 38 - 126 U/L 64 - 83  AST 15 - 41 U/L 23 - 20  ALT 0 - 44 U/L 20 - 15       DIAGNOSTIC IMAGING:  I have independently reviewed the scans and discussed with the patient.   I have reviewed Francene Finders, NP's note and agree with the documentation.  I personally performed a face-to-face visit, made revisions and my assessment and plan is as follows.    ASSESSMENT & PLAN:   Squamous cell carcinoma of head and neck 1.  Recurrent squamous cell carcinoma of the head and neck: -Patient with 40-pack-year smoking history, quit in 2015. 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 - PET CT scan on 08/06/2018 shows large recurrent hypermetabolic right supraglottic mass with single hypermetabolic level 2 lymph node on the right. - On 09/06/2018, he underwent total laryngectomy and right neck dissection of levels 2, 3, and 4. - We discussed the pathology which showed invasive squamous cell carcinoma, poorly differentiated, spanning 3.9 cm, carcinoma involves skeletal muscle, thyroid cartilage and cricoid cartilage.  Positive lymphovascular invasion and perineural invasion.  No evidence of carcinoma in 4 out of 4 lymph nodes. - He was reportedly placed in hospice by his pulmonologist 2 to 3 days ago. - We discussed the results of the CT soft tissue neck on 12/23/2018 showing large enhancing mass in the region of the right tonsil, new and measures 28 x 24 mm.  Ring-enhancing fluid collection in the anterior neck below the base of the tongue is also new.  This could represent recurrent tumor or postop fluid.  Enlarging enhancing right level 2B lymph node measuring 14 x 21 mm compatible with tumor growth.  8 mm necrotic lymph node in the left anterior mid neck compatible with tumor.  15 mm necrotic node  in the left lower neck compatible with tumor.  12 mm spiculated mass in the left apex new compared to prior studies.  4 mm and 5 mm left upper lobe nodules are also new. - Patient is in fairly good performance status.  He did not experience any major side effects from prior chemotherapy in 2015.  He does all his ADLs and IADLs. -His appetite has been declining.  He has not been using PEG tube since October 2019.  He is not able to swallow solid foods.  We will make a referral to our dietitian. -We have reviewed results of PDL 1 testing.  CPS score is 1%. -PET/CT scan on 01/26/2019 shows bulky intensely hypermetabolic tissue in the posterior right oropharynx consistent with squamous cell carcinoma recurrence.  Bilateral hypermetabolic nodal metastasis within the cervical lymph nodes.  No evidence of metastatic disease outside the neck. -Based on this, we have recommended single agent pembrolizumab (keynote-048 study).  Objective  responses are quoted around 20%. -We discussed the side effects of immunotherapy in detail.  He will proceed with his first cycle today.  We will see him back in 3 weeks for follow-up.       Orders placed this encounter:  No orders of the defined types were placed in this encounter.     Derek Jack, MD [place] [phone]

## 2019-02-08 NOTE — Patient Instructions (Signed)
[facility] Discharge Instructions for Patients Receiving Chemotherapy  Today you received the following chemotherapy agents keytruda.     If you develop nausea and vomiting that is not controlled by your nausea medication, call the clinic.   BELOW ARE SYMPTOMS THAT SHOULD BE REPORTED IMMEDIATELY:  *FEVER GREATER THAN 100.5 F  *CHILLS WITH OR WITHOUT FEVER  NAUSEA AND VOMITING THAT IS NOT CONTROLLED WITH YOUR NAUSEA MEDICATION  *UNUSUAL SHORTNESS OF BREATH  *UNUSUAL BRUISING OR BLEEDING  TENDERNESS IN MOUTH AND THROAT WITH OR WITHOUT PRESENCE OF ULCERS  *URINARY PROBLEMS  *BOWEL PROBLEMS  UNUSUAL RASH Items with * indicate a potential emergency and should be followed up as soon as possible.  Feel free to call the clinic should you have any questions or concerns. The clinic phone number is [phone].  Please show the CHEMO ALERT CARD at check-in to the Emergency Department and triage nurse.   

## 2019-02-08 NOTE — Patient Instructions (Addendum)
[facility] at [facility] Discharge Instructions Follow up in 3 weeks with labs and treatment.    Thank you for choosing [facility] at [facility] to provide your oncology and hematology care.  To afford each patient quality time with our provider, please arrive at least 15 minutes before your scheduled appointment time.   If you have a lab appointment with the Cancer Center please come in thru the  Main Entrance and check in at the main information desk  You need to re-schedule your appointment should you arrive 10 or more minutes late.  We strive to give you quality time with our providers, and arriving late affects you and other patients whose appointments are after yours.  Also, if you no show three or more times for appointments you may be dismissed from the clinic at the providers discretion.     Again, thank you for choosing [facility].  Our hope is that these requests will decrease the amount of time that you wait before being seen by our physicians.       _____________________________________________________________  Should you have questions after your visit to [facility], please contact our office at [phone] between the hours of 8:00 a.m. and 4:30 p.m.  Voicemails left after 4:00 p.m. will not be returned until the following business day.  For prescription refill requests, have your pharmacy contact our office and allow 72 hours.    Cancer Center Support Programs:   > Cancer Support Group  2nd Tuesday of the month 1pm-2pm, Journey Room    

## 2019-02-08 NOTE — Progress Notes (Signed)
Nutrition Assessment  Reason for Assessment: Reinitiation of tube feeding  ASSESSMENT:  63 y/o Graves Disease, HTN,  prostate cancer and most pertinent- laryngeal carcinoma, originally dx in 2015. He received chemoradiation at that time. Recurred in mid 2019-s/p total laryngectomy 9/9. Was gradual weaned off Peg feeds, with tube left in place. Repeat CT in December showed recurrence. Enrolled in hospice, but revoked it when he was deemed candidate for immunotherapy. First Keytruda infusion 2/11.   As detailed in brief note from last week, pt was acutely brought to RD attention d/t family request of restarting tube feeds (he was active with [facility] prior to hospice enrollment). Today is earliest pt could be seen in person. RD had instructed caregiver to infuse ensure/boost through tube until could be seen today.   Today, pt is seen with his long time caregiver, monica. Given pt unable to speak, vast majority of information provided by caregiver and significant other.   Unfortunately, the caregiver (who is reportedly best source of information) was rather impersonal and not very forthcoming with information. It is difficult to determine how much pt is eating based on their report. The meals they say the patient eats sound pretty substantial, but the caregiver says he doesn't eat much at all-citing an apparent hypoglycemic episode he recently had. They seem to disagree on the amount the pt eats.  Similarly, it is difficult to discern barriers to intake. His caregiver had reported pt can eat "whatever he wants" and only the pts appetite was poor, but today the significant other reports it is very painful for patient to eat. Pt does periodically drink Ensure or Boost. Both caregiver and significant other report the patient has no problem at all with liquids and drinks throughout the day. Pt nods when asked if his urine is clear in color.   Reviewing chart, it appears the patient had received Jevity 1.5  following his laryngectomy. Caregiver reports the patient received an unspecified amount of this feeding 4x/day. His PEG has not been utilized in several months. His sig other cleans around it daily, but it has not been flushed since he stopped his tube feeds.  Pt denies n/c/d. He has 1-2 bms/day.   Reviewing charted weight history, pt appears to have had largely maintained his weight between 130-140 lbs up until this past fall, shortly before he underwent his total laryngectomy. He appears to have lost ~20 lbs in the past 8 or so months. He was 138 lbs at this time 1 year ago.   Wt Readings from Last 10 Encounters:  02/08/19 115 lb (52.2 kg)  01/31/19 114 lb 10.2 oz (52 kg)  01/17/19 115 lb 11.2 oz (52.5 kg)  01/11/19 117 lb (53.1 kg)  12/07/18 125 lb (56.7 kg)  11/11/18 123 lb 3.2 oz (55.9 kg)  09/21/18 117 lb 6.4 oz (53.3 kg)  09/14/18 119 lb 9.6 oz (54.3 kg)  09/06/18 112 lb 12.8 oz (51.2 kg)  08/27/18 117 lb 14.4 oz (53.5 kg)   MEDICATIONS:  Chemo: Keytruda q3 weeks Meds: Vit D, Compazine, Miralax, hydrocodone, HCTZ  LABS:  -unremarkable.   Recent Labs  Lab 02/08/19 1112  NA 133*  K 3.9  CL 97*  CO2 26  BUN 10  CREATININE 0.73  CALCIUM 8.9  GLUCOSE 97   ANTHROPOMETRICS: Height:  Ht Readings from Last 1 Encounters:  01/31/19 6' (1.829 m)   Weight:  Wt Readings from Last 1 Encounters:  02/08/19 115 lb (52.2 kg)   BMI:  BMI Readings  from Last 1 Encounters:  02/08/19 15.60 kg/m   IBW:  80.91 kg Weight Changes: -20 lbs x 8 months.  ESTIMATED ENERGY NEEDS:  Kcal: 1750-2000 kcal (34-38 kcal/kg bw) Protein: 78-94 (1.5-1.8g/kg bw)  Fluid: 1.6-1.8 L fluid ( 30-79ml/kcal)  NUTRITION - FOCUSED PHYSICAL EXAM: Moderate muscle/fat loss  NUTRITION DIAGNOSIS:  Moderate malnutrition based on moderate fat/muscle wasting and significant wt loss of ~13.5% x 8 months  DOCUMENTATION CODES:  Moderate Malnutrition- Chronic context  INTERVENTION:  Pt still receives  pleasure from oral intake. He would like to have some appetite to eat.   However given, his report of hypoglycemia d/t inadequate intake and equivocal diet recall, RD felt best to meet vast majority of his needs w/ TF and then titrate it back later if he feels he is too full too eat. Sig other, caregiver and pt in agreement.   Initial TF regimen:   1 can Osmolite 1.5 bolus TID. Flush w/  60 cc before and after.  This provides 1420 kcals, 60g Pro, 724 mls fluid (+480 from flushes)  Because the TF regimen will not meet 100% of needs, RD noted he will need to eat atleast a meal or consume 1-2 Ensure/day. RD provided case of Ensure, samples of Boost/CIB and coupons.   Given his tube has not been flushed in several months, RD is slightly concerned it will no longer be patent. Nursing said they could try to flush it today while he gets his infusion.    Pt/caregiver well versed in TF administration given he used it for several months in past. They report no questions/concerns about home TF administration. RD gave them educational handout on gravity feeds in case they would like a refresher.   RD left his contact information in case issues were encountered.   GOAL:  Oral/TF intake to meet >90% of estimated needs, weight stability/gain  MONITOR:  TF tolerance, oral intake, weight trends, tolerance to tx, Goals of Care, disease progression, labs  Next Visit: 3 weeks. -next chemo infusion  Burtis Junes RD, LDN, CNSC Clinical Nutrition Available Tues-Sat via Pager: [phone] 02/08/2019 11:47 AM

## 2019-02-08 NOTE — Progress Notes (Signed)
He is ok for treatment labs reviewed.

## 2019-02-09 ENCOUNTER — Other Ambulatory Visit (HOSPITAL_COMMUNITY): Payer: Self-pay | Admitting: Nurse Practitioner

## 2019-02-09 ENCOUNTER — Telehealth (HOSPITAL_COMMUNITY): Payer: Self-pay

## 2019-02-09 DIAGNOSIS — C329 Malignant neoplasm of larynx, unspecified: Secondary | ICD-10-CM

## 2019-02-09 NOTE — Telephone Encounter (Signed)
Spoke with the patients family. Patient was resting and having no complaints today. Denied rash, fevers, chill, or diarrhea.  Denied pain.   Instructed to call for any questions with understanding verbalized.

## 2019-02-10 ENCOUNTER — Other Ambulatory Visit (HOSPITAL_COMMUNITY): Payer: Self-pay | Admitting: Nurse Practitioner

## 2019-02-10 DIAGNOSIS — E559 Vitamin D deficiency, unspecified: Secondary | ICD-10-CM | POA: Diagnosis not present

## 2019-02-10 DIAGNOSIS — R1310 Dysphagia, unspecified: Secondary | ICD-10-CM | POA: Diagnosis not present

## 2019-02-10 DIAGNOSIS — E43 Unspecified severe protein-calorie malnutrition: Secondary | ICD-10-CM | POA: Diagnosis not present

## 2019-02-10 DIAGNOSIS — Z930 Tracheostomy status: Secondary | ICD-10-CM | POA: Diagnosis not present

## 2019-02-10 DIAGNOSIS — C760 Malignant neoplasm of head, face and neck: Secondary | ICD-10-CM | POA: Diagnosis not present

## 2019-02-10 DIAGNOSIS — C329 Malignant neoplasm of larynx, unspecified: Secondary | ICD-10-CM | POA: Diagnosis not present

## 2019-02-11 DIAGNOSIS — E039 Hypothyroidism, unspecified: Secondary | ICD-10-CM | POA: Diagnosis not present

## 2019-02-11 DIAGNOSIS — H547 Unspecified visual loss: Secondary | ICD-10-CM | POA: Diagnosis not present

## 2019-02-11 DIAGNOSIS — C140 Malignant neoplasm of pharynx, unspecified: Secondary | ICD-10-CM | POA: Diagnosis not present

## 2019-02-11 DIAGNOSIS — C4442 Squamous cell carcinoma of skin of scalp and neck: Secondary | ICD-10-CM | POA: Diagnosis not present

## 2019-02-11 DIAGNOSIS — Z8546 Personal history of malignant neoplasm of prostate: Secondary | ICD-10-CM | POA: Diagnosis not present

## 2019-02-11 DIAGNOSIS — Z87891 Personal history of nicotine dependence: Secondary | ICD-10-CM | POA: Diagnosis not present

## 2019-02-11 DIAGNOSIS — Z431 Encounter for attention to gastrostomy: Secondary | ICD-10-CM | POA: Diagnosis not present

## 2019-02-11 DIAGNOSIS — C329 Malignant neoplasm of larynx, unspecified: Secondary | ICD-10-CM | POA: Diagnosis not present

## 2019-02-11 DIAGNOSIS — Z79891 Long term (current) use of opiate analgesic: Secondary | ICD-10-CM | POA: Diagnosis not present

## 2019-02-11 DIAGNOSIS — I10 Essential (primary) hypertension: Secondary | ICD-10-CM | POA: Diagnosis not present

## 2019-02-11 DIAGNOSIS — Z483 Aftercare following surgery for neoplasm: Secondary | ICD-10-CM | POA: Diagnosis not present

## 2019-02-11 DIAGNOSIS — M1990 Unspecified osteoarthritis, unspecified site: Secondary | ICD-10-CM | POA: Diagnosis not present

## 2019-02-11 DIAGNOSIS — Z9181 History of falling: Secondary | ICD-10-CM | POA: Diagnosis not present

## 2019-02-13 ENCOUNTER — Emergency Department (HOSPITAL_COMMUNITY)
Admission: EM | Admit: 2019-02-13 | Discharge: 2019-02-13 | Disposition: A | Discharge status: Home / Self Care | Payer: Medicare HMO | Attending: Emergency Medicine | Admitting: Emergency Medicine

## 2019-02-13 ENCOUNTER — Encounter (HOSPITAL_COMMUNITY): Payer: Self-pay | Admitting: Emergency Medicine

## 2019-02-13 ENCOUNTER — Other Ambulatory Visit: Payer: Self-pay

## 2019-02-13 DIAGNOSIS — Z79899 Other long term (current) drug therapy: Secondary | ICD-10-CM | POA: Insufficient documentation

## 2019-02-13 DIAGNOSIS — K9420 Gastrostomy complication, unspecified: Secondary | ICD-10-CM

## 2019-02-13 DIAGNOSIS — Z85819 Personal history of malignant neoplasm of unspecified site of lip, oral cavity, and pharynx: Secondary | ICD-10-CM | POA: Insufficient documentation

## 2019-02-13 DIAGNOSIS — K9423 Gastrostomy malfunction: Secondary | ICD-10-CM | POA: Diagnosis not present

## 2019-02-13 DIAGNOSIS — Z8546 Personal history of malignant neoplasm of prostate: Secondary | ICD-10-CM | POA: Insufficient documentation

## 2019-02-13 DIAGNOSIS — I10 Essential (primary) hypertension: Secondary | ICD-10-CM | POA: Insufficient documentation

## 2019-02-13 DIAGNOSIS — E039 Hypothyroidism, unspecified: Secondary | ICD-10-CM | POA: Insufficient documentation

## 2019-02-13 DIAGNOSIS — Z66 Do not resuscitate: Secondary | ICD-10-CM | POA: Insufficient documentation

## 2019-02-13 DIAGNOSIS — Z859 Personal history of malignant neoplasm, unspecified: Secondary | ICD-10-CM | POA: Insufficient documentation

## 2019-02-13 DIAGNOSIS — Z87891 Personal history of nicotine dependence: Secondary | ICD-10-CM | POA: Insufficient documentation

## 2019-02-13 NOTE — ED Provider Notes (Signed)
[facility] EMERGENCY DEPARTMENT Provider Note   CSN: [id number] Arrival date & time: 02/13/19  1522     History   Chief Complaint Chief Complaint  Patient presents with  . other    peg tube clogged    HPI Brandon Hall is a 63 y.o. male.  HPI  The patient is a 63 year old male, he has a known history of radiation therapy and surgical resection of his head and neck cancer.  He had a tracheostomy and a gastric tube that was placed back in the summer, both the tracheostomy was removed and the G-tube was discontinued to its use back in the summer however it stayed in place.  Because of ongoing weight loss his home health company has contacted his doctor and they want to restart feeds to increase his nutrition.  They tried to use the G-tube this evening and it would not work, they came in to have it declogged.  It has not been used in several months.  Past Medical History:  Diagnosis Date  . Arthritis   . Blindness of right eye   . Cancer ([place])    pharyngeal ca  . History of radiation therapy 11/2014   TxN3MO squamous cell carcinoma of L neck, Stage IVB, unknown primary 70 Gy in 35 fractions treated at [facility], finished 12/25/14  . History of shingles    RASH  02-03-2014  PER PCP RESOLVING  . Hypertension    NO MEDS ,  MONITORED BY PCP  . Hypothyroidism, postradioiodine therapy   . Prostate cancer ([place]) 07/12/2014   DX  SEVERAL  YRS   AGO  WITH RADIATION THERAPY  . Squamous cell carcinoma of head and neck ([place]) 07/12/2014  . 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 Goals of care, counseling/discussion 01/31/2019  . DNR (do not resuscitate) 01/11/2019  . Cachexia ([place]) 01/11/2019  . Lung nodule 01/11/2019  . Status post laryngectomy 09/06/2018  . Protein-calorie malnutrition, severe 08/20/2018  . Laryngeal cancer ([place]) 08/19/2018  . Perianal abscess 08/10/2017  . Vitamin D deficiency 02/08/2015    . Squamous cell carcinoma of head and neck ([place]) 07/12/2014  . History of prostate cancer 07/12/2014  . Shingles rash 02/03/2014  . Abnormal transaminases 10/02/2013  . Hypothyroid 10/02/2013  . Blind right eye 09/30/2013  . Dyslipidemia 09/30/2013  . Hypertension   . Graves disease     Past Surgical History:  Procedure Laterality Date  . COLONOSCOPY  10/20/2011   Procedure: COLONOSCOPY;  Surgeon: Daneil Dolin, MD;  [facility];  Service: Endoscopy;  Laterality: N/A;  1:00 pm  . COLONOSCOPY N/A 02/03/2018   Procedure: COLONOSCOPY;  Surgeon: Daneil Dolin, MD;  [facility];  Service: Endoscopy;  Laterality: N/A;  2:15  . CRYOABLATION N/A 04/07/2014   Procedure: SALVAGE CRYO ABLATION PROSTATE;  Surgeon: Lowella Bandy, MD;  Location: [facility];  Service: Urology;  Laterality: N/A;  . DIRECT LARYNGOSCOPY N/A 09/06/2018   Procedure: DIRECT LARYNGOSCOPY biopsy with frozen section;  Surgeon: Izora Gala, MD;  Location: [place];  Service: ENT;  Laterality: N/A;  . IR GASTROSTOMY TUBE MOD SED  08/20/2018  . IR GENERIC HISTORICAL  03/27/2017   IR REMOVAL TUN ACCESS W/ PORT W/O FL MOD SED [facility]-INTER[facility] RAD  . LARYNGETOMY N/A 09/06/2018   Procedure: total LARYNGECTOMY;  Surgeon: Izora Gala, MD;  Location: [place];  Service: ENT;  Laterality:  N/A;  . LARYNGOSCOPY    . LYMPH NODE BIOPSY    . RADICAL NECK DISSECTION Right 09/06/2018   Procedure: Modified NECK DISSECTION;  Surgeon: Izora Gala, MD;  Location: [place];  Service: ENT;  Laterality: Right;        Home Medications    Prior to Admission medications   Medication Sig Start Date End Date Taking? Authorizing Provider  hydrochlorothiazide (HYDRODIURIL) 12.5 MG tablet Take 1 tablet (12.5 mg total) by mouth daily. 11/11/18   Dettinger, Fransisca Kaufmann, MD  HYDROcodone-acetaminophen (HYCET) 7.5-325 mg/15 ml solution TAKE (2) TEASPOONFULS EVERY FOUR HOURS AS NEED- ED. 02/11/19   Glennie Isle, NP-C  levothyroxine  (SYNTHROID, LEVOTHROID) 100 MCG tablet Place 1 tablet (100 mcg total) into feeding tube daily before breakfast. Patient taking differently: Take 100 mcg by mouth daily before breakfast.  11/11/18   Dettinger, Fransisca Kaufmann, MD  lidocaine-prilocaine (EMLA) cream Apply to affected area once 02/07/19   Derek Jack, MD  Pembrolizumab [facility] IV) Inject into the vein every 21 ( twenty-one) days.    [provider]  phenol (CHLORASEPTIC) 1.4 % LIQD Use as directed 1 spray in the mouth or throat as needed for throat irritation / pain.    [provider]  polyethylene glycol (MIRALAX / GLYCOLAX) packet Take 17 g by mouth daily.     [provider]  pravastatin (PRAVACHOL) 40 MG tablet Take 1 tablet (40 mg total) by mouth daily. 11/11/18   Dettinger, Fransisca Kaufmann, MD  prochlorperazine (COMPAZINE) 10 MG tablet Take 1 tablet (10 mg total) by mouth every 6 (six) hours as needed (Nausea or vomiting). 02/07/19   Derek Jack, MD  Vitamin D, Ergocalciferol, (DRISDOL) 1.25 MG (50000 UT) CAPS capsule TAKE 1 CAPSULE EVERY 7 DAYS. 12/03/18   Dettinger, Fransisca Kaufmann, MD    Family History Family History  Problem Relation Age of Onset  . Colon cancer Father   . Hypertension Mother   . Colon cancer Brother   . Colon cancer Brother     Social History Social History   Tobacco Use  . Smoking status: Former Smoker    Packs/day: 1.00    Years: 40.00    Pack years: 40.00    Types: Cigarettes    Last attempt to quit: 09/29/2013    Years since quitting: 5.3  . Smokeless tobacco: Never Used  Substance Use Topics  . Alcohol use: No  . Drug use: No     Allergies   Patient has no known allergies.   Review of Systems Review of Systems  Constitutional: Negative for fever.  Gastrointestinal: Negative for vomiting.     Physical Exam Updated Vital Signs BP 122/74 (BP Location: Right Arm)   Pulse 85   Temp 98.3 F (36.8 C) (Tympanic)   Resp 18   Ht 1.854 m (6\' 1" )   Wt  53.5 kg   SpO2 99%   BMI 15.57 kg/m   Physical Exam Vitals signs and nursing note reviewed.  Constitutional:      Appearance: He is well-developed. He is not diaphoretic.  HENT:     Head: Normocephalic and atraumatic.  Eyes:     General:        Right eye: No discharge.        Left eye: No discharge.     Conjunctiva/sclera: Conjunctivae normal.  Pulmonary:     Effort: Pulmonary effort is normal. No respiratory distress.  Abdominal:     Comments: The patient is slightly cachectic, the  G-tube is in place, there are some concretions inside the tubing in the middle  Skin:    General: Skin is warm and dry.     Findings: No erythema or rash.  Neurological:     Mental Status: He is alert.     Coordination: Coordination normal.      ED Treatments / Results  Labs (all labs ordered are listed, but only abnormal results are displayed) Labs Reviewed - No data to display  EKG None  Radiology No results found.  Procedures Procedures (including critical care time)  Medications Ordered in ED Medications - No data to display   Initial Impression / Assessment and Plan / ED Course  I have reviewed the triage vital signs and the nursing notes.  Pertinent labs & imaging results that were available during my care of the patient were reviewed by me and considered in my medical decision making (see chart for details).     The tube is located in the right place, there is no significant bleeding or discharge, he has no discomfort.  Because he wanted to use the same tube and the family was uncomfortable using a new tube I elected to cut out the middle piece of tubing that had the concretion, I was able to replace the distal port on the proximal tubing and reconnected as per its normal self just shorter.  The family is comfortable with this, we flushed it and it works just fine.  The patient is stable for discharge  Final Clinical Impressions(s) / ED Diagnoses   Final diagnoses:    Problem with gastrostomy tube [facility])    ED Discharge Orders    None       Noemi Chapel, MD 02/13/19 2118

## 2019-02-13 NOTE — ED Triage Notes (Signed)
Pt's states he need his feeding tube unclogged. Last use was in November

## 2019-02-13 NOTE — Discharge Instructions (Signed)
Please work with your home health to continue tube feeds at home.  This tube should be working the same way that it used to, it is just shorter.  You may return to the emergency department if you develop increasing pain fever or vomiting.  Please be aware that you will likely have some bleeding at the site of the tube because of the manipulation that we had to do today.  Keep a dressing around it to prevent ongoing bleeding

## 2019-02-14 DIAGNOSIS — C4442 Squamous cell carcinoma of skin of scalp and neck: Secondary | ICD-10-CM | POA: Diagnosis not present

## 2019-02-14 DIAGNOSIS — E039 Hypothyroidism, unspecified: Secondary | ICD-10-CM | POA: Diagnosis not present

## 2019-02-14 DIAGNOSIS — I10 Essential (primary) hypertension: Secondary | ICD-10-CM | POA: Diagnosis not present

## 2019-02-14 DIAGNOSIS — Z483 Aftercare following surgery for neoplasm: Secondary | ICD-10-CM | POA: Diagnosis not present

## 2019-02-14 DIAGNOSIS — C140 Malignant neoplasm of pharynx, unspecified: Secondary | ICD-10-CM | POA: Diagnosis not present

## 2019-02-14 DIAGNOSIS — H547 Unspecified visual loss: Secondary | ICD-10-CM | POA: Diagnosis not present

## 2019-02-14 DIAGNOSIS — M1990 Unspecified osteoarthritis, unspecified site: Secondary | ICD-10-CM | POA: Diagnosis not present

## 2019-02-14 DIAGNOSIS — C329 Malignant neoplasm of larynx, unspecified: Secondary | ICD-10-CM | POA: Diagnosis not present

## 2019-02-14 DIAGNOSIS — Z431 Encounter for attention to gastrostomy: Secondary | ICD-10-CM | POA: Diagnosis not present

## 2019-02-15 DIAGNOSIS — C61 Malignant neoplasm of prostate: Secondary | ICD-10-CM | POA: Diagnosis not present

## 2019-02-16 ENCOUNTER — Other Ambulatory Visit: Payer: Self-pay

## 2019-02-16 ENCOUNTER — Emergency Department (HOSPITAL_COMMUNITY): Payer: Medicare HMO

## 2019-02-16 ENCOUNTER — Emergency Department (HOSPITAL_COMMUNITY)
Admission: EM | Admit: 2019-02-16 | Discharge: 2019-02-16 | Disposition: A | Discharge status: Home / Self Care | Payer: Medicare HMO | Attending: Emergency Medicine | Admitting: Emergency Medicine

## 2019-02-16 ENCOUNTER — Other Ambulatory Visit (HOSPITAL_COMMUNITY): Payer: Self-pay | Admitting: Nurse Practitioner

## 2019-02-16 ENCOUNTER — Ambulatory Visit (INDEPENDENT_AMBULATORY_CARE_PROVIDER_SITE_OTHER): Payer: Medicare HMO

## 2019-02-16 DIAGNOSIS — Z431 Encounter for attention to gastrostomy: Secondary | ICD-10-CM

## 2019-02-16 DIAGNOSIS — E039 Hypothyroidism, unspecified: Secondary | ICD-10-CM | POA: Insufficient documentation

## 2019-02-16 DIAGNOSIS — C140 Malignant neoplasm of pharynx, unspecified: Secondary | ICD-10-CM

## 2019-02-16 DIAGNOSIS — D0008 Carcinoma in situ of pharynx: Secondary | ICD-10-CM | POA: Diagnosis not present

## 2019-02-16 DIAGNOSIS — Z7902 Long term (current) use of antithrombotics/antiplatelets: Secondary | ICD-10-CM | POA: Insufficient documentation

## 2019-02-16 DIAGNOSIS — C329 Malignant neoplasm of larynx, unspecified: Secondary | ICD-10-CM

## 2019-02-16 DIAGNOSIS — R569 Unspecified convulsions: Secondary | ICD-10-CM | POA: Diagnosis not present

## 2019-02-16 DIAGNOSIS — Z87891 Personal history of nicotine dependence: Secondary | ICD-10-CM | POA: Insufficient documentation

## 2019-02-16 DIAGNOSIS — R0689 Other abnormalities of breathing: Secondary | ICD-10-CM | POA: Diagnosis not present

## 2019-02-16 DIAGNOSIS — I959 Hypotension, unspecified: Secondary | ICD-10-CM | POA: Diagnosis not present

## 2019-02-16 DIAGNOSIS — M1990 Unspecified osteoarthritis, unspecified site: Secondary | ICD-10-CM

## 2019-02-16 DIAGNOSIS — R4020 Unspecified coma: Secondary | ICD-10-CM | POA: Diagnosis not present

## 2019-02-16 DIAGNOSIS — Z79899 Other long term (current) drug therapy: Secondary | ICD-10-CM | POA: Insufficient documentation

## 2019-02-16 DIAGNOSIS — Z8546 Personal history of malignant neoplasm of prostate: Secondary | ICD-10-CM | POA: Insufficient documentation

## 2019-02-16 DIAGNOSIS — I10 Essential (primary) hypertension: Secondary | ICD-10-CM

## 2019-02-16 DIAGNOSIS — C4442 Squamous cell carcinoma of skin of scalp and neck: Secondary | ICD-10-CM | POA: Diagnosis not present

## 2019-02-16 DIAGNOSIS — Z483 Aftercare following surgery for neoplasm: Secondary | ICD-10-CM

## 2019-02-16 DIAGNOSIS — Z79891 Long term (current) use of opiate analgesic: Secondary | ICD-10-CM

## 2019-02-16 DIAGNOSIS — Z85828 Personal history of other malignant neoplasm of skin: Secondary | ICD-10-CM | POA: Insufficient documentation

## 2019-02-16 DIAGNOSIS — R404 Transient alteration of awareness: Secondary | ICD-10-CM | POA: Diagnosis not present

## 2019-02-16 DIAGNOSIS — H547 Unspecified visual loss: Secondary | ICD-10-CM

## 2019-02-16 LAB — CBC WITH DIFFERENTIAL/PLATELET
Abs Immature Granulocytes: 0.03 10*3/uL (ref 0.00–0.07)
Basophils Absolute: 0 10*3/uL (ref 0.0–0.1)
Basophils Relative: 0 %
Eosinophils Absolute: 0.2 10*3/uL (ref 0.0–0.5)
Eosinophils Relative: 4 %
HCT: 45.8 % (ref 39.0–52.0)
Hemoglobin: 13.5 g/dL (ref 13.0–17.0)
Immature Granulocytes: 1 %
Lymphocytes Relative: 6 %
Lymphs Abs: 0.3 10*3/uL — ABNORMAL LOW (ref 0.7–4.0)
MCH: 24.5 pg — AB (ref 26.0–34.0)
MCHC: 29.5 g/dL — ABNORMAL LOW (ref 30.0–36.0)
MCV: 83.3 fL (ref 80.0–100.0)
Monocytes Absolute: 0.3 10*3/uL (ref 0.1–1.0)
Monocytes Relative: 5 %
Neutro Abs: 4.9 10*3/uL (ref 1.7–7.7)
Neutrophils Relative %: 84 %
Platelets: 252 10*3/uL (ref 150–400)
RBC: 5.5 MIL/uL (ref 4.22–5.81)
RDW: 18 % — ABNORMAL HIGH (ref 11.5–15.5)
WBC: 5.8 10*3/uL (ref 4.0–10.5)
nRBC: 0 % (ref 0.0–0.2)

## 2019-02-16 LAB — COMPREHENSIVE METABOLIC PANEL
ALT: 24 U/L (ref 0–44)
AST: 33 U/L (ref 15–41)
Albumin: 4.1 g/dL (ref 3.5–5.0)
Alkaline Phosphatase: 73 U/L (ref 38–126)
Anion gap: 13 (ref 5–15)
BUN: 11 mg/dL (ref 8–23)
CO2: 28 mmol/L (ref 22–32)
Calcium: 9.4 mg/dL (ref 8.9–10.3)
Chloride: 93 mmol/L — ABNORMAL LOW (ref 98–111)
Creatinine, Ser: 0.62 mg/dL (ref 0.61–1.24)
GFR calc Af Amer: >60 mL/min (ref >60)
GFR calc non Af Amer: >60 mL/min (ref >60)
Glucose, Bld: 125 mg/dL — ABNORMAL HIGH (ref 70–99)
POTASSIUM: 3.5 mmol/L (ref 3.5–5.1)
Sodium: 134 mmol/L — ABNORMAL LOW (ref 135–145)
Total Bilirubin: 0.4 mg/dL (ref 0.3–1.2)
Total Protein: 8 g/dL (ref 6.5–8.1)

## 2019-02-16 LAB — LIPASE, BLOOD: LIPASE: 21 U/L (ref 11–51)

## 2019-02-16 LAB — URINALYSIS, ROUTINE W REFLEX MICROSCOPIC
Bilirubin Urine: NEGATIVE
Glucose, UA: NEGATIVE mg/dL
Hgb urine dipstick: NEGATIVE
KETONES UR: NEGATIVE mg/dL
Leukocytes,Ua: NEGATIVE
Nitrite: NEGATIVE
PH: 8 (ref 5.0–8.0)
Protein, ur: NEGATIVE mg/dL
Specific Gravity, Urine: 1.005 (ref 1.005–1.030)

## 2019-02-16 LAB — MAGNESIUM: Magnesium: 2.3 mg/dL (ref 1.7–2.4)

## 2019-02-16 MED ORDER — SODIUM CHLORIDE 0.9 % IV SOLN
INTRAVENOUS | Status: DC
  Administered 2019-02-16: 20:00:00 via INTRAVENOUS

## 2019-02-16 NOTE — ED Triage Notes (Signed)
Patient spouse called EMS due to patient having seizure like activity stating he had "4 seizures over 45 minutes".   Versed 2.5 mg IV en route.  EMS states improved symptoms. No history of seizures per spouse.  Patient present to ED, non verbal, incontinent with eyes closed.  Feeding tube noted.

## 2019-02-16 NOTE — ED Provider Notes (Signed)
[facility] EMERGENCY DEPARTMENT Provider Note   CSN: [id number] Arrival date & time: 02/16/19  1530    History   Chief Complaint Chief Complaint  Patient presents with  . Seizures    HPI Brandon Hall is a 63 y.o. male.     Patient brought in by EMS for brief seizure-like activity.  Patient room air sats were 98%.  Patient has a open tracheostomy.  Without the tube following head neck cancer in the past of followed by hematology oncology upstairs.  Getting some immunotherapy due to recurrence of the head neck cancer.  Patient was seen in the emergency department on February 16 this was for a gastrostomy tube problem.  Which got corrected.  But was seen on February 2 going in the February 3 for loss of consciousness basically similar activity for what happened today.  Patient now completely asymptomatic.  Patient is blind in his right eye.  Patient without any complaints currently.  His wife witnessed the event patient was sitting in a chair said he had some brief shaking that only lasted seconds.  Seemed as if maybe he was briefly unconscious.  Patient is normally fairly active around the home and does do chores.     Past Medical History:  Diagnosis Date  . Arthritis   . Blindness of right eye   . Cancer ([place])    pharyngeal ca  . History of radiation therapy 11/2014   TxN3MO squamous cell carcinoma of L neck, Stage IVB, unknown primary 70 Gy in 35 fractions treated at [facility], finished 12/25/14  . History of shingles    RASH  02-03-2014  PER PCP RESOLVING  . Hypertension    NO MEDS ,  MONITORED BY PCP  . Hypothyroidism, postradioiodine therapy   . Prostate cancer ([place]) 07/12/2014   DX  SEVERAL  YRS   AGO  WITH RADIATION THERAPY  . Squamous cell carcinoma of head and neck ([place]) 07/12/2014  . 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 Goals of care, counseling/discussion 01/31/2019  . DNR (do not  resuscitate) 01/11/2019  . Cachexia ([place]) 01/11/2019  . Lung nodule 01/11/2019  . Status post laryngectomy 09/06/2018  . Protein-calorie malnutrition, severe 08/20/2018  . Laryngeal cancer ([place]) 08/19/2018  . Perianal abscess 08/10/2017  . Vitamin D deficiency 02/08/2015  . Squamous cell carcinoma of head and neck ([place]) 07/12/2014  . History of prostate cancer 07/12/2014  . Shingles rash 02/03/2014  . Abnormal transaminases 10/02/2013  . Hypothyroid 10/02/2013  . Blind right eye 09/30/2013  . Dyslipidemia 09/30/2013  . Hypertension   . Graves disease     Past Surgical History:  Procedure Laterality Date  . COLONOSCOPY  10/20/2011   Procedure: COLONOSCOPY;  Surgeon: Daneil Dolin, MD;  [facility];  Service: Endoscopy;  Laterality: N/A;  1:00 pm  . COLONOSCOPY N/A 02/03/2018   Procedure: COLONOSCOPY;  Surgeon: Daneil Dolin, MD;  [facility];  Service: Endoscopy;  Laterality: N/A;  2:15  . CRYOABLATION N/A 04/07/2014   Procedure: SALVAGE CRYO ABLATION PROSTATE;  Surgeon: Lowella Bandy, MD;  Location: [facility];  Service: Urology;  Laterality: N/A;  . DIRECT LARYNGOSCOPY N/A 09/06/2018   Procedure: DIRECT LARYNGOSCOPY biopsy with frozen section;  Surgeon: Izora Gala, MD;  Location: [place];  Service: ENT;  Laterality: N/A;  . IR GASTROSTOMY TUBE MOD SED  08/20/2018  . IR GENERIC HISTORICAL  03/27/2017   IR REMOVAL TUN ACCESS W/ PORT W/O FL MOD SED [facility]-INTER[facility] RAD  . LARYNGETOMY N/A 09/06/2018   Procedure: total LARYNGECTOMY;  Surgeon: Izora Gala, MD;  Location: [place];  Service: ENT;  Laterality: N/A;  . LARYNGOSCOPY    . LYMPH NODE BIOPSY    . RADICAL NECK DISSECTION Right 09/06/2018   Procedure: Modified NECK DISSECTION;  Surgeon: Izora Gala, MD;  Location: [place];  Service: ENT;  Laterality: Right;        Home Medications    Prior to Admission medications   Medication Sig Start Date End Date Taking? Authorizing Provider  hydrochlorothiazide  (HYDRODIURIL) 12.5 MG tablet Take 1 tablet (12.5 mg total) by mouth daily. 11/11/18  Yes Dettinger, Fransisca Kaufmann, MD  HYDROcodone-acetaminophen (HYCET) 7.5-325 mg/15 ml solution TAKE (2) TEASPOONFULS EVERY FOUR HOURS AS NEED- ED. Patient taking differently: Take 10 mLs by mouth every 4 (four) hours as needed for moderate pain or severe pain.  02/16/19  Yes Lockamy, Randi L, NP-C  levothyroxine (SYNTHROID, LEVOTHROID) 100 MCG tablet Place 1 tablet (100 mcg total) into feeding tube daily before breakfast. Patient taking differently: Take 100 mcg by mouth daily before breakfast.  11/11/18  Yes Dettinger, Fransisca Kaufmann, MD  Pembrolizumab (KEYTRUDA IV) Inject into the vein every 21 ( twenty-one) days.   Yes [provider]  polyethylene glycol (MIRALAX / GLYCOLAX) packet Take 17 g by mouth daily as needed for mild constipation or moderate constipation.    Yes [provider]  pravastatin (PRAVACHOL) 40 MG tablet Take 1 tablet (40 mg total) by mouth daily. 11/11/18  Yes Dettinger, Fransisca Kaufmann, MD  prochlorperazine (COMPAZINE) 10 MG tablet Take 1 tablet (10 mg total) by mouth every 6 (six) hours as needed (Nausea or vomiting). 02/07/19  Yes Derek Jack, MD  Vitamin D, Ergocalciferol, (DRISDOL) 1.25 MG (50000 UT) CAPS capsule TAKE 1 CAPSULE EVERY 7 DAYS. Patient taking differently: Take 50,000 Units by mouth every Sunday. TAKE 1 CAPSULE EVERY 7 DAYS. 12/03/18  Yes Dettinger, Fransisca Kaufmann, MD    Family History Family History  Problem Relation Age of Onset  . Colon cancer Father   . Hypertension Mother   . Colon cancer Brother   . Colon cancer Brother     Social History Social History   Tobacco Use  . Smoking status: Former Smoker    Packs/day: 1.00    Years: 40.00    Pack years: 40.00    Types: Cigarettes    Last attempt to quit: 09/29/2013    Years since quitting: 5.3  . Smokeless tobacco: Never Used  Substance Use Topics  . Alcohol use: No  . Drug use: No     Allergies     Patient has no known allergies.   Review of Systems Review of Systems  Constitutional: Negative for chills and fever.  HENT: Negative for congestion, rhinorrhea and sore throat.   Eyes: Positive for visual disturbance.  Respiratory: Negative for cough and shortness of breath.   Cardiovascular: Negative for chest pain and leg swelling.  Gastrointestinal: Negative for abdominal pain, diarrhea, nausea and vomiting.  Genitourinary: Negative for dysuria.  Musculoskeletal: Negative for back pain and neck pain.  Skin: Negative for rash.  Neurological: Positive for seizures. Negative for dizziness, light-headedness and headaches.  Hematological: Does not bruise/bleed easily.  Psychiatric/Behavioral: Negative for confusion.     Physical Exam Updated Vital Signs BP (!) 164/90 (BP Location: Left Arm)   Pulse 72   Temp 98.7 F (37.1  C) (Oral)   Resp 15   Ht 1.778 m (5\' 10" )   Wt 72.6 kg   SpO2 98%   BMI 22.96 kg/m   Physical Exam Vitals signs and nursing note reviewed.  Constitutional:      Appearance: He is well-developed.  HENT:     Head: Normocephalic and atraumatic.     Mouth/Throat:     Mouth: Mucous membranes are moist.  Eyes:     Extraocular Movements: Extraocular movements intact.     Conjunctiva/sclera: Conjunctivae normal.     Pupils: Pupils are equal, round, and reactive to light.  Neck:     Musculoskeletal: Normal range of motion and neck supple. No neck rigidity.     Comments: Open tracheostomy well-healed.  No tube apparently has had not had a tube for a long period of time. Cardiovascular:     Rate and Rhythm: Normal rate and regular rhythm.     Heart sounds: Normal heart sounds. No murmur.  Pulmonary:     Effort: Pulmonary effort is normal. No respiratory distress.     Breath sounds: Normal breath sounds.  Abdominal:     General: Bowel sounds are normal.     Palpations: Abdomen is soft.     Tenderness: There is no abdominal tenderness.  Musculoskeletal:  Normal range of motion.        General: No swelling.  Skin:    General: Skin is warm and dry.  Neurological:     General: No focal deficit present.     Mental Status: He is alert and oriented to person, place, and time.     Motor: No weakness.      ED Treatments / Results  Labs (all labs ordered are listed, but only abnormal results are displayed) Labs Reviewed  COMPREHENSIVE METABOLIC PANEL - Abnormal; Notable for the following components:      Result Value   Sodium 134 (*)    Chloride 93 (*)    Glucose, Bld 125 (*)    All other components within normal limits  CBC WITH DIFFERENTIAL/PLATELET - Abnormal; Notable for the following components:   MCH 24.5 (*)    MCHC 29.5 (*)    RDW 18.0 (*)    Lymphs Abs 0.3 (*)    All other components within normal limits  URINALYSIS, ROUTINE W REFLEX MICROSCOPIC - Abnormal; Notable for the following components:   Color, Urine STRAW (*)    All other components within normal limits  LIPASE, BLOOD  MAGNESIUM    EKG EKG Interpretation  Date/Time:  Wednesday February 16 2019 18:39:49 EST Ventricular Rate:  76 PR Interval:    QRS Duration: 112 QT Interval:  478 QTC Calculation: 538 R Axis:   38 Text Interpretation:  Sinus rhythm Right atrial enlargement Borderline intraventricular conduction delay ST elevation, consider inferior injury Prolonged QT interval Artifact Confirmed by Fredia Sorrow [phone]) on 02/16/2019 6:55:30 PM   Radiology Dg Chest 2 View  Result Date: 02/16/2019 CLINICAL DATA:  Seizure-like activity over 45 minutes. EXAM: CHEST - 2 VIEW COMPARISON:  Chest CT 06/10/2018 FINDINGS: The heart size and mediastinal contours are within normal limits. Mild uncoiling of the normal caliber thoracic aorta with atherosclerosis noted. Lungs are clear. Osteoarthritis of the included acromioclavicular and glenohumeral joints. IMPRESSION: No active cardiopulmonary disease. Electronically Signed   By: Ashley Royalty M.D.   On: 02/16/2019  19:10   Ct Head Wo Contrast  Result Date: 02/16/2019 CLINICAL DATA:  Seizure.  History of head neck cancer. EXAM:  CT HEAD WITHOUT CONTRAST TECHNIQUE: Contiguous axial images were obtained from the base of the skull through the vertex without intravenous contrast. COMPARISON:  MRI 07/21/2014. FINDINGS: Brain: 1.3 cm calcified or hemorrhagic lesion identified inferior right cerebellum. No substantial surrounding edema. There is no evidence for acute hemorrhage, hydrocephalus, mass lesion, or abnormal extra-axial fluid collection. No definite CT evidence for acute infarction. Diffuse loss of parenchymal volume is consistent with atrophy. Insert wider disease Vascular: No hyperdense vessel or unexpected calcification. Skull: No evidence for fracture. No worrisome lytic or sclerotic lesion. Sinuses/Orbits: The visualized paranasal sinuses and mastoid air cells are clear. Globe prosthesis noted on the right. Other: None. IMPRESSION: 1. No acute intracranial abnormality. 2. 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 3. Atrophy with chronic small vessel white matter ischemic disease. Electronically Signed   By: Misty Stanley M.D.   On: 02/16/2019 19:08    Procedures Procedures (including critical care time)  Medications Ordered in ED Medications  0.9 %  sodium chloride infusion ( Intravenous New Bag/Given 02/16/19 1933)     Initial Impression / Assessment and Plan / ED Course  I have reviewed the triage vital signs and the nursing notes.  Pertinent labs & imaging results that were available during my care of the patient were reviewed by me and considered in my medical decision making (see chart for details).       Definitely based on the description sounds as if there was like seizure-like activity but it was quite brief.  CT of head was done today because he has not had one for a good while.  Showed no acute abnormalities.  Labs without any  significant abnormality.  Patient is remained very stable here.  Cardiac monitoring here without evidence of any arrhythmia.  Will refer patient to neurology for outpatient work-up.  Patient stable for discharge home and to continue to follow with heme oncology clinic.  Patient will return for any recurrent symptoms or any new or worse symptoms.   Final Clinical Impressions(s) / ED Diagnoses   Final diagnoses:  Seizure-like activity [facility])    ED Discharge Orders    None       Fredia Sorrow, MD 02/16/19 2043

## 2019-02-16 NOTE — Discharge Instructions (Signed)
Work-up here today without any acute findings.  But symptoms very suggestive of seizure-like activity.  Make an appointment to follow-up with neurology.  Return for any new or worse symptoms

## 2019-02-16 NOTE — ED Triage Notes (Signed)
Patient baseline nonverbal.  Patient also has open tracheostomy opening with no obturator.  Patient appears to not be in any distress.  Respiration even, non labored.  98% room air.

## 2019-02-17 DIAGNOSIS — C4442 Squamous cell carcinoma of skin of scalp and neck: Secondary | ICD-10-CM | POA: Diagnosis not present

## 2019-02-17 DIAGNOSIS — H547 Unspecified visual loss: Secondary | ICD-10-CM | POA: Diagnosis not present

## 2019-02-17 DIAGNOSIS — Z483 Aftercare following surgery for neoplasm: Secondary | ICD-10-CM | POA: Diagnosis not present

## 2019-02-17 DIAGNOSIS — C140 Malignant neoplasm of pharynx, unspecified: Secondary | ICD-10-CM | POA: Diagnosis not present

## 2019-02-17 DIAGNOSIS — M1990 Unspecified osteoarthritis, unspecified site: Secondary | ICD-10-CM | POA: Diagnosis not present

## 2019-02-17 DIAGNOSIS — E039 Hypothyroidism, unspecified: Secondary | ICD-10-CM | POA: Diagnosis not present

## 2019-02-17 DIAGNOSIS — C329 Malignant neoplasm of larynx, unspecified: Secondary | ICD-10-CM | POA: Diagnosis not present

## 2019-02-17 DIAGNOSIS — Z431 Encounter for attention to gastrostomy: Secondary | ICD-10-CM | POA: Diagnosis not present

## 2019-02-17 DIAGNOSIS — I10 Essential (primary) hypertension: Secondary | ICD-10-CM | POA: Diagnosis not present

## 2019-02-21 DIAGNOSIS — C329 Malignant neoplasm of larynx, unspecified: Secondary | ICD-10-CM | POA: Diagnosis not present

## 2019-02-21 DIAGNOSIS — Z483 Aftercare following surgery for neoplasm: Secondary | ICD-10-CM | POA: Diagnosis not present

## 2019-02-21 DIAGNOSIS — M1990 Unspecified osteoarthritis, unspecified site: Secondary | ICD-10-CM | POA: Diagnosis not present

## 2019-02-21 DIAGNOSIS — C4442 Squamous cell carcinoma of skin of scalp and neck: Secondary | ICD-10-CM | POA: Diagnosis not present

## 2019-02-21 DIAGNOSIS — E039 Hypothyroidism, unspecified: Secondary | ICD-10-CM | POA: Diagnosis not present

## 2019-02-21 DIAGNOSIS — C140 Malignant neoplasm of pharynx, unspecified: Secondary | ICD-10-CM | POA: Diagnosis not present

## 2019-02-21 DIAGNOSIS — H547 Unspecified visual loss: Secondary | ICD-10-CM | POA: Diagnosis not present

## 2019-02-21 DIAGNOSIS — Z431 Encounter for attention to gastrostomy: Secondary | ICD-10-CM | POA: Diagnosis not present

## 2019-02-21 DIAGNOSIS — I10 Essential (primary) hypertension: Secondary | ICD-10-CM | POA: Diagnosis not present

## 2019-02-22 ENCOUNTER — Ambulatory Visit: Admitting: Urology

## 2019-02-22 ENCOUNTER — Other Ambulatory Visit: Payer: Self-pay | Admitting: Family Medicine

## 2019-02-22 DIAGNOSIS — C61 Malignant neoplasm of prostate: Secondary | ICD-10-CM

## 2019-02-22 DIAGNOSIS — R1310 Dysphagia, unspecified: Secondary | ICD-10-CM | POA: Diagnosis not present

## 2019-02-22 DIAGNOSIS — Z930 Tracheostomy status: Secondary | ICD-10-CM | POA: Diagnosis not present

## 2019-02-22 DIAGNOSIS — R9721 Rising PSA following treatment for malignant neoplasm of prostate: Secondary | ICD-10-CM

## 2019-02-22 DIAGNOSIS — C760 Malignant neoplasm of head, face and neck: Secondary | ICD-10-CM | POA: Diagnosis not present

## 2019-02-22 DIAGNOSIS — C329 Malignant neoplasm of larynx, unspecified: Secondary | ICD-10-CM | POA: Diagnosis not present

## 2019-02-22 DIAGNOSIS — E559 Vitamin D deficiency, unspecified: Secondary | ICD-10-CM | POA: Diagnosis not present

## 2019-02-22 DIAGNOSIS — E43 Unspecified severe protein-calorie malnutrition: Secondary | ICD-10-CM | POA: Diagnosis not present

## 2019-02-24 ENCOUNTER — Other Ambulatory Visit (HOSPITAL_COMMUNITY): Payer: Self-pay | Admitting: Nurse Practitioner

## 2019-02-24 DIAGNOSIS — Z483 Aftercare following surgery for neoplasm: Secondary | ICD-10-CM | POA: Diagnosis not present

## 2019-02-24 DIAGNOSIS — C4442 Squamous cell carcinoma of skin of scalp and neck: Secondary | ICD-10-CM | POA: Diagnosis not present

## 2019-02-24 DIAGNOSIS — M1990 Unspecified osteoarthritis, unspecified site: Secondary | ICD-10-CM | POA: Diagnosis not present

## 2019-02-24 DIAGNOSIS — C140 Malignant neoplasm of pharynx, unspecified: Secondary | ICD-10-CM | POA: Diagnosis not present

## 2019-02-24 DIAGNOSIS — C329 Malignant neoplasm of larynx, unspecified: Secondary | ICD-10-CM | POA: Diagnosis not present

## 2019-02-24 DIAGNOSIS — E039 Hypothyroidism, unspecified: Secondary | ICD-10-CM | POA: Diagnosis not present

## 2019-02-24 DIAGNOSIS — Z431 Encounter for attention to gastrostomy: Secondary | ICD-10-CM | POA: Diagnosis not present

## 2019-02-24 DIAGNOSIS — I10 Essential (primary) hypertension: Secondary | ICD-10-CM | POA: Diagnosis not present

## 2019-02-24 DIAGNOSIS — H547 Unspecified visual loss: Secondary | ICD-10-CM | POA: Diagnosis not present

## 2019-02-28 DIAGNOSIS — Z483 Aftercare following surgery for neoplasm: Secondary | ICD-10-CM | POA: Diagnosis not present

## 2019-02-28 DIAGNOSIS — I10 Essential (primary) hypertension: Secondary | ICD-10-CM | POA: Diagnosis not present

## 2019-02-28 DIAGNOSIS — C329 Malignant neoplasm of larynx, unspecified: Secondary | ICD-10-CM | POA: Diagnosis not present

## 2019-02-28 DIAGNOSIS — H547 Unspecified visual loss: Secondary | ICD-10-CM | POA: Diagnosis not present

## 2019-02-28 DIAGNOSIS — M1990 Unspecified osteoarthritis, unspecified site: Secondary | ICD-10-CM | POA: Diagnosis not present

## 2019-02-28 DIAGNOSIS — Z431 Encounter for attention to gastrostomy: Secondary | ICD-10-CM | POA: Diagnosis not present

## 2019-02-28 DIAGNOSIS — E039 Hypothyroidism, unspecified: Secondary | ICD-10-CM | POA: Diagnosis not present

## 2019-02-28 DIAGNOSIS — C4442 Squamous cell carcinoma of skin of scalp and neck: Secondary | ICD-10-CM | POA: Diagnosis not present

## 2019-02-28 DIAGNOSIS — C140 Malignant neoplasm of pharynx, unspecified: Secondary | ICD-10-CM | POA: Diagnosis not present

## 2019-03-01 ENCOUNTER — Ambulatory Visit (HOSPITAL_COMMUNITY): Admitting: Hematology

## 2019-03-01 ENCOUNTER — Other Ambulatory Visit (HOSPITAL_COMMUNITY)

## 2019-03-01 ENCOUNTER — Ambulatory Visit (HOSPITAL_COMMUNITY)

## 2019-03-01 ENCOUNTER — Encounter (HOSPITAL_COMMUNITY): Admitting: Dietician

## 2019-03-01 ENCOUNTER — Telehealth (HOSPITAL_COMMUNITY): Payer: Self-pay

## 2019-03-01 NOTE — Telephone Encounter (Signed)
Otila Kluver from [facility] called concerning the patients weight loss and asking if his feedings could be increased to 1 1/2 bottles.  The patients next appointment is March 6th.  Told home health I would send a note to the doctor and to the dietitian if that was ok with them to wait until Friday's appointment.  Verbalized understanding.    [place] number [phone]

## 2019-03-03 ENCOUNTER — Other Ambulatory Visit (HOSPITAL_COMMUNITY): Payer: Self-pay | Admitting: Nurse Practitioner

## 2019-03-04 ENCOUNTER — Ambulatory Visit (HOSPITAL_COMMUNITY): Admitting: Hematology

## 2019-03-04 ENCOUNTER — Encounter (HOSPITAL_COMMUNITY): Payer: Self-pay | Admitting: Hematology

## 2019-03-04 ENCOUNTER — Other Ambulatory Visit: Payer: Self-pay

## 2019-03-04 ENCOUNTER — Inpatient Hospital Stay (HOSPITAL_COMMUNITY): Payer: Medicare HMO

## 2019-03-04 ENCOUNTER — Inpatient Hospital Stay (HOSPITAL_COMMUNITY): Payer: Medicare HMO | Admitting: Hematology

## 2019-03-04 ENCOUNTER — Inpatient Hospital Stay (HOSPITAL_COMMUNITY): Payer: Medicare HMO | Attending: Hematology

## 2019-03-04 ENCOUNTER — Ambulatory Visit (HOSPITAL_COMMUNITY)

## 2019-03-04 ENCOUNTER — Other Ambulatory Visit (HOSPITAL_COMMUNITY)

## 2019-03-04 VITALS — BP 134/76 | HR 78 | Temp 98.2°F | Resp 18

## 2019-03-04 VITALS — BP 133/86 | HR 99 | Temp 99.0°F | Resp 18 | Wt 118.0 lb

## 2019-03-04 DIAGNOSIS — Z8546 Personal history of malignant neoplasm of prostate: Secondary | ICD-10-CM | POA: Insufficient documentation

## 2019-03-04 DIAGNOSIS — C329 Malignant neoplasm of larynx, unspecified: Secondary | ICD-10-CM | POA: Diagnosis not present

## 2019-03-04 DIAGNOSIS — I10 Essential (primary) hypertension: Secondary | ICD-10-CM | POA: Diagnosis not present

## 2019-03-04 DIAGNOSIS — Z87891 Personal history of nicotine dependence: Secondary | ICD-10-CM | POA: Diagnosis not present

## 2019-03-04 DIAGNOSIS — Z800 Family history of malignant neoplasm of digestive organs: Secondary | ICD-10-CM | POA: Insufficient documentation

## 2019-03-04 DIAGNOSIS — Z923 Personal history of irradiation: Secondary | ICD-10-CM | POA: Insufficient documentation

## 2019-03-04 DIAGNOSIS — C760 Malignant neoplasm of head, face and neck: Secondary | ICD-10-CM

## 2019-03-04 DIAGNOSIS — Z5112 Encounter for antineoplastic immunotherapy: Secondary | ICD-10-CM | POA: Diagnosis not present

## 2019-03-04 DIAGNOSIS — Z9221 Personal history of antineoplastic chemotherapy: Secondary | ICD-10-CM | POA: Diagnosis not present

## 2019-03-04 DIAGNOSIS — Z931 Gastrostomy status: Secondary | ICD-10-CM | POA: Insufficient documentation

## 2019-03-04 DIAGNOSIS — R42 Dizziness and giddiness: Secondary | ICD-10-CM | POA: Diagnosis not present

## 2019-03-04 DIAGNOSIS — Z79899 Other long term (current) drug therapy: Secondary | ICD-10-CM | POA: Insufficient documentation

## 2019-03-04 DIAGNOSIS — C4442 Squamous cell carcinoma of skin of scalp and neck: Secondary | ICD-10-CM

## 2019-03-04 DIAGNOSIS — M542 Cervicalgia: Secondary | ICD-10-CM | POA: Diagnosis not present

## 2019-03-04 DIAGNOSIS — R918 Other nonspecific abnormal finding of lung field: Secondary | ICD-10-CM

## 2019-03-04 DIAGNOSIS — E039 Hypothyroidism, unspecified: Secondary | ICD-10-CM | POA: Diagnosis not present

## 2019-03-04 LAB — CBC WITH DIFFERENTIAL/PLATELET
Abs Immature Granulocytes: 0.01 10*3/uL (ref 0.00–0.07)
Basophils Absolute: 0 10*3/uL (ref 0.0–0.1)
Basophils Relative: 0 %
Eosinophils Absolute: 0 10*3/uL (ref 0.0–0.5)
Eosinophils Relative: 0 %
HCT: 39.9 % (ref 39.0–52.0)
Hemoglobin: 12.2 g/dL — ABNORMAL LOW (ref 13.0–17.0)
Immature Granulocytes: 0 %
Lymphocytes Relative: 11 %
Lymphs Abs: 0.6 10*3/uL — ABNORMAL LOW (ref 0.7–4.0)
MCH: 26.1 pg (ref 26.0–34.0)
MCHC: 30.6 g/dL (ref 30.0–36.0)
MCV: 85.3 fL (ref 80.0–100.0)
Monocytes Absolute: 0.6 10*3/uL (ref 0.1–1.0)
Monocytes Relative: 10 %
NEUTROS PCT: 79 %
Neutro Abs: 4.4 10*3/uL (ref 1.7–7.7)
PLATELETS: 298 10*3/uL (ref 150–400)
RBC: 4.68 MIL/uL (ref 4.22–5.81)
RDW: 17.5 % — ABNORMAL HIGH (ref 11.5–15.5)
WBC: 5.6 10*3/uL (ref 4.0–10.5)
nRBC: 0 % (ref 0.0–0.2)

## 2019-03-04 LAB — COMPREHENSIVE METABOLIC PANEL WITH GFR
ALT: 81 U/L — ABNORMAL HIGH (ref 0–44)
AST: 57 U/L — ABNORMAL HIGH (ref 15–41)
Albumin: 3.6 g/dL (ref 3.5–5.0)
Alkaline Phosphatase: 95 U/L (ref 38–126)
Anion gap: 9 (ref 5–15)
BUN: 12 mg/dL (ref 8–23)
CO2: 30 mmol/L (ref 22–32)
Calcium: 9 mg/dL (ref 8.9–10.3)
Chloride: 90 mmol/L — ABNORMAL LOW (ref 98–111)
Creatinine, Ser: 0.64 mg/dL (ref 0.61–1.24)
GFR calc Af Amer: >60 mL/min
GFR calc non Af Amer: >60 mL/min
Glucose, Bld: 154 mg/dL — ABNORMAL HIGH (ref 70–99)
Potassium: 4 mmol/L (ref 3.5–5.1)
Sodium: 129 mmol/L — ABNORMAL LOW (ref 135–145)
Total Bilirubin: 0.5 mg/dL (ref 0.3–1.2)
Total Protein: 7.2 g/dL (ref 6.5–8.1)

## 2019-03-04 LAB — TSH: TSH: 18.08 u[IU]/mL — ABNORMAL HIGH (ref 0.350–4.500)

## 2019-03-04 MED ORDER — SODIUM CHLORIDE 0.9 % IV SOLN
Freq: Once | INTRAVENOUS | Status: AC
  Administered 2019-03-04: 11:00:00 via INTRAVENOUS

## 2019-03-04 MED ORDER — HEPARIN SOD (PORK) LOCK FLUSH 100 UNIT/ML IV SOLN: 500.0000 [IU] | Freq: Once | INTRAVENOUS | Status: DC | PRN

## 2019-03-04 MED ORDER — SODIUM CHLORIDE 0.9% FLUSH
10.0000 mL | INTRAVENOUS | Status: DC | PRN
  Administered 2019-03-04: 10 mL
  Filled 2019-03-04: qty 10

## 2019-03-04 MED ORDER — SODIUM CHLORIDE 0.9 % IV SOLN
200.0000 mg | Freq: Once | INTRAVENOUS | Status: AC
  Administered 2019-03-04: 200 mg via INTRAVENOUS
  Filled 2019-03-04: qty 8

## 2019-03-04 NOTE — Patient Instructions (Signed)
[facility] Discharge Instructions for Patients Receiving Chemotherapy  Today you received the following chemotherapy agents  If you develop nausea and vomiting that is not controlled by your nausea medication, call the clinic.   BELOW ARE SYMPTOMS THAT SHOULD BE REPORTED IMMEDIATELY:  *FEVER GREATER THAN 100.5 F  *CHILLS WITH OR WITHOUT FEVER  NAUSEA AND VOMITING THAT IS NOT CONTROLLED WITH YOUR NAUSEA MEDICATION  *UNUSUAL SHORTNESS OF BREATH  *UNUSUAL BRUISING OR BLEEDING  TENDERNESS IN MOUTH AND THROAT WITH OR WITHOUT PRESENCE OF ULCERS  *URINARY PROBLEMS  *BOWEL PROBLEMS  UNUSUAL RASH Items with * indicate a potential emergency and should be followed up as soon as possible.  Feel free to call the clinic should you have any questions or concerns. The clinic phone number is [phone].  Please show the CHEMO ALERT CARD at check-in to the Emergency Department and triage nurse.   

## 2019-03-04 NOTE — Progress Notes (Signed)
Nutrition Follow-up:  Patient with laryngeal carcinoma originally dx in 2015.  Recurred in mid 2019 s/p total laryngectomy 9/9.  Was gradually weaned off PEG feeding, with tube left in place.  Repeat CT in December showed recurrence.  Enrolled in hospice, but revoked it when he was deemed candidate for immunotherapy.  Patient receiving [place].    Met with patient, [name] and niece Brandon Hall.  Wife reports give 1 carton of tube feeding 4 times per day (8 am, noon, 4 pm and 8 pm).  Water flush wife first told RD was 2 syringe fulls before and after feeding, then changed it to 2 syringe fulls after feeding (1101m). RD unsure true water flush that is being given.  Patient shakes head yes to he feels ok after getting tube feeding.   Oral intake wife is hard to determine from wife.  Wife gets off track with talking about pain in his mouth and foods she cooks for him and having to walk to [place] one able to take her. Yesterday wife reports patient ate 1 egg for breakfast, no lunch and few bites of vegetables for dinner and ate jello.  Wife reports patient does drink tea and juice.  Wife also reports that he drinks 2 ensure/boost daily, thinks it is original (220-240 kcals, 9 g protein/shake).    Brandon Laymanreports that she comes over when they call her.  Does not live with patient and wife.  May only stay a few hours when she comes.  Not able to tell RD how often she provides care/assistance to wife and patient.   Wife reports had bowel movement yesterday all over his self.  During discussion seems as if wife gave patient miralax.  Unable to determine if she gives patient miralax daily or just when not having bowel movements. Brandon Hall unable to provide any information.    Medications: miralax, compazine, VIt D  Labs: Na 129, glucose 154   Anthropometrics:   Weight today in clinic 118 lb increased from weight of 115 lb on 02/08/2019 clinic visit.     Estimated Energy Needs  Kcals:  1750-2000 Protein: 78-94 g Fluid: 1.6-1.8 L/d  NUTRITION DIAGNOSIS: Moderate malnutrition continues   MALNUTRITION DIAGNOSIS: moderate malnutrition chronic continues   INTERVENTION:  Recommend patient continue osmolite 1.5 1 carton 4 times per day (8am, noon, 4pm and 8pm).  Water flush of 649mbefore and after each feeding (QID). Written tube feeding regimen given to wife.   Patient is able to drink orally and will need to continue drinking orally to meet hydration needs.  Discussed that with patient, wife and Mo[place]  Patient needs to continue to drink ensure/boost 2 times daily orally to better meet nutritional needs.  With ensure/boost original shake BID plus 4 cartons of tube feeding will provide 1860 kcals, 77 g protein and 1426 ml free water (including water flush). Patient given another case of ensure enlive today to drink orally.   Would not recommend changes at this time in tube feeding or oral regimen with recent weight gain.  Reviewed with wife that she does not need to give miralax if patient is having bowel movement on regular basis.     MONITORING, EVALUATION, GOAL: weight trends, tube feeding   NEXT VISIT: March 27 during infusion  Brandon Hall B. Brandon ResidesRDTuolumneLDLas Nutriasegistered Dietitian [phone])

## 2019-03-04 NOTE — Progress Notes (Signed)
Patient tolerated treatment with no complaints voiced.  Peripheral IV site with good blood return noted before and after treatment.  Site clean and dry with no bruising or swelling noted at site.  Band aid applied.  Patient left ambulatory with VSS and no s/s of distress noted.

## 2019-03-04 NOTE — Patient Instructions (Addendum)
[place] at [facility] Discharge Instructions  You were seen today by Dr. Delton Coombes, he went over how you're feeling and your recent ER visits. He also discussed your pain levels. We will see you back in 3 weeks for labs, treatment and follow up.  Thank you for choosing [place] at [facility] to provide your oncology and hematology care.  To afford each patient quality time with our provider, please arrive at least 15 minutes before your scheduled appointment time.   If you have a lab appointment with the [facility] please come in thru the  Main Entrance and check in at the main information desk  You need to re-schedule your appointment should you arrive 10 or more minutes late.  We strive to give you quality time with our providers, and arriving late affects you and other patients whose appointments are after yours.  Also, if you no show three or more times for appointments you may be dismissed from the clinic at the providers discretion.     Again, thank you for choosing [facility].  Our hope is that these requests will decrease the amount of time that you wait before being seen by our physicians.       _____________________________________________________________  Should you have questions after your visit to [facility], please contact our office at [phone] between the hours of 8:00 a.m. and 4:30 p.m.  Voicemails left after 4:00 p.m. will not be returned until the following business day.  For prescription refill requests, have your pharmacy contact our office and allow 72 hours.    Cancer Center Support Programs:   > Cancer Support Group  2nd Tuesday of the month 1pm-2pm, Journey Room

## 2019-03-04 NOTE — Progress Notes (Signed)
[place], [place] [address]   CLINIC:  Medical Oncology/Hematology  PCP:  Dettinger, Fransisca Kaufmann, MD [place] [address] [phone]   REASON FOR VISIT: Follow-up for INVASIVE SQUAMOUS CELL CARCINOMA of the larynx, POORLY DIFFERENTIATED  CURRENT THERAPY:  Pembrolizumab every 3 weeks.  BRIEF ONCOLOGIC HISTORY:    Squamous cell carcinoma of head and neck (HCC)   05/05/2014 Imaging    CT scan of the neck show enlarged left lymph node measure less than 6 cm.    05/25/2014 Pathology Results    [phone] is positive for squamous cell carcinoma.    05/25/2014 Procedure    Lymph node biopsy of the left neck came back squamous cell carcinoma.    06/20/2014 Imaging    PET CT scan show no evidence of distant metastatic disease.    06/29/2014 Surgery    Laryngoscopy and random biopsy of the nasopharynx was negative for malignancy.    07/27/2014 - 09/27/2014 Chemotherapy    Cisplatin + 5FU (days 1-5) every 28 days 4 cycles    11/06/2014 - 12/25/2014 Radiation Therapy    In [place] with Dr. Isidore Moos    04/25/2015 Imaging    PET - Rest[place].  No evidence of hypermetabolic recurrent or metastatic disease.    05/17/2015 Pathology Results    [id number]:  p16 (HPV) negative.    02/08/2019 -  Chemotherapy    The patient had pembrolizumab (KEYTRUDA) 200 mg in sodium chloride 0.9 % 50 mL chemo infusion, 200 mg, Intravenous, Once, 2 of 6 cycles Administration: 200 mg (02/08/2019)  for chemotherapy treatment.      Laryngeal cancer ([place])   08/19/2018 Initial Diagnosis    Laryngeal cancer ([place])    02/08/2019 -  Chemotherapy    The patient had pembrolizumab (KEYTRUDA) 200 mg in sodium chloride 0.9 % 50 mL chemo infusion, 200 mg, Intravenous, Once, 2 of 6 cycles Administration: 200 mg (02/08/2019)  for chemotherapy treatment.       CANCER STAGING: Cancer Staging Squamous cell carcinoma of head and neck (HCC) Staging form: Pharynx - Nasopharynx,  AJCC 7th Edition - Clinical: Stage IVB (TX, N3a, M0) - Signed by Eppie Gibson, MD on 07/13/2014    INTERVAL HISTORY:  Mr. Gendron 63 y.o. male returns for follow-up of his head and neck cancer.  He started his first cycle of immunotherapy 3 weeks ago.  He had been to the ER couple of times, once for PEG tube malfunction and second time for possible seizure-like activity.  A CT scan was done and was negative.  Wife reported that he had shaking of his hands for few seconds.  He was conscious at that time.  He also reports feeling dizzy when he stands up suddenly from lying position.  His pain is fairly well-controlled on the current regimen.  He is taking 1 can every 4 hours through the PEG tube.  He also drinks boost occasionally.  His appetite is 50% and energy levels are 50%.   REVIEW OF SYSTEMS:  Review of Systems  HENT:   Positive for trouble swallowing.   Musculoskeletal: Positive for neck pain.  All other systems reviewed and are negative.    PAST MEDICAL/SURGICAL HISTORY:  Past Medical History:  Diagnosis Date  . Arthritis   . Blindness of right eye   . Cancer ([place])    pharyngeal ca  . History of radiation therapy 11/2014   [id number] squamous cell carcinoma of L neck, Stage IVB,  unknown primary 70 Gy in 35 fractions treated at [facility], finished 12/25/14  . History of shingles    RASH  02-03-2014  PER PCP RESOLVING  . Hypertension    NO MEDS ,  MONITORED BY PCP  . Hypothyroidism, postradioiodine therapy   . Prostate cancer ([place]) 07/12/2014   DX  SEVERAL  YRS   AGO  WITH RADIATION THERAPY  . Squamous cell carcinoma of head and neck ([place]) 07/12/2014  . 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 COLONOSCOPY  10/20/2011   Procedure: COLONOSCOPY;  Surgeon: Daneil Dolin, MD;  [facility];  Service: Endoscopy;  Laterality: N/A;  1:00 pm  . COLONOSCOPY N/A 02/03/2018   Procedure: COLONOSCOPY;   Surgeon: Daneil Dolin, MD;  [facility];  Service: Endoscopy;  Laterality: N/A;  2:15  . CRYOABLATION N/A 04/07/2014   Procedure: SALVAGE CRYO ABLATION PROSTATE;  Surgeon: Lowella Bandy, MD;  Location[facility];  Service: Urology;  Laterality: N/A;  . DIRECT LARYNGOSCOPY N/A 09/06/2018   Procedure: DIRECT LARYNGOSCOPY biopsy with frozen section;  Surgeon: Izora Gala, MD;  Location: [facility];  Service: ENT;  Laterality: N/A;  . IR GASTROSTOMY TUBE MOD SED  08/20/2018  . IR GENERIC HISTORICAL  03/27/2017   IR REMOVAL TUN ACCESS W/ PORT W/O FL MOD SED [facility]-INTERV RAD  . LARYNGETOMY N/A 09/06/2018   Procedure: total LARYNGECTOMY;  Surgeon: Izora Gala, MD;  Location: [facility];  Service: ENT;  Laterality: N/A;  . LARYNGOSCOPY    . LYMPH NODE BIOPSY    . RADICAL NECK DISSECTION Right 09/06/2018   Procedure: Modified NECK DISSECTION;  Surgeon: Izora Gala, MD;  Location: [facility];  Service: ENT;  Laterality: Right;     SOCIAL HISTORY:  Social History   Socioeconomic History  . Marital status: Married    Spouse name: Not on file  . Number of children: Not on file  . Years of education: Not on file  . Highest education level: Not on file  Occupational History  . Occupation: disabled  Social Needs  . Financial resource strain: Not very hard  . Food insecurity:    Worry: Never true    Inability: Never true  . Transportation needs:    Medical: No    Non-medical: No  Tobacco Use  . Smoking status: Former Smoker    Packs/day: 1.00    Years: 40.00    Pack years: 40.00    Types: Cigarettes    Last attempt to quit: 09/29/2013    Years since quitting: 5.4  . Smokeless tobacco: Never Used  Substance and Sexual Activity  . Alcohol use: No  . Drug use: No  . Sexual activity: Not on file  Lifestyle  . Physical activity:    Days per week: 3 days    Minutes per session: 30 min  . Stress: Only a little  Relationships  . Social connections:    Talks on phone: Never    Gets  together: More than three times a week    Attends religious service: More than 4 times per year    Active member of club or organization: Yes    Attends meetings of clubs or organizations: More than 4 times per year    Relationship status: Married  . Intimate partner violence:    Fear of current or ex partner: No    Emotionally abused: No    Physically  abused: No    Forced sexual activity: No  Other Topics Concern  . Not on file  Social History Narrative   Lives with wife in an apartment. He has stepchildren. They walk most everywhere they go locally but have access to transportation if needed.    FAMILY HISTORY:  Family History  Problem Relation Age of Onset  . Colon cancer Father   . Hypertension Mother   . Colon cancer Brother   . Colon cancer Brother     CURRENT MEDICATIONS:  Outpatient Encounter Medications as of 03/04/2019  Medication Sig  . hydrochlorothiazide (HYDRODIURIL) 12.5 MG tablet Take 1 tablet (12.5 mg total) by mouth daily.  Marland Kitchen HYDROcodone-acetaminophen (HYCET) 7.5-325 mg/15 ml solution Take 10 mLs by mouth every 4 (four) hours as needed for moderate pain or severe pain.  Marland Kitchen levothyroxine (SYNTHROID, LEVOTHROID) 100 MCG tablet Place 1 tablet (100 mcg total) into feeding tube daily before breakfast. (Patient taking differently: Take 100 mcg by mouth daily before breakfast. )  . Pembrolizumab (KEYTRUDA IV) Inject into the vein every 21 ( twenty-one) days.  . polyethylene glycol (MIRALAX / GLYCOLAX) packet Take 17 g by mouth daily as needed for mild constipation or moderate constipation.   . pravastatin (PRAVACHOL) 40 MG tablet Take 1 tablet (40 mg total) by mouth daily.  . Vitamin D, Ergocalciferol, (DRISDOL) 1.25 MG (50000 UT) CAPS capsule TAKE 1 CAPSULE EVERY 7 DAYS.  Marland Kitchen prochlorperazine (COMPAZINE) 10 MG tablet Take 1 tablet (10 mg total) by mouth every 6 (six) hours as needed (Nausea or vomiting). (Patient not taking: Reported on 03/04/2019)   No facility-administered  encounter medications on file as of 03/04/2019.     ALLERGIES:  No Known Allergies   PHYSICAL EXAM:  ECOG Performance status: 1  Vitals:   03/04/19 1001  BP: 133/86  Pulse: 99  Resp: 18  Temp: 99 F (37.2 C)  SpO2: 98%   Filed Weights   03/04/19 1001  Weight: 118 lb (53.5 kg)    Physical Exam Constitutional:      Appearance: Normal appearance. He is normal weight.  Musculoskeletal: Normal range of motion.  Skin:    General: Skin is warm and dry.  Neurological:     Mental Status: He is alert and oriented to person, place, and time. Mental status is at baseline.  Psychiatric:        Mood and Affect: Mood normal.        Behavior: Behavior normal.        Thought Content: Thought content normal.        Judgment: Judgment normal.    Chest: Clear to auscultation. CVS: S1-S2 regular rate and rhythm.  LABORATORY DATA:  I have reviewed the labs as listed.  CBC    Component Value Date/Time   WBC 5.6 03/04/2019 0930   RBC 4.68 03/04/2019 0930   HGB 12.2 (L) 03/04/2019 0930   HGB 14.8 11/11/2018 1147   HCT 39.9 03/04/2019 0930   HCT 47.0 11/11/2018 1147   PLT 298 03/04/2019 0930   PLT 180 11/11/2018 1147   MCV 85.3 03/04/2019 0930   MCV 78 (L) 11/11/2018 1147   MCH 26.1 03/04/2019 0930   MCHC 30.6 03/04/2019 0930   RDW 17.5 (H) 03/04/2019 0930   RDW 17.8 (H) 11/11/2018 1147   LYMPHSABS 0.6 (L) 03/04/2019 0930   LYMPHSABS 1.1 11/11/2018 1147   MONOABS 0.6 03/04/2019 0930   EOSABS 0.0 03/04/2019 0930   EOSABS 0.0 11/11/2018 1147   BASOSABS 0.0  03/04/2019 0930   BASOSABS 0.0 11/11/2018 1147   CMP Latest Ref Rng & Units 03/04/2019 02/16/2019 02/08/2019  Glucose 70 - 99 mg/dL 154(H) 125(H) 97  BUN 8 - 23 mg/dL 12 11 10   Creatinine 0.61 - 1.24 mg/dL 0.64 0.62 0.73  Sodium 135 - 145 mmol/L 129(L) 134(L) 133(L)  Potassium 3.5 - 5.1 mmol/L 4.0 3.5 3.9  Chloride 98 - 111 mmol/L 90(L) 93(L) 97(L)  CO2 22 - 32 mmol/L 30 28 26   Calcium 8.9 - 10.3 mg/dL 9.0 9.4 8.9  Total  Protein 6.5 - 8.1 g/dL 7.2 8.0 7.1  Total Bilirubin 0.3 - 1.2 mg/dL 0.5 0.4 0.6  Alkaline Phos 38 - 126 U/L 95 73 64  AST 15 - 41 U/L 57(H) 33 23  ALT 0 - 44 U/L 81(H) 24 20       DIAGNOSTIC IMAGING:  I have independently reviewed the scans and discussed with the patient.     ASSESSMENT & PLAN:   Squamous cell carcinoma of head and neck 1.  Recurrent squamous cell carcinoma of the head and neck: -Patient with 40-pack-year smoking history, quit in 2015. 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 - PET CT scan on 08/06/2018 shows large recurrent hypermetabolic right supraglottic mass with single hypermetabolic level 2 lymph node on the right. - On 09/06/2018, he underwent total laryngectomy and right neck dissection of levels 2, 3, and 4. - We discussed the pathology which showed invasive squamous cell carcinoma, poorly differentiated, spanning 3.9 cm, carcinoma involves skeletal muscle, thyroid cartilage and cricoid cartilage.  Positive lymphovascular invasion and perineural invasion.  No evidence of carcinoma in 4 out of 4 lymph nodes. - He was reportedly placed in hospice by his pulmonologist 2 to 3 days ago. - We discussed the results of the CT soft tissue neck on 12/23/2018 showing large enhancing mass in the region of the right tonsil, new and measures 28 x 24 mm.  Ring-enhancing fluid collection in the anterior neck below the base of the tongue is also new.  This could represent recurrent tumor or postop fluid.  Enlarging enhancing right level 2B lymph node measuring 14 x 21 mm compatible with tumor growth.  8 mm necrotic lymph node in the left anterior mid neck compatible with tumor.  15 mm necrotic node in the left lower neck compatible with tumor.  12 mm spiculated mass in the left apex new compared to prior studies.  4 mm and 5  mm left upper lobe nodules are also new. - Patient is in fairly good performance status.  He did not experience any major side effects from prior chemotherapy in 2015.  He does all his ADLs and IADLs. -His appetite has been declining.  He has not been using PEG tube since October 2019.  He is not able to swallow solid foods.  We will make a referral to our dietitian. -We have reviewed results of PDL 1 testing.  CPS score is 1%. -PET/CT scan on 01/26/2019 shows bulky intensely hypermetabolic tissue in the posterior right oropharynx consistent with squamous cell carcinoma recurrence.  Bilateral hypermetabolic nodal metastasis within the cervical lymph nodes.  No evidence of metastatic disease outside the neck. -Based on this, we have recommended single agent pembrolizumab (keynote-048 study).  Objective responses are quoted around 20%. - He started cycle 1 of Keytruda on 02/08/2019. -  He presented to the ER on 02/13/2019 with PEG tube problem.  On 02/16/2019 he came to the ER with possible seizure-like activity which lasted few seconds.  Wife reports that his hands were shaking for a few seconds.  A CT scan of the head done in the ER reviewed by me did not show any major abnormalities. - He does feel dizzy occasionally when he gets out of the bed fast.  He was told to sit by the side of the bed for 5 minutes before standing up.  Only blood pressure medicine he is on is hydrochlorothiazide 12.5 mg. - We have reviewed his labs.  He may proceed with cycle 2 today.  I will see him back in 3 weeks for follow-up.  I plan to repeat scans after cycle 3.  2.  Nutrition: - He is apparently taking Osmolite 1.5, 1 can every 4 hours. - He is also drinking boost 1 to 2 cans/day.  3.  Neck pain: - He has some right neck pain. - He is taking hydrocodone 7.5 mg per 15 mL, 10 mL's every 4 hours as needed.  This is fairly well controlled on this regimen.       Orders placed this encounter:  Orders Placed This  Encounter  Procedures  . CBC with Differential/Platelet  . Comprehensive metabolic panel  . TSH      Derek Jack, [place] [phone]

## 2019-03-04 NOTE — Assessment & Plan Note (Signed)
1.  Recurrent squamous cell carcinoma of the head and neck: -Patient with 40-pack-year smoking history, quit in 2015. 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 - PET CT scan on 08/06/2018 shows large recurrent hypermetabolic right supraglottic mass with single hypermetabolic level 2 lymph node on the right. - On 09/06/2018, he underwent total laryngectomy and right neck dissection of levels 2, 3, and 4. - We discussed the pathology which showed invasive squamous cell carcinoma, poorly differentiated, spanning 3.9 cm, carcinoma involves skeletal muscle, thyroid cartilage and cricoid cartilage.  Positive lymphovascular invasion and perineural invasion.  No evidence of carcinoma in 4 out of 4 lymph nodes. - He was reportedly placed in hospice by his pulmonologist 2 to 3 days ago. - We discussed the results of the CT soft tissue neck on 12/23/2018 showing large enhancing mass in the region of the right tonsil, new and measures 28 x 24 mm.  Ring-enhancing fluid collection in the anterior neck below the base of the tongue is also new.  This could represent recurrent tumor or postop fluid.  Enlarging enhancing right level 2B lymph node measuring 14 x 21 mm compatible with tumor growth.  8 mm necrotic lymph node in the left anterior mid neck compatible with tumor.  15 mm necrotic node in the left lower neck compatible with tumor.  12 mm spiculated mass in the left apex new compared to prior studies.  4 mm and 5 mm left upper lobe nodules are also new. - Patient is in fairly good performance status.  He did not experience any major side effects from prior chemotherapy in 2015.  He does all his ADLs and IADLs. -His appetite has been declining.  He has not been using PEG tube since October 2019.  He is not able to swallow solid foods.  We will make a referral to  our dietitian. -We have reviewed results of PDL 1 testing.  CPS score is 1%. -PET/CT scan on 01/26/2019 shows bulky intensely hypermetabolic tissue in the posterior right oropharynx consistent with squamous cell carcinoma recurrence.  Bilateral hypermetabolic nodal metastasis within the cervical lymph nodes.  No evidence of metastatic disease outside the neck. -Based on this, we have recommended single agent pembrolizumab (keynote-048 study).  Objective responses are quoted around 20%. - He started cycle 1 of Keytruda on 02/08/2019. - He presented to the ER on 02/13/2019 with PEG tube problem.  On 02/16/2019 he came to the ER with possible seizure-like activity which lasted few seconds.  Wife reports that his hands were shaking for a few seconds.  A CT scan of the head done in the ER reviewed by me did not show any major abnormalities. - He does feel dizzy occasionally when he gets out of the bed fast.  He was told to sit by the side of the bed for 5 minutes before standing up.  Only blood pressure medicine he is on is hydrochlorothiazide 12.5 mg. - We have reviewed his labs.  He may proceed with cycle 2 today.  I will see him back in 3 weeks for follow-up.  I plan to repeat scans after cycle 3.  2.  Nutrition: - He is apparently taking Osmolite 1.5, 1 can every 4 hours. - He is also drinking boost 1 to 2 cans/day.  3.  Neck pain: - He has some right neck pain. -  He is taking hydrocodone 7.5 mg per 15 mL, 10 mL's every 4 hours as needed.  This is fairly well controlled on this regimen.

## 2019-03-07 DIAGNOSIS — M1990 Unspecified osteoarthritis, unspecified site: Secondary | ICD-10-CM | POA: Diagnosis not present

## 2019-03-07 DIAGNOSIS — Z483 Aftercare following surgery for neoplasm: Secondary | ICD-10-CM | POA: Diagnosis not present

## 2019-03-07 DIAGNOSIS — E039 Hypothyroidism, unspecified: Secondary | ICD-10-CM | POA: Diagnosis not present

## 2019-03-07 DIAGNOSIS — C329 Malignant neoplasm of larynx, unspecified: Secondary | ICD-10-CM | POA: Diagnosis not present

## 2019-03-07 DIAGNOSIS — I10 Essential (primary) hypertension: Secondary | ICD-10-CM | POA: Diagnosis not present

## 2019-03-07 DIAGNOSIS — C4442 Squamous cell carcinoma of skin of scalp and neck: Secondary | ICD-10-CM | POA: Diagnosis not present

## 2019-03-07 DIAGNOSIS — H547 Unspecified visual loss: Secondary | ICD-10-CM | POA: Diagnosis not present

## 2019-03-07 DIAGNOSIS — Z431 Encounter for attention to gastrostomy: Secondary | ICD-10-CM | POA: Diagnosis not present

## 2019-03-07 DIAGNOSIS — C140 Malignant neoplasm of pharynx, unspecified: Secondary | ICD-10-CM | POA: Diagnosis not present

## 2019-03-09 ENCOUNTER — Telehealth: Payer: Self-pay

## 2019-03-09 DIAGNOSIS — Z483 Aftercare following surgery for neoplasm: Secondary | ICD-10-CM | POA: Diagnosis not present

## 2019-03-09 DIAGNOSIS — C329 Malignant neoplasm of larynx, unspecified: Secondary | ICD-10-CM | POA: Diagnosis not present

## 2019-03-09 DIAGNOSIS — I10 Essential (primary) hypertension: Secondary | ICD-10-CM | POA: Diagnosis not present

## 2019-03-09 DIAGNOSIS — Z431 Encounter for attention to gastrostomy: Secondary | ICD-10-CM | POA: Diagnosis not present

## 2019-03-09 DIAGNOSIS — H547 Unspecified visual loss: Secondary | ICD-10-CM | POA: Diagnosis not present

## 2019-03-09 DIAGNOSIS — E039 Hypothyroidism, unspecified: Secondary | ICD-10-CM | POA: Diagnosis not present

## 2019-03-09 DIAGNOSIS — C140 Malignant neoplasm of pharynx, unspecified: Secondary | ICD-10-CM | POA: Diagnosis not present

## 2019-03-09 DIAGNOSIS — C4442 Squamous cell carcinoma of skin of scalp and neck: Secondary | ICD-10-CM | POA: Diagnosis not present

## 2019-03-09 DIAGNOSIS — M1990 Unspecified osteoarthritis, unspecified site: Secondary | ICD-10-CM | POA: Diagnosis not present

## 2019-03-09 NOTE — Telephone Encounter (Signed)
I called and spoke to Mr. Skaggs's wife and niece, Brayton Layman. I informed them that we are cancelling his appointment for this Friday 03/11/19. He is currently receiving treatment at [facility] for a recurrence of his Larynx cancer by Dr. Delton Coombes. Dr. Delton Coombes will refer him to Dr. Isidore Moos on an as needed basis. They both voiced their understanding and know to call me if they have any further questions or concerns.

## 2019-03-09 NOTE — Progress Notes (Signed)
error 

## 2019-03-11 ENCOUNTER — Ambulatory Visit
Admission: RE | Admit: 2019-03-11 | Discharge: 2019-03-11 | Disposition: A | Discharge status: Home / Self Care | Payer: Medicare Other | Source: Ambulatory Visit | Attending: Radiation Oncology | Admitting: Radiation Oncology

## 2019-03-14 ENCOUNTER — Other Ambulatory Visit (HOSPITAL_COMMUNITY): Payer: Self-pay | Admitting: Nurse Practitioner

## 2019-03-15 ENCOUNTER — Encounter (HOSPITAL_COMMUNITY): Admitting: Dietician

## 2019-03-15 DIAGNOSIS — Z431 Encounter for attention to gastrostomy: Secondary | ICD-10-CM | POA: Diagnosis not present

## 2019-03-15 DIAGNOSIS — C329 Malignant neoplasm of larynx, unspecified: Secondary | ICD-10-CM | POA: Diagnosis not present

## 2019-03-15 DIAGNOSIS — I10 Essential (primary) hypertension: Secondary | ICD-10-CM | POA: Diagnosis not present

## 2019-03-15 DIAGNOSIS — E039 Hypothyroidism, unspecified: Secondary | ICD-10-CM | POA: Diagnosis not present

## 2019-03-15 DIAGNOSIS — C4442 Squamous cell carcinoma of skin of scalp and neck: Secondary | ICD-10-CM | POA: Diagnosis not present

## 2019-03-15 DIAGNOSIS — R1310 Dysphagia, unspecified: Secondary | ICD-10-CM | POA: Diagnosis not present

## 2019-03-15 DIAGNOSIS — E43 Unspecified severe protein-calorie malnutrition: Secondary | ICD-10-CM | POA: Diagnosis not present

## 2019-03-15 DIAGNOSIS — M1990 Unspecified osteoarthritis, unspecified site: Secondary | ICD-10-CM | POA: Diagnosis not present

## 2019-03-15 DIAGNOSIS — E559 Vitamin D deficiency, unspecified: Secondary | ICD-10-CM | POA: Diagnosis not present

## 2019-03-15 DIAGNOSIS — H547 Unspecified visual loss: Secondary | ICD-10-CM | POA: Diagnosis not present

## 2019-03-15 DIAGNOSIS — C140 Malignant neoplasm of pharynx, unspecified: Secondary | ICD-10-CM | POA: Diagnosis not present

## 2019-03-15 DIAGNOSIS — Z483 Aftercare following surgery for neoplasm: Secondary | ICD-10-CM | POA: Diagnosis not present

## 2019-03-16 ENCOUNTER — Telehealth: Payer: Self-pay | Admitting: *Deleted

## 2019-03-16 DIAGNOSIS — C329 Malignant neoplasm of larynx, unspecified: Secondary | ICD-10-CM | POA: Diagnosis not present

## 2019-03-16 DIAGNOSIS — C140 Malignant neoplasm of pharynx, unspecified: Secondary | ICD-10-CM | POA: Diagnosis not present

## 2019-03-16 DIAGNOSIS — Z431 Encounter for attention to gastrostomy: Secondary | ICD-10-CM | POA: Diagnosis not present

## 2019-03-16 DIAGNOSIS — I10 Essential (primary) hypertension: Secondary | ICD-10-CM | POA: Diagnosis not present

## 2019-03-16 DIAGNOSIS — M1990 Unspecified osteoarthritis, unspecified site: Secondary | ICD-10-CM | POA: Diagnosis not present

## 2019-03-16 DIAGNOSIS — C4442 Squamous cell carcinoma of skin of scalp and neck: Secondary | ICD-10-CM | POA: Diagnosis not present

## 2019-03-16 DIAGNOSIS — E039 Hypothyroidism, unspecified: Secondary | ICD-10-CM | POA: Diagnosis not present

## 2019-03-16 DIAGNOSIS — H547 Unspecified visual loss: Secondary | ICD-10-CM | POA: Diagnosis not present

## 2019-03-16 DIAGNOSIS — Z483 Aftercare following surgery for neoplasm: Secondary | ICD-10-CM | POA: Diagnosis not present

## 2019-03-16 MED ORDER — NYSTATIN 100000 UNIT/ML MT SUSP: 5.0000 mL | Freq: Four times a day (QID) | OROMUCOSAL | 0 refills | Status: AC

## 2019-03-16 NOTE — Telephone Encounter (Signed)
VM from [place] w/ [facility] During visit this monring, pt's right check swollen and pt's tongue has white spots, like thrush May Rx for thrush be called to [place] Please advise and call both [facility] nurse and pt's home back, pt's # [phone]

## 2019-03-16 NOTE — Telephone Encounter (Signed)
I called in a medication for thrush

## 2019-03-16 NOTE — Telephone Encounter (Signed)
Patient aware.

## 2019-03-21 ENCOUNTER — Telehealth: Payer: Self-pay | Admitting: *Deleted

## 2019-03-21 DIAGNOSIS — Z431 Encounter for attention to gastrostomy: Secondary | ICD-10-CM | POA: Diagnosis not present

## 2019-03-21 DIAGNOSIS — M1990 Unspecified osteoarthritis, unspecified site: Secondary | ICD-10-CM | POA: Diagnosis not present

## 2019-03-21 DIAGNOSIS — Z483 Aftercare following surgery for neoplasm: Secondary | ICD-10-CM | POA: Diagnosis not present

## 2019-03-21 DIAGNOSIS — C140 Malignant neoplasm of pharynx, unspecified: Secondary | ICD-10-CM | POA: Diagnosis not present

## 2019-03-21 DIAGNOSIS — I10 Essential (primary) hypertension: Secondary | ICD-10-CM | POA: Diagnosis not present

## 2019-03-21 DIAGNOSIS — C329 Malignant neoplasm of larynx, unspecified: Secondary | ICD-10-CM | POA: Diagnosis not present

## 2019-03-21 DIAGNOSIS — E039 Hypothyroidism, unspecified: Secondary | ICD-10-CM | POA: Diagnosis not present

## 2019-03-21 DIAGNOSIS — H547 Unspecified visual loss: Secondary | ICD-10-CM | POA: Diagnosis not present

## 2019-03-21 DIAGNOSIS — C4442 Squamous cell carcinoma of skin of scalp and neck: Secondary | ICD-10-CM | POA: Diagnosis not present

## 2019-03-21 MED ORDER — MAGIC MOUTHWASH: 5.0000 mL | Freq: Three times a day (TID) | ORAL | 1 refills | Status: AC | PRN

## 2019-03-21 NOTE — Telephone Encounter (Signed)
Verbal given to [place].  Patient aware

## 2019-03-21 NOTE — Telephone Encounter (Signed)
VM from [place] w/ [facility] Pt received the Nystatin suspension for thrush Mouth is still sore & dry Can Magic mouthwash be called to [place] Please advise

## 2019-03-21 NOTE — Telephone Encounter (Signed)
Yes go ahead and count in Magic mouthwash the pharmacy, I do not know the exact formulation so he may actually just have to call the pharmacy and ask them how they typically do it.

## 2019-03-22 ENCOUNTER — Encounter (HOSPITAL_COMMUNITY): Admitting: Dietician

## 2019-03-22 DIAGNOSIS — C140 Malignant neoplasm of pharynx, unspecified: Secondary | ICD-10-CM | POA: Diagnosis not present

## 2019-03-22 DIAGNOSIS — I10 Essential (primary) hypertension: Secondary | ICD-10-CM | POA: Diagnosis not present

## 2019-03-22 DIAGNOSIS — Z431 Encounter for attention to gastrostomy: Secondary | ICD-10-CM | POA: Diagnosis not present

## 2019-03-22 DIAGNOSIS — H547 Unspecified visual loss: Secondary | ICD-10-CM | POA: Diagnosis not present

## 2019-03-22 DIAGNOSIS — Z483 Aftercare following surgery for neoplasm: Secondary | ICD-10-CM | POA: Diagnosis not present

## 2019-03-22 DIAGNOSIS — M1990 Unspecified osteoarthritis, unspecified site: Secondary | ICD-10-CM | POA: Diagnosis not present

## 2019-03-22 DIAGNOSIS — C4442 Squamous cell carcinoma of skin of scalp and neck: Secondary | ICD-10-CM | POA: Diagnosis not present

## 2019-03-22 DIAGNOSIS — E039 Hypothyroidism, unspecified: Secondary | ICD-10-CM | POA: Diagnosis not present

## 2019-03-22 DIAGNOSIS — C329 Malignant neoplasm of larynx, unspecified: Secondary | ICD-10-CM | POA: Diagnosis not present

## 2019-03-24 ENCOUNTER — Other Ambulatory Visit: Payer: Self-pay

## 2019-03-24 ENCOUNTER — Other Ambulatory Visit (HOSPITAL_COMMUNITY): Payer: Self-pay | Admitting: Nurse Practitioner

## 2019-03-25 ENCOUNTER — Other Ambulatory Visit (HOSPITAL_COMMUNITY)

## 2019-03-25 ENCOUNTER — Telehealth (HOSPITAL_COMMUNITY): Payer: Self-pay

## 2019-03-25 ENCOUNTER — Inpatient Hospital Stay (HOSPITAL_BASED_OUTPATIENT_CLINIC_OR_DEPARTMENT_OTHER): Payer: Medicare HMO | Admitting: Hematology

## 2019-03-25 ENCOUNTER — Inpatient Hospital Stay (HOSPITAL_COMMUNITY): Payer: Medicare HMO

## 2019-03-25 ENCOUNTER — Encounter (HOSPITAL_COMMUNITY): Payer: Self-pay | Admitting: Hematology

## 2019-03-25 VITALS — BP 101/61 | HR 88 | Temp 97.6°F | Resp 18

## 2019-03-25 DIAGNOSIS — C329 Malignant neoplasm of larynx, unspecified: Secondary | ICD-10-CM

## 2019-03-25 DIAGNOSIS — Z8546 Personal history of malignant neoplasm of prostate: Secondary | ICD-10-CM

## 2019-03-25 DIAGNOSIS — R42 Dizziness and giddiness: Secondary | ICD-10-CM | POA: Diagnosis not present

## 2019-03-25 DIAGNOSIS — Z931 Gastrostomy status: Secondary | ICD-10-CM

## 2019-03-25 DIAGNOSIS — R918 Other nonspecific abnormal finding of lung field: Secondary | ICD-10-CM | POA: Diagnosis not present

## 2019-03-25 DIAGNOSIS — Z79899 Other long term (current) drug therapy: Secondary | ICD-10-CM

## 2019-03-25 DIAGNOSIS — Z923 Personal history of irradiation: Secondary | ICD-10-CM

## 2019-03-25 DIAGNOSIS — Z87891 Personal history of nicotine dependence: Secondary | ICD-10-CM

## 2019-03-25 DIAGNOSIS — I10 Essential (primary) hypertension: Secondary | ICD-10-CM | POA: Diagnosis not present

## 2019-03-25 DIAGNOSIS — Z5112 Encounter for antineoplastic immunotherapy: Secondary | ICD-10-CM | POA: Diagnosis not present

## 2019-03-25 DIAGNOSIS — C4442 Squamous cell carcinoma of skin of scalp and neck: Secondary | ICD-10-CM

## 2019-03-25 DIAGNOSIS — C760 Malignant neoplasm of head, face and neck: Secondary | ICD-10-CM

## 2019-03-25 DIAGNOSIS — Z9221 Personal history of antineoplastic chemotherapy: Secondary | ICD-10-CM

## 2019-03-25 DIAGNOSIS — E039 Hypothyroidism, unspecified: Secondary | ICD-10-CM | POA: Diagnosis not present

## 2019-03-25 DIAGNOSIS — M542 Cervicalgia: Secondary | ICD-10-CM | POA: Diagnosis not present

## 2019-03-25 DIAGNOSIS — Z800 Family history of malignant neoplasm of digestive organs: Secondary | ICD-10-CM

## 2019-03-25 LAB — COMPREHENSIVE METABOLIC PANEL
ALT: 107 U/L — ABNORMAL HIGH (ref 0–44)
AST: 57 U/L — ABNORMAL HIGH (ref 15–41)
Albumin: 3.7 g/dL (ref 3.5–5.0)
Alkaline Phosphatase: 97 U/L (ref 38–126)
Anion gap: 11 (ref 5–15)
BILIRUBIN TOTAL: 0.4 mg/dL (ref 0.3–1.2)
BUN: 12 mg/dL (ref 8–23)
CO2: 28 mmol/L (ref 22–32)
CREATININE: 0.58 mg/dL — AB (ref 0.61–1.24)
Calcium: 9.5 mg/dL (ref 8.9–10.3)
Chloride: 88 mmol/L — ABNORMAL LOW (ref 98–111)
GFR calc Af Amer: >60 mL/min (ref >60)
GFR calc non Af Amer: >60 mL/min (ref >60)
Glucose, Bld: 144 mg/dL — ABNORMAL HIGH (ref 70–99)
Potassium: 3.9 mmol/L (ref 3.5–5.1)
Sodium: 127 mmol/L — ABNORMAL LOW (ref 135–145)
Total Protein: 7.7 g/dL (ref 6.5–8.1)

## 2019-03-25 LAB — CBC WITH DIFFERENTIAL/PLATELET
Abs Immature Granulocytes: 0.01 10*3/uL (ref 0.00–0.07)
Basophils Absolute: 0 10*3/uL (ref 0.0–0.1)
Basophils Relative: 1 %
Eosinophils Absolute: 0 10*3/uL (ref 0.0–0.5)
Eosinophils Relative: 0 %
HCT: 40.7 % (ref 39.0–52.0)
HEMOGLOBIN: 12.6 g/dL — AB (ref 13.0–17.0)
Immature Granulocytes: 0 %
LYMPHS ABS: 0.6 10*3/uL — AB (ref 0.7–4.0)
Lymphocytes Relative: 12 %
MCH: 25.8 pg — ABNORMAL LOW (ref 26.0–34.0)
MCHC: 31 g/dL (ref 30.0–36.0)
MCV: 83.4 fL (ref 80.0–100.0)
MONOS PCT: 10 %
Monocytes Absolute: 0.5 10*3/uL (ref 0.1–1.0)
Neutro Abs: 4.1 10*3/uL (ref 1.7–7.7)
Neutrophils Relative %: 77 %
Platelets: 313 10*3/uL (ref 150–400)
RBC: 4.88 MIL/uL (ref 4.22–5.81)
RDW: 15.8 % — ABNORMAL HIGH (ref 11.5–15.5)
WBC: 5.3 10*3/uL (ref 4.0–10.5)
nRBC: 0 % (ref 0.0–0.2)

## 2019-03-25 LAB — TSH: TSH: 23.838 u[IU]/mL — ABNORMAL HIGH (ref 0.350–4.500)

## 2019-03-25 MED ORDER — SODIUM CHLORIDE 0.9 % IV SOLN
200.0000 mg | Freq: Once | INTRAVENOUS | Status: AC
  Administered 2019-03-25: 200 mg via INTRAVENOUS
  Filled 2019-03-25: qty 8

## 2019-03-25 MED ORDER — SODIUM CHLORIDE 0.9 % IV SOLN
Freq: Once | INTRAVENOUS | Status: AC
  Administered 2019-03-25: 11:00:00 via INTRAVENOUS

## 2019-03-25 MED ORDER — HEPARIN SOD (PORK) LOCK FLUSH 100 UNIT/ML IV SOLN: 500.0000 [IU] | Freq: Once | INTRAVENOUS | Status: DC | PRN

## 2019-03-25 MED ORDER — SODIUM CHLORIDE 0.9% FLUSH
10.0000 mL | INTRAVENOUS | Status: DC | PRN
  Administered 2019-03-25: 10 mL
  Filled 2019-03-25: qty 10

## 2019-03-25 NOTE — Progress Notes (Signed)
Brandon Hall, [place] [id number]   CLINIC:  Medical Oncology/Hematology  PCP:  Dettinger, Fransisca Kaufmann, MD [place] [id number] [phone]   REASON FOR VISIT:  Follow-up for INVASIVE SQUAMOUS CELL CARCINOMAof the larynx, POORLY DIFFERENTIATED  CURRENT THERAPY: Pembrolizumab every 3 weeks.     BRIEF ONCOLOGIC HISTORY:    Squamous cell carcinoma of head and neck (HCC)   05/05/2014 Imaging    CT scan of the neck show enlarged left lymph node measure less than 6 cm.    05/25/2014 Pathology Results    [phone] is positive for squamous cell carcinoma.    05/25/2014 Procedure    Lymph node biopsy of the left neck came back squamous cell carcinoma.    06/20/2014 Imaging    PET CT scan show no evidence of distant metastatic disease.    06/29/2014 Surgery    Laryngoscopy and random biopsy of the nasopharynx was negative for malignancy.    07/27/2014 - 09/27/2014 Chemotherapy    Cisplatin + 5FU (days 1-5) every 28 days 4 cycles    11/06/2014 - 12/25/2014 Radiation Therapy    In [place] with Dr. Isidore Moos    04/25/2015 Imaging    PET - Restaging.  No evidence of hypermetabolic recurrent or metastatic disease.    05/17/2015 Pathology Results    [id number]:  p16 (HPV) negative.    02/08/2019 -  Chemotherapy    The patient had pembrolizumab (KEYTRUDA) 200 mg in sodium chloride 0.9 % 50 mL chemo infusion, 200 mg, Intravenous, Once, 3 of 6 cycles Administration: 200 mg (02/08/2019), 200 mg (03/04/2019), 200 mg (03/25/2019)  for chemotherapy treatment.      Laryngeal cancer ([place])   08/19/2018 Initial Diagnosis    Laryngeal cancer ([place])    02/08/2019 -  Chemotherapy    The patient had pembrolizumab (KEYTRUDA) 200 mg in sodium chloride 0.9 % 50 mL chemo infusion, 200 mg, Intravenous, Once, 3 of 6 cycles Administration: 200 mg (02/08/2019), 200 mg (03/04/2019), 200 mg (03/25/2019)  for chemotherapy treatment.       CANCER STAGING: Cancer Staging  Squamous cell carcinoma of head and neck (HCC) Staging form: Pharynx - Nasopharynx, AJCC 7th Edition - Clinical: Stage IVB (TX, N3a, M0) - Signed by Eppie Gibson, MD on 07/13/2014    INTERVAL HISTORY:  Brandon Hall 63 y.o. male returns for routine follow-up and consideration for next cycle of chemotherapy.He is here today alone. He states that he still has cough but no changes. Denies any nausea, vomiting, or diarrhea. Denies any new pains. Had not noticed any recent bleeding such as epistaxis, hematuria or hematochezia. Denies recent chest pain on exertion, shortness of breath on minimal exertion, pre-syncopal episodes, or palpitations. Denies any numbness or tingling in hands or feet. Denies any recent fevers, infections, or recent hospitalizations. Patient reports appetite at 0% (using feeding tube) and energy level at 100%.     REVIEW OF SYSTEMS:  Review of Systems  Respiratory: Positive for cough.   Musculoskeletal: Positive for neck pain.  All other systems reviewed and are negative.    PAST MEDICAL/SURGICAL HISTORY:  Past Medical History:  Diagnosis Date  . Arthritis   . Blindness of right eye   . Cancer ([place])    pharyngeal ca  . History of radiation therapy 11/2014   [id number] squamous cell carcinoma of L neck, Stage IVB, unknown primary 70 Gy in 35 fractions treated at [facility], finished 12/25/14  . History of shingles  RASH  02-03-2014  PER PCP RESOLVING  . Hypertension    NO MEDS ,  MONITORED BY PCP  . Hypothyroidism, postradioiodine therapy   . Prostate cancer ([place]) 07/12/2014   DX  SEVERAL  YRS   AGO  WITH RADIATION THERAPY  . Squamous cell carcinoma of head and neck ([place]) 07/12/2014  . 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 COLONOSCOPY  10/20/2011   Procedure: COLONOSCOPY;  Surgeon: Daneil Dolin, MD;  [facility];  Service: Endoscopy;  Laterality: N/A;  1:00 pm  . COLONOSCOPY  N/A 02/03/2018   Procedure: COLONOSCOPY;  Surgeon: Daneil Dolin, MD;  [facility];  Service: Endoscopy;  Laterality: N/A;  2:15  . CRYOABLATION N/A 04/07/2014   Procedure: SALVAGE CRYO ABLATION PROSTATE;  Surgeon: Lowella Bandy, MD;  [facility];  Service: Urology;  Laterality: N/A;  . DIRECT LARYNGOSCOPY N/A 09/06/2018   Procedure: DIRECT LARYNGOSCOPY biopsy with frozen section;  Surgeon: Izora Gala, MD;  Location: [facility];  Service: ENT;  Laterality: N/A;  . IR GASTROSTOMY TUBE MOD SED  08/20/2018  . IR GENERIC HISTORICAL  03/27/2017   IR REMOVAL TUN ACCESS W/ PORT W/O FL MOD SED [facility]-INTER[facility] RAD  . LARYNGETOMY N/A 09/06/2018   Procedure: total LARYNGECTOMY;  Surgeon: Izora Gala, MD;  Location: [facility];  Service: ENT;  Laterality: N/A;  . LARYNGOSCOPY    . LYMPH NODE BIOPSY    . RADICAL NECK DISSECTION Right 09/06/2018   Procedure: Modified NECK DISSECTION;  Surgeon: Izora Gala, MD;  Location: [facility];  Service: ENT;  Laterality: Right;     SOCIAL HISTORY:  Social History   Socioeconomic History  . Marital status: Married    Spouse name: Not on file  . Number of children: Not on file  . Years of education: Not on file  . Highest education level: Not on file  Occupational History  . Occupation: disabled  Social Needs  . Financial resource strain: Not very hard  . Food insecurity:    Worry: Never true    Inability: Never true  . Transportation needs:    Medical: No    Non-medical: No  Tobacco Use  . Smoking status: Former Smoker    Packs/day: 1.00    Years: 40.00    Pack years: 40.00    Types: Cigarettes    Last attempt to quit: 09/29/2013    Years since quitting: 5.4  . Smokeless tobacco: Never Used  Substance and Sexual Activity  . Alcohol use: No  . Drug use: No  . Sexual activity: Not on file  Lifestyle  . Physical activity:    Days per week: 3 days    Minutes per session: 30 min  . Stress: Only a little  Relationships  . Social  connections:    Talks on phone: Never    Gets together: More than three times a week    Attends religious service: More than 4 times per year    Active member of club or organization: Yes    Attends meetings of clubs or organizations: More than 4 times per year    Relationship status: Married  . Intimate partner violence:    Fear of current or ex partner: No    Emotionally abused: No    Physically abused: No    Forced sexual activity: No  Other Topics Concern  . Not on file  Social History  Narrative   Lives with wife in an apartment. He has stepchildren. They walk most everywhere they go locally but have access to transportation if needed.    FAMILY HISTORY:  Family History  Problem Relation Age of Onset  . Colon cancer Father   . Hypertension Mother   . Colon cancer Brother   . Colon cancer Brother     CURRENT MEDICATIONS:  Outpatient Encounter Medications as of 03/25/2019  Medication Sig  . hydrochlorothiazide (HYDRODIURIL) 12.5 MG tablet Take 1 tablet (12.5 mg total) by mouth daily.  Marland Kitchen HYDROcodone-acetaminophen (HYCET) 7.5-325 mg/15 ml solution TAKE (2) TEASPOONFULS EVERY FOUR HOURS AS NEEDED FOR PAIN.  Marland Kitchen levothyroxine (SYNTHROID, LEVOTHROID) 100 MCG tablet Place 1 tablet (100 mcg total) into feeding tube daily before breakfast. (Patient taking differently: Take 100 mcg by mouth daily before breakfast. )  . magic mouthwash SOLN Take 5 mLs by mouth 3 (three) times daily as needed for mouth pain.  Marland Kitchen nystatin (MYCOSTATIN) 100000 UNIT/ML suspension Take 5 mLs (500,000 Units total) by mouth 4 (four) times daily.  . Pembrolizumab (KEYTRUDA IV) Inject into the vein every 21 ( twenty-one) days.  . polyethylene glycol (MIRALAX / GLYCOLAX) packet Take 17 g by mouth daily as needed for mild constipation or moderate constipation.   . pravastatin (PRAVACHOL) 40 MG tablet Take 1 tablet (40 mg total) by mouth daily.  . prochlorperazine (COMPAZINE) 10 MG tablet Take 1 tablet (10 mg total) by  mouth every 6 (six) hours as needed (Nausea or vomiting).  . Vitamin D, Ergocalciferol, (DRISDOL) 1.25 MG (50000 UT) CAPS capsule TAKE 1 CAPSULE EVERY 7 DAYS.  . [DISCONTINUED] HYDROcodone-acetaminophen (HYCET) 7.5-325 mg/15 ml solution TAKE (2) TEASPOONFULS EVERY FOUR HOURS AS NEEDED FOR PAIN.   No facility-administered encounter medications on file as of 03/25/2019.     ALLERGIES:  No Known Allergies   PHYSICAL EXAM:  ECOG Performance status: 1  Vitals:   03/25/19 0924  BP: 135/68  Pulse: 95  Resp: 18  Temp: (!) 96.8 F (36 C)  SpO2: 97%   Filed Weights   03/25/19 0924  Weight: 120 lb (54.4 kg)    Physical Exam Constitutional:      Appearance: Normal appearance.  Neck:     Comments: Tracheotomy site is within normal limits. Cardiovascular:     Rate and Rhythm: Normal rate and regular rhythm.     Heart sounds: Normal heart sounds.  Pulmonary:     Effort: Pulmonary effort is normal.     Breath sounds: Normal breath sounds.  Abdominal:     Palpations: Abdomen is soft. There is no mass.  Skin:    General: Skin is warm.  Neurological:     General: No focal deficit present.     Mental Status: He is alert and oriented to person, place, and time.  Psychiatric:        Mood and Affect: Mood normal.        Behavior: Behavior normal.      LABORATORY DATA:  I have reviewed the labs as listed.  CBC    Component Value Date/Time   WBC 5.3 03/25/2019 0941   RBC 4.88 03/25/2019 0941   HGB 12.6 (L) 03/25/2019 0941   HGB 14.8 11/11/2018 1147   HCT 40.7 03/25/2019 0941   HCT 47.0 11/11/2018 1147   PLT 313 03/25/2019 0941   PLT 180 11/11/2018 1147   MCV 83.4 03/25/2019 0941   MCV 78 (L) 11/11/2018 1147   MCH 25.8 (L) 03/25/2019 4010  MCHC 31.0 03/25/2019 0941   RDW 15.8 (H) 03/25/2019 0941   RDW 17.8 (H) 11/11/2018 1147   LYMPHSABS 0.6 (L) 03/25/2019 0941   LYMPHSABS 1.1 11/11/2018 1147   MONOABS 0.5 03/25/2019 0941   EOSABS 0.0 03/25/2019 0941   EOSABS 0.0  11/11/2018 1147   BASOSABS 0.0 03/25/2019 0941   BASOSABS 0.0 11/11/2018 1147   CMP Latest Ref Rng & Units 03/25/2019 03/04/2019 02/16/2019  Glucose 70 - 99 mg/dL 144(H) 154(H) 125(H)  BUN 8 - 23 mg/dL 12 12 11   Creatinine 0.61 - 1.24 mg/dL 0.58(L) 0.64 0.62  Sodium 135 - 145 mmol/L 127(L) 129(L) 134(L)  Potassium 3.5 - 5.1 mmol/L 3.9 4.0 3.5  Chloride 98 - 111 mmol/L 88(L) 90(L) 93(L)  CO2 22 - 32 mmol/L 28 30 28   Calcium 8.9 - 10.3 mg/dL 9.5 9.0 9.4  Total Protein 6.5 - 8.1 g/dL 7.7 7.2 8.0  Total Bilirubin 0.3 - 1.2 mg/dL 0.4 0.5 0.4  Alkaline Phos 38 - 126 U/L 97 95 73  AST 15 - 41 U/L 57(H) 57(H) 33  ALT 0 - 44 U/L 107(H) 81(H) 24       DIAGNOSTIC IMAGING:  I have independently reviewed the scans and discussed with the patient.   I have reviewed Venita Lick LPN's note and agree with the documentation.  I personally performed a face-to-face visit, made revisions and my assessment and plan is as follows.    ASSESSMENT & PLAN:   Squamous cell carcinoma of head and neck 1.  Recurrent squamous cell carcinoma of the head and neck: -Patient with 40-pack-year smoking history, quit in 2015. 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 - PET CT scan on 08/06/2018 shows large recurrent hypermetabolic right supraglottic mass with single hypermetabolic level 2 lymph node on the right. - On 09/06/2018, he underwent total laryngectomy and right neck dissection of levels 2, 3, and 4. - We discussed the pathology which showed invasive squamous cell carcinoma, poorly differentiated, spanning 3.9 cm, carcinoma involves skeletal muscle, thyroid cartilage and cricoid cartilage.  Positive lymphovascular invasion and perineural invasion.  No evidence of carcinoma in 4 out of 4 lymph nodes. - He was reportedly placed in hospice by his  pulmonologist 2 to 3 days ago. - We discussed the results of the CT soft tissue neck on 12/23/2018 showing large enhancing mass in the region of the right tonsil, new and measures 28 x 24 mm.  Ring-enhancing fluid collection in the anterior neck below the base of the tongue is also new.  This could represent recurrent tumor or postop fluid.  Enlarging enhancing right level 2B lymph node measuring 14 x 21 mm compatible with tumor growth.  8 mm necrotic lymph node in the left anterior mid neck compatible with tumor.  15 mm necrotic node in the left lower neck compatible with tumor.  12 mm spiculated mass in the left apex new compared to prior studies.  4 mm and 5 mm left upper lobe nodules are also new. - Patient is in fairly good performance status.  He did not experience any major side effects from prior chemotherapy in 2015.  He does all his ADLs and IADLs. -His appetite has been declining.  He has not been using PEG tube since October 2019.  He is not able to swallow solid foods.  We will make a referral  to our dietitian. -We have reviewed results of PDL 1 testing.  CPS score is 1%. -PET/CT scan on 01/26/2019 shows bulky intensely hypermetabolic tissue in the posterior right oropharynx consistent with squamous cell carcinoma recurrence.  Bilateral hypermetabolic nodal metastasis within the cervical lymph nodes.  No evidence of metastatic disease outside the neck. -Based on this, we have recommended single agent pembrolizumab (keynote-048 study).  Objective responses are quoted around 20%. - He started cycle 1 of Keytruda on 02/08/2019. - He received cycle 2 on 03/04/2019.  He tolerated it very well.  Denied any immunotherapy related side effects. -Denies any fevers, night sweats or weight loss. -I have reviewed his blood work which is acceptable for cycle 3. -He will come back in 3 weeks for cycle 4.  I plan to repeat scans, likely a PET scan after 4 cycles.  2.  Nutrition: -He is taking Osmolite 1.5, 1  can every 4 hours.  He is also drinking boost 1 to 2 cans/day.  3.  Neck pain: - He has some right neck pain. - He is taking hydrocodone 7.5 mg per 15 mL, 10 mL's every 4 hours as needed.  This is fairly well controlled on this regimen.  4.  Hypothyroidism: -She is on Synthroid 100 mcg daily. -His last TSH 3 weeks ago was 21.  We repeated it again today which was 18.  Hence I will increase Synthroid to 150 mcg.       Orders placed this encounter:  No orders of the defined types were placed in this encounter.     Derek Jack, MD [place] [phone]

## 2019-03-25 NOTE — Assessment & Plan Note (Signed)
1.  Recurrent squamous cell carcinoma of the head and neck: -Patient with 40-pack-year smoking history, quit in 2015. 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 - PET CT scan on 08/06/2018 shows large recurrent hypermetabolic right supraglottic mass with single hypermetabolic level 2 lymph node on the right. - On 09/06/2018, he underwent total laryngectomy and right neck dissection of levels 2, 3, and 4. - We discussed the pathology which showed invasive squamous cell carcinoma, poorly differentiated, spanning 3.9 cm, carcinoma involves skeletal muscle, thyroid cartilage and cricoid cartilage.  Positive lymphovascular invasion and perineural invasion.  No evidence of carcinoma in 4 out of 4 lymph nodes. - He was reportedly placed in hospice by his pulmonologist 2 to 3 days ago. - We discussed the results of the CT soft tissue neck on 12/23/2018 showing large enhancing mass in the region of the right tonsil, new and measures 28 x 24 mm.  Ring-enhancing fluid collection in the anterior neck below the base of the tongue is also new.  This could represent recurrent tumor or postop fluid.  Enlarging enhancing right level 2B lymph node measuring 14 x 21 mm compatible with tumor growth.  8 mm necrotic lymph node in the left anterior mid neck compatible with tumor.  15 mm necrotic node in the left lower neck compatible with tumor.  12 mm spiculated mass in the left apex new compared to prior studies.  4 mm and 5 mm left upper lobe nodules are also new. - Patient is in fairly good performance status.  He did not experience any major side effects from prior chemotherapy in 2015.  He does all his ADLs and IADLs. -His appetite has been declining.  He has not been using PEG tube since October 2019.  He is not able to swallow solid foods.  We will make a referral to  our dietitian. -We have reviewed results of PDL 1 testing.  CPS score is 1%. -PET/CT scan on 01/26/2019 shows bulky intensely hypermetabolic tissue in the posterior right oropharynx consistent with squamous cell carcinoma recurrence.  Bilateral hypermetabolic nodal metastasis within the cervical lymph nodes.  No evidence of metastatic disease outside the neck. -Based on this, we have recommended single agent pembrolizumab (keynote-048 study).  Objective responses are quoted around 20%. - He started cycle 1 of Keytruda on 02/08/2019. - He received cycle 2 on 03/04/2019.  He tolerated it very well.  Denied any immunotherapy related side effects. -Denies any fevers, night sweats or weight loss. -I have reviewed his blood work which is acceptable for cycle 3. -He will come back in 3 weeks for cycle 4.  I plan to repeat scans, likely a PET scan after 4 cycles.  2.  Nutrition: -He is taking Osmolite 1.5, 1 can every 4 hours.  He is also drinking boost 1 to 2 cans/day.  3.  Neck pain: - He has some right neck pain. - He is taking hydrocodone 7.5 mg per 15 mL, 10 mL's every 4 hours as needed.  This is fairly well controlled on this regimen.  4.  Hypothyroidism: -She is on Synthroid 100 mcg daily. -His last TSH 3 weeks ago was 21.  We repeated it again today which was 18.  Hence I will increase Synthroid to 150 mcg.

## 2019-03-25 NOTE — Progress Notes (Signed)
Patient seen by Dr. Delton Coombes with lab review and ok to treat verbal order.   Patient tolerated treatment with no complaints voiced.  Peripheral IV site clean and dry with good blood return noted after therapy.  No bruising or swelling noted at site.  Patient denied dizziness, nausea, and lightheadedness with post treatment blood pressures. Patient warm and dry.  Alert and oriented.  Ambulated to wheelchair with steady gait.  Patient discharged by wheelchair to family with no s/s of distress noted.

## 2019-03-25 NOTE — Telephone Encounter (Signed)
Patient's niece, Brayton Layman, called stating when they got home the patient had a seizure when he got out of the car.  She described them as "spasming" and she also stated his eyes rolled back in his head and he took a nap when the "spasming" was finished.  Instructed the niece the patient needs to be evaluated in the emergency room with understanding verbalized.  Dr. Delton Coombes was notified.

## 2019-03-25 NOTE — Patient Instructions (Addendum)
[place] at [facility] Discharge Instructions  You were seen today by Dr. Delton Coombes. He went over your recent lab results looked good except your TSH. He is going to increase your Synthroid to 150 mcg daily.  He will see you back in 3 weeks for labs, Treatment and follow up.   Thank you for choosing [place] at [facility] to provide your oncology and hematology care.  To afford each patient quality time with our provider, please arrive at least 15 minutes before your scheduled appointment time.   If you have a lab appointment with the [facility] please come in thru the  Main Entrance and check in at the main information desk  You need to re-schedule your appointment should you arrive 10 or more minutes late.  We strive to give you quality time with our providers, and arriving late affects you and other patients whose appointments are after yours.  Also, if you no show three or more times for appointments you may be dismissed from the clinic at the providers discretion.     Again, thank you for choosing [facility].  Our hope is that these requests will decrease the amount of time that you wait before being seen by our physicians.       _____________________________________________________________  Should you have questions after your visit to [facility], please contact our office at [phone] between the hours of 8:00 a.m. and 4:30 p.m.  Voicemails left after 4:00 p.m. will not be returned until the following business day.  For prescription refill requests, have your pharmacy contact our office and allow 72 hours.    Cancer Center Support Programs:   > Cancer Support Group  2nd Tuesday of the month 1pm-2pm, Journey Room

## 2019-03-25 NOTE — Progress Notes (Signed)
Nutrition Follow-up:  Patient with laryngeal carcinoma dx in 2015.  Recurred in mid 2019 s/p total laryngectomy 9/9.  Was gradually weaned off PEG feeding, with tube left in place.  Patient now with recurrence, hospice has been revoked and started [place].    RD working remotely due to COVID-19 virus.    Called and spoke with wife Stanton Kidney via phone.  Wife reports patient is taking 4 cartons of osmolite 1.5 daily, with 91ml water flush before and after.  Wife hard to follow at times, gets off track.  Reports she is doing the best she can.  Wants to get a CNA because a her shoulder is hurt and she has a hard time taking care of herself and patient.  Reports home health nurse will be coming back on Monday 3/30.  Reports that she give medication via the tube as well.  Can't tell RD how much or when she is giving medicine.  "It is all written on my paper and I can't find my paper right now."  Reports patient takes a little bit of food and water through mouth.  Reports he is drinking 1/2-2 ensure/boost daily.  "Sometimes he just gets full and does not want anymore."    Medications: reviewed  Labs: Na 127  Anthropometrics:   Weight 120 lb today increased from 118 lb on 03/04/2019   Estimated Energy Needs  Kcals: 1750-2000 Protein: 78-94 g Fluid: 1.6-1.8 L/d  NUTRITION DIAGNOSIS: moderate malnutrition continues   MALNUTRITION DIAGNOSIS: moderate malnutrition continues   INTERVENTION:  Recommend wife continue to give osmolite 1.5, 4 cartons per day (8am, noon, 4pm and 8pm).   Encouraged drinking of ensure/boost 2 per day if patient can tolerate.  With ensure/boost and tube feeding will provide 1860 calories, 77 g protein and 1426 ml free water.      MONITORING, EVALUATION, GOAL: weight trends, intake, TF   NEXT VISIT: April 17   Arlesia Kiel B. Zenia Resides, [place], [place] Registered Dietitian [phone] (pager)

## 2019-03-25 NOTE — Patient Instructions (Signed)
[facility] at [facility]  Discharge Instructions:   _______________________________________________________________  Thank you for choosing [facility] at [facility] to provide your oncology and hematology care.  To afford each patient quality time with our providers, please arrive at least 15 minutes before your scheduled appointment.  You need to re-schedule your appointment if you arrive 10 or more minutes late.  We strive to give you quality time with our providers, and arriving late affects you and other patients whose appointments are after yours.  Also, if you no show three or more times for appointments you may be dismissed from the clinic.  Again, thank you for choosing  Can[facility] Center at [facility]. Our hope is that these requests will allow you access to exceptional care and in a timely manner. _______________________________________________________________  If you have questions after your visit, please contact our office at [phone] between the hours of 8:30 a.m. and 5:00 p.m. Voicemails left after 4:30 p.m. will not be returned until the following business day. _______________________________________________________________  For prescription refill requests, have your pharmacy contact our office. _______________________________________________________________  Recommendations made by the consultant and any test results will be sent to your referring physician. _______________________________________________________________ 

## 2019-03-25 NOTE — Progress Notes (Signed)
Patient seen by Dr. Katragadda with lab review and ok to treat today.  

## 2019-03-27 ENCOUNTER — Encounter (HOSPITAL_COMMUNITY): Payer: Self-pay | Admitting: Emergency Medicine

## 2019-03-27 ENCOUNTER — Emergency Department (HOSPITAL_COMMUNITY): Payer: Medicare HMO

## 2019-03-27 ENCOUNTER — Other Ambulatory Visit: Payer: Self-pay

## 2019-03-27 ENCOUNTER — Emergency Department (HOSPITAL_COMMUNITY)
Admission: EM | Admit: 2019-03-27 | Discharge: 2019-03-27 | Disposition: A | Discharge status: Home / Self Care | Payer: Medicare HMO | Attending: Emergency Medicine | Admitting: Emergency Medicine

## 2019-03-27 DIAGNOSIS — Z87891 Personal history of nicotine dependence: Secondary | ICD-10-CM | POA: Insufficient documentation

## 2019-03-27 DIAGNOSIS — R55 Syncope and collapse: Secondary | ICD-10-CM | POA: Diagnosis not present

## 2019-03-27 DIAGNOSIS — I10 Essential (primary) hypertension: Secondary | ICD-10-CM | POA: Insufficient documentation

## 2019-03-27 DIAGNOSIS — G9389 Other specified disorders of brain: Secondary | ICD-10-CM | POA: Diagnosis not present

## 2019-03-27 DIAGNOSIS — Z85818 Personal history of malignant neoplasm of other sites of lip, oral cavity, and pharynx: Secondary | ICD-10-CM | POA: Insufficient documentation

## 2019-03-27 DIAGNOSIS — Z79899 Other long term (current) drug therapy: Secondary | ICD-10-CM | POA: Insufficient documentation

## 2019-03-27 DIAGNOSIS — Z85828 Personal history of other malignant neoplasm of skin: Secondary | ICD-10-CM | POA: Diagnosis not present

## 2019-03-27 DIAGNOSIS — I959 Hypotension, unspecified: Secondary | ICD-10-CM | POA: Diagnosis not present

## 2019-03-27 DIAGNOSIS — E039 Hypothyroidism, unspecified: Secondary | ICD-10-CM | POA: Diagnosis not present

## 2019-03-27 DIAGNOSIS — R0902 Hypoxemia: Secondary | ICD-10-CM | POA: Diagnosis not present

## 2019-03-27 DIAGNOSIS — Z8546 Personal history of malignant neoplasm of prostate: Secondary | ICD-10-CM | POA: Diagnosis not present

## 2019-03-27 DIAGNOSIS — R404 Transient alteration of awareness: Secondary | ICD-10-CM | POA: Diagnosis not present

## 2019-03-27 LAB — CBC WITH DIFFERENTIAL/PLATELET
Abs Immature Granulocytes: 0.02 10*3/uL (ref 0.00–0.07)
Basophils Absolute: 0 10*3/uL (ref 0.0–0.1)
Basophils Relative: 0 %
Eosinophils Absolute: 0 10*3/uL (ref 0.0–0.5)
Eosinophils Relative: 0 %
HCT: 37.9 % — ABNORMAL LOW (ref 39.0–52.0)
Hemoglobin: 11.5 g/dL — ABNORMAL LOW (ref 13.0–17.0)
Immature Granulocytes: 0 %
Lymphocytes Relative: 9 %
Lymphs Abs: 0.5 10*3/uL — ABNORMAL LOW (ref 0.7–4.0)
MCH: 25.7 pg — ABNORMAL LOW (ref 26.0–34.0)
MCHC: 30.3 g/dL (ref 30.0–36.0)
MCV: 84.8 fL (ref 80.0–100.0)
Monocytes Absolute: 0.5 10*3/uL (ref 0.1–1.0)
Monocytes Relative: 9 %
Neutro Abs: 4.2 10*3/uL (ref 1.7–7.7)
Neutrophils Relative %: 82 %
Platelets: 287 10*3/uL (ref 150–400)
RBC: 4.47 MIL/uL (ref 4.22–5.81)
RDW: 15.4 % (ref 11.5–15.5)
WBC: 5.2 10*3/uL (ref 4.0–10.5)
nRBC: 0 % (ref 0.0–0.2)

## 2019-03-27 LAB — COMPREHENSIVE METABOLIC PANEL
ALT: 90 U/L — ABNORMAL HIGH (ref 0–44)
AST: 59 U/L — ABNORMAL HIGH (ref 15–41)
Albumin: 3.3 g/dL — ABNORMAL LOW (ref 3.5–5.0)
Alkaline Phosphatase: 88 U/L (ref 38–126)
Anion gap: 10 (ref 5–15)
BUN: 14 mg/dL (ref 8–23)
CO2: 28 mmol/L (ref 22–32)
Calcium: 8.4 mg/dL — ABNORMAL LOW (ref 8.9–10.3)
Chloride: 89 mmol/L — ABNORMAL LOW (ref 98–111)
Creatinine, Ser: 0.53 mg/dL — ABNORMAL LOW (ref 0.61–1.24)
GFR calc Af Amer: >60 mL/min (ref >60)
GFR calc non Af Amer: >60 mL/min (ref >60)
Glucose, Bld: 119 mg/dL — ABNORMAL HIGH (ref 70–99)
Potassium: 4.1 mmol/L (ref 3.5–5.1)
Sodium: 127 mmol/L — ABNORMAL LOW (ref 135–145)
Total Bilirubin: 0.4 mg/dL (ref 0.3–1.2)
Total Protein: 6.7 g/dL (ref 6.5–8.1)

## 2019-03-27 LAB — CBG MONITORING, ED: Glucose-Capillary: 90 mg/dL (ref 70–99)

## 2019-03-27 LAB — TSH: TSH: 0.694 u[IU]/mL (ref 0.350–4.500)

## 2019-03-27 MED ORDER — SODIUM CHLORIDE 0.9 % IV BOLUS
1000.0000 mL | Freq: Once | INTRAVENOUS | Status: AC
  Administered 2019-03-27: 1000 mL via INTRAVENOUS

## 2019-03-27 NOTE — ED Notes (Signed)
Spoke with pts wife

## 2019-03-27 NOTE — ED Notes (Signed)
Patient back from CT.

## 2019-03-27 NOTE — ED Triage Notes (Signed)
Patient brought in via EMS for possible seizure?LOC. Patient alert upon arrival. Patient nonverbal due to throat cancer with old trach stoma. Per paramedic patient's family member states LOC was "only a few minutes but unable to give an approx estimation." EKG normal, CBG 115, but patient hypotensive with highest systolic reading was 72 per EMS. Patient "did not"  Have any vomiting and was not incontinent of stool or urine per paramedic. Family denied any hx of seizures but patient was brought to ER previously for same reason and diagnosed with seizure like activity.

## 2019-03-27 NOTE — ED Notes (Signed)
Patient waiting on transportation. Family contacted

## 2019-03-27 NOTE — ED Provider Notes (Signed)
[facility] EMERGENCY DEPARTMENT Provider Note   CSN: [id number] Arrival date & time: 03/27/19  1714    History   Chief Complaint Chief Complaint  Patient presents with   Loss of Consciousness    HPI Brandon Hall is a 63 y.o. male.     Level 5 caveat for inability to speak.  Most of history obtained from nursing notes.  Patient is normally nonverbal secondary to throat cancer and an old trach stoma.  Patient apparently passed out for a "few minutes".  He then awoke and was reasonably normal.  Initial glucose 115.  No obvious postictal stage, urinary or bowel incontinence.  No previous history of seizures.  Patient is now back to baseline.     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, Stage IVB, unknown primary 70 Gy in 35 fractions treated at [facility], finished 12/25/14   History of shingles    RASH  02-03-2014  PER PCP RESOLVING   Hypertension    NO MEDS ,  MONITORED BY PCP   Hypothyroidism, postradioiodine therapy    Prostate cancer ([place]) 07/12/2014   DX  SEVERAL  YRS   AGO  WITH RADIATION THERAPY   Squamous cell carcinoma of head and neck ([place]) 07/12/2014   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    Patient Active Problem List   Diagnosis Date Noted   Goals of care, counseling/discussion 01/31/2019   DNR (do not resuscitate) 01/11/2019   Cachexia ([place]) 01/11/2019   Lung nodule 01/11/2019   Status post laryngectomy 09/06/2018   Protein-calorie malnutrition, severe 08/20/2018   Laryngeal cancer ([place]) 08/19/2018   Perianal abscess 08/10/2017   Vitamin D deficiency 02/08/2015   Squamous cell carcinoma of head and neck ([place]) 07/12/2014   History of prostate cancer 07/12/2014   Shingles rash 02/03/2014   Abnormal transaminases 10/02/2013   Hypothyroid 10/02/2013   Blind  right eye 09/30/2013   Dyslipidemia 09/30/2013   Hypertension    Graves disease     Past Surgical History:  Procedure Laterality Date   COLONOSCOPY  10/20/2011   Procedure: COLONOSCOPY;  Surgeon: Daneil Dolin, MD;  [facility];  Service: Endoscopy;  Laterality: N/A;  1:00 pm   COLONOSCOPY N/A 02/03/2018   Procedure: COLONOSCOPY;  Surgeon: Daneil Dolin, MD;  [facility];  Service: Endoscopy;  Laterality: N/A;  2:15   CRYOABLATION N/A 04/07/2014   Procedure: SALVAGE CRYO ABLATION PROSTATE;  Surgeon: Lowella Bandy, MD;  Location: [facility];  Service: Urology;  Laterality: N/A;   DIRECT LARYNGOSCOPY N/A 09/06/2018   Procedure: DIRECT LARYNGOSCOPY biopsy with frozen section;  Surgeon: Izora Gala, MD;  Location: [facility];  Service: ENT;  Laterality: N/A;   IR GASTROSTOMY TUBE MOD SED  08/20/2018   IR GENERIC HISTORICAL  03/27/2017   IR REMOVAL TUN ACCESS W/ PORT W/O FL MOD SED [facility]-INTERV RAD   LARYNGETOMY N/A 09/06/2018   Procedure: total LARYNGECTOMY;  Surgeon: Izora Gala, MD;  Location: [place];  Service: ENT;  Laterality: N/A;   LARYNGOSCOPY     LYMPH NODE BIOPSY     RADICAL NECK DISSECTION Right 09/06/2018   Procedure: Modified NECK DISSECTION;  Surgeon: Izora Gala, MD;  Location: [place];  Service: ENT;  Laterality: Right;  Home Medications    Prior to Admission medications   Medication Sig Start Date End Date Taking? Authorizing Provider  feeding supplement (OSMOLITE 1 CAL) LIQD Place 237 mLs into feeding tube 4 (four) times daily.   Yes [provider]  hydrochlorothiazide (HYDRODIURIL) 12.5 MG tablet Take 1 tablet (12.5 mg total) by mouth daily. Patient taking differently: Place 12.5 mg into feeding tube daily.  11/11/18  Yes Dettinger, Fransisca Kaufmann, MD  HYDROcodone-acetaminophen (HYCET) 7.5-325 mg/15 ml solution TAKE (2) TEASPOONFULS EVERY FOUR HOURS AS NEEDED FOR PAIN. Patient taking differently: Place 10 mLs into feeding  tube every 4 (four) hours as needed for moderate pain or severe pain.  03/25/19  Yes Lockamy, Randi L, NP-C  levothyroxine (SYNTHROID, LEVOTHROID) 100 MCG tablet Place 1 tablet (100 mcg total) into feeding tube daily before breakfast. 11/11/18  Yes Dettinger, Fransisca Kaufmann, MD  magic mouthwash SOLN Take 5 mLs by mouth 3 (three) times daily as needed for mouth pain. 03/21/19  Yes Dettinger, Fransisca Kaufmann, MD  Pembrolizumab [facility] IV) Inject 200 mg into the vein every 21 ( twenty-one) days.    Yes [provider]  polyethylene glycol (MIRALAX / GLYCOLAX) packet Take 17 g by mouth daily as needed for mild constipation or moderate constipation.    Yes [provider]  pravastatin (PRAVACHOL) 40 MG tablet Take 1 tablet (40 mg total) by mouth daily. Patient taking differently: Place 40 mg into feeding tube daily.  11/11/18  Yes Dettinger, Fransisca Kaufmann, MD  prochlorperazine (COMPAZINE) 10 MG tablet Take 1 tablet (10 mg total) by mouth every 6 (six) hours as needed (Nausea or vomiting). 02/07/19  Yes Derek Jack, MD  Vitamin D, Ergocalciferol, (DRISDOL) 1.25 MG (50000 UT) CAPS capsule TAKE 1 CAPSULE EVERY 7 DAYS. Patient taking differently: Place 50,000 Units into feeding tube every 7 (seven) days.  02/23/19  Yes Dettinger, Fransisca Kaufmann, MD  nystatin (MYCOSTATIN) 100000 UNIT/ML suspension Take 5 mLs (500,000 Units total) by mouth 4 (four) times daily. Patient not taking: Reported on 03/27/2019 03/16/19   Dettinger, Fransisca Kaufmann, MD    Family History Family History  Problem Relation Age of Onset   Colon cancer Father    Hypertension Mother    Colon cancer Brother    Colon cancer Brother     Social History Social History   Tobacco Use   Smoking status: Former Smoker    Packs/day: 1.00    Years: 40.00    Pack years: 40.00    Types: Cigarettes    Last attempt to quit: 09/29/2013    Years since quitting: 5.4   Smokeless tobacco: Never Used  Substance Use Topics   Alcohol use: No    Drug use: No     Allergies   Patient has no known allergies.   Review of Systems Review of Systems  All other systems reviewed and are negative.    Physical Exam Updated Vital Signs BP 115/85    Pulse (!) 103    Resp 14    Ht 6\' 1"  (1.854 m)    Wt 54.4 kg    SpO2 99%    BMI 15.83 kg/m   Physical Exam Vitals signs and nursing note reviewed.  Constitutional:      Appearance: He is well-developed.     Comments: Nonverbal, alert  HENT:     Head: Normocephalic and atraumatic.  Eyes:     Conjunctiva/sclera: Conjunctivae normal.  Neck:     Musculoskeletal: Neck supple.  Comments: Trach stoma Cardiovascular:     Rate and Rhythm: Normal rate and regular rhythm.  Pulmonary:     Effort: Pulmonary effort is normal.     Breath sounds: Normal breath sounds.  Abdominal:     General: Bowel sounds are normal.     Palpations: Abdomen is soft.  Musculoskeletal: Normal range of motion.  Skin:    General: Skin is warm and dry.  Neurological:     Mental Status: He is alert and oriented to person, place, and time.  Psychiatric:        Behavior: Behavior normal.      ED Treatments / Results  Labs (all labs ordered are listed, but only abnormal results are displayed) Labs Reviewed  CBC WITH DIFFERENTIAL/PLATELET - Abnormal; Notable for the following components:      Result Value   Hemoglobin 11.5 (*)    HCT 37.9 (*)    MCH 25.7 (*)    Lymphs Abs 0.5 (*)    All other components within normal limits  COMPREHENSIVE METABOLIC PANEL - Abnormal; Notable for the following components:   Sodium 127 (*)    Chloride 89 (*)    Glucose, Bld 119 (*)    Creatinine, Ser 0.53 (*)    Calcium 8.4 (*)    Albumin 3.3 (*)    AST 59 (*)    ALT 90 (*)    All other components within normal limits  TSH  CBG MONITORING, ED    EKG None  Radiology Ct Head Wo Contrast  Result Date: 03/27/2019 CLINICAL DATA:  Questionable seizure. Abnormal neurologic exam. Nontraumatic. EXAM: CT HEAD  WITHOUT CONTRAST TECHNIQUE: Contiguous axial images were obtained from the base of the skull through the vertex without intravenous contrast. COMPARISON:  02/16/2019 FINDINGS: Brain: No evidence of acute infarction, hemorrhage, hydrocephalus, extra-axial collection or mass lesion/mass effect. Prominence of the sulci and ventricles compatible with brain atrophy. Unchanged appearance of 1.2 cm calcified lesion in the anterior inferior right cerebellum. Vascular: No hyperdense vessel or unexpected calcification. Skull: Normal. Negative for fracture or focal lesion. Sinuses/Orbits: Bilateral maxillary sinus mucosal thickening identified. Air-fluid level within the right maxillary sinus also noted. Mastoid air cells are clear. Other: None. IMPRESSION: 1. No acute intracranial abnormality. 2. Unchanged 1.3 cm calcified lesion in the right cerebellum. 3. Chronic brain atrophy. Electronically Signed   By: Kerby Moors M.D.   On: 03/27/2019 20:01    Procedures Procedures (including critical care time)  Medications Ordered in ED Medications  sodium chloride 0.9 % bolus 1,000 mL (0 mLs Intravenous Stopped 03/27/19 1909)     Initial Impression / Assessment and Plan / ED Course  I have reviewed the triage vital signs and the nursing notes.  Pertinent labs & imaging results that were available during my care of the patient were reviewed by me and considered in my medical decision making (see chart for details).        Patient is not exhibiting any obvious neurological deficits.  Hemoglobin, EKG, CT head showed no acute findings.  Patient was observed for several hours with no change in his status.  Patient will be worked up as an outpatient.  Final Clinical Impressions(s) / ED Diagnoses   Final diagnoses:  Syncope, unspecified syncope type    ED Discharge Orders    None       Nat Christen, MD 03/27/19 2129

## 2019-03-27 NOTE — ED Notes (Signed)
Patient given discharge instructions. Patient unable to sign for himself due to disability.

## 2019-03-27 NOTE — Discharge Instructions (Addendum)
Tests including CT head showed no life-threatening condition.  Follow-up with your primary care doctor.

## 2019-03-28 DIAGNOSIS — Z483 Aftercare following surgery for neoplasm: Secondary | ICD-10-CM | POA: Diagnosis not present

## 2019-03-28 DIAGNOSIS — M1990 Unspecified osteoarthritis, unspecified site: Secondary | ICD-10-CM | POA: Diagnosis not present

## 2019-03-28 DIAGNOSIS — Z431 Encounter for attention to gastrostomy: Secondary | ICD-10-CM | POA: Diagnosis not present

## 2019-03-28 DIAGNOSIS — C4442 Squamous cell carcinoma of skin of scalp and neck: Secondary | ICD-10-CM | POA: Diagnosis not present

## 2019-03-28 DIAGNOSIS — H547 Unspecified visual loss: Secondary | ICD-10-CM | POA: Diagnosis not present

## 2019-03-28 DIAGNOSIS — E039 Hypothyroidism, unspecified: Secondary | ICD-10-CM | POA: Diagnosis not present

## 2019-03-28 DIAGNOSIS — I10 Essential (primary) hypertension: Secondary | ICD-10-CM | POA: Diagnosis not present

## 2019-03-28 DIAGNOSIS — C329 Malignant neoplasm of larynx, unspecified: Secondary | ICD-10-CM | POA: Diagnosis not present

## 2019-03-28 DIAGNOSIS — C140 Malignant neoplasm of pharynx, unspecified: Secondary | ICD-10-CM | POA: Diagnosis not present

## 2019-03-29 DIAGNOSIS — Z483 Aftercare following surgery for neoplasm: Secondary | ICD-10-CM | POA: Diagnosis not present

## 2019-03-29 DIAGNOSIS — I10 Essential (primary) hypertension: Secondary | ICD-10-CM | POA: Diagnosis not present

## 2019-03-29 DIAGNOSIS — M1990 Unspecified osteoarthritis, unspecified site: Secondary | ICD-10-CM | POA: Diagnosis not present

## 2019-03-29 DIAGNOSIS — H547 Unspecified visual loss: Secondary | ICD-10-CM | POA: Diagnosis not present

## 2019-03-29 DIAGNOSIS — C140 Malignant neoplasm of pharynx, unspecified: Secondary | ICD-10-CM | POA: Diagnosis not present

## 2019-03-29 DIAGNOSIS — C329 Malignant neoplasm of larynx, unspecified: Secondary | ICD-10-CM | POA: Diagnosis not present

## 2019-03-29 DIAGNOSIS — Z431 Encounter for attention to gastrostomy: Secondary | ICD-10-CM | POA: Diagnosis not present

## 2019-03-29 DIAGNOSIS — E039 Hypothyroidism, unspecified: Secondary | ICD-10-CM | POA: Diagnosis not present

## 2019-03-29 DIAGNOSIS — C4442 Squamous cell carcinoma of skin of scalp and neck: Secondary | ICD-10-CM | POA: Diagnosis not present

## 2019-04-01 ENCOUNTER — Other Ambulatory Visit (HOSPITAL_COMMUNITY): Payer: Self-pay | Admitting: Nurse Practitioner

## 2019-04-01 DIAGNOSIS — C140 Malignant neoplasm of pharynx, unspecified: Secondary | ICD-10-CM | POA: Diagnosis not present

## 2019-04-01 DIAGNOSIS — C4442 Squamous cell carcinoma of skin of scalp and neck: Secondary | ICD-10-CM | POA: Diagnosis not present

## 2019-04-01 DIAGNOSIS — M1990 Unspecified osteoarthritis, unspecified site: Secondary | ICD-10-CM | POA: Diagnosis not present

## 2019-04-01 DIAGNOSIS — E039 Hypothyroidism, unspecified: Secondary | ICD-10-CM | POA: Diagnosis not present

## 2019-04-01 DIAGNOSIS — C329 Malignant neoplasm of larynx, unspecified: Secondary | ICD-10-CM | POA: Diagnosis not present

## 2019-04-01 DIAGNOSIS — Z431 Encounter for attention to gastrostomy: Secondary | ICD-10-CM | POA: Diagnosis not present

## 2019-04-01 DIAGNOSIS — H547 Unspecified visual loss: Secondary | ICD-10-CM | POA: Diagnosis not present

## 2019-04-01 DIAGNOSIS — I10 Essential (primary) hypertension: Secondary | ICD-10-CM | POA: Diagnosis not present

## 2019-04-01 DIAGNOSIS — Z483 Aftercare following surgery for neoplasm: Secondary | ICD-10-CM | POA: Diagnosis not present

## 2019-04-07 DIAGNOSIS — C329 Malignant neoplasm of larynx, unspecified: Secondary | ICD-10-CM | POA: Diagnosis not present

## 2019-04-07 DIAGNOSIS — C140 Malignant neoplasm of pharynx, unspecified: Secondary | ICD-10-CM | POA: Diagnosis not present

## 2019-04-07 DIAGNOSIS — Z483 Aftercare following surgery for neoplasm: Secondary | ICD-10-CM | POA: Diagnosis not present

## 2019-04-07 DIAGNOSIS — H547 Unspecified visual loss: Secondary | ICD-10-CM | POA: Diagnosis not present

## 2019-04-07 DIAGNOSIS — I10 Essential (primary) hypertension: Secondary | ICD-10-CM | POA: Diagnosis not present

## 2019-04-07 DIAGNOSIS — E039 Hypothyroidism, unspecified: Secondary | ICD-10-CM | POA: Diagnosis not present

## 2019-04-07 DIAGNOSIS — Z431 Encounter for attention to gastrostomy: Secondary | ICD-10-CM | POA: Diagnosis not present

## 2019-04-07 DIAGNOSIS — C4442 Squamous cell carcinoma of skin of scalp and neck: Secondary | ICD-10-CM | POA: Diagnosis not present

## 2019-04-07 DIAGNOSIS — M1990 Unspecified osteoarthritis, unspecified site: Secondary | ICD-10-CM | POA: Diagnosis not present

## 2019-04-11 ENCOUNTER — Other Ambulatory Visit: Payer: Self-pay | Admitting: Family Medicine

## 2019-04-11 ENCOUNTER — Other Ambulatory Visit (HOSPITAL_COMMUNITY): Payer: Self-pay | Admitting: Nurse Practitioner

## 2019-04-11 DIAGNOSIS — Z483 Aftercare following surgery for neoplasm: Secondary | ICD-10-CM | POA: Diagnosis not present

## 2019-04-11 DIAGNOSIS — C140 Malignant neoplasm of pharynx, unspecified: Secondary | ICD-10-CM | POA: Diagnosis not present

## 2019-04-11 DIAGNOSIS — Z431 Encounter for attention to gastrostomy: Secondary | ICD-10-CM | POA: Diagnosis not present

## 2019-04-11 DIAGNOSIS — I10 Essential (primary) hypertension: Secondary | ICD-10-CM | POA: Diagnosis not present

## 2019-04-11 DIAGNOSIS — E039 Hypothyroidism, unspecified: Secondary | ICD-10-CM | POA: Diagnosis not present

## 2019-04-11 DIAGNOSIS — C329 Malignant neoplasm of larynx, unspecified: Secondary | ICD-10-CM | POA: Diagnosis not present

## 2019-04-11 DIAGNOSIS — M1990 Unspecified osteoarthritis, unspecified site: Secondary | ICD-10-CM | POA: Diagnosis not present

## 2019-04-11 DIAGNOSIS — C4442 Squamous cell carcinoma of skin of scalp and neck: Secondary | ICD-10-CM | POA: Diagnosis not present

## 2019-04-11 DIAGNOSIS — H547 Unspecified visual loss: Secondary | ICD-10-CM | POA: Diagnosis not present

## 2019-04-14 ENCOUNTER — Emergency Department (HOSPITAL_COMMUNITY): Payer: Medicare HMO

## 2019-04-14 ENCOUNTER — Encounter (HOSPITAL_COMMUNITY): Payer: Self-pay

## 2019-04-14 ENCOUNTER — Other Ambulatory Visit: Payer: Self-pay

## 2019-04-14 ENCOUNTER — Observation Stay (HOSPITAL_COMMUNITY): Payer: Medicare HMO

## 2019-04-14 ENCOUNTER — Observation Stay (HOSPITAL_COMMUNITY)
Admission: EM | Admit: 2019-04-14 | Discharge: 2019-04-15 | Disposition: A | Discharge status: Home / Self Care | Payer: Medicare HMO | Attending: Internal Medicine | Admitting: Internal Medicine

## 2019-04-14 DIAGNOSIS — R5381 Other malaise: Secondary | ICD-10-CM | POA: Diagnosis not present

## 2019-04-14 DIAGNOSIS — I10 Essential (primary) hypertension: Secondary | ICD-10-CM | POA: Diagnosis not present

## 2019-04-14 DIAGNOSIS — G9340 Encephalopathy, unspecified: Secondary | ICD-10-CM | POA: Insufficient documentation

## 2019-04-14 DIAGNOSIS — E872 Acidosis, unspecified: Secondary | ICD-10-CM

## 2019-04-14 DIAGNOSIS — Z79890 Hormone replacement therapy (postmenopausal): Secondary | ICD-10-CM | POA: Insufficient documentation

## 2019-04-14 DIAGNOSIS — C760 Malignant neoplasm of head, face and neck: Secondary | ICD-10-CM | POA: Diagnosis not present

## 2019-04-14 DIAGNOSIS — E559 Vitamin D deficiency, unspecified: Secondary | ICD-10-CM | POA: Insufficient documentation

## 2019-04-14 DIAGNOSIS — Z930 Tracheostomy status: Secondary | ICD-10-CM | POA: Diagnosis not present

## 2019-04-14 DIAGNOSIS — Z681 Body mass index (BMI) 19 or less, adult: Secondary | ICD-10-CM | POA: Diagnosis not present

## 2019-04-14 DIAGNOSIS — R410 Disorientation, unspecified: Secondary | ICD-10-CM

## 2019-04-14 DIAGNOSIS — R6884 Jaw pain: Secondary | ICD-10-CM | POA: Diagnosis present

## 2019-04-14 DIAGNOSIS — E039 Hypothyroidism, unspecified: Secondary | ICD-10-CM | POA: Diagnosis not present

## 2019-04-14 DIAGNOSIS — R0602 Shortness of breath: Secondary | ICD-10-CM | POA: Diagnosis not present

## 2019-04-14 DIAGNOSIS — Z66 Do not resuscitate: Secondary | ICD-10-CM | POA: Insufficient documentation

## 2019-04-14 DIAGNOSIS — E43 Unspecified severe protein-calorie malnutrition: Secondary | ICD-10-CM | POA: Insufficient documentation

## 2019-04-14 DIAGNOSIS — R000 Tachycardia, unspecified: Secondary | ICD-10-CM | POA: Diagnosis present

## 2019-04-14 DIAGNOSIS — Z87891 Personal history of nicotine dependence: Secondary | ICD-10-CM | POA: Diagnosis not present

## 2019-04-14 DIAGNOSIS — E871 Hypo-osmolality and hyponatremia: Secondary | ICD-10-CM | POA: Insufficient documentation

## 2019-04-14 DIAGNOSIS — R609 Edema, unspecified: Secondary | ICD-10-CM | POA: Diagnosis not present

## 2019-04-14 DIAGNOSIS — R112 Nausea with vomiting, unspecified: Secondary | ICD-10-CM | POA: Diagnosis not present

## 2019-04-14 DIAGNOSIS — J690 Pneumonitis due to inhalation of food and vomit: Secondary | ICD-10-CM | POA: Diagnosis not present

## 2019-04-14 DIAGNOSIS — E785 Hyperlipidemia, unspecified: Secondary | ICD-10-CM | POA: Diagnosis not present

## 2019-04-14 DIAGNOSIS — Z85818 Personal history of malignant neoplasm of other sites of lip, oral cavity, and pharynx: Secondary | ICD-10-CM | POA: Diagnosis not present

## 2019-04-14 DIAGNOSIS — Z931 Gastrostomy status: Secondary | ICD-10-CM | POA: Insufficient documentation

## 2019-04-14 DIAGNOSIS — Z79899 Other long term (current) drug therapy: Secondary | ICD-10-CM | POA: Diagnosis not present

## 2019-04-14 DIAGNOSIS — Z8546 Personal history of malignant neoplasm of prostate: Secondary | ICD-10-CM | POA: Diagnosis not present

## 2019-04-14 DIAGNOSIS — Z20828 Contact with and (suspected) exposure to other viral communicable diseases: Secondary | ICD-10-CM | POA: Diagnosis not present

## 2019-04-14 DIAGNOSIS — R110 Nausea: Secondary | ICD-10-CM | POA: Diagnosis not present

## 2019-04-14 DIAGNOSIS — R05 Cough: Secondary | ICD-10-CM | POA: Diagnosis not present

## 2019-04-14 DIAGNOSIS — E860 Dehydration: Secondary | ICD-10-CM | POA: Diagnosis not present

## 2019-04-14 DIAGNOSIS — Z8589 Personal history of malignant neoplasm of other organs and systems: Secondary | ICD-10-CM | POA: Insufficient documentation

## 2019-04-14 DIAGNOSIS — Z923 Personal history of irradiation: Secondary | ICD-10-CM | POA: Insufficient documentation

## 2019-04-14 DIAGNOSIS — R1110 Vomiting, unspecified: Secondary | ICD-10-CM

## 2019-04-14 DIAGNOSIS — G9389 Other specified disorders of brain: Secondary | ICD-10-CM | POA: Diagnosis not present

## 2019-04-14 DIAGNOSIS — H5461 Unqualified visual loss, right eye, normal vision left eye: Secondary | ICD-10-CM | POA: Diagnosis not present

## 2019-04-14 LAB — COMPREHENSIVE METABOLIC PANEL
ALT: 63 U/L — ABNORMAL HIGH (ref 0–44)
AST: 50 U/L — ABNORMAL HIGH (ref 15–41)
Albumin: 3.9 g/dL (ref 3.5–5.0)
Alkaline Phosphatase: 103 U/L (ref 38–126)
Anion gap: 12 (ref 5–15)
BUN: 13 mg/dL (ref 8–23)
CO2: 27 mmol/L (ref 22–32)
Calcium: 9.6 mg/dL (ref 8.9–10.3)
Chloride: 90 mmol/L — ABNORMAL LOW (ref 98–111)
Creatinine, Ser: 0.6 mg/dL — ABNORMAL LOW (ref 0.61–1.24)
GFR calc Af Amer: >60 mL/min (ref >60)
GFR calc non Af Amer: >60 mL/min (ref >60)
Glucose, Bld: 124 mg/dL — ABNORMAL HIGH (ref 70–99)
Potassium: 4.3 mmol/L (ref 3.5–5.1)
Sodium: 129 mmol/L — ABNORMAL LOW (ref 135–145)
Total Bilirubin: 0.8 mg/dL (ref 0.3–1.2)
Total Protein: 8.1 g/dL (ref 6.5–8.1)

## 2019-04-14 LAB — CBC WITH DIFFERENTIAL/PLATELET
Abs Immature Granulocytes: 0.02 10*3/uL (ref 0.00–0.07)
Basophils Absolute: 0 10*3/uL (ref 0.0–0.1)
Basophils Relative: 0 %
Eosinophils Absolute: 0.1 10*3/uL (ref 0.0–0.5)
Eosinophils Relative: 1 %
HCT: 44.4 % (ref 39.0–52.0)
Hemoglobin: 14.3 g/dL (ref 13.0–17.0)
Immature Granulocytes: 0 %
Lymphocytes Relative: 9 %
Lymphs Abs: 0.5 10*3/uL — ABNORMAL LOW (ref 0.7–4.0)
MCH: 26.1 pg (ref 26.0–34.0)
MCHC: 32.2 g/dL (ref 30.0–36.0)
MCV: 81.2 fL (ref 80.0–100.0)
Monocytes Absolute: 0.5 10*3/uL (ref 0.1–1.0)
Monocytes Relative: 9 %
Neutro Abs: 4.8 10*3/uL (ref 1.7–7.7)
Neutrophils Relative %: 81 %
Platelets: 298 10*3/uL (ref 150–400)
RBC: 5.47 MIL/uL (ref 4.22–5.81)
RDW: 14.5 % (ref 11.5–15.5)
WBC: 5.9 10*3/uL (ref 4.0–10.5)
nRBC: 0 % (ref 0.0–0.2)

## 2019-04-14 LAB — URINALYSIS, ROUTINE W REFLEX MICROSCOPIC
Bilirubin Urine: NEGATIVE
Glucose, UA: NEGATIVE mg/dL
Hgb urine dipstick: NEGATIVE
Ketones, ur: NEGATIVE mg/dL
Leukocytes,Ua: NEGATIVE
Nitrite: NEGATIVE
Protein, ur: NEGATIVE mg/dL
Specific Gravity, Urine: 1.01 (ref 1.005–1.030)
pH: 8 (ref 5.0–8.0)

## 2019-04-14 LAB — LACTIC ACID, PLASMA
Lactic Acid, Venous: 3.3 mmol/L (ref 0.5–1.9)
Lactic Acid, Venous: 3.3 mmol/L (ref 0.5–1.9)
Lactic Acid, Venous: 2.7 mmol/L (ref 0.5–1.9)

## 2019-04-14 LAB — SARS CORONAVIRUS 2 BY RT PCR (HOSPITAL ORDER, PERFORMED IN ~~LOC~~ HOSPITAL LAB): SARS Coronavirus 2: NEGATIVE

## 2019-04-14 MED ORDER — PIPERACILLIN-TAZOBACTAM 3.375 G IVPB 30 MIN
3.3750 g | Freq: Once | INTRAVENOUS | Status: DC
  Filled 2019-04-14: qty 50

## 2019-04-14 MED ORDER — HEPARIN SODIUM (PORCINE) 5000 UNIT/ML IJ SOLN
5000.0000 [IU] | Freq: Three times a day (TID) | INTRAMUSCULAR | Status: DC
  Administered 2019-04-14 – 2019-04-15 (×3): 5000 [IU] via SUBCUTANEOUS
  Filled 2019-04-14 (×3): qty 1

## 2019-04-14 MED ORDER — ONDANSETRON HCL 4 MG PO TABS: 4.0000 mg | ORAL_TABLET | Freq: Four times a day (QID) | ORAL | Status: DC | PRN

## 2019-04-14 MED ORDER — SODIUM CHLORIDE 0.9 % IV SOLN
INTRAVENOUS | Status: AC
  Administered 2019-04-14 – 2019-04-15 (×2): via INTRAVENOUS

## 2019-04-14 MED ORDER — SODIUM CHLORIDE 0.9 % IV BOLUS
500.0000 mL | Freq: Once | INTRAVENOUS | Status: AC
  Administered 2019-04-14: 17:00:00 500 mL via INTRAVENOUS

## 2019-04-14 MED ORDER — SODIUM CHLORIDE 0.9 % IV SOLN
1.0000 g | Freq: Once | INTRAVENOUS | Status: AC
  Administered 2019-04-14: 16:00:00 1 g via INTRAVENOUS
  Filled 2019-04-14: qty 10

## 2019-04-14 MED ORDER — SODIUM CHLORIDE 0.9 % IV BOLUS: 500.0000 mL | Freq: Once | INTRAVENOUS | Status: DC

## 2019-04-14 MED ORDER — SODIUM CHLORIDE 0.9 % IV BOLUS
500.0000 mL | Freq: Once | INTRAVENOUS | Status: AC
  Administered 2019-04-14: 18:00:00 500 mL via INTRAVENOUS

## 2019-04-14 MED ORDER — SODIUM CHLORIDE 0.9 % IV BOLUS
1000.0000 mL | Freq: Once | INTRAVENOUS | Status: AC
  Administered 2019-04-14: 14:00:00 1000 mL via INTRAVENOUS

## 2019-04-14 MED ORDER — POLYETHYLENE GLYCOL 3350 17 G PO PACK: 17.0000 g | PACK | Freq: Every day | ORAL | Status: DC | PRN

## 2019-04-14 MED ORDER — LORAZEPAM 2 MG/ML IJ SOLN
0.5000 mg | Freq: Four times a day (QID) | INTRAMUSCULAR | Status: DC | PRN
  Administered 2019-04-14: 20:00:00 0.5 mg via INTRAVENOUS
  Filled 2019-04-14: qty 1

## 2019-04-14 MED ORDER — JEVITY 1.2 CAL PO LIQD
237.0000 mL | Freq: Four times a day (QID) | ORAL | Status: DC
  Administered 2019-04-15 (×2): 237 mL
  Filled 2019-04-14 (×4): qty 1000
  Filled 2019-04-14: qty 237
  Filled 2019-04-14 (×3): qty 1000
  Filled 2019-04-14: qty 237
  Filled 2019-04-14 (×3): qty 1000
  Filled 2019-04-14: qty 237
  Filled 2019-04-14 (×2): qty 1000

## 2019-04-14 MED ORDER — IOHEXOL 350 MG/ML SOLN
100.0000 mL | Freq: Once | INTRAVENOUS | Status: AC | PRN
  Administered 2019-04-14: 100 mL via INTRAVENOUS

## 2019-04-14 MED ORDER — HYDROCODONE-ACETAMINOPHEN 7.5-325 MG/15ML PO SOLN: 10.0000 mL | ORAL | Status: DC | PRN

## 2019-04-14 MED ORDER — LEVOTHYROXINE SODIUM 100 MCG PO TABS
100.0000 ug | ORAL_TABLET | Freq: Every day | ORAL | Status: DC
  Administered 2019-04-15: 05:00:00 100 ug
  Filled 2019-04-14: qty 1

## 2019-04-14 MED ORDER — SODIUM CHLORIDE 0.9 % IV SOLN
500.0000 mg | Freq: Once | INTRAVENOUS | Status: AC
  Administered 2019-04-14: 17:00:00 500 mg via INTRAVENOUS
  Filled 2019-04-14: qty 500

## 2019-04-14 MED ORDER — NYSTATIN 100000 UNIT/ML MT SUSP
5.0000 mL | Freq: Four times a day (QID) | OROMUCOSAL | Status: DC
  Administered 2019-04-14 – 2019-04-15 (×3): 500000 [IU] via ORAL
  Filled 2019-04-14 (×2): qty 5

## 2019-04-14 MED ORDER — LORAZEPAM 2 MG/ML IJ SOLN: 1.0000 mg | Freq: Once | INTRAMUSCULAR | Status: DC

## 2019-04-14 MED ORDER — ONDANSETRON HCL 4 MG/2ML IJ SOLN: 4.0000 mg | Freq: Four times a day (QID) | INTRAMUSCULAR | Status: DC | PRN

## 2019-04-14 MED ORDER — PRAVASTATIN SODIUM 40 MG PO TABS
40.0000 mg | ORAL_TABLET | Freq: Every day | ORAL | Status: DC
  Administered 2019-04-14: 40 mg
  Filled 2019-04-14: qty 1

## 2019-04-14 MED ORDER — LORAZEPAM 2 MG/ML IJ SOLN
0.5000 mg | Freq: Once | INTRAMUSCULAR | Status: AC
  Administered 2019-04-14: 17:00:00 0.5 mg via INTRAVENOUS
  Filled 2019-04-14: qty 1

## 2019-04-14 MED ORDER — ACETAMINOPHEN 325 MG PO TABS: 650.0000 mg | ORAL_TABLET | Freq: Four times a day (QID) | ORAL | Status: DC | PRN

## 2019-04-14 MED ORDER — ACETAMINOPHEN 650 MG RE SUPP: 650.0000 mg | Freq: Four times a day (QID) | RECTAL | Status: DC | PRN

## 2019-04-14 NOTE — ED Triage Notes (Addendum)
Patient reports having increased right sided jaw pain today, hx of jaw cancer.  Patient presents with nausea, and stated he has vomited twice today. Patient denies taking any medications at home to relieve pain or nausea. Patient is unable to communicate verbally, as he has a trach. Patient denies contact with anyone sick.

## 2019-04-14 NOTE — ED Notes (Signed)
Date and time results received: 04/14/19  (use smartphrase ".now" to insert current time)  Test: Lactic acid Critical Value: 2.7  Name of Provider Notified: Denton Brick, MD  Orders Received? Or Actions Taken?: none at this time.

## 2019-04-14 NOTE — Care Management Obs Status (Signed)
[facility] NOTIFICATION   Patient Details  Name: Didier Brandenburg MRN: [id number] Date of Birth: 1956/04/30   Medicare Observation Status Notification Given:  Other (see comment)(Will provide to nurse on the floor to provide to family )    [place], LCSW 04/14/2019, 9:51 PM

## 2019-04-14 NOTE — ED Provider Notes (Addendum)
[facility] Provider Note   CSN: [id number] Arrival date & time: 04/14/19  1252    History   Chief Complaint Chief Complaint  Patient presents with   Jaw Pain    HPI Brandon Hall is a 63 y.o. male.     Patient with history of radiation 2015, head neck cancer, Graves' disease presents with recurrent nausea and vomiting throughout today and worsening right jaw pain.  Patient does have a history of cancer in that right jaw.  Difficulty obtaining details has a trach.  Patient has not been around anyone that sick.  No fever however patient has had cough.  No chest pain or shortness of breath.     Past Medical History:  Diagnosis Date   Arthritis    Blindness of right eye    Cancer ([place])    pharyngeal ca   History of radiation therapy 11/2014   TxN3MO squamous cell carcinoma of L neck, Stage IVB, unknown primary 70 Gy in 35 fractions treated at [facility], finished 12/25/14   History of shingles    RASH  02-03-2014  PER PCP RESOLVING   Hypertension    NO MEDS ,  MONITORED BY PCP   Hypothyroidism, postradioiodine therapy    Prostate cancer ([place]) 07/12/2014   DX  SEVERAL  YRS   AGO  WITH RADIATION THERAPY   Squamous cell carcinoma of head and neck ([place]) 07/12/2014   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[place]) 06/29/14    Patient Active Problem List   Diagnosis Date Noted   Goals of care, counseling/discussion 01/31/2019   DNR (do not resuscitate) 01/11/2019   Cachexia ([place]) 01/11/2019   Lung nodule 01/11/2019   Status post laryngectomy 09/06/2018   Protein-calorie malnutrition, severe 08/20/2018   Laryngeal cancer ([place]) 08/19/2018   Perianal abscess 08/10/2017   Vitamin D deficiency 02/08/2015   Squamous cell carcinoma of head and neck ([place]) 07/12/2014   History of prostate cancer 07/12/2014   Shingles rash 02/03/2014   Abnormal transaminases 10/02/2013   Hypothyroid 10/02/2013   Blind right eye 09/30/2013    Dyslipidemia 09/30/2013   Hypertension    Graves disease     Past Surgical History:  Procedure Laterality Date   COLONOSCOPY  10/20/2011   Procedure: COLONOSCOPY;  Surgeon: Daneil Dolin, MD;  [facility];  Service: Endoscopy;  Laterality: N/A;  1:00 pm   COLONOSCOPY N/A 02/03/2018   Procedure: COLONOSCOPY;  Surgeon: Daneil Dolin, MD;  [facility];  Service: Endoscopy;  Laterality: N/A;  2:15   CRYOABLATION N/A 04/07/2014   Procedure: SALVAGE CRYO ABLATION PROSTATE;  Surgeon: Lowella Bandy, MD;  Location: [facility];  Service: Urology;  Laterality: N/A;   DIRECT LARYNGOSCOPY N/A 09/06/2018   Procedure: DIRECT LARYNGOSCOPY biopsy with frozen section;  Surgeon: Izora Gala, MD;  Location: [facility] [facility];  Service: ENT;  Laterality: N/A;   IR GASTROSTOMY TUBE MOD SED  08/20/2018   IR GENERIC HISTORICAL  03/27/2017   IR REMOVAL TUN ACCESS W/ PORT W/O FL MOD SED [facility]-INTERV RAD   LARYNGETOMY N/A 09/06/2018   Procedure: total LARYNGECTOMY;  Surgeon: Izora Gala, MD;  Location: [place];  Service: ENT;  Laterality: N/A;   LARYNGOSCOPY     LYMPH NODE BIOPSY     RADICAL NECK DISSECTION Right 09/06/2018   Procedure: Modified NECK DISSECTION;  Surgeon: Izora Gala, MD;  Location: [name];  Service: ENT;  Laterality: Right;        Home Medications  Prior to Admission medications   Medication Sig Start Date End Date Taking? Authorizing Provider  feeding supplement (OSMOLITE 1 CAL) LIQD Place 237 mLs into feeding tube 4 (four) times daily.   Yes [provider]  hydrochlorothiazide (HYDRODIURIL) 12.5 MG tablet Take 1 tablet (12.5 mg total) by mouth daily. Patient taking differently: Place 12.5 mg into feeding tube daily.  11/11/18  Yes Dettinger, Fransisca Kaufmann, MD  HYDROcodone-acetaminophen (HYCET) 7.5-325 mg/15 ml solution TAKE (2) TEASPOONFULS EVERY FOUR HOURS AS NEEDED FOR PAIN. Patient taking differently: Place 10 mLs into feeding tube every 4 (four) hours  as needed for moderate pain.  04/11/19  Yes Lockamy, Randi L, NP-C  levothyroxine (SYNTHROID, LEVOTHROID) 100 MCG tablet Place 1 tablet (100 mcg total) into feeding tube daily before breakfast. 11/11/18  Yes Dettinger, Fransisca Kaufmann, MD  magic mouthwash SOLN Take 5 mLs by mouth 3 (three) times daily as needed for mouth pain. 03/21/19  Yes Dettinger, Fransisca Kaufmann, MD  nystatin (MYCOSTATIN) 100000 UNIT/ML suspension Take 5 mLs (500,000 Units total) by mouth 4 (four) times daily. 03/16/19  Yes Dettinger, Fransisca Kaufmann, MD  Pembrolizumab [facility] IV) Inject 200 mg into the vein every 21 ( twenty-one) days.    Yes [provider]  polyethylene glycol (MIRALAX / GLYCOLAX) packet Take 17 g by mouth daily as needed for mild constipation or moderate constipation.    Yes [provider]  pravastatin (PRAVACHOL) 40 MG tablet Take 1 tablet (40 mg total) by mouth daily. Patient taking differently: Place 40 mg into feeding tube daily.  11/11/18  Yes Dettinger, Fransisca Kaufmann, MD  prochlorperazine (COMPAZINE) 10 MG tablet Take 1 tablet (10 mg total) by mouth every 6 (six) hours as needed (Nausea or vomiting). 02/07/19  Yes Derek Jack, MD  Vitamin D, Ergocalciferol, (DRISDOL) 1.25 MG (50000 UT) CAPS capsule TAKE 1 CAPSULE EVERY 7 DAYS. Patient taking differently: Place 50,000 Units into feeding tube every 7 (seven) days.  02/23/19  Yes Dettinger, Fransisca Kaufmann, MD    Family History Family History  Problem Relation Age of Onset   Colon cancer Father    Hypertension Mother    Colon cancer Brother    Colon cancer Brother     Social History Social History   Tobacco Use   Smoking status: Former Smoker    Packs/day: 1.00    Years: 40.00    Pack years: 40.00    Types: Cigarettes    Last attempt to quit: 09/29/2013    Years since quitting: 5.5   Smokeless tobacco: Never Used  Substance Use Topics   Alcohol use: No   Drug use: No     Allergies   Patient has no known allergies.   Review of  Systems Review of Systems  Unable to perform ROS: Patient nonverbal     Physical Exam Updated Vital Signs BP (!) 162/103    Pulse (!) 128    Temp 99.3 F (37.4 C) (Oral)    Resp 19    Ht 6\' 1"  (1.854 m)    Wt 54 kg    SpO2 95%    BMI 15.71 kg/m   Physical Exam Vitals signs and nursing note reviewed.  Constitutional:      Appearance: He is well-developed.  HENT:     Head: Normocephalic.     Comments: Dry mm No external sign of infection right jaw Eyes:     General:        Right eye: No discharge.  Left eye: No discharge.     Conjunctiva/sclera: Conjunctivae normal.  Neck:     Musculoskeletal: Normal range of motion and neck supple.     Trachea: No tracheal deviation.     Comments: Anterior neck stoma, no surrounding erythema Cardiovascular:     Rate and Rhythm: Regular rhythm. Tachycardia present.  Pulmonary:     Effort: Pulmonary effort is normal.     Breath sounds: Normal breath sounds.  Abdominal:     General: There is no distension (suprapubic cath).     Palpations: Abdomen is soft.     Tenderness: There is no abdominal tenderness. There is no guarding.  Skin:    General: Skin is warm.     Findings: No rash.  Neurological:     Mental Status: He is alert and oriented to person, place, and time.      ED Treatments / Results  Labs (all labs ordered are listed, but only abnormal results are displayed) Labs Reviewed  COMPREHENSIVE METABOLIC PANEL - Abnormal; Notable for the following components:      Result Value   Sodium 129 (*)    Chloride 90 (*)    Glucose, Bld 124 (*)    Creatinine, Ser 0.60 (*)    AST 50 (*)    ALT 63 (*)    All other components within normal limits  CBC WITH DIFFERENTIAL/PLATELET - Abnormal; Notable for the following components:   Lymphs Abs 0.5 (*)    All other components within normal limits  URINALYSIS, ROUTINE W REFLEX MICROSCOPIC - Abnormal; Notable for the following components:   APPearance CLOUDY (*)    All other  components within normal limits  LACTIC ACID, PLASMA - Abnormal; Notable for the following components:   Lactic Acid, Venous 3.3 (*)    All other components within normal limits  LACTIC ACID, PLASMA - Abnormal; Notable for the following components:   Lactic Acid, Venous 3.3 (*)    All other components within normal limits  CULTURE, BLOOD (ROUTINE X 2)  SARS CORONAVIRUS 2 (HOSPITAL ORDER, [facility])  CULTURE, BLOOD (ROUTINE X 2)  URINE CULTURE    EKG None  Radiology Dg Chest Port 1 View  Result Date: 04/14/2019 CLINICAL DATA:  Cough EXAM: PORTABLE CHEST 1 VIEW COMPARISON:  February 16, 2019 FINDINGS: There is mild atelectatic change in the right mid lung. There is no edema or consolidation. Heart size and pulmonary vascularity are normal. No adenopathy. No bone lesions. IMPRESSION: Mild right midlung atelectasis. Lungs elsewhere clear. Stable cardiac silhouette. Electronically Signed   By: Lowella Grip III M.D.   On: 04/14/2019 14:11    Procedures Procedures (including critical care time)  Medications Ordered in ED Medications  azithromycin (ZITHROMAX) 500 mg in sodium chloride 0.9 % 250 mL IVPB (has no administration in time range)  LORazepam (ATIVAN) injection 0.5 mg (has no administration in time range)  sodium chloride 0.9 % bolus 500 mL (has no administration in time range)  sodium chloride 0.9 % bolus 1,000 mL (0 mLs Intravenous Stopped 04/14/19 1529)  cefTRIAXone (ROCEPHIN) 1 g in sodium chloride 0.9 % 100 mL IVPB (1 g Intravenous New Bag/Given 04/14/19 1543)     Initial Impression / Assessment and Plan / ED Course  I have reviewed the triage vital signs and the nursing notes.  Pertinent labs & imaging results that were available during my care of the patient were reviewed by me and considered in my medical decision making (see chart  for details).       Patient with history of head neck cancer presents with cough, recurrent vomiting and  general malaise.  Patient is not actively receiving chemo or radiation.  Patient does appear clinically dehydrated and sinus tachycardia.  Plan for IV fluids, sepsis screening including lactate, blood work, cultures.  Patient's heart rate improved with fluids.  No fever on reassessment.  Chest x-ray possible infiltrate.  Urinalysis no sign of infection.  Lactic acid returned at 3.3.  Patient does have productive cough, abnormal x-ray and early sepsis still in the differential diagnosis.  IV rocephin and azithromycin ordered for possible CAP.  COVID negative.  HR improved.   Discussed with hospitalist for admission, she accepted. Patient did develop mild agitation, no focal deficits however with new confusion CT scan of the head obtained.  Nursing to update admitting doctor.  0.5 mg Ativan to help obtain CT scan.  Jveon Pound was evaluated in Emergency Department on 04/14/2019 for the symptoms described in the history of present illness. He was evaluated in the context of the global COVID-19 pandemic, which necessitated consideration that the patient might be at risk for infection with the SARS-CoV-2 virus that causes COVID-19. Institutional protocols and algorithms that pertain to the evaluation of patients at risk for COVID-19 are in a state of rapid change based on information released by regulatory bodies including the CDC and federal and state organizations. These policies and algorithms were followed during the patient's care in the ED.  Final Clinical Impressions(s) / ED Diagnoses   Final diagnoses:  Vomiting in adult  Lactic acidosis  Hyponatremia  Confusion    ED Discharge Orders    None       Elnora Morrison, MD 04/14/19 1612    Elnora Morrison, MD 04/14/19 1654

## 2019-04-14 NOTE — ED Notes (Signed)
Date and time results received: 04/14/19 4:49 PM  (use smartphrase ".now" to insert current time)  Test: lactic acid Critical Value: 3.3  Name of Provider Notified: Reather Converse, MD  Orders Received? Or Actions Taken?: None at this time will continue to monitor

## 2019-04-14 NOTE — ED Notes (Signed)
Pt is now resting comfortably.

## 2019-04-14 NOTE — Care Management Obs Status (Signed)
[facility] NOTIFICATION   Patient Details  Name: Brandon Hall MRN: [id number] Date of Birth: 04-24-1956   Medicare Observation Status Notification Given:       Jake Church, Marlinda Mike 04/14/2019, 9:51 PM

## 2019-04-14 NOTE — ED Notes (Signed)
Notified CT that patient is ready for transport

## 2019-04-14 NOTE — Progress Notes (Signed)
CSW contacted Pt's spouse, Brown Dunlap, to deliver Obs notice. Pt's wife explains that they use a family friend Kerin Salen) to assist with transportation to and from medical appointments. Pt's wife goes into detail and explains that Brayton Layman will be to [facility] on tomorrow to receive an update on Pts status. Pts wife also reports that Pt does not have or need any DME to assist in a home setting; explains that he ambulates, shower and uses the bathroom independently, unassisted.   [facility]  Clinical Social Worker  Ph: [phone]

## 2019-04-14 NOTE — H&P (Addendum)
History and Physical    Brandon Hall WUJ:[id number] DOB: [birth year] DOA: 04/14/2019  PCP: Dettinger, Elige Radon, MD   Patient coming from: Home  I have personally briefly reviewed patient's old medical records in [facility]  Chief Complaint: Jaw pain, cough  HPI: Brandon Hall is a 63 y.o. male with medical history significant for hypertension, Graves' and hypothyroidism, squamous cell carcinoma of the head and neck, blind right eye, who came into the ED with complaints of right jaw pain.  Per triage note patient was complaining of increasing right jaw pain, and that he vomited twice today.  There was also reports of cough.  At the time of my evaluation patient was agitated, awake and alert and trying to move around in the bed he would not answer questions.  Patient is nonverbal as he has a tracheostomy in his neck.  I called patient's spouse- Brandon Hall, she tells me his right jaw pain has been ongoing for at least 2 weeks with swelling.  She also reports some swelling inside his mouth, possibly involving his tongue.  He is currently on chemotherapy.  She denies cough but states intermittently he will have secretions from his tracheostomy, this was unchanged.  She denies fever or chills.  She tells me he told patient to come to the ED because today he suddenly looked like he was having difficulty breathing and this scared her.  Patient has been home with her over the past few weeks since lockdown, they have no sick contacts.  He has a home health nurse that comes and no fevere have been reported.  She reports patient has intermittent episodes of confusion and agitation, that self resolve.  Today before coming to the ED was confused but this was nothing unusual.  Spouse also denies patient is DNR.  ED Course: Tachycardic to 128, occasional tachypnea to 29, O2 sats greater than 98% on room air (recording of 82% was spurious as there was a problem with the oximeter).  Unremarkable CBC, CMP shows  chronically low sodium 129, unremarkable UA, elevated lactic acid 3.3.  EKG showed sinus tachycardia with PVC otherwise unchanged from prior.  Portable chest x-ray shows mild right midlung atelectasis.  COVID test was initiated in the ED and was negative.  IV ceftriaxone and azithromycin started in ED. Blood cultures obtained hospitalist to admit for possible pneumonia.  While in ED patient became agitated, per EDP at this was different from his mentation when he came to the ED.  So head CT was ordered  Review of Systems: As per HPI all other systems reviewed and negative.  Past Medical History:  Diagnosis Date  . Arthritis   . Blindness of right eye   . Cancer (HCC)    pharyngeal ca  . History of radiation therapy 11/2014   TxN3MO squamous cell carcinoma of L neck, Stage IVB, unknown primary 70 Gy in 35 fractions treated at [facility], finished 12/25/14  . History of shingles    RASH  02-03-2014  PER PCP RESOLVING  . Hypertension    NO MEDS ,  MONITORED BY PCP  . Hypothyroidism, postradioiodine therapy   . Prostate cancer (HCC) 07/12/2014   DX  SEVERAL  YRS   AGO  WITH RADIATION THERAPY  . Squamous cell carcinoma of head and neck (HCC) 07/12/2014  . 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 COLONOSCOPY  10/20/2011   Procedure: COLONOSCOPY;  Surgeon: Molly Maduro  Sonnie Alamo, MD;  [facility];  Service: Endoscopy;  Laterality: N/A;  1:00 pm  . COLONOSCOPY N/A 02/03/2018   Procedure: COLONOSCOPY;  Surgeon: Corbin Ade, MD;  Location[facility];  Service: Endoscopy;  Laterality: N/A;  2:15  . CRYOABLATION N/A 04/07/2014   Procedure: SALVAGE CRYO ABLATION PROSTATE;  Surgeon: Su Grand, MD;  Location: [facility];  Service: Urology;  Laterality: N/A;  . DIRECT LARYNGOSCOPY N/A 09/06/2018   Procedure: DIRECT LARYNGOSCOPY biopsy with frozen section;  Surgeon: Serena Colonel, MD;  Location: [facility] OR;   Service: ENT;  Laterality: N/A;  . IR GASTROSTOMY TUBE MOD SED  08/20/2018  . IR GENERIC HISTORICAL  03/27/2017   IR REMOVAL TUN ACCESS W/ PORT W/O FL MOD SED [facility] RAD  . LARYNGETOMY N/A 09/06/2018   Procedure: total LARYNGECTOMY;  Surgeon: Serena Colonel, MD;  Location: [facility] OR;  Service: ENT;  Laterality: N/A;  . LARYNGOSCOPY    . LYMPH NODE BIOPSY    . RADICAL NECK DISSECTION Right 09/06/2018   Procedure: Modified NECK DISSECTION;  Surgeon: Serena Colonel, MD;  Location: [facility] OR;  Service: ENT;  Laterality: Right;     reports that he quit smoking about 5 years ago. His smoking use included cigarettes. He has a 40.00 pack-year smoking history. He has never used smokeless tobacco. He reports that he does not drink alcohol or use drugs.  No Known Allergies  Family History  Problem Relation Age of Onset  . Colon cancer Father   . Hypertension Mother   . Colon cancer Brother   . Colon cancer Brother     Prior to Admission medications   Medication Sig Start Date End Date Taking? Authorizing Provider  feeding supplement (OSMOLITE 1 CAL) LIQD Place 237 mLs into feeding tube 4 (four) times daily.   Yes [provider]  hydrochlorothiazide (HYDRODIURIL) 12.5 MG tablet Take 1 tablet (12.5 mg total) by mouth daily. Patient taking differently: Place 12.5 mg into feeding tube daily.  11/11/18  Yes Dettinger, Elige Radon, MD  HYDROcodone-acetaminophen (HYCET) 7.5-325 mg/15 ml solution TAKE (2) TEASPOONFULS EVERY FOUR HOURS AS NEEDED FOR PAIN. Patient taking differently: Place 10 mLs into feeding tube every 4 (four) hours as needed for moderate pain.  04/11/19  Yes Lockamy, Randi L, NP-C  levothyroxine (SYNTHROID, LEVOTHROID) 100 MCG tablet Place 1 tablet (100 mcg total) into feeding tube daily before breakfast. 11/11/18  Yes Dettinger, Elige Radon, MD  magic mouthwash SOLN Take 5 mLs by mouth 3 (three) times daily as needed for mouth pain. 03/21/19  Yes Dettinger, Elige Radon, MD  nystatin (MYCOSTATIN)  100000 UNIT/ML suspension Take 5 mLs (500,000 Units total) by mouth 4 (four) times daily. 03/16/19  Yes Dettinger, Elige Radon, MD  Pembrolizumab [facility] IV) Inject 200 mg into the vein every 21 ( twenty-one) days.    Yes [provider]  polyethylene glycol (MIRALAX / GLYCOLAX) packet Take 17 g by mouth daily as needed for mild constipation or moderate constipation.    Yes [provider]  pravastatin (PRAVACHOL) 40 MG tablet Take 1 tablet (40 mg total) by mouth daily. Patient taking differently: Place 40 mg into feeding tube daily.  11/11/18  Yes Dettinger, Elige Radon, MD  prochlorperazine (COMPAZINE) 10 MG tablet Take 1 tablet (10 mg total) by mouth every 6 (six) hours as needed (Nausea or vomiting). 02/07/19  Yes Doreatha Massed, MD  Vitamin D, Ergocalciferol, (DRISDOL) 1.25 MG (50000 UT) CAPS capsule TAKE 1 CAPSULE EVERY 7  DAYS. Patient taking differently: Place 50,000 Units into feeding tube every 7 (seven) days.  02/23/19  Yes Dettinger, Elige Radon, MD    Physical Exam: Vitals:   04/14/19 1452 04/14/19 1630 04/14/19 1705 04/14/19 1738  BP:  (!) 162/103 (!) 158/112   Pulse: (!) 106 (!) 128 (!) 37 (!) 122  Resp: 19  (!) 27   Temp:      TempSrc:      SpO2: 99% 95% (!) 82% 100%  Weight:      Height:        Constitutional: Agitated Vitals:   04/14/19 1452 04/14/19 1630 04/14/19 1705 04/14/19 1738  BP:  (!) 162/103 (!) 158/112   Pulse: (!) 106 (!) 128 (!) 37 (!) 122  Resp: 19  (!) 27   Temp:      TempSrc:      SpO2: 99% 95% (!) 82% 100%  Weight:      Height:       Eyes: Blind right eye, , lids and conjunctivae normal ENMT: Mucous membranes are dry.  Whitish coating seen on tongue.  Face appears asymmetric with right mandible appearing larger than left jaw, swelling also involving the right side of the buccal cavity.  No tenderness on examination of the jaw, no fluctuance. Neck: normal, supple, no masses, no thyromegaly.  Tracheostomy present.  Without trach  collar. Respiratory: Normal respiratory effort. No accessory muscle use.  Cardiovascular: Tachycardic, regular rate and rhythm,No extremity edema. 2+ pedal pulses. Abdomen: PEG tube mid abdomen near umbilicus. No tenderness, no masses palpated. No hepatosplenomegaly.   Musculoskeletal: no clubbing / cyanosis. No joint deformity upper and lower extremities. Good ROM, no contractures..  Skin: no rashes, lesions, ulcers. No induration Neurologic: Moving all extremities spontaneously.  Unable to complete further neuro exam Psychiatric: Awake and Alert.  Agitated.  Keeps pointing towards part of the room, attempting to get up from the bed.  Labs on Admission: I have personally reviewed following labs and imaging studies  CBC: Recent Labs  Lab 04/14/19 1330  WBC 5.9  NEUTROABS 4.8  HGB 14.3  HCT 44.4  MCV 81.2  PLT 298   Basic Metabolic Panel: Recent Labs  Lab 04/14/19 1330  NA 129*  K 4.3  CL 90*  CO2 27  GLUCOSE 124*  BUN 13  CREATININE 0.60*  CALCIUM 9.6   Liver Function Tests: Recent Labs  Lab 04/14/19 1330  AST 50*  ALT 63*  ALKPHOS 103  BILITOT 0.8  PROT 8.1  ALBUMIN 3.9   Urine analysis:    Component Value Date/Time   COLORURINE YELLOW 04/14/2019 1330   APPEARANCEUR CLOUDY (A) 04/14/2019 1330   APPEARANCEUR Cloudy (A) 05/12/2018 1347   LABSPEC 1.010 04/14/2019 1330   PHURINE 8.0 04/14/2019 1330   GLUCOSEU NEGATIVE 04/14/2019 1330   HGBUR NEGATIVE 04/14/2019 1330   BILIRUBINUR NEGATIVE 04/14/2019 1330   BILIRUBINUR Negative 05/12/2018 1347   KETONESUR NEGATIVE 04/14/2019 1330   PROTEINUR NEGATIVE 04/14/2019 1330   NITRITE NEGATIVE 04/14/2019 1330   LEUKOCYTESUR NEGATIVE 04/14/2019 1330    Radiological Exams on Admission: Dg Chest Port 1 View  Result Date: 04/14/2019 CLINICAL DATA:  Cough EXAM: PORTABLE CHEST 1 VIEW COMPARISON:  February 16, 2019 FINDINGS: There is mild atelectatic change in the right mid lung. There is no edema or consolidation. Heart  size and pulmonary vascularity are normal. No adenopathy. No bone lesions. IMPRESSION: Mild right midlung atelectasis. Lungs elsewhere clear. Stable cardiac silhouette. Electronically Signed   By: Bretta Bang  III M.D.   On: 04/14/2019 14:11    EKG: Independently reviewed.  Sinus tachycardia, QTC 481.  PVC.  EKG unchanged from prior.  Assessment/Plan Active Problems:   Tachycardia   Right jaw pain- increasing pain and swelling likely related to progression of head and neck cancer - PET scan 01/26/2019 shows bulky intensely hypermetabolic tissue in posterior right oropharynx consistent with squamous cell carcinoma recurrence. -CT maxillofacial- had to be deferred as patient would require 2 separate injections of contrast. Chest CT takes precedence. Or if ongoing concerns consider Ct Wo contrast. -Continue home PRN hydrocodone  Lactic acidosis- 3.3, 1L normal saline bolus given.  Tachycardic to 128, otherwise not meeting SIRS criteria.  UA unremarkable, port chest x-ray-mild right midlung atelectasis otherwise chest is clear.  COVID test negative.  IV ceftriaxone and azithromycin given in the ED for possible pneumonia.  Lactic acidosis likely from dehydration.  Reports of vomiting. - F/u blood and urine cultures -Follow-up CTA chest- neg for PE -Repeat 1 L bolus, IV fluid normal saline 100cc/hr x 15hrs -Trend lactic acid  Dyspnea-in the ED this is not evident, O2 sats greater than 98% on room air. He is tachycardic. He is high risk for thromboembolic disease. At the time of my evaluation he is unable to answer subjectively if he feels short of breath or has chest pain.  Chest x-ray shows mild atelectasis. -CTA chest  Encephalopathy-apparently this is not new.  Agitation in the ED. response to 0.5 Ativan given in ED -Follow-up head CT -1mg  Ativan x 1, then PRN Ativan  Recurrent squamous cell carcinoma of the head and neck- f/w Dr. Ellin Saba. Stage 4 carcinoma of neck, treated with  chemotherapy, s/p total laryngectomy and right neck dissection 2019.  Poorly differentiated.  Per oncologist note- 3/27- Recently placed on hospice by his pulmonologist.  Currently on chemotherapy.  Hypothyroidism -Continue home Synthroid  DVT prophylaxis: Lovenox Code Status: FULL (per problem list patient is DNR but spouse denies this) Family Communication: Spoke extensively to patient's wife on the phone. Disposition Plan: Per rounding team Consults called:  Admission status: Observation, telemetry   Marilyne Haseley Wendall Stade MD [facility]  04/14/2019, 6:47 PM

## 2019-04-14 NOTE — ED Notes (Signed)
Patient is tolerating oral fluids at this time.

## 2019-04-14 NOTE — ED Notes (Addendum)
Patient exhibiting severe agitation. Patient will not stop trying to rip his leads off, IV out, and is trying to get out of the bed. Patient was able to urinate in urinal. This did not seem to relieve agitation. Patient O2 sat has remained consistently 100% room air. No cough present. Patient has had no episodes of emesis while in ED. Gave patient Ativan and requested a sitter order. Patient not redirectable at this time.

## 2019-04-14 NOTE — ED Notes (Signed)
Date and time results received: 04/14/19 3:25 PM  (use smartphrase ".now" to insert current time)  Test: lactic acid Critical Value: 3.3  Name of Provider Notified: Reather Converse, MD  Orders Received? Or Actions Taken?: None at this time. Will continue to monitor.

## 2019-04-15 ENCOUNTER — Ambulatory Visit (HOSPITAL_COMMUNITY): Payer: Medicare HMO

## 2019-04-15 ENCOUNTER — Other Ambulatory Visit (HOSPITAL_COMMUNITY): Payer: Medicare HMO

## 2019-04-15 ENCOUNTER — Ambulatory Visit (HOSPITAL_COMMUNITY): Payer: Medicare HMO | Admitting: Hematology

## 2019-04-15 ENCOUNTER — Encounter (HOSPITAL_COMMUNITY): Payer: Medicare HMO

## 2019-04-15 DIAGNOSIS — R1110 Vomiting, unspecified: Secondary | ICD-10-CM

## 2019-04-15 DIAGNOSIS — J690 Pneumonitis due to inhalation of food and vomit: Secondary | ICD-10-CM | POA: Diagnosis not present

## 2019-04-15 DIAGNOSIS — E871 Hypo-osmolality and hyponatremia: Secondary | ICD-10-CM | POA: Diagnosis not present

## 2019-04-15 DIAGNOSIS — E872 Acidosis, unspecified: Secondary | ICD-10-CM

## 2019-04-15 DIAGNOSIS — E860 Dehydration: Secondary | ICD-10-CM

## 2019-04-15 DIAGNOSIS — E43 Unspecified severe protein-calorie malnutrition: Secondary | ICD-10-CM

## 2019-04-15 DIAGNOSIS — R000 Tachycardia, unspecified: Secondary | ICD-10-CM | POA: Diagnosis not present

## 2019-04-15 MED ORDER — CHLORHEXIDINE GLUCONATE 0.12 % MT SOLN
15.0000 mL | Freq: Two times a day (BID) | OROMUCOSAL | Status: DC
  Administered 2019-04-15 (×2): 15 mL via OROMUCOSAL
  Filled 2019-04-15 (×2): qty 15

## 2019-04-15 MED ORDER — ORAL CARE MOUTH RINSE
15.0000 mL | Freq: Two times a day (BID) | OROMUCOSAL | Status: DC
  Administered 2019-04-15: 13:00:00 15 mL via OROMUCOSAL

## 2019-04-15 MED ORDER — AMOXICILLIN-POT CLAVULANATE 250-62.5 MG/5ML PO SUSR: 250.0000 mg | Freq: Three times a day (TID) | ORAL | 0 refills | Status: DC

## 2019-04-15 MED ORDER — FREE WATER: 300.0000 mL | Freq: Three times a day (TID) | Status: DC

## 2019-04-15 NOTE — Progress Notes (Signed)
Repositions self frequently. Mittens applied to prevent pulling at telemetry, IV, PEG tube. Will not keep continuous pulse ox on. Pink. No resp distress.

## 2019-04-15 NOTE — Discharge Summary (Signed)
Physician Discharge Summary  Brandon Hall WUJ:[id number] DOB: Jul 11, 1956 DOA: 04/14/2019  PCP: Dettinger, Elige Radon, MD  Admit date: 04/14/2019 Discharge date: 04/15/2019  Time spent: 35 minutes  Recommendations for Outpatient Follow-up:  1. Repeat chest x-ray in about 4 weeks to assure complete resolution of infiltrates 2. Reassess blood pressure and determine the need of antihypertensive medications 3. Outpatient follow-up with oncology service; recommending goals of care discussion given ongoing recurrent squamous cell carcinoma with overall poor prognosis.   Discharge Diagnoses:  Active Problems:   Severe protein-calorie malnutrition (HCC)   Tachycardia   Hyponatremia   Lactic acidosis   Vomiting in adult   Dehydration   Aspiration pneumonia of right middle lobe due to vomit [facility])   Discharge Condition: Stable and with good oxygen saturation on room air.  Patient discharged home with antibiotics to complete treatment for pneumonia.  Follow-up with PCP in 1 week.  Diet recommendation: Continue feeding tube and free water as instructed.  Filed Weights   04/14/19 1303 04/14/19 2145  Weight: 54 kg 52.4 kg    History of present illness:  As per H&P written by Dr. Mariea Clonts on 04/14/2019 63 y.o. male with medical history significant for hypertension, Graves' and hypothyroidism, squamous cell carcinoma of the head and neck, blind right eye, who came into the ED with complaints of right jaw pain.  Per triage note patient was complaining of increasing right jaw pain, and that he vomited twice today.  There was also reports of cough.  At the time of my evaluation patient was agitated, awake and alert and trying to move around in the bed he would not answer questions.  Patient is nonverbal as he has a tracheostomy in his neck.  I called patient's spouse- Keaten Lira, she tells me his right jaw pain has been ongoing for at least 2 weeks with swelling.  She also reports some swelling inside his  mouth, possibly involving his tongue.  He is currently on chemotherapy.  She denies cough but states intermittently he will have secretions from his tracheostomy, this was unchanged.  She denies fever or chills.  She tells me he told patient to come to the ED because today he suddenly looked like he was having difficulty breathing and this scared her.  Patient has been home with her over the past few weeks since lockdown, they have no sick contacts.  He has a home health nurse that comes and no fevere have been reported.  She reports patient has intermittent episodes of confusion and agitation, that self resolve.  Today before coming to the ED was confused but this was nothing unusual.   Hospital Course:  1-aspiration pneumonia -Patient with x-ray changes suggesting right lower lobe atelectasis/infiltrate; in the setting of most likely aspiration following episodes of vomiting. -Good oxygen saturation on room air -Patient was afebrile -Will discharge on Augmentin suspension to complete antibiotic therapy -Discussed with family members importance of upright position while providing feeding tubes/medications.  2-requiring a squamous cell carcinoma of the head and neck -Continue outpatient follow-up with Dr. Ellin Saba -Patient with a stage IV carcinoma of the neck, acutely receiving chemotherapy therapy; status post total laryngectomy and right neck dissection in 2019. -Overall poor prognosis. -will recommend GOC discussion as an outpatient.  3-hypothyroidism -Continue Synthroid  4-severe protein calorie malnutrition -Continue feeding tubes -Body mass index is 15.24 kg/m.  5-lactic acidosis/dehydration/hyponatremia -Normal saline boluses and fluid resuscitation given volume patient -We will recommend at least 300 mL of free water to be  given on daily basis -HCTZ has been discontinued. -Na 129-130 at discharge  6-intermittent episode of encephalopathy -Admitting doctor has discussed with  patient's wife who reported intermittent episode of altered mentation; mainly around the use of pain medications -CT head demonstrated no acute intracranial abnormalities -Patient mentation improved and essentially at baseline at time of discharge.  7-essential hypertension -Blood pressure remains soft but stable -No need of antihypertensive agents at this time -HCTZ discontinued and at discharge.  8-Hyperlipidemia -Continue Pravachol.  Procedures:  See below for x-ray reports.  Consultations:  None  Discharge Exam: Vitals:   04/14/19 2145 04/15/19 0528  BP: 114/69 106/65  Pulse: 72 (!) 109  Resp: 16 17  Temp: 98 F (36.7 C) 98 F (36.7 C)  SpO2: 97% 97%    General: Afebrile, in no acute distress.  Nonverbal at baseline but able to follow commands and by nodding his head answering questions appropriately.  Chronically ill in appearance, with positive right-sided facial swelling and cachexia. Cardiovascular: S1 and S2, no rubs, no gallops.  Rate controlled during examination.  No JVD appreciated. Respiratory: Positive scattered rhonchi (right more than left), no wheezing, no crackles, normal respiratory effort. Abdomen: Soft, nontender, PEG tube in place, positive bowel sounds. Extremities: No cyanosis, no clubbing, no edema.  Discharge Instructions   Discharge Instructions    Discharge instructions   Complete by:  As directed    Please make sure to provide adequate amount of free water through PEG tube to achieve proper hydration Take medications as prescribed Complete 6 more days of antibiotics as instructed Outpatient follow-up with oncology service as previously scheduled Arrange follow-up with PCP in 1 week     Allergies as of 04/15/2019   No Known Allergies     Medication List    STOP taking these medications   hydrochlorothiazide 12.5 MG tablet Commonly known as:  HYDRODIURIL     TAKE these medications   amoxicillin-clavulanate 250-62.5 MG/5ML  suspension Commonly known as:  AUGMENTIN Take 5 mLs (250 mg total) by mouth 3 (three) times daily for 6 days.   feeding supplement Liqd Place 237 mLs into feeding tube 4 (four) times daily.   free water Soln Place 300 mLs into feeding tube every 8 (eight) hours.   HYDROcodone-acetaminophen 7.5-325 mg/15 ml solution Commonly known as:  HYCET TAKE (2) TEASPOONFULS EVERY FOUR HOURS AS NEEDED FOR PAIN. What changed:  See the new instructions.   KEYTRUDA IV Inject 200 mg into the vein every 21 ( twenty-one) days.   levothyroxine 100 MCG tablet Commonly known as:  SYNTHROID Place 1 tablet (100 mcg total) into feeding tube daily before breakfast.   magic mouthwash Soln Take 5 mLs by mouth 3 (three) times daily as needed for mouth pain.   nystatin 100000 UNIT/ML suspension Commonly known as:  MYCOSTATIN Take 5 mLs (500,000 Units total) by mouth 4 (four) times daily.   polyethylene glycol 17 g packet Commonly known as:  MIRALAX / GLYCOLAX Take 17 g by mouth daily as needed for mild constipation or moderate constipation.   pravastatin 40 MG tablet Commonly known as:  PRAVACHOL Take 1 tablet (40 mg total) by mouth daily. What changed:  how to take this   prochlorperazine 10 MG tablet Commonly known as:  COMPAZINE Take 1 tablet (10 mg total) by mouth every 6 (six) hours as needed (Nausea or vomiting).   Vitamin D (Ergocalciferol) 1.25 MG (50000 UT) Caps capsule Commonly known as:  DRISDOL TAKE 1 CAPSULE EVERY 7 DAYS.  What changed:  See the new instructions.      No Known Allergies Follow-up Information    [facility] Follow up on 04/20/2019.   Specialty:  Family Medicine Why:  Wednesday at 9:55 for your hospial follow up appointment.  Please note this is in place of your previoously scheduled appoointment on 5/14. Contact information: [address] [place] [address] [phone]          The results of significant  diagnostics from this hospitalization (including imaging, microbiology, ancillary and laboratory) are listed below for reference.    Significant Diagnostic Studies: Ct Head Wo Contrast  Result Date: 04/14/2019 CLINICAL DATA:  63 y/o M; Altered level of consciousness (LOC), unexplained. EXAM: CT HEAD WITHOUT CONTRAST TECHNIQUE: Contiguous axial images were obtained from the base of the skull through the vertex without intravenous contrast. COMPARISON:  03/27/2019 CT head. FINDINGS: Brain: No evidence of acute infarction, hemorrhage, hydrocephalus, extra-axial collection or mass effect. Stable nonspecific white matter hypodensities compatible with mild chronic microvascular ischemic changes and stable volume loss of the brain. Stable levin mm calcified lesion within the right inferior cerebellar hemisphere. Vascular: Calcific atherosclerosis of the carotid siphons. No hyperdense vessel identified. Skull: Normal. Negative for fracture or focal lesion. Sinuses/Orbits: Diffuse paranasal sinus mucosal thickening, right maxillary sinus fluid level, and debris within the nasopharynx, similar to prior CT head. Normal aeration of mastoid air cells. Hyperdense right globe. Normal left orbit. Other: None. IMPRESSION: 1. No acute intracranial abnormality identified. 2. Stable mild chronic microvascular ischemic changes and parenchymal volume loss of the brain. 3. Stable right inferior cerebellar hemisphere calcified lesion. Electronically Signed   By: Mitzi Hansen M.D.   On: 04/14/2019 20:15   Ct Head Wo Contrast  Result Date: 03/27/2019 CLINICAL DATA:  Questionable seizure. Abnormal neurologic exam. Nontraumatic. EXAM: CT HEAD WITHOUT CONTRAST TECHNIQUE: Contiguous axial images were obtained from the base of the skull through the vertex without intravenous contrast. COMPARISON:  02/16/2019 FINDINGS: Brain: No evidence of acute infarction, hemorrhage, hydrocephalus, extra-axial collection or mass lesion/mass  effect. Prominence of the sulci and ventricles compatible with brain atrophy. Unchanged appearance of 1.2 cm calcified lesion in the anterior inferior right cerebellum. Vascular: No hyperdense vessel or unexpected calcification. Skull: Normal. Negative for fracture or focal lesion. Sinuses/Orbits: Bilateral maxillary sinus mucosal thickening identified. Air-fluid level within the right maxillary sinus also noted. Mastoid air cells are clear. Other: None. IMPRESSION: 1. No acute intracranial abnormality. 2. Unchanged 1.3 cm calcified lesion in the right cerebellum. 3. Chronic brain atrophy. Electronically Signed   By: Signa Kell M.D.   On: 03/27/2019 20:01   Ct Angio Chest Pe W Or Wo Contrast  Result Date: 04/14/2019 CLINICAL DATA:  Increasing jaw pain, shortness of breath and cough, initial encounter EXAM: CT ANGIOGRAPHY CHEST WITH CONTRAST TECHNIQUE: Multidetector CT imaging of the chest was performed using the standard protocol during bolus administration of intravenous contrast. Multiplanar CT image reconstructions and MIPs were obtained to evaluate the vascular anatomy. CONTRAST:  OMNIPAQUE 350 COMPARISON:  04/14/2019, 01/26/2019 PET-CT FINDINGS: Cardiovascular: Thoracic aorta demonstrates atherosclerotic calcifications without aneurysmal dilatation. No cardiac enlargement is seen. Coronary calcifications are noted. The pulmonary artery shows a normal branching pattern. No filling defects to suggest pulmonary embolism are identified. Mediastinum/Nodes: Thoracic inlet demonstrates evidence of prior tracheostomy as well as some left cervical adenopathy similar to that seen on prior PET-CT. No hilar or mediastinal adenopathy is noted. The esophagus is within normal limits. Lungs/Pleura: Lungs demonstrate diffuse  emphysematous changes. No focal mass lesion or sizable effusion is noted. No focal infiltrate is seen. Scarring is noted in the apices bilaterally. Upper Abdomen: Visualized upper abdomen  shows no acute abnormality. Musculoskeletal: Degenerative changes of the cervical and thoracic spine are seen. Review of the MIP images confirms the above findings. IMPRESSION: No evidence of pulmonary emboli. Changes of prior tracheostomy. Left cervical lymphadenopathy stable from prior PET-CT Aortic Atherosclerosis (ICD10-I70.0) and Emphysema (ICD10-J43.9). Electronically Signed   By: Alcide Clever M.D.   On: 04/14/2019 22:17   Dg Chest Port 1 View  Result Date: 04/14/2019 CLINICAL DATA:  Cough EXAM: PORTABLE CHEST 1 VIEW COMPARISON:  February 16, 2019 FINDINGS: There is mild atelectatic change in the right mid lung. There is no edema or consolidation. Heart size and pulmonary vascularity are normal. No adenopathy. No bone lesions. IMPRESSION: Mild right midlung atelectasis. Lungs elsewhere clear. Stable cardiac silhouette. Electronically Signed   By: Bretta Bang III M.D.   On: 04/14/2019 14:11    Microbiology: Recent Results (from the past 240 hour(s))  Blood Culture (routine x 2)     Status: None (Preliminary result)   Collection Time: 04/14/19  1:30 PM  Result Value Ref Range Status   Specimen Description BLOOD RIGHT HAND  Final   Special Requests   Final    BOTTLES DRAWN AEROBIC AND ANAEROBIC Blood Culture adequate volume   Culture   Final    NO GROWTH < 24 HOURS Performed at [facility], [address], [place], [place] [address]    Report Status PENDING  Incomplete  SARS Coronavirus 2 [facility] order, Performed in [facility] hospital lab)     Status: None   Collection Time: 04/14/19  1:30 PM  Result Value Ref Range Status   SARS Coronavirus 2 NEGATIVE NEGATIVE Final    Comment: (NOTE) If result is NEGATIVE SARS-CoV-2 target nucleic acids are NOT DETECTED. The SARS-CoV-2 RNA is generally detectable in upper and lower  respiratory specimens during the acute phase of infection. The lowest  concentration of SARS-CoV-2 viral copies this assay can detect is 250  copies / mL. A  negative result does not preclude SARS-CoV-2 infection  and should not be used as the sole basis for treatment or other  patient management decisions.  A negative result may occur with  improper specimen collection / handling, submission of specimen other  than nasopharyngeal swab, presence of viral mutation(s) within the  areas targeted by this assay, and inadequate number of viral copies  (<250 copies / mL). A negative result must be combined with clinical  observations, patient history, and epidemiological information. If result is POSITIVE SARS-CoV-2 target nucleic acids are DETECTED. The SARS-CoV-2 RNA is generally detectable in upper and lower  respiratory specimens dur ing the acute phase of infection.  Positive  results are indicative of active infection with SARS-CoV-2.  Clinical  correlation with patient history and other diagnostic information is  necessary to determine patient infection status.  Positive results do  not rule out bacterial infection or co-infection with other viruses. If result is PRESUMPTIVE POSTIVE SARS-CoV-2 nucleic acids MAY BE PRESENT.   A presumptive positive result was obtained on the submitted specimen  and confirmed on repeat testing.  While 2019 novel coronavirus  (SARS-CoV-2) nucleic acids may be present in the submitted sample  additional confirmatory testing may be necessary for epidemiological  and / or clinical management purposes  to differentiate between  SARS-CoV-2 and other Sarbecovirus currently known to infect humans.  If clinically indicated additional testing with an alternate test  methodology [phone]) is advised. The SARS-CoV-2 RNA is generally  detectable in upper and lower respiratory sp ecimens during the acute  phase of infection. The expected result is Negative. Fact Sheet for Patients:  BoilerBrush.com.cy Fact Sheet for Healthcare Providers: https://pope.com/ This test is not  yet approved or cleared by the [place] FDA and has been authorized for detection and/or diagnosis of SARS-CoV-2 by FDA under an Emergency Use Authorization (EUA).  This EUA will remain in effect (meaning this test can be used) for the duration of the COVID-19 declaration under Section 564(b)(1) of the Act, 21 U.S.C. section 360bbb-3(b)(1), unless the authorization is terminated or revoked sooner. Performed at [facility], [address], [place], [place] [address]   Blood Culture (routine x 2)     Status: None (Preliminary result)   Collection Time: 04/14/19  3:49 PM  Result Value Ref Range Status   Specimen Description BLOOD LEFT HAND  Final   Special Requests   Final    BOTTLES DRAWN AEROBIC AND ANAEROBIC Blood Culture results may not be optimal due to an inadequate volume of blood received in culture bottles   Culture   Final    NO GROWTH < 24 HOURS Performed at [facility], [address] [place], [place] [address]    Report Status PENDING  Incomplete     Labs: Basic Metabolic Panel: Recent Labs  Lab 04/14/19 1330  NA 129*  K 4.3  CL 90*  CO2 27  GLUCOSE 124*  BUN 13  CREATININE 0.60*  CALCIUM 9.6   Liver Function Tests: Recent Labs  Lab 04/14/19 1330  AST 50*  ALT 63*  ALKPHOS 103  BILITOT 0.8  PROT 8.1  ALBUMIN 3.9   CBC: Recent Labs  Lab 04/14/19 1330  WBC 5.9  NEUTROABS 4.8  HGB 14.3  HCT 44.4  MCV 81.2  PLT 298   Signed:  Vassie Loll MD.  [facility] 04/15/2019, 2:08 PM

## 2019-04-15 NOTE — Progress Notes (Signed)
Bed bugs noted x1. EVS confirmed.

## 2019-04-15 NOTE — Progress Notes (Signed)
Discharged instructions and prescription reviewed with patient and wife Stanton Kidney via telephone, PIV removed, Peg tube flushed and clamped, pm care provided, patient given hospital pants and gown to wear home,  1 person assist to wheelchair, all personal belongings sealed and double bagged, await ride from [facility] ( relative/cousin) to transport home.

## 2019-04-16 LAB — URINE CULTURE

## 2019-04-19 LAB — CULTURE, BLOOD (ROUTINE X 2)
Culture: NO GROWTH
Culture: NO GROWTH
Special Requests: ADEQUATE

## 2019-04-20 ENCOUNTER — Other Ambulatory Visit: Payer: Self-pay

## 2019-04-20 ENCOUNTER — Encounter: Payer: Self-pay | Admitting: Family Medicine

## 2019-04-20 ENCOUNTER — Ambulatory Visit (INDEPENDENT_AMBULATORY_CARE_PROVIDER_SITE_OTHER): Payer: 59 | Admitting: Family Medicine

## 2019-04-20 VITALS — BP 120/79 | HR 102 | Temp 98.0°F | Ht 73.0 in | Wt 109.8 lb

## 2019-04-20 DIAGNOSIS — Z9181 History of falling: Secondary | ICD-10-CM | POA: Diagnosis not present

## 2019-04-20 DIAGNOSIS — C760 Malignant neoplasm of head, face and neck: Secondary | ICD-10-CM

## 2019-04-20 DIAGNOSIS — C140 Malignant neoplasm of pharynx, unspecified: Secondary | ICD-10-CM | POA: Diagnosis not present

## 2019-04-20 DIAGNOSIS — C4442 Squamous cell carcinoma of skin of scalp and neck: Secondary | ICD-10-CM | POA: Diagnosis not present

## 2019-04-20 DIAGNOSIS — E860 Dehydration: Secondary | ICD-10-CM | POA: Diagnosis not present

## 2019-04-20 DIAGNOSIS — C329 Malignant neoplasm of larynx, unspecified: Secondary | ICD-10-CM | POA: Diagnosis not present

## 2019-04-20 DIAGNOSIS — Z431 Encounter for attention to gastrostomy: Secondary | ICD-10-CM | POA: Diagnosis not present

## 2019-04-20 DIAGNOSIS — E871 Hypo-osmolality and hyponatremia: Secondary | ICD-10-CM

## 2019-04-20 DIAGNOSIS — J690 Pneumonitis due to inhalation of food and vomit: Secondary | ICD-10-CM | POA: Diagnosis not present

## 2019-04-20 DIAGNOSIS — Z79891 Long term (current) use of opiate analgesic: Secondary | ICD-10-CM | POA: Diagnosis not present

## 2019-04-20 DIAGNOSIS — Z87891 Personal history of nicotine dependence: Secondary | ICD-10-CM | POA: Diagnosis not present

## 2019-04-20 DIAGNOSIS — Z8546 Personal history of malignant neoplasm of prostate: Secondary | ICD-10-CM | POA: Diagnosis not present

## 2019-04-20 LAB — CMP14+EGFR
ALT: 44 IU/L (ref 0–44)
AST: 33 IU/L (ref 0–40)
Albumin/Globulin Ratio: 1.4 (ref 1.2–2.2)
Albumin: 4.4 g/dL (ref 3.8–4.8)
Alkaline Phosphatase: 110 IU/L (ref 39–117)
BUN/Creatinine Ratio: 18 (ref 10–24)
BUN: 15 mg/dL (ref 8–27)
Bilirubin Total: 0.5 mg/dL (ref 0.0–1.2)
CO2: 25 mmol/L (ref 20–29)
Calcium: 10.4 mg/dL — ABNORMAL HIGH (ref 8.6–10.2)
Chloride: 90 mmol/L — ABNORMAL LOW (ref 96–106)
Creatinine, Ser: 0.83 mg/dL (ref 0.76–1.27)
GFR calc Af Amer: 107 mL/min/{1.73_m2} (ref >59)
GFR calc non Af Amer: 93 mL/min/{1.73_m2} (ref >59)
Globulin, Total: 3.1 g/dL (ref 1.5–4.5)
Glucose: 83 mg/dL (ref 65–99)
Potassium: 4 mmol/L (ref 3.5–5.2)
Sodium: 132 mmol/L — ABNORMAL LOW (ref 134–144)
Total Protein: 7.5 g/dL (ref 6.0–8.5)

## 2019-04-20 LAB — CBC WITH DIFFERENTIAL/PLATELET
Basophils Absolute: 0 10*3/uL (ref 0.0–0.2)
Basos: 1 %
EOS (ABSOLUTE): 0 10*3/uL (ref 0.0–0.4)
Eos: 0 %
Hematocrit: 42.8 % (ref 37.5–51.0)
Hemoglobin: 13.9 g/dL (ref 13.0–17.7)
Immature Grans (Abs): 0 10*3/uL (ref 0.0–0.1)
Immature Granulocytes: 0 %
Lymphocytes Absolute: 0.9 10*3/uL (ref 0.7–3.1)
Lymphs: 19 %
MCH: 25.6 pg — ABNORMAL LOW (ref 26.6–33.0)
MCHC: 32.5 g/dL (ref 31.5–35.7)
MCV: 79 fL (ref 79–97)
Monocytes Absolute: 0.5 10*3/uL (ref 0.1–0.9)
Monocytes: 11 %
Neutrophils Absolute: 3.4 10*3/uL (ref 1.4–7.0)
Neutrophils: 69 %
Platelets: 359 10*3/uL (ref 150–450)
RBC: 5.44 x10E6/uL (ref 4.14–5.80)
RDW: 14.2 % (ref 11.6–15.4)
WBC: 4.9 10*3/uL (ref 3.4–10.8)

## 2019-04-20 NOTE — Progress Notes (Signed)
 BP 120/79   Pulse (!) 102   Temp 98 F (36.7 C) (Oral)   Ht 6' 1" (1.854 m)   Wt 109 lb 12.8 oz (49.8 kg)   BMI 14.49 kg/m    Subjective:   Patient ID: Brandon Hall, male    DOB: 07/09/1956, 63 y.o.   MRN: [id number]  HPI: Brandon Hall is a 63 y.o. male presenting on 04/20/2019 for Hospitalization Follow-up (AP 4/16)   HPI Patient is coming in for hospital follow-up.  He was admitted on 04/14/2019 and discharged on 04/15/2019.  During the hospitalization he was found to be malnourished and hyponatremic and dehydrated and had an aspiration pneumonia from vomiting and nausea.  He is currently under treatment for chemotherapy for head and neck cancer and does not appear to be improving based on imaging on the chemo and having a lot of side effects from the chemo.  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  Patient says since leaving the hospital he is not really having any cough or shortness of breath.  His niece is here with him who is providing a lot of the history because patient is nonverbal because of head and neck cancer.  He can answer with nods and hand gestures somewhat.  He has been having increasing swelling in the right side of his jaw and increasing difficulty with swallowing and has trouble with most things besides liquids at this point.  He does have pain medication and has just taken it right before coming been using it as needed but the pain right today is not controlled.  Relevant past medical, surgical, family and social history reviewed and updated as indicated. Interim medical history since our last visit reviewed. Allergies and medications reviewed and updated.  Review of Systems  Constitutional: Negative for chills and fever.  HENT: Positive for facial swelling and trouble swallowing.   Respiratory: Negative for cough,  shortness of breath and wheezing.   Cardiovascular: Negative for chest pain and leg swelling.  Gastrointestinal: Negative for abdominal pain, diarrhea, nausea and vomiting.  Musculoskeletal: Negative for back pain and gait problem.  Skin: Negative for rash.  Neurological: Negative for dizziness, light-headedness and headaches.  All other systems reviewed and are negative.   Per HPI unless specifically indicated above   Allergies as of 04/20/2019   No Known Allergies     Medication List       Accurate as of April 20, 2019 10:22 AM. Always use your most recent med list.        amoxicillin-clavulanate 250-62.5 MG/5ML suspension Commonly known as:  AUGMENTIN Take 5 mLs (250 mg total) by mouth 3 (three) times daily for 6 days.   feeding supplement Liqd Place 237 mLs into feeding tube 4 (four) times daily.   free water Soln Place 300 mLs into feeding tube every 8 (eight) hours.   HYDROcodone-acetaminophen 7.5-325 mg/15 ml solution Commonly known as:  HYCET TAKE (2) TEASPOONFULS EVERY FOUR HOURS AS NEEDED FOR PAIN.   KEYTRUDA IV Inject 200 mg into the vein every 21 ( twenty-one) days.   levothyroxine 100 MCG tablet Commonly known as:  SYNTHROID Place 1 tablet (100 mcg total) into feeding tube daily before breakfast.   magic mouthwash Soln Take 5 mLs by mouth 3 (three) times daily as needed for mouth pain.   nystatin 100000   BP 120/79   Pulse (!) 102   Temp 98 F (36.7 C) (Oral)   Ht 6' 1" (1.854 m)   Wt 109 lb 12.8 oz (49.8 kg)   BMI 14.49 kg/m    Subjective:   Patient ID: Brandon Hall, male    DOB: 23-Jan-1956, 63 y.o.   MRN: [id number]  HPI: Brandon Hall is a 63 y.o. male presenting on 04/20/2019 for Hospitalization Follow-up (AP 4/16)   HPI Patient is coming in for hospital follow-up.  He was admitted on 04/14/2019 and discharged on 04/15/2019.  During the hospitalization he was found to be malnourished and hyponatremic and dehydrated and had an aspiration pneumonia from vomiting and nausea.  He is currently under treatment for chemotherapy for head and neck cancer and does not appear to be improving based on imaging on the chemo and having a lot of side effects from the chemo.  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  Patient says since leaving the hospital he is not really having any cough or shortness of breath.  His niece is here with him who is providing a lot of the history because patient is nonverbal because of head and neck cancer.  He can answer with nods and hand gestures somewhat.  He has been having increasing swelling in the right side of his jaw and increasing difficulty with swallowing and has trouble with most things besides liquids at this point.  He does have pain medication and has just taken it right before coming been using it as needed but the pain right today is not controlled.  Relevant past medical, surgical, family and social history reviewed and updated as indicated. Interim medical history since our last visit reviewed. Allergies and medications reviewed and updated.  Review of Systems  Constitutional: Negative for chills and fever.  HENT: Positive for facial swelling and trouble swallowing.   Respiratory: Negative for cough,  shortness of breath and wheezing.   Cardiovascular: Negative for chest pain and leg swelling.  Gastrointestinal: Negative for abdominal pain, diarrhea, nausea and vomiting.  Musculoskeletal: Negative for back pain and gait problem.  Skin: Negative for rash.  Neurological: Negative for dizziness, light-headedness and headaches.  All other systems reviewed and are negative.   Per HPI unless specifically indicated above   Allergies as of 04/20/2019   No Known Allergies     Medication List       Accurate as of April 20, 2019 10:22 AM. Always use your most recent med list.        amoxicillin-clavulanate 250-62.5 MG/5ML suspension Commonly known as:  AUGMENTIN Take 5 mLs (250 mg total) by mouth 3 (three) times daily for 6 days.   feeding supplement Liqd Place 237 mLs into feeding tube 4 (four) times daily.   free water Soln Place 300 mLs into feeding tube every 8 (eight) hours.   HYDROcodone-acetaminophen 7.5-325 mg/15 ml solution Commonly known as:  HYCET TAKE (2) TEASPOONFULS EVERY FOUR HOURS AS NEEDED FOR PAIN.   KEYTRUDA IV Inject 200 mg into the vein every 21 ( twenty-one) days.   levothyroxine 100 MCG tablet Commonly known as:  SYNTHROID Place 1 tablet (100 mcg total) into feeding tube daily before breakfast.   magic mouthwash Soln Take 5 mLs by mouth 3 (three) times daily as needed for mouth pain.   nystatin 100000   BP 120/79   Pulse (!) 102   Temp 98 F (36.7 C) (Oral)   Ht 6' 1" (1.854 m)   Wt 109 lb 12.8 oz (49.8 kg)   BMI 14.49 kg/m    Subjective:   Patient ID: Brandon Hall, male    DOB: 07/09/1956, 63 y.o.   MRN: [id number]  HPI: Brandon Hall is a 63 y.o. male presenting on 04/20/2019 for Hospitalization Follow-up (AP 4/16)   HPI Patient is coming in for hospital follow-up.  He was admitted on 04/14/2019 and discharged on 04/15/2019.  During the hospitalization he was found to be malnourished and hyponatremic and dehydrated and had an aspiration pneumonia from vomiting and nausea.  He is currently under treatment for chemotherapy for head and neck cancer and does not appear to be improving based on imaging on the chemo and having a lot of side effects from the chemo.  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  Patient says since leaving the hospital he is not really having any cough or shortness of breath.  His niece is here with him who is providing a lot of the history because patient is nonverbal because of head and neck cancer.  He can answer with nods and hand gestures somewhat.  He has been having increasing swelling in the right side of his jaw and increasing difficulty with swallowing and has trouble with most things besides liquids at this point.  He does have pain medication and has just taken it right before coming been using it as needed but the pain right today is not controlled.  Relevant past medical, surgical, family and social history reviewed and updated as indicated. Interim medical history since our last visit reviewed. Allergies and medications reviewed and updated.  Review of Systems  Constitutional: Negative for chills and fever.  HENT: Positive for facial swelling and trouble swallowing.   Respiratory: Negative for cough,  shortness of breath and wheezing.   Cardiovascular: Negative for chest pain and leg swelling.  Gastrointestinal: Negative for abdominal pain, diarrhea, nausea and vomiting.  Musculoskeletal: Negative for back pain and gait problem.  Skin: Negative for rash.  Neurological: Negative for dizziness, light-headedness and headaches.  All other systems reviewed and are negative.   Per HPI unless specifically indicated above   Allergies as of 04/20/2019   No Known Allergies     Medication List       Accurate as of April 20, 2019 10:22 AM. Always use your most recent med list.        amoxicillin-clavulanate 250-62.5 MG/5ML suspension Commonly known as:  AUGMENTIN Take 5 mLs (250 mg total) by mouth 3 (three) times daily for 6 days.   feeding supplement Liqd Place 237 mLs into feeding tube 4 (four) times daily.   free water Soln Place 300 mLs into feeding tube every 8 (eight) hours.   HYDROcodone-acetaminophen 7.5-325 mg/15 ml solution Commonly known as:  HYCET TAKE (2) TEASPOONFULS EVERY FOUR HOURS AS NEEDED FOR PAIN.   KEYTRUDA IV Inject 200 mg into the vein every 21 ( twenty-one) days.   levothyroxine 100 MCG tablet Commonly known as:  SYNTHROID Place 1 tablet (100 mcg total) into feeding tube daily before breakfast.   magic mouthwash Soln Take 5 mLs by mouth 3 (three) times daily as needed for mouth pain.   nystatin 100000    BP 120/79   Pulse (!) 102   Temp 98 F (36.7 C) (Oral)   Ht 6' 1" (1.854 m)   Wt 109 lb 12.8 oz (49.8 kg)   BMI 14.49 kg/m    Subjective:   Patient ID: Brandon Hall, male    DOB: 07/09/1956, 63 y.o.   MRN: [id number]  HPI: Brandon Hall is a 63 y.o. male presenting on 04/20/2019 for Hospitalization Follow-up (AP 4/16)   HPI Patient is coming in for hospital follow-up.  He was admitted on 04/14/2019 and discharged on 04/15/2019.  During the hospitalization he was found to be malnourished and hyponatremic and dehydrated and had an aspiration pneumonia from vomiting and nausea.  He is currently under treatment for chemotherapy for head and neck cancer and does not appear to be improving based on imaging on the chemo and having a lot of side effects from the chemo.  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  Patient says since leaving the hospital he is not really having any cough or shortness of breath.  His niece is here with him who is providing a lot of the history because patient is nonverbal because of head and neck cancer.  He can answer with nods and hand gestures somewhat.  He has been having increasing swelling in the right side of his jaw and increasing difficulty with swallowing and has trouble with most things besides liquids at this point.  He does have pain medication and has just taken it right before coming been using it as needed but the pain right today is not controlled.  Relevant past medical, surgical, family and social history reviewed and updated as indicated. Interim medical history since our last visit reviewed. Allergies and medications reviewed and updated.  Review of Systems  Constitutional: Negative for chills and fever.  HENT: Positive for facial swelling and trouble swallowing.   Respiratory: Negative for cough,  shortness of breath and wheezing.   Cardiovascular: Negative for chest pain and leg swelling.  Gastrointestinal: Negative for abdominal pain, diarrhea, nausea and vomiting.  Musculoskeletal: Negative for back pain and gait problem.  Skin: Negative for rash.  Neurological: Negative for dizziness, light-headedness and headaches.  All other systems reviewed and are negative.   Per HPI unless specifically indicated above   Allergies as of 04/20/2019   No Known Allergies     Medication List       Accurate as of April 20, 2019 10:22 AM. Always use your most recent med list.        amoxicillin-clavulanate 250-62.5 MG/5ML suspension Commonly known as:  AUGMENTIN Take 5 mLs (250 mg total) by mouth 3 (three) times daily for 6 days.   feeding supplement Liqd Place 237 mLs into feeding tube 4 (four) times daily.   free water Soln Place 300 mLs into feeding tube every 8 (eight) hours.   HYDROcodone-acetaminophen 7.5-325 mg/15 ml solution Commonly known as:  HYCET TAKE (2) TEASPOONFULS EVERY FOUR HOURS AS NEEDED FOR PAIN.   KEYTRUDA IV Inject 200 mg into the vein every 21 ( twenty-one) days.   levothyroxine 100 MCG tablet Commonly known as:  SYNTHROID Place 1 tablet (100 mcg total) into feeding tube daily before breakfast.   magic mouthwash Soln Take 5 mLs by mouth 3 (three) times daily as needed for mouth pain.   nystatin 100000

## 2019-04-21 ENCOUNTER — Emergency Department (HOSPITAL_COMMUNITY): Payer: Medicare HMO

## 2019-04-21 ENCOUNTER — Encounter (HOSPITAL_COMMUNITY): Payer: Self-pay

## 2019-04-21 ENCOUNTER — Inpatient Hospital Stay (HOSPITAL_COMMUNITY)
Admission: EM | Admit: 2019-04-21 | Discharge: 2019-04-26 | DRG: 640 | Disposition: A | Discharge status: Home Health | Payer: Medicare HMO | Attending: Internal Medicine | Admitting: Internal Medicine

## 2019-04-21 ENCOUNTER — Other Ambulatory Visit: Payer: Self-pay

## 2019-04-21 DIAGNOSIS — Z79890 Hormone replacement therapy (postmenopausal): Secondary | ICD-10-CM | POA: Diagnosis not present

## 2019-04-21 DIAGNOSIS — M1990 Unspecified osteoarthritis, unspecified site: Secondary | ICD-10-CM | POA: Diagnosis present

## 2019-04-21 DIAGNOSIS — M1991 Primary osteoarthritis, unspecified site: Secondary | ICD-10-CM | POA: Diagnosis not present

## 2019-04-21 DIAGNOSIS — E872 Acidosis, unspecified: Secondary | ICD-10-CM | POA: Diagnosis present

## 2019-04-21 DIAGNOSIS — R1310 Dysphagia, unspecified: Secondary | ICD-10-CM | POA: Diagnosis present

## 2019-04-21 DIAGNOSIS — R4020 Unspecified coma: Secondary | ICD-10-CM | POA: Diagnosis not present

## 2019-04-21 DIAGNOSIS — Z66 Do not resuscitate: Secondary | ICD-10-CM | POA: Diagnosis present

## 2019-04-21 DIAGNOSIS — Z8619 Personal history of other infectious and parasitic diseases: Secondary | ICD-10-CM

## 2019-04-21 DIAGNOSIS — E871 Hypo-osmolality and hyponatremia: Secondary | ICD-10-CM | POA: Diagnosis present

## 2019-04-21 DIAGNOSIS — N400 Enlarged prostate without lower urinary tract symptoms: Secondary | ICD-10-CM

## 2019-04-21 DIAGNOSIS — Z931 Gastrostomy status: Secondary | ICD-10-CM

## 2019-04-21 DIAGNOSIS — Z8701 Personal history of pneumonia (recurrent): Secondary | ICD-10-CM

## 2019-04-21 DIAGNOSIS — R680 Hypothermia, not associated with low environmental temperature: Secondary | ICD-10-CM | POA: Diagnosis present

## 2019-04-21 DIAGNOSIS — R64 Cachexia: Secondary | ICD-10-CM | POA: Diagnosis present

## 2019-04-21 DIAGNOSIS — Z515 Encounter for palliative care: Secondary | ICD-10-CM | POA: Diagnosis not present

## 2019-04-21 DIAGNOSIS — Z681 Body mass index (BMI) 19 or less, adult: Secondary | ICD-10-CM | POA: Diagnosis not present

## 2019-04-21 DIAGNOSIS — C329 Malignant neoplasm of larynx, unspecified: Secondary | ICD-10-CM | POA: Diagnosis present

## 2019-04-21 DIAGNOSIS — E162 Hypoglycemia, unspecified: Secondary | ICD-10-CM | POA: Diagnosis present

## 2019-04-21 DIAGNOSIS — Z79899 Other long term (current) drug therapy: Secondary | ICD-10-CM | POA: Diagnosis not present

## 2019-04-21 DIAGNOSIS — C770 Secondary and unspecified malignant neoplasm of lymph nodes of head, face and neck: Secondary | ICD-10-CM | POA: Diagnosis present

## 2019-04-21 DIAGNOSIS — G9340 Encephalopathy, unspecified: Secondary | ICD-10-CM | POA: Diagnosis not present

## 2019-04-21 DIAGNOSIS — I959 Hypotension, unspecified: Secondary | ICD-10-CM | POA: Diagnosis not present

## 2019-04-21 DIAGNOSIS — R4182 Altered mental status, unspecified: Secondary | ICD-10-CM | POA: Diagnosis present

## 2019-04-21 DIAGNOSIS — M7989 Other specified soft tissue disorders: Secondary | ICD-10-CM | POA: Diagnosis not present

## 2019-04-21 DIAGNOSIS — Z8546 Personal history of malignant neoplasm of prostate: Secondary | ICD-10-CM

## 2019-04-21 DIAGNOSIS — Z923 Personal history of irradiation: Secondary | ICD-10-CM

## 2019-04-21 DIAGNOSIS — Z85818 Personal history of malignant neoplasm of other sites of lip, oral cavity, and pharynx: Secondary | ICD-10-CM

## 2019-04-21 DIAGNOSIS — Z800 Family history of malignant neoplasm of digestive organs: Secondary | ICD-10-CM

## 2019-04-21 DIAGNOSIS — G9341 Metabolic encephalopathy: Secondary | ICD-10-CM | POA: Diagnosis present

## 2019-04-21 DIAGNOSIS — C140 Malignant neoplasm of pharynx, unspecified: Secondary | ICD-10-CM | POA: Diagnosis not present

## 2019-04-21 DIAGNOSIS — Z7189 Other specified counseling: Secondary | ICD-10-CM | POA: Diagnosis not present

## 2019-04-21 DIAGNOSIS — Z7401 Bed confinement status: Secondary | ICD-10-CM | POA: Diagnosis not present

## 2019-04-21 DIAGNOSIS — E559 Vitamin D deficiency, unspecified: Secondary | ICD-10-CM | POA: Diagnosis not present

## 2019-04-21 DIAGNOSIS — E039 Hypothyroidism, unspecified: Secondary | ICD-10-CM | POA: Diagnosis present

## 2019-04-21 DIAGNOSIS — R404 Transient alteration of awareness: Secondary | ICD-10-CM | POA: Diagnosis present

## 2019-04-21 DIAGNOSIS — E860 Dehydration: Secondary | ICD-10-CM | POA: Diagnosis present

## 2019-04-21 DIAGNOSIS — R7989 Other specified abnormal findings of blood chemistry: Secondary | ICD-10-CM | POA: Diagnosis present

## 2019-04-21 DIAGNOSIS — E43 Unspecified severe protein-calorie malnutrition: Secondary | ICD-10-CM | POA: Diagnosis present

## 2019-04-21 DIAGNOSIS — N401 Enlarged prostate with lower urinary tract symptoms: Secondary | ICD-10-CM | POA: Diagnosis not present

## 2019-04-21 DIAGNOSIS — Z8249 Family history of ischemic heart disease and other diseases of the circulatory system: Secondary | ICD-10-CM

## 2019-04-21 DIAGNOSIS — E46 Unspecified protein-calorie malnutrition: Secondary | ICD-10-CM | POA: Diagnosis present

## 2019-04-21 DIAGNOSIS — E890 Postprocedural hypothyroidism: Secondary | ICD-10-CM | POA: Diagnosis present

## 2019-04-21 DIAGNOSIS — Z9221 Personal history of antineoplastic chemotherapy: Secondary | ICD-10-CM

## 2019-04-21 DIAGNOSIS — I10 Essential (primary) hypertension: Secondary | ICD-10-CM | POA: Diagnosis present

## 2019-04-21 DIAGNOSIS — R918 Other nonspecific abnormal finding of lung field: Secondary | ICD-10-CM | POA: Diagnosis not present

## 2019-04-21 DIAGNOSIS — H5461 Unqualified visual loss, right eye, normal vision left eye: Secondary | ICD-10-CM | POA: Diagnosis present

## 2019-04-21 DIAGNOSIS — Z87891 Personal history of nicotine dependence: Secondary | ICD-10-CM

## 2019-04-21 DIAGNOSIS — E785 Hyperlipidemia, unspecified: Secondary | ICD-10-CM | POA: Diagnosis present

## 2019-04-21 DIAGNOSIS — J439 Emphysema, unspecified: Secondary | ICD-10-CM | POA: Diagnosis not present

## 2019-04-21 DIAGNOSIS — R569 Unspecified convulsions: Secondary | ICD-10-CM | POA: Diagnosis not present

## 2019-04-21 HISTORY — DX: Aphasia: R47.01

## 2019-04-21 LAB — CBC WITH DIFFERENTIAL/PLATELET
Abs Immature Granulocytes: 0.04 10*3/uL (ref 0.00–0.07)
Basophils Absolute: 0 10*3/uL (ref 0.0–0.1)
Basophils Relative: 0 %
Eosinophils Absolute: 0 10*3/uL (ref 0.0–0.5)
Eosinophils Relative: 0 %
HCT: 45.1 % (ref 39.0–52.0)
Hemoglobin: 14 g/dL (ref 13.0–17.0)
Immature Granulocytes: 0 %
Lymphocytes Relative: 9 %
Lymphs Abs: 0.8 10*3/uL (ref 0.7–4.0)
MCH: 25.9 pg — ABNORMAL LOW (ref 26.0–34.0)
MCHC: 31 g/dL (ref 30.0–36.0)
MCV: 83.5 fL (ref 80.0–100.0)
Monocytes Absolute: 0.7 10*3/uL (ref 0.1–1.0)
Monocytes Relative: 8 %
Neutro Abs: 7.3 10*3/uL (ref 1.7–7.7)
Neutrophils Relative %: 83 %
Platelets: 374 10*3/uL (ref 150–400)
RBC: 5.4 MIL/uL (ref 4.22–5.81)
RDW: 14.5 % (ref 11.5–15.5)
WBC: 8.9 10*3/uL (ref 4.0–10.5)
nRBC: 0 % (ref 0.0–0.2)

## 2019-04-21 LAB — URINALYSIS, ROUTINE W REFLEX MICROSCOPIC
Bacteria, UA: NONE SEEN
Bilirubin Urine: NEGATIVE
Glucose, UA: NEGATIVE mg/dL
Ketones, ur: 5 mg/dL — AB
Leukocytes,Ua: NEGATIVE
Nitrite: NEGATIVE
Protein, ur: NEGATIVE mg/dL
RBC / HPF: >50 RBC/hpf — ABNORMAL HIGH (ref 0–5)
Specific Gravity, Urine: 1.006 (ref 1.005–1.030)
pH: 8 (ref 5.0–8.0)

## 2019-04-21 LAB — TROPONIN I: Troponin I: <0.03 ng/mL (ref <0.03)

## 2019-04-21 LAB — BASIC METABOLIC PANEL
Anion gap: 13 (ref 5–15)
BUN: 16 mg/dL (ref 8–23)
CO2: 25 mmol/L (ref 22–32)
Calcium: 9.1 mg/dL (ref 8.9–10.3)
Chloride: 95 mmol/L — ABNORMAL LOW (ref 98–111)
Creatinine, Ser: 0.75 mg/dL (ref 0.61–1.24)
GFR calc Af Amer: >60 mL/min (ref >60)
GFR calc non Af Amer: >60 mL/min (ref >60)
Glucose, Bld: 101 mg/dL — ABNORMAL HIGH (ref 70–99)
Potassium: 3.6 mmol/L (ref 3.5–5.1)
Sodium: 133 mmol/L — ABNORMAL LOW (ref 135–145)

## 2019-04-21 LAB — COMPREHENSIVE METABOLIC PANEL
ALT: 40 U/L (ref 0–44)
AST: 35 U/L (ref 15–41)
Albumin: 4.2 g/dL (ref 3.5–5.0)
Alkaline Phosphatase: 100 U/L (ref 38–126)
Anion gap: 17 — ABNORMAL HIGH (ref 5–15)
BUN: 21 mg/dL (ref 8–23)
CO2: 23 mmol/L (ref 22–32)
Calcium: 10 mg/dL (ref 8.9–10.3)
Chloride: 89 mmol/L — ABNORMAL LOW (ref 98–111)
Creatinine, Ser: 0.9 mg/dL (ref 0.61–1.24)
GFR calc Af Amer: >60 mL/min (ref >60)
GFR calc non Af Amer: >60 mL/min (ref >60)
Glucose, Bld: 101 mg/dL — ABNORMAL HIGH (ref 70–99)
Potassium: 3.4 mmol/L — ABNORMAL LOW (ref 3.5–5.1)
Sodium: 129 mmol/L — ABNORMAL LOW (ref 135–145)
Total Bilirubin: 0.4 mg/dL (ref 0.3–1.2)
Total Protein: 8.2 g/dL — ABNORMAL HIGH (ref 6.5–8.1)

## 2019-04-21 LAB — RAPID URINE DRUG SCREEN, HOSP PERFORMED
Amphetamines: NOT DETECTED
Barbiturates: NOT DETECTED
Benzodiazepines: NOT DETECTED
Cocaine: NOT DETECTED
Opiates: POSITIVE — AB
Tetrahydrocannabinol: NOT DETECTED

## 2019-04-21 LAB — LACTIC ACID, PLASMA
Lactic Acid, Venous: 3.7 mmol/L (ref 0.5–1.9)
Lactic Acid, Venous: 3.4 mmol/L (ref 0.5–1.9)
Lactic Acid, Venous: 2.4 mmol/L (ref 0.5–1.9)

## 2019-04-21 LAB — PROTIME-INR
INR: 1 (ref 0.8–1.2)
Prothrombin Time: 13.5 seconds (ref 11.4–15.2)

## 2019-04-21 LAB — PHOSPHORUS: Phosphorus: 3.6 mg/dL (ref 2.5–4.6)

## 2019-04-21 LAB — ETHANOL: Alcohol, Ethyl (B): <10 mg/dL (ref <10)

## 2019-04-21 LAB — CBG MONITORING, ED: Glucose-Capillary: 89 mg/dL (ref 70–99)

## 2019-04-21 LAB — ACETAMINOPHEN LEVEL: Acetaminophen (Tylenol), Serum: <10 ug/mL — ABNORMAL LOW (ref 10–30)

## 2019-04-21 LAB — SALICYLATE LEVEL: Salicylate Lvl: <7 mg/dL (ref 2.8–30.0)

## 2019-04-21 LAB — MAGNESIUM: Magnesium: 2.3 mg/dL (ref 1.7–2.4)

## 2019-04-21 MED ORDER — SODIUM CHLORIDE 0.9 % IV BOLUS
1000.0000 mL | Freq: Once | INTRAVENOUS | Status: AC
  Administered 2019-04-21: 1000 mL via INTRAVENOUS

## 2019-04-21 MED ORDER — PRAVASTATIN SODIUM 40 MG PO TABS
40.0000 mg | ORAL_TABLET | Freq: Every day | ORAL | Status: DC
  Administered 2019-04-22 – 2019-04-26 (×5): 40 mg
  Filled 2019-04-21 (×5): qty 1

## 2019-04-21 MED ORDER — SODIUM CHLORIDE 0.9 % IV BOLUS
30.0000 mL/kg | Freq: Once | INTRAVENOUS | Status: AC
  Administered 2019-04-21: 1494 mL via INTRAVENOUS

## 2019-04-21 MED ORDER — ONDANSETRON HCL 4 MG PO TABS: 4.0000 mg | ORAL_TABLET | Freq: Four times a day (QID) | ORAL | Status: DC | PRN

## 2019-04-21 MED ORDER — ALBUTEROL SULFATE (2.5 MG/3ML) 0.083% IN NEBU: 2.5000 mg | INHALATION_SOLUTION | RESPIRATORY_TRACT | Status: DC | PRN

## 2019-04-21 MED ORDER — JEVITY 1.5 CAL/FIBER PO LIQD
237.0000 mL | Freq: Four times a day (QID) | ORAL | Status: DC
  Filled 2019-04-21 (×3): qty 1000

## 2019-04-21 MED ORDER — FREE WATER
300.0000 mL | Freq: Three times a day (TID) | Status: DC
  Administered 2019-04-22 – 2019-04-26 (×15): 300 mL

## 2019-04-21 MED ORDER — POTASSIUM CHLORIDE IN NACL 40-0.9 MEQ/L-% IV SOLN
INTRAVENOUS | Status: DC
  Administered 2019-04-22: 02:00:00 100 mL/h via INTRAVENOUS
  Administered 2019-04-23 – 2019-04-24 (×2): 35 mL/h via INTRAVENOUS
  Administered 2019-04-25: 18:00:00 75 mL/h via INTRAVENOUS
  Filled 2019-04-21 (×2): qty 1000

## 2019-04-21 MED ORDER — HYDROCODONE-ACETAMINOPHEN 7.5-325 MG/15ML PO SOLN: 10.0000 mL | ORAL | Status: DC | PRN

## 2019-04-21 MED ORDER — SODIUM CHLORIDE 0.9 % IV SOLN: INTRAVENOUS | Status: DC

## 2019-04-21 MED ORDER — POTASSIUM CHLORIDE 10 MEQ/100ML IV SOLN
10.0000 meq | INTRAVENOUS | Status: AC
  Administered 2019-04-22 (×2): 10 meq via INTRAVENOUS
  Filled 2019-04-21 (×2): qty 100

## 2019-04-21 MED ORDER — ONDANSETRON HCL 4 MG/2ML IJ SOLN
4.0000 mg | Freq: Four times a day (QID) | INTRAMUSCULAR | Status: DC | PRN
  Administered 2019-04-23: 4 mg via INTRAVENOUS
  Filled 2019-04-21: qty 2

## 2019-04-21 MED ORDER — LEVOTHYROXINE SODIUM 100 MCG PO TABS
100.0000 ug | ORAL_TABLET | Freq: Every day | ORAL | Status: DC
  Administered 2019-04-22: 100 ug
  Filled 2019-04-21: qty 1

## 2019-04-21 MED ORDER — POLYETHYLENE GLYCOL 3350 17 G PO PACK: 17.0000 g | PACK | Freq: Every day | ORAL | Status: DC | PRN

## 2019-04-21 MED ORDER — PROCHLORPERAZINE MALEATE 5 MG PO TABS: 5.0000 mg | ORAL_TABLET | Freq: Four times a day (QID) | ORAL | Status: DC | PRN

## 2019-04-21 MED ORDER — NYSTATIN 100000 UNIT/ML MT SUSP
5.0000 mL | Freq: Four times a day (QID) | OROMUCOSAL | Status: DC
  Administered 2019-04-22 – 2019-04-26 (×20): 500000 [IU] via ORAL
  Filled 2019-04-21 (×20): qty 5

## 2019-04-21 MED ORDER — MAGIC MOUTHWASH: 5.0000 mL | Freq: Three times a day (TID) | ORAL | Status: DC | PRN

## 2019-04-21 MED ORDER — SODIUM CHLORIDE 0.9 % IV BOLUS
1000.0000 mL | Freq: Once | INTRAVENOUS | Status: AC
  Administered 2019-04-22: 1000 mL via INTRAVENOUS

## 2019-04-21 MED ORDER — ENOXAPARIN SODIUM 40 MG/0.4ML ~~LOC~~ SOLN
40.0000 mg | SUBCUTANEOUS | Status: DC
  Administered 2019-04-22 – 2019-04-25 (×5): 40 mg via SUBCUTANEOUS
  Filled 2019-04-21 (×5): qty 0.4

## 2019-04-21 NOTE — ED Notes (Signed)
Pt new temp 97.7 after return from CT. Trial removal of Bair Hugger per edp

## 2019-04-21 NOTE — H&P (Signed)
History and Physical    Winford Wacha NWG:[id number] DOB: 02-21-1956 DOA: 04/21/2019  PCP: Dettinger, Elige Radon, MD   Patient coming from: Home.  I have personally briefly reviewed patient's old medical records in [facility]  Chief Complaint: AMS.  HPI: Perkins Edge is a 63 y.o. male with medical history significant of osteoarthritis, blindness of the right eye, pharyngeal cancer, history of radiation therapy to the neck, history of postradiation hypothyroidism, history of herpes zoster, hypertension, prostate cancer who was brought to the emergency department via EMS after a sliding from his recliner at home and becoming stiff and transiently altered.  The patient is nonverbal and unable to provide further information.  His wife did not add any further information to the history.  EMS did not describe the patient as being postictal and there is no history of known seizure disorder.  ED Course: His initial vital signs temperature 96.2 F, pulse 79, respirations 11, blood pressure 128/76 mmHg and O2 sat 100% on room air.  The patient was given about 2500 mL's of normal saline in the emergency department.  Blood cultures x2 were drawn.  His urinalysis showed moderate hemoglobinuria, ketonuria 5 mg/dL with more than 50 RBC per hpf on microscopic examination.  UDS was positive for opiates due to the patient's prescribed hydrocodone use.  CBC showed a white count was 8.9 with 83% neutrophils, 9% lymphocytes 8% monocytes.  Hemoglobin was 14.0 g/dL and platelets 578.  PT and INR within expected limits.  Alcohol, salicylate, acetaminophen and troponin levels were normal.  Lactic acid was 3.7, then 3.4 and most recently 2.4 mmol/L.  Sodium 129, potassium 3.4, chloride 89 and CO2 23 mmol/L.  Anion gap was 17.  Glucose is 101 mg/dL and total protein 8.2 g/dL.  The rest of the CMP values, including hepatic functions are within normal limits.  Review of Systems: As per HPI otherwise 10 point review of systems  negative.   Past Medical History:  Diagnosis Date  . Arthritis   . Blindness of right eye   . Cancer (HCC)    pharyngeal ca  . History of radiation therapy 11/2014   [id number] squamous cell carcinoma of L neck, Stage IVB, unknown primary 70 Gy in 35 fractions treated at [facility], finished 12/25/14  . History of shingles    RASH  02-03-2014  PER PCP RESOLVING  . Hypertension    NO MEDS ,  MONITORED BY PCP  . Hypothyroidism, postradioiodine therapy   . Nonverbal   . Prostate cancer (HCC) 07/12/2014   DX  SEVERAL  YRS   AGO  WITH RADIATION THERAPY  . Squamous cell carcinoma of head and neck (HCC) 07/12/2014  . 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 COLONOSCOPY  10/20/2011   Procedure: COLONOSCOPY;  Surgeon: Corbin Ade, MD;  [facility];  Service: Endoscopy;  Laterality: N/A;  1:00 pm  . COLONOSCOPY N/A 02/03/2018   Procedure: COLONOSCOPY;  Surgeon: Corbin Ade, MD;  Location: [facility];  Service: Endoscopy;  Laterality: N/A;  2:15  . CRYOABLATION N/A 04/07/2014   Procedure: SALVAGE CRYO ABLATION PROSTATE;  Surgeon: Su Grand, MD;  Location: [facility];  Service: Urology;  Laterality: N/A;  . DIRECT LARYNGOSCOPY N/A 09/06/2018   Procedure: DIRECT LARYNGOSCOPY biopsy with frozen section;  Surgeon: Serena Colonel, MD;  Location: [facility] OR;  Service: ENT;  Laterality: N/A;  . IR GASTROSTOMY TUBE  MOD SED  08/20/2018  . IR GENERIC HISTORICAL  03/27/2017   IR REMOVAL TUN ACCESS W/ PORT W/O FL MOD SED [facility]-INTERV RAD  . LARYNGETOMY N/A 09/06/2018   Procedure: total LARYNGECTOMY;  Surgeon: Serena Colonel, MD;  Location: [facility] OR;  Service: ENT;  Laterality: N/A;  . LARYNGOSCOPY    . LYMPH NODE BIOPSY    . RADICAL NECK DISSECTION Right 09/06/2018   Procedure: Modified NECK DISSECTION;  Surgeon: Serena Colonel, MD;  Location: [facility] OR;  Service: ENT;  Laterality: Right;     reports that he quit smoking  about 5 years ago. His smoking use included cigarettes. He has a 40.00 pack-year smoking history. He has never used smokeless tobacco. He reports that he does not drink alcohol or use drugs.  No Known Allergies  Family History  Problem Relation Age of Onset  . Colon cancer Father   . Hypertension Mother   . Colon cancer Brother   . Colon cancer Brother    Prior to Admission medications   Medication Sig Start Date End Date Taking? Authorizing Provider  amoxicillin-clavulanate (AUGMENTIN) 250-62.5 MG/5ML suspension Take 5 mLs (250 mg total) by mouth 3 (three) times daily for 6 days. 04/15/19 04/21/19 Yes Vassie Loll, MD  HYDROcodone-acetaminophen (HYCET) 7.5-325 mg/15 ml solution TAKE (2) TEASPOONFULS EVERY FOUR HOURS AS NEEDED FOR PAIN. Patient taking differently: Place 10 mLs into feeding tube every 4 (four) hours as needed for moderate pain.  04/11/19  Yes Lockamy, Randi L, NP-C  levothyroxine (SYNTHROID, LEVOTHROID) 100 MCG tablet Place 1 tablet (100 mcg total) into feeding tube daily before breakfast. 11/11/18  Yes Dettinger, Elige Radon, MD  magic mouthwash SOLN Take 5 mLs by mouth 3 (three) times daily as needed for mouth pain. 03/21/19  Yes Dettinger, Elige Radon, MD  Nutritional Supplements (FEEDING SUPPLEMENT, JEVITY 1.5 CAL,) LIQD Place 237 mLs into feeding tube 4 (four) times daily.   Yes [provider]  nystatin (MYCOSTATIN) 100000 UNIT/ML suspension Take 5 mLs (500,000 Units total) by mouth 4 (four) times daily. 03/16/19  Yes Dettinger, Elige Radon, MD  Pembrolizumab [facility] IV) Inject 200 mg into the vein every 21 ( twenty-one) days.    Yes [provider]  polyethylene glycol (MIRALAX / GLYCOLAX) packet Take 17 g by mouth daily as needed for mild constipation or moderate constipation.    Yes [provider]  pravastatin (PRAVACHOL) 40 MG tablet Take 1 tablet (40 mg total) by mouth daily. Patient taking differently: Place 40 mg into feeding tube daily.  11/11/18   Yes Dettinger, Elige Radon, MD  prochlorperazine (COMPAZINE) 10 MG tablet Take 1 tablet (10 mg total) by mouth every 6 (six) hours as needed (Nausea or vomiting). Patient taking differently: Place 5-10 mg into feeding tube every 6 (six) hours as needed for nausea or vomiting.  02/07/19  Yes Doreatha Massed, MD  Vitamin D, Ergocalciferol, (DRISDOL) 1.25 MG (50000 UT) CAPS capsule TAKE 1 CAPSULE EVERY 7 DAYS. Patient taking differently: Place 50,000 Units into feeding tube every 7 (seven) days.  02/23/19  Yes Dettinger, Elige Radon, MD  Water For Irrigation, Sterile (FREE WATER) SOLN Place 300 mLs into feeding tube every 8 (eight) hours. 04/15/19  Yes Vassie Loll, MD    Physical Exam: Vitals:   04/21/19 2000 04/21/19 2030 04/21/19 2154 04/21/19 2200  BP: 131/81 (!) 151/92  113/79  Pulse:    80  Resp:    20  Temp:   97.7 F (36.5 C)   TempSrc:  SpO2:   100%     Constitutional: Cachectic, looks chronically ill, but in NAD, calm, comfortable Eyes: Right globe blindness and opacification, lids and conjunctivae normal ENMT: Mucous membranes are moist. Posterior pharynx clear of any exudate or lesions. Neck: Positive tracheostomy, supple, no masses, no thyromegaly Respiratory: clear to auscultation bilaterally, no wheezing, no crackles. Normal respiratory effort. No accessory muscle use.  Cardiovascular: Regular rate and rhythm, no murmurs / rubs / gallops. No extremity edema. 2+ pedal pulses. No carotid bruits.  Abdomen: PEG tube in place, soft, no tenderness, no masses palpated. No hepatosplenomegaly. Bowel sounds positive.  Musculoskeletal: no clubbing / cyanosis. Good ROM, no contractures. Normal muscle tone.  Skin: no rashes, lesions, ulcers. No induration Neurologic: CN 2-12 grossly intact. Sensation intact, DTR normal. Strength 5/5 in all 4.  Psychiatric: Alert, knows he is in the hospital, mood seems to be baseline.  Labs on Admission: I have personally reviewed following labs and  imaging studies  CBC: Recent Labs  Lab 04/20/19 1036 04/21/19 1621  WBC 4.9 8.9  NEUTROABS 3.4 7.3  HGB 13.9 14.0  HCT 42.8 45.1  MCV 79 83.5  PLT 359 374   Basic Metabolic Panel: Recent Labs  Lab 04/20/19 1036 04/21/19 1621  NA 132* 129*  K 4.0 3.4*  CL 90* 89*  CO2 25 23  GLUCOSE 83 101*  BUN 15 21  CREATININE 0.83 0.90  CALCIUM 10.4* 10.0   GFR: Estimated Creatinine Clearance: 58.4 mL/min (by C-G formula based on SCr of 0.9 mg/dL). Liver Function Tests: Recent Labs  Lab 04/20/19 1036 04/21/19 1621  AST 33 35  ALT 44 40  ALKPHOS 110 100  BILITOT 0.5 0.4  PROT 7.5 8.2*  ALBUMIN 4.4 4.2   No results for input(s): LIPASE, AMYLASE in the last 168 hours. No results for input(s): AMMONIA in the last 168 hours. Coagulation Profile: Recent Labs  Lab 04/21/19 1621  INR 1.0   Cardiac Enzymes: Recent Labs  Lab 04/21/19 1621  TROPONINI <0.03   BNP (last 3 results) No results for input(s): PROBNP in the last 8760 hours. HbA1C: No results for input(s): HGBA1C in the last 72 hours. CBG: Recent Labs  Lab 04/21/19 1547  GLUCAP 89   Lipid Profile: No results for input(s): CHOL, HDL, LDLCALC, TRIG, CHOLHDL, LDLDIRECT in the last 72 hours. Thyroid Function Tests: No results for input(s): TSH, T4TOTAL, FREET4, T3FREE, THYROIDAB in the last 72 hours. Anemia Panel: No results for input(s): VITAMINB12, FOLATE, FERRITIN, TIBC, IRON, RETICCTPCT in the last 72 hours. Urine analysis:    Component Value Date/Time   COLORURINE STRAW (A) 04/21/2019 1813   APPEARANCEUR CLEAR 04/21/2019 1813   APPEARANCEUR Cloudy (A) 05/12/2018 1347   LABSPEC 1.006 04/21/2019 1813   PHURINE 8.0 04/21/2019 1813   GLUCOSEU NEGATIVE 04/21/2019 1813   HGBUR MODERATE (A) 04/21/2019 1813   BILIRUBINUR NEGATIVE 04/21/2019 1813   BILIRUBINUR Negative 05/12/2018 1347   KETONESUR 5 (A) 04/21/2019 1813   PROTEINUR NEGATIVE 04/21/2019 1813   NITRITE NEGATIVE 04/21/2019 1813   LEUKOCYTESUR  NEGATIVE 04/21/2019 1813    Radiological Exams on Admission: Ct Head Wo Contrast  Result Date: 04/21/2019 CLINICAL DATA:  Encephalopathy EXAM: CT HEAD WITHOUT CONTRAST TECHNIQUE: Contiguous axial images were obtained from the base of the skull through the vertex without intravenous contrast. COMPARISON:  04/14/2019 FINDINGS: Brain: No acute intracranial abnormality. Specifically, no hemorrhage, hydrocephalus, mass lesion, acute infarction, or significant intracranial injury. Calcification in the inferior right cerebellum, stable Vascular: No hyperdense vessel or  unexpected calcification. Skull: No acute calvarial abnormality. Sinuses/Orbits: Mucosal thickening throughout the paranasal sinuses. Debris noted in the nasopharynx, stable since prior study. Other: None IMPRESSION: No acute intracranial abnormality. Electronically Signed   By: Charlett Nose M.D.   On: 04/21/2019 21:46   Dg Chest Port 1 View  Result Date: 04/21/2019 CLINICAL DATA:  Syncope. EXAM: PORTABLE CHEST 1 VIEW COMPARISON:  04/14/2019 FINDINGS: The heart size and mediastinal contours are within normal limits. Stable emphysematous lung disease. The lungs are hyperinflated bilaterally. There is no evidence of pulmonary edema, consolidation, pneumothorax, nodule or pleural fluid. The visualized skeletal structures are unremarkable. IMPRESSION: Emphysema and hyperinflation.  No acute findings. Electronically Signed   By: Irish Lack M.D.   On: 04/21/2019 16:37    EKG: Independently reviewed.  Vent. rate 86 BPM PR interval * ms QRS duration 145 ms QT/QTc 461/552 ms P-R-T axes 87 -87 90 Sinus rhythm Atrial premature complex Short PR interval Nonspecific IVCD with LAD Inferior infarct, old Abnormal lateral Q waves Baseline wander in lead(s) V6  Assessment/Plan Principal Problem:   Lactic acidosis Could have been related to seizure? Hypothermic.  Sepsis?  Hypothyroidism? Placing observation/telemetry overnight. Continue to  monitor temperature. Defer antibiotics for now. Just finished Augmentin for aspiration pneumonia. Follow-up WBC, lactic acid and CMP in a.m.  Active Problems:   Hypertension Currently not on medication. Hypertension likely not present due to cachexia. Monitor blood pressure.    Dyslipidemia Continue pravastatin 40 mg daily via PEG. Hepatic functions were essentially normal. Follow-up fasting lipids with primary as an outpatient.    Hypothyroid Continue levothyroxine 100 mcg via PEG daily. Recheck TSH given recent abnormalities followed by rapid correction. .   Hyponatremia Likely due to poor nutrition limited to PEG use. Continue normal saline infusion. Follow-up sodium level.   DVT prophylaxis: Lovenox SQ. Code Status: Full code. Family Communication: Disposition Plan: Observation for IV hydration and close monitoring. Consults called: Admission status: Observation/telemetry.   Bobette Mo MD [facility]  04/21/2019, 11:01 PM   This document was prepared using Dragon voice recognition software and may contain some unintended transcription errors.

## 2019-04-21 NOTE — ED Notes (Signed)
Pt watching tv

## 2019-04-21 NOTE — ED Provider Notes (Signed)
[facility] EMERGENCY DEPARTMENT Provider Note   CSN: [id number] Arrival date & time: 04/21/19  1520    History   Chief Complaint Chief Complaint  Patient presents with   Altered Mental Status    HPI Brandon Hall is a 63 y.o. male.     The history is provided by the EMS personnel and the spouse. The history is limited by the condition of the patient (Pt non-verbal).  Altered Mental Status   Pt was seen at 1540. Per EMS and pt's wife's report: Pt's wife states pt "stiffened up and slid out of the recliner."  No reported seizure activity. EMS states pt initially could not stand because he "was shaky" but then was able to stand. Pt is non-verbal per baseline and gestures his needs (ie: pointed to his chair when EMS was in his home). EMS states they have made several call outs to pt's home to help pt up. No reported fevers, cough, vomiting/diarrhea, focal motor weakness. Pt has significant hx of head and neck cancer with known increasing swelling on right jaw and dysphagia to all foods except liquids at baseline. Pt was discharged from the hospital last week for aspiration pneumonia. Pt was evaluated by his PMD yesterday with goals of care discussed, based on pt's overall poor prognosis, hospice was recommended (see below):   "We had a discussion about hospice and patient is desirable to go on to hospice and gave information and will contact [facility] to help with this.  Caryl Pina, MD [facility] 04/20/2019, 10:22 AM"     Past Medical History:  Diagnosis Date   Arthritis    Blindness of right eye    Cancer ([place])    pharyngeal ca   History of radiation therapy 11/2014   [id number] squamous cell carcinoma of L neck, Stage IVB, unknown primary 70 Gy in 35 fractions treated at [facility], finished 12/25/14   History of shingles    RASH  02-03-2014  PER PCP RESOLVING   Hypertension    NO MEDS ,  MONITORED BY PCP   Hypothyroidism, postradioiodine  therapy    Nonverbal    Prostate cancer ([place]) 07/12/2014   DX  SEVERAL  YRS   AGO  WITH RADIATION THERAPY   Squamous cell carcinoma of head and neck ([place]) 07/12/2014   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    Patient Active Problem List   Diagnosis Date Noted   Hyponatremia    Lactic acidosis    Vomiting in adult    Dehydration    Aspiration pneumonia of right middle lobe due to vomit ([place])    Tachycardia 04/14/2019   Goals of care, counseling/discussion 01/31/2019   DNR (do not resuscitate) 01/11/2019   Cachexia ([place]) 01/11/2019   Lung nodule 01/11/2019   Status post laryngectomy 09/06/2018   Severe protein-calorie malnutrition ([place]) 08/20/2018   Laryngeal cancer ([place]) 08/19/2018   Perianal abscess 08/10/2017   Vitamin D deficiency 02/08/2015   Squamous cell carcinoma of head and neck ([place]) 07/12/2014   History of prostate cancer 07/12/2014   Shingles rash 02/03/2014   Abnormal transaminases 10/02/2013   Hypothyroid 10/02/2013   Blind right eye 09/30/2013   Dyslipidemia 09/30/2013   Hypertension    Graves disease     Past Surgical History:  Procedure Laterality Date   COLONOSCOPY  10/20/2011   Procedure: COLONOSCOPY;  Surgeon: Daneil Dolin, MD;  [facility];  Service: Endoscopy;  Laterality: N/A;  1:00 pm  COLONOSCOPY N/A 02/03/2018   Procedure: COLONOSCOPY;  Surgeon: Daneil Dolin, MD;  [facility];  Service: Endoscopy;  Laterality: N/A;  2:15   CRYOABLATION N/A 04/07/2014   Procedure: SALVAGE CRYO ABLATION PROSTATE;  Surgeon: Lowella Bandy, MD;  Location: [facility];  Service: Urology;  Laterality: N/A;   DIRECT LARYNGOSCOPY N/A 09/06/2018   Procedure: DIRECT LARYNGOSCOPY biopsy with frozen section;  Surgeon: Izora Gala, MD;  Location: [facility];  Service: ENT;  Laterality: N/A;   IR GASTROSTOMY TUBE MOD SED  08/20/2018   IR GENERIC HISTORICAL  03/27/2017   IR REMOVAL TUN ACCESS W/ PORT  W/O FL MOD SED [facility]-INTERV RAD   LARYNGETOMY N/A 09/06/2018   Procedure: total LARYNGECTOMY;  Surgeon: Izora Gala, MD;  Location: [place];  Service: ENT;  Laterality: N/A;   LARYNGOSCOPY     LYMPH NODE BIOPSY     RADICAL NECK DISSECTION Right 09/06/2018   Procedure: Modified NECK DISSECTION;  Surgeon: Izora Gala, MD;  Location: [place];  Service: ENT;  Laterality: Right;        Home Medications    Prior to Admission medications   Medication Sig Start Date End Date Taking? Authorizing Provider  amoxicillin-clavulanate (AUGMENTIN) 250-62.5 MG/5ML suspension Take 5 mLs (250 mg total) by mouth 3 (three) times daily for 6 days. 04/15/19 04/21/19  Barton Dubois, MD  feeding supplement (OSMOLITE 1 CAL) LIQD Place 237 mLs into feeding tube 4 (four) times daily.    [provider]  HYDROcodone-acetaminophen (HYCET) 7.5-325 mg/15 ml solution TAKE (2) TEASPOONFULS EVERY FOUR HOURS AS NEEDED FOR PAIN. Patient taking differently: Place 10 mLs into feeding tube every 4 (four) hours as needed for moderate pain.  04/11/19   Glennie Isle, NP-C  levothyroxine (SYNTHROID, LEVOTHROID) 100 MCG tablet Place 1 tablet (100 mcg total) into feeding tube daily before breakfast. 11/11/18   Dettinger, Fransisca Kaufmann, MD  magic mouthwash SOLN Take 5 mLs by mouth 3 (three) times daily as needed for mouth pain. 03/21/19   Dettinger, Fransisca Kaufmann, MD  nystatin (MYCOSTATIN) 100000 UNIT/ML suspension Take 5 mLs (500,000 Units total) by mouth 4 (four) times daily. 03/16/19   Dettinger, Fransisca Kaufmann, MD  Pembrolizumab (KEYTRUDA IV) Inject 200 mg into the vein every 21 ( twenty-one) days.     [provider]  polyethylene glycol (MIRALAX / GLYCOLAX) packet Take 17 g by mouth daily as needed for mild constipation or moderate constipation.     [provider]  pravastatin (PRAVACHOL) 40 MG tablet Take 1 tablet (40 mg total) by mouth daily. Patient taking differently: Place 40 mg into feeding tube daily.  11/11/18    Dettinger, Fransisca Kaufmann, MD  prochlorperazine (COMPAZINE) 10 MG tablet Take 1 tablet (10 mg total) by mouth every 6 (six) hours as needed (Nausea or vomiting). 02/07/19   Derek Jack, MD  Vitamin D, Ergocalciferol, (DRISDOL) 1.25 MG (50000 UT) CAPS capsule TAKE 1 CAPSULE EVERY 7 DAYS. Patient taking differently: Place 50,000 Units into feeding tube every 7 (seven) days.  02/23/19   Dettinger, Fransisca Kaufmann, MD  Water For Irrigation, Sterile (FREE WATER) SOLN Place 300 mLs into feeding tube every 8 (eight) hours. 04/15/19   Barton Dubois, MD    Family History Family History  Problem Relation Age of Onset   Colon cancer Father    Hypertension Mother    Colon cancer Brother    Colon cancer Brother     Social History Social History   Tobacco Use  Smoking status: Former Smoker    Packs/day: 1.00    Years: 40.00    Pack years: 40.00    Types: Cigarettes    Last attempt to quit: 09/29/2013    Years since quitting: 5.5   Smokeless tobacco: Never Used  Substance Use Topics   Alcohol use: No   Drug use: No     Allergies   Patient has no known allergies.   Review of Systems Review of Systems  Unable to perform ROS: Patient nonverbal     Physical Exam Updated Vital Signs BP 128/76 (BP Location: Left Arm)    Pulse 79    Temp (!) 96.2 F (35.7 C) (Rectal)    Resp 14    SpO2 100%    Patient Vitals for the past 24 hrs:  BP Temp Temp src Pulse Resp SpO2  04/21/19 2200 113/79 -- -- -- -- --  04/21/19 2154 -- 97.7 F (36.5 C) -- -- -- --  04/21/19 2030 (!) 151/92 -- -- -- -- --  04/21/19 2000 131/81 -- -- -- -- --  04/21/19 1930 121/70 -- -- -- -- --  04/21/19 1915 107/70 -- -- -- -- --  04/21/19 1845 -- -- -- 80 -- 100 %  04/21/19 1830 (!) 167/98 -- -- 81 18 100 %  04/21/19 1830 (!) 173/95 -- -- -- -- --  04/21/19 1600 -- -- -- -- 14 --  04/21/19 1536 -- (!) 96.2 F (35.7 C) Rectal -- -- --  04/21/19 1527 -- -- -- 79 11 100 %  04/21/19 1524 128/76 -- -- -- -- --      Physical Exam 1545: Physical examination:  Nursing notes reviewed; Vital signs and O2 SAT reviewed;  Constitutional: Cachectic. In no acute distress; Head:  Normocephalic, atraumatic; Eyes: EOMI, PERRL, No scleral icterus; ENMT: Mouth and pharynx normal, Mucous membranes dry; Neck: Supple, Full range of motion, +stoma.; Cardiovascular: Regular rate and rhythm, No gallop; Respiratory: Breath sounds clear & equal bilaterally, No wheezes. Normal respiratory effort/excursion; Chest: Nontender, Movement normal; Abdomen: +PEG. Soft, Nontender, Nondistended, Normal bowel sounds; Genitourinary: No CVA tenderness; Extremities: Peripheral pulses normal, No deformity. No edema, No calf edema or asymmetry.; Neuro: Awake, alert. No facial droop. Non-verbal per baseline. Moves all extremities spontaneously on stretcher and will lift all extremities up off stretcher spontaneously without apparent gross focal motor deficits in extremities. Pt appears to follow commands when asked to place arms down to obtain EKG..; Skin: Color normal, Warm, Dry.   ED Treatments / Results  Labs (all labs ordered are listed, but only abnormal results are displayed)   EKG None  Radiology   Procedures Procedures (including critical care time)  Medications Ordered in ED Medications - No data to display   Initial Impression / Assessment and Plan / ED Course  I have reviewed the triage vital signs and the nursing notes.  Pertinent labs & imaging results that were available during my care of the patient were reviewed by me and considered in my medical decision making (see chart for details).     MDM Reviewed: previous chart, nursing note and vitals Reviewed previous: labs and ECG Interpretation: labs, ECG, x-ray and CT scan Total time providing critical care: 30-74 minutes. This excludes time spent performing separately reportable procedures and services. Consults: admitting MD    CRITICAL CARE Performed by:  Francine Graven Total critical care time: 35 minutes Critical care time was exclusive of separately billable procedures and treating other patients. Critical care was necessary  to treat or prevent imminent or life-threatening deterioration. Critical care was time spent personally by me on the following activities: development of treatment plan with patient and/or surrogate as well as nursing, discussions with consultants, evaluation of patient's response to treatment, examination of patient, obtaining history from patient or surrogate, ordering and performing treatments and interventions, ordering and review of laboratory studies, ordering and review of radiographic studies, pulse oximetry and re-evaluation of patient's condition.   ED ECG REPORT  Date: 04/21/2019  Rate: 86  Rhythm: normal sinus rhythm  QRS Axis: left  Intervals: QT prolonged  ST/T Wave abnormalities: nonspecific ST/T changes  Conduction Disutrbances:nonspecific intraventricular conduction delay  Narrative Interpretation: significant artifact  Old EKG Reviewed: changed; QT has lengthened NS NSTW changes appear similar compared to previous EKG dated 04/14/19 and 02/16/19. I have personally reviewed the EKG tracing and agree with the computerized printout as noted.   Results for orders placed or performed during the hospital encounter of 04/21/19  Culture, blood (routine x 2)  Result Value Ref Range   Specimen Description BLOOD LEFT WRIST    Special Requests      BOTTLES DRAWN AEROBIC ONLY Blood Culture results may not be optimal due to an inadequate volume of blood received in culture bottles Performed at [facility], [address], [place], [place] [address]    Culture PENDING    Report Status PENDING   Culture, blood (routine x 2)  Result Value Ref Range   Specimen Description LEFT ANTECUBITAL    Special Requests      BOTTLES DRAWN AEROBIC AND ANAEROBIC Blood Culture adequate volume Performed at [facility], [address] [place], [place] [address]    Culture PENDING    Report Status PENDING   Comprehensive metabolic panel  Result Value Ref Range   Sodium 129 (L) 135 - 145 mmol/L   Potassium 3.4 (L) 3.5 - 5.1 mmol/L   Chloride 89 (L) 98 - 111 mmol/L   CO2 23 22 - 32 mmol/L   Glucose, Bld 101 (H) 70 - 99 mg/dL   BUN 21 8 - 23 mg/dL   Creatinine, Ser 0.90 0.61 - 1.24 mg/dL   Calcium 10.0 8.9 - 10.3 mg/dL   Total Protein 8.2 (H) 6.5 - 8.1 g/dL   Albumin 4.2 3.5 - 5.0 g/dL   AST 35 15 - 41 U/L   ALT 40 0 - 44 U/L   Alkaline Phosphatase 100 38 - 126 U/L   Total Bilirubin 0.4 0.3 - 1.2 mg/dL   GFR calc non Af Amer >60 >60 mL/min   GFR calc Af Amer >60 >60 mL/min   Anion gap 17 (H) 5 - 15  Troponin I - Once  Result Value Ref Range   Troponin I <0.03 <0.03 ng/mL  Lactic acid, plasma  Result Value Ref Range   Lactic Acid, Venous 3.7 (HH) 0.5 - 1.9 mmol/L  Lactic acid, plasma  Result Value Ref Range   Lactic Acid, Venous 3.4 (HH) 0.5 - 1.9 mmol/L  CBC with Differential  Result Value Ref Range   WBC 8.9 4.0 - 10.5 K/uL   RBC 5.40 4.22 - 5.81 MIL/uL   Hemoglobin 14.0 13.0 - 17.0 g/dL   HCT 45.1 39.0 - 52.0 %   MCV 83.5 80.0 - 100.0 fL   MCH 25.9 (L) 26.0 - 34.0 pg   MCHC 31.0 30.0 - 36.0 g/dL   RDW 14.5 11.5 - 15.5 %   Platelets 374 150 - 400 K/uL  nRBC 0.0 0.0 - 0.2 %   Neutrophils Relative % 83 %   Neutro Abs 7.3 1.7 - 7.7 K/uL   Lymphocytes Relative 9 %   Lymphs Abs 0.8 0.7 - 4.0 K/uL   Monocytes Relative 8 %   Monocytes Absolute 0.7 0.1 - 1.0 K/uL   Eosinophils Relative 0 %   Eosinophils Absolute 0.0 0.0 - 0.5 K/uL   Basophils Relative 0 %   Basophils Absolute 0.0 0.0 - 0.1 K/uL   Immature Granulocytes 0 %   Abs Immature Granulocytes 0.04 0.00 - 0.07 K/uL  Protime-INR  Result Value Ref Range   Prothrombin Time 13.5 11.4 - 15.2 seconds   INR 1.0 0.8 - 1.2  Urinalysis, Routine w reflex microscopic  Result Value Ref Range   Color, Urine STRAW (A) YELLOW    APPearance CLEAR CLEAR   Specific Gravity, Urine 1.006 1.005 - 1.030   pH 8.0 5.0 - 8.0   Glucose, UA NEGATIVE NEGATIVE mg/dL   Hgb urine dipstick MODERATE (A) NEGATIVE   Bilirubin Urine NEGATIVE NEGATIVE   Ketones, ur 5 (A) NEGATIVE mg/dL   Protein, ur NEGATIVE NEGATIVE mg/dL   Nitrite NEGATIVE NEGATIVE   Leukocytes,Ua NEGATIVE NEGATIVE   RBC / HPF >50 (H) 0 - 5 RBC/hpf   WBC, UA 0-5 0 - 5 WBC/hpf   Bacteria, UA NONE SEEN NONE SEEN  Urine rapid drug screen (hosp performed)  Result Value Ref Range   Opiates POSITIVE (A) NONE DETECTED   Cocaine NONE DETECTED NONE DETECTED   Benzodiazepines NONE DETECTED NONE DETECTED   Amphetamines NONE DETECTED NONE DETECTED   Tetrahydrocannabinol NONE DETECTED NONE DETECTED   Barbiturates NONE DETECTED NONE DETECTED  Acetaminophen level  Result Value Ref Range   Acetaminophen (Tylenol), Serum <10 (L) 10 - 30 ug/mL  Salicylate level  Result Value Ref Range   Salicylate Lvl <7.6 2.8 - 30.0 mg/dL  Ethanol  Result Value Ref Range   Alcohol, Ethyl (B) <10 <10 mg/dL  CBG monitoring, ED  Result Value Ref Range   Glucose-Capillary 89 70 - 99 mg/dL   Ct Head Wo Contrast Result Date: 04/21/2019 CLINICAL DATA:  Encephalopathy EXAM: CT HEAD WITHOUT CONTRAST TECHNIQUE: Contiguous axial images were obtained from the base of the skull through the vertex without intravenous contrast. COMPARISON:  04/14/2019 FINDINGS: Brain: No acute intracranial abnormality. Specifically, no hemorrhage, hydrocephalus, mass lesion, acute infarction, or significant intracranial injury. Calcification in the inferior right cerebellum, stable Vascular: No hyperdense vessel or unexpected calcification. Skull: No acute calvarial abnormality. Sinuses/Orbits: Mucosal thickening throughout the paranasal sinuses. Debris noted in the nasopharynx, stable since prior study. Other: None IMPRESSION: No acute intracranial abnormality. Electronically Signed   By: Rolm Baptise M.D.   On: 04/21/2019  21:46    Dg Chest Port 1 View Result Date: 04/21/2019 CLINICAL DATA:  Syncope. EXAM: PORTABLE CHEST 1 VIEW COMPARISON:  04/14/2019 FINDINGS: The heart size and mediastinal contours are within normal limits. Stable emphysematous lung disease. The lungs are hyperinflated bilaterally. There is no evidence of pulmonary edema, consolidation, pneumothorax, nodule or pleural fluid. The visualized skeletal structures are unremarkable. IMPRESSION: Emphysema and hyperinflation.  No acute findings. Electronically Signed   By: Aletta Edouard M.D.   On: 04/21/2019 16:37    2200:  Bladder scan with 500+ml urine, unable to pass coud catheter; pt did however void on his own. Bair hugger applied on pt's arrival with improvement in temperature. Pt has remained awake/alert during ED visit, gesturing to staff in attempts to  communicate. No vomiting or stooling while in the ED. IVF NS 30mg /kg given for elevated lactic acid with slow improvement. No clear infectious cause for same; question dehydration, malnutrition.  T/C returned from [facility]. Olevia Bowens, case discussed, including:  HPI, pertinent PM/SHx, VS/PE, dx testing, ED course and treatment:  Agreeable to admit.        Final Clinical Impressions(s) / ED Diagnoses   Final diagnoses:  None    ED Discharge Orders    None       Francine Graven, DO 04/25/19 1850

## 2019-04-21 NOTE — ED Notes (Signed)
Pt returned from CT °

## 2019-04-21 NOTE — ED Notes (Signed)
Date and time results received: 04/21/19 6:21 PM  (use smartphrase ".now" to insert current time)  Test: lactic Critical Value: 3.4  Name of Provider Notified: Macmanus  Orders Received? Or Actions Taken?: None at this time.

## 2019-04-21 NOTE — ED Notes (Signed)
Date and time results received: 04/21/19 1730 (use smartphrase ".now" to insert current time)  Test: lac acid Critical Value: 7.7 Name of Provider Notified: mcmannus  Orders Received? Or Actions Taken?: see chart

## 2019-04-21 NOTE — ED Notes (Signed)
Unable to pass In and out cath or coude cath to obtain urine. Dr Thurnell Garbe aware. Condom cath placed on pt

## 2019-04-21 NOTE — ED Notes (Signed)
Pt suctioned of large amount of oral and nasal secretions.

## 2019-04-21 NOTE — ED Triage Notes (Addendum)
Pt  at home with wife. Called EMS die to pt stiffening up and slid out of recliner. Initially pt couldn't stand, but then was able to stand. Noted to be shaky. EMS states back to baseline at this time. Unable to speak, holding legs in air.Extreme Difficulty understanding pt. EMS reports they have made several call out to home to help up. Pt wanted to get back in chair by pointing to it but noted to be shaky go he was transported here

## 2019-04-22 ENCOUNTER — Encounter (HOSPITAL_COMMUNITY): Payer: Self-pay | Admitting: *Deleted

## 2019-04-22 ENCOUNTER — Ambulatory Visit (HOSPITAL_COMMUNITY): Payer: Medicare HMO | Admitting: Hematology

## 2019-04-22 ENCOUNTER — Inpatient Hospital Stay (HOSPITAL_COMMUNITY)
Admit: 2019-04-22 | Discharge: 2019-04-22 | Disposition: A | Discharge status: Home / Self Care | Payer: Medicare HMO | Attending: Family Medicine | Admitting: Family Medicine

## 2019-04-22 ENCOUNTER — Other Ambulatory Visit: Payer: Self-pay

## 2019-04-22 DIAGNOSIS — E785 Hyperlipidemia, unspecified: Secondary | ICD-10-CM | POA: Diagnosis present

## 2019-04-22 DIAGNOSIS — Z79890 Hormone replacement therapy (postmenopausal): Secondary | ICD-10-CM | POA: Diagnosis not present

## 2019-04-22 DIAGNOSIS — C770 Secondary and unspecified malignant neoplasm of lymph nodes of head, face and neck: Secondary | ICD-10-CM | POA: Diagnosis present

## 2019-04-22 DIAGNOSIS — E162 Hypoglycemia, unspecified: Secondary | ICD-10-CM | POA: Diagnosis present

## 2019-04-22 DIAGNOSIS — R64 Cachexia: Secondary | ICD-10-CM | POA: Diagnosis present

## 2019-04-22 DIAGNOSIS — R7989 Other specified abnormal findings of blood chemistry: Secondary | ICD-10-CM | POA: Diagnosis present

## 2019-04-22 DIAGNOSIS — G9341 Metabolic encephalopathy: Secondary | ICD-10-CM | POA: Diagnosis present

## 2019-04-22 DIAGNOSIS — Z7189 Other specified counseling: Secondary | ICD-10-CM | POA: Diagnosis not present

## 2019-04-22 DIAGNOSIS — C329 Malignant neoplasm of larynx, unspecified: Secondary | ICD-10-CM | POA: Diagnosis present

## 2019-04-22 DIAGNOSIS — R4182 Altered mental status, unspecified: Secondary | ICD-10-CM | POA: Diagnosis not present

## 2019-04-22 DIAGNOSIS — E871 Hypo-osmolality and hyponatremia: Secondary | ICD-10-CM | POA: Diagnosis present

## 2019-04-22 DIAGNOSIS — H5461 Unqualified visual loss, right eye, normal vision left eye: Secondary | ICD-10-CM | POA: Diagnosis present

## 2019-04-22 DIAGNOSIS — I10 Essential (primary) hypertension: Secondary | ICD-10-CM

## 2019-04-22 DIAGNOSIS — I959 Hypotension, unspecified: Secondary | ICD-10-CM | POA: Diagnosis not present

## 2019-04-22 DIAGNOSIS — E039 Hypothyroidism, unspecified: Secondary | ICD-10-CM

## 2019-04-22 DIAGNOSIS — E872 Acidosis: Secondary | ICD-10-CM | POA: Diagnosis present

## 2019-04-22 DIAGNOSIS — Z681 Body mass index (BMI) 19 or less, adult: Secondary | ICD-10-CM | POA: Diagnosis not present

## 2019-04-22 DIAGNOSIS — R1310 Dysphagia, unspecified: Secondary | ICD-10-CM | POA: Diagnosis present

## 2019-04-22 DIAGNOSIS — G9340 Encephalopathy, unspecified: Secondary | ICD-10-CM | POA: Diagnosis not present

## 2019-04-22 DIAGNOSIS — E890 Postprocedural hypothyroidism: Secondary | ICD-10-CM | POA: Diagnosis present

## 2019-04-22 DIAGNOSIS — M7989 Other specified soft tissue disorders: Secondary | ICD-10-CM | POA: Diagnosis not present

## 2019-04-22 DIAGNOSIS — M1990 Unspecified osteoarthritis, unspecified site: Secondary | ICD-10-CM | POA: Diagnosis present

## 2019-04-22 DIAGNOSIS — R680 Hypothermia, not associated with low environmental temperature: Secondary | ICD-10-CM | POA: Diagnosis present

## 2019-04-22 DIAGNOSIS — E43 Unspecified severe protein-calorie malnutrition: Secondary | ICD-10-CM | POA: Diagnosis present

## 2019-04-22 DIAGNOSIS — E860 Dehydration: Secondary | ICD-10-CM | POA: Diagnosis present

## 2019-04-22 DIAGNOSIS — R404 Transient alteration of awareness: Secondary | ICD-10-CM | POA: Diagnosis present

## 2019-04-22 DIAGNOSIS — Z931 Gastrostomy status: Secondary | ICD-10-CM | POA: Diagnosis not present

## 2019-04-22 DIAGNOSIS — Z79899 Other long term (current) drug therapy: Secondary | ICD-10-CM | POA: Diagnosis not present

## 2019-04-22 DIAGNOSIS — E46 Unspecified protein-calorie malnutrition: Secondary | ICD-10-CM | POA: Diagnosis present

## 2019-04-22 DIAGNOSIS — Z66 Do not resuscitate: Secondary | ICD-10-CM | POA: Diagnosis present

## 2019-04-22 LAB — TSH: TSH: 32.961 u[IU]/mL — ABNORMAL HIGH (ref 0.350–4.500)

## 2019-04-22 LAB — CBC WITH DIFFERENTIAL/PLATELET
Abs Immature Granulocytes: 0.01 10*3/uL (ref 0.00–0.07)
Basophils Absolute: 0 10*3/uL (ref 0.0–0.1)
Basophils Relative: 0 %
Eosinophils Absolute: 0 10*3/uL (ref 0.0–0.5)
Eosinophils Relative: 1 %
HCT: 39.7 % (ref 39.0–52.0)
Hemoglobin: 12.5 g/dL — ABNORMAL LOW (ref 13.0–17.0)
Immature Granulocytes: 0 %
Lymphocytes Relative: 13 %
Lymphs Abs: 0.7 10*3/uL (ref 0.7–4.0)
MCH: 26.1 pg (ref 26.0–34.0)
MCHC: 31.5 g/dL (ref 30.0–36.0)
MCV: 82.9 fL (ref 80.0–100.0)
Monocytes Absolute: 0.9 10*3/uL (ref 0.1–1.0)
Monocytes Relative: 15 %
Neutro Abs: 4.2 10*3/uL (ref 1.7–7.7)
Neutrophils Relative %: 71 %
Platelets: 312 10*3/uL (ref 150–400)
RBC: 4.79 MIL/uL (ref 4.22–5.81)
RDW: 14.3 % (ref 11.5–15.5)
WBC: 5.8 10*3/uL (ref 4.0–10.5)
nRBC: 0 % (ref 0.0–0.2)

## 2019-04-22 LAB — COMPREHENSIVE METABOLIC PANEL
ALT: 31 U/L (ref 0–44)
AST: 29 U/L (ref 15–41)
Albumin: 3.5 g/dL (ref 3.5–5.0)
Alkaline Phosphatase: 81 U/L (ref 38–126)
Anion gap: 8 (ref 5–15)
BUN: 14 mg/dL (ref 8–23)
CO2: 26 mmol/L (ref 22–32)
Calcium: 9.2 mg/dL (ref 8.9–10.3)
Chloride: 101 mmol/L (ref 98–111)
Creatinine, Ser: 0.6 mg/dL — ABNORMAL LOW (ref 0.61–1.24)
GFR calc Af Amer: >60 mL/min (ref >60)
GFR calc non Af Amer: >60 mL/min (ref >60)
Glucose, Bld: 69 mg/dL — ABNORMAL LOW (ref 70–99)
Potassium: 4.3 mmol/L (ref 3.5–5.1)
Sodium: 135 mmol/L (ref 135–145)
Total Bilirubin: 0.5 mg/dL (ref 0.3–1.2)
Total Protein: 6.9 g/dL (ref 6.5–8.1)

## 2019-04-22 LAB — GLUCOSE, CAPILLARY
Glucose-Capillary: 62 mg/dL — ABNORMAL LOW (ref 70–99)
Glucose-Capillary: 66 mg/dL — ABNORMAL LOW (ref 70–99)
Glucose-Capillary: 53 mg/dL — ABNORMAL LOW (ref 70–99)
Glucose-Capillary: 232 mg/dL — ABNORMAL HIGH (ref 70–99)

## 2019-04-22 LAB — CORTISOL-AM, BLOOD: Cortisol - AM: 21.4 ug/dL (ref 6.7–22.6)

## 2019-04-22 LAB — LACTIC ACID, PLASMA: Lactic Acid, Venous: 1.8 mmol/L (ref 0.5–1.9)

## 2019-04-22 LAB — T4, FREE: Free T4: 1.16 ng/dL (ref 0.82–1.77)

## 2019-04-22 MED ORDER — OSMOLITE 1.5 CAL PO LIQD
237.0000 mL | Freq: Every day | ORAL | Status: DC
  Administered 2019-04-22 – 2019-04-26 (×20): 237 mL
  Filled 2019-04-22 (×27): qty 1000

## 2019-04-22 MED ORDER — PROCHLORPERAZINE MALEATE 5 MG PO TABS: 5.0000 mg | ORAL_TABLET | Freq: Four times a day (QID) | ORAL | Status: DC | PRN

## 2019-04-22 MED ORDER — DEXTROSE 50 % IV SOLN
INTRAVENOUS | Status: AC
  Administered 2019-04-22: 25 mL
  Filled 2019-04-22: qty 50

## 2019-04-22 MED ORDER — JEVITY 1.2 CAL PO LIQD
237.0000 mL | Freq: Four times a day (QID) | ORAL | Status: DC
  Administered 2019-04-22 (×2): 237 mL
  Filled 2019-04-22 (×14): qty 237

## 2019-04-22 MED ORDER — PRO-STAT SUGAR FREE PO LIQD
30.0000 mL | Freq: Every day | ORAL | Status: DC
  Administered 2019-04-22 – 2019-04-26 (×5): 30 mL
  Filled 2019-04-22 (×6): qty 30

## 2019-04-22 MED ORDER — LEVOTHYROXINE SODIUM 100 MCG/5ML IV SOLN
50.0000 ug | Freq: Every day | INTRAVENOUS | Status: DC
  Administered 2019-04-22 – 2019-04-23 (×2): 50 ug via INTRAVENOUS
  Filled 2019-04-22 (×2): qty 5

## 2019-04-22 MED ORDER — DEXTROSE 50 % IV SOLN
INTRAVENOUS | Status: AC
  Administered 2019-04-22: 18:00:00 50 mL
  Filled 2019-04-22: qty 50

## 2019-04-22 MED ORDER — HYDROCODONE-ACETAMINOPHEN 7.5-325 MG/15ML PO SOLN
10.0000 mL | Freq: Four times a day (QID) | ORAL | Status: DC | PRN
  Administered 2019-04-25: 23:00:00 10 mL
  Filled 2019-04-22: qty 15

## 2019-04-22 MED ORDER — DEXTROSE 50 % IV SOLN
INTRAVENOUS | Status: AC
  Administered 2019-04-22: 08:00:00 25 mL
  Filled 2019-04-22: qty 50

## 2019-04-22 NOTE — Progress Notes (Signed)
Blood glucose of 53 at 1817.  Gave IV dextrose and contacted Dr Wynetta Emery,  At  [phone] was 232

## 2019-04-22 NOTE — Procedures (Signed)
ELECTROENCEPHALOGRAM REPORT   Patient: Brandon Hall       Room #: I338 EEG No. ID: [id number] Age: 63 y.o.        Sex: male Referring Physician: Wynetta Emery Report Date:  04/22/2019        Interpreting Physician: Alexis Goodell  History: Brandon Hall is an 62 y.o. male with seizure-like activity  Medications:  Synthroid, Pravachol  Conditions of Recording:  This is a 21 channel routine scalp EEG performed with bipolar and monopolar montages arranged in accordance to the international 10/20 system of electrode placement. One channel was dedicated to EKG recording.  The patient is in the awake, drowsy and asleep states.  Description:  The waking background activity consists of a low voltage, symmetrical, fairly well organized, 8 Hz alpha activity, seen from the parieto-occipital and posterior temporal regions.  Low voltage fast activity, poorly organized, is seen anteriorly and is at times superimposed on more posterior regions.  A mixture of theta and alpha rhythms are seen from the central and temporal regions. The patient drowses with slowing to irregular, low voltage theta and beta activity.   The patient goes in to a light sleep with symmetrical sleep spindles, vertex central sharp transients and irregular slow activity.  No epileptiform activity is noted.   Hyperventilation and intermittent photic stimulation were not performed.  IMPRESSION: Normal electroencephalogram, awake and asleep. There are no focal lateralizing or epileptiform features.   Alexis Goodell, MD Neurology [phone] 04/22/2019, 2:57 PM

## 2019-04-22 NOTE — Progress Notes (Signed)
EEG complete - results pending 

## 2019-04-22 NOTE — TOC Initial Note (Signed)
Transition of Care [facility]) - Initial/Assessment Note    Patient Details  Name: Brandon Hall MRN: [id number] Date of Birth: May 04, 1956  Transition of Care [facility]) CM/SW Contact:    Annice Needy, LCSW Phone Number: 04/22/2019, 4:54 PM  Clinical Narrative:                 From home with spouse. Patient is active with [facility] with RN. He was discharged from [facility] and PT. Call placed to wife; no answer received, no machine came on to leave message.  LCSW will follow patient through discharge and address discharge needs.   Expected Discharge Plan: Home w Home Health Services Barriers to Discharge: No Barriers Identified   Patient Goals and CMS Choice        Expected Discharge Plan and Services Expected Discharge Plan: Home w Home Health Services       Living arrangements for the past 2 months: Apartment Expected Discharge Date: 04/23/19                                    Prior Living Arrangements/Services Living arrangements for the past 2 months: Apartment Lives with:: Spouse Patient language and need for interpreter reviewed:: No        Need for Family Participation in Patient Care: Yes (Comment) Care giver support system in place?: Yes (comment) Current home services: DME, Home RN Criminal Activity/Legal Involvement Pertinent to Current Situation/Hospitalization: No - Comment as needed  Activities of Daily Living Home Assistive Devices/Equipment: None ADL Screening (condition at time of admission) Patient's cognitive ability adequate to safely complete daily activities?: No Is the patient deaf or have difficulty hearing?: No Does the patient have difficulty seeing, even when wearing glasses/contacts?: Yes Does the patient have difficulty concentrating, remembering, or making decisions?: Yes Patient able to express need for assistance with ADLs?: No Does the patient have difficulty dressing or bathing?: Yes Independently performs ADLs?: No Communication:  Dependent Is this a change from baseline?: Pre-admission baseline Dressing (OT): Needs assistance Is this a change from baseline?: Pre-admission baseline Grooming: Needs assistance Is this a change from baseline?: Pre-admission baseline Feeding: Independent Is this a change from baseline?: Pre-admission baseline Bathing: Needs assistance Is this a change from baseline?: Pre-admission baseline Toileting: Needs assistance Is this a change from baseline?: Pre-admission baseline In/Out Bed: Needs assistance Is this a change from baseline?: Pre-admission baseline Walks in Home: Needs assistance Is this a change from baseline?: Pre-admission baseline Does the patient have difficulty walking or climbing stairs?: Yes Weakness of Legs: Both Weakness of Arms/Hands: None  Permission Sought/Granted                  Emotional Assessment Appearance:: Appears stated age   Affect (typically observed): Unable to Assess Orientation: : Oriented to Self, Oriented to Place, Oriented to  Time, Oriented to Situation Alcohol / Substance Use: Not Applicable Psych Involvement: No (comment)  Admission diagnosis:  Transient alteration of awareness [R40.4] Lactic acidosis [E87.2] Hyponatremia [E87.1] Hypertrophy of prostate [N40.0] Patient Active Problem List   Diagnosis Date Noted  . Altered mental state 04/22/2019  . Hypoglycemia 04/22/2019  . Elevated TSH 04/22/2019  . Hyponatremia   . Lactic acidosis   . Vomiting in adult   . Dehydration   . Aspiration pneumonia of right middle lobe due to vomit (HCC)   . Tachycardia 04/14/2019  . Goals of care, counseling/discussion 01/31/2019  . DNR (  do not resuscitate) 01/11/2019  . Cachexia (HCC) 01/11/2019  . Lung nodule 01/11/2019  . Status post laryngectomy 09/06/2018  . Severe protein-calorie malnutrition (HCC) 08/20/2018  . Laryngeal cancer (HCC) 08/19/2018  . Perianal abscess 08/10/2017  . Vitamin D deficiency 02/08/2015  . Squamous cell  carcinoma of head and neck (HCC) 07/12/2014  . History of prostate cancer 07/12/2014  . Shingles rash 02/03/2014  . Abnormal transaminases 10/02/2013  . Uncontrolled Hypothyroidism 10/02/2013  . Blind right eye 09/30/2013  . Dyslipidemia 09/30/2013  . Hypertension   . Graves disease    PCP:  Dettinger, Elige Radon, MD Pharmacy:   [facility] - [place] - [address] [place] [address] Phone: [phone] Fax: [phone]  [facility] - [place], [place] - [address] [place] [address] Phone: [phone] Fax: [phone]     Social Determinants of Health (SDOH) Interventions    Readmission Risk Interventions Readmission Risk Prevention Plan 04/15/2019  Transportation Screening Complete  PCP or Specialist Appt within 3-5 Days Complete  HRI or Home Care Consult Complete  Social Work Consult for Recovery Care Planning/Counseling Complete  Palliative Care Screening Not Applicable  Medication Review Oceanographer) Complete  Some recent data might be hidden

## 2019-04-22 NOTE — Progress Notes (Signed)
CSW contacted Pt's wife Merwin Breden ph: [phone] to discuss discharge plans. CSW engaged Pt's wife in conversation regarding how receptive she is to home health.   Pt's wife, Stanton Kidney, reports that prior to pt being admitted into hospital, arrangments had already been made for a nurse to follow pt at home. Pt's wife goes into detail and states that when Pt was later admitted into [facility], the nurse explained that she would be back to resume service once he was discharged from the hospital.    Pt's wife expressed confusion about what Home health care throughout the conversation despite the education provided by CSW. Pt's wife asked CSW to call Kerin Salen, to relay information to her instead.   CSW obtained verbal permission to contact Kerin Salen.  Pt's wife states that her and her husband could benefit from additional assistance at home. Pt's wife expressed interest in extra help with household needs such as cooking 2-3x weekly but is not willing to pay privately.   CSW later attempted to [place] ph: [phone]. CSW left voicemail for her to return call to discuss needs at home.

## 2019-04-22 NOTE — Progress Notes (Addendum)
Initial Nutrition Assessment  DOCUMENTATION CODES:  Severe malnutrition in context of chronic illness, Underweight  INTERVENTION:  D/C jevity 1.2   Initiate Osmolite 1.5, 240 cc (~1 can) 5x daily. Flush w/ 30 cc before and after. Add 30 ml prostat Q 24 hrs  This regimen will provide 1900 kcals, 90g Pro and 914 mls fluid (+300 cc from flushes)  Continue additional free water bolus of 300 cc Q8 hrs  NUTRITION DIAGNOSIS:  Inadequate oral intake related to inability to eat as evidenced by NPO status.  GOAL:  Patient will meet greater than or equal to 90% of their needs  MONITOR:  Diet advancement, TF tolerance, Weight trends, Labs, I & O's  REASON FOR ASSESSMENT:  Malnutrition Screening Tool Home Tube Feeder  ASSESSMENT:  63 y/o Graves Disease, HTN,  prostate cancer and laryngeal carcinoma, originally dx 2015. He received chemoradiation at that time. Recurred in mid 2019-s/p total laryngectomy 9/9. Repeat CT this last December showed recurrence. Enrolled in hospice, but revoked it when he was deemed candidate for immunotherapy. Presents to hospital w/ worsening jaw pain, cough and vomiting. Had elevated lactic acid in ED. Admitted for workup.   Pt known to RD from the [facility]. At the time of pt revoking hospice in early February, he had been off of TF for many months, but his PEG had never been removed. His TF was then restarted in February d/t weight loss and worsening appetite associated with his cancer recurrence. He has been receiving PEG feeds with Osmolite 1.5 since that time. Per last dietitian note (3/27), pt has been on a regimen of 4 cartons of Osmolite 1.5 qd w/ 60 cc flush before and after. This would provide the pt 1420 kcals,  60g Pro and 724 mls fluid (+480 from flushes)  Today, pt seems a little more erratic, slapping his legs and bed and repeatedly giving thumbs up. He communicates that he DOES still take oral intake by mouth, but only a tiny amount. He  is still infusing 4 cans of TF today and has not had any issues with tolerance. He is spitting secretions into a bucket, but he apparently has been doing this at baseline and this is unrelated to his TF boluses.   Pt endorses weight gain. His current listed wt appears to have been estimated/reported. Took a bed weight and it was 113.8 lbs. This is roughly the same weight patient was when he was restarted on tube feeding back in Feb. He was 109.8 lbs when he was weighed outpatient a couple days ago.    Pts oral intake does not sound to be as good as it once was. He no longer sounds to be getting much nutrition orally. He was actually noted as being on Jevity 1.5 in H&P? Unclear where this originated.There is no date to go along with that order. Feel Osmolite 1.5 is more appropriate. RD reached out to MD and got V.O. to adjust inpatient TF regimen. TF changes discussed with nurse.   Labs: Hypoglycemic. Bgs in 36s. Lactic 3.4->1.8 Meds: Jev 1.2, Nystatin. IVF, KCL.   Recent Labs  Lab 04/21/19 1621 04/21/19 2225 04/22/19 0452  NA 129* 133* 135  K 3.4* 3.6 4.3  CL 89* 95* 101  CO2 23 25 26   BUN 21 16 14   CREATININE 0.90 0.75 0.60*  CALCIUM 10.0 9.1 9.2  MG  --  2.3  --   PHOS  --  3.6  --   GLUCOSE 101* 101* 69*  NUTRITION - FOCUSED PHYSICAL EXAM:   Most Recent Value  Orbital Region  Mild depletion  Upper Arm Region  Severe depletion  Thoracic and Lumbar Region  Severe depletion  Buccal Region  Moderate depletion  Temple Region  Moderate depletion  Clavicle Bone Region  Severe depletion  Clavicle and Acromion Bone Region  Severe depletion  Scapular Bone Region  Severe depletion  Dorsal Hand  Moderate depletion  Patellar Region  Severe depletion  Anterior Thigh Region  Severe depletion  Posterior Calf Region  Severe depletion  Edema (RD Assessment)  None  Hair  Reviewed  Eyes  Reviewed  Mouth  Reviewed  Skin  Reviewed  Nails  Reviewed     Diet Order:   Diet Order             Diet NPO time specified  Diet effective now             EDUCATION NEEDS:  No education needs have been identified at this time  Skin: PU stage II (healed/scabbed)  sacrum  Last BM:  4/23  Height:  Ht Readings from Last 1 Encounters:  04/22/19 6\' 1"  (1.854 m)   Weight:  Wt Readings from Last 1 Encounters:  04/22/19 51.4 kg   Wt Readings from Last 10 Encounters:  04/22/19 51.4 kg  04/20/19 49.8 kg  04/14/19 52.4 kg  03/27/19 54.4 kg  03/25/19 54.4 kg  03/04/19 53.5 kg  02/16/19 72.6 kg  02/13/19 53.5 kg  02/08/19 52.2 kg  01/31/19 52 kg   Ideal Body Weight:  83.64 kg  BMI:  Body mass index is 14.96 kg/m.  Estimated Nutritional Needs:  Kcal:  1800-2050 kcals (35-40 kcal/kg bw) Protein:  93-103g pro (1.8-2g/kg bw) Fluid:  >1.6 L fluid (30 ml/kg bw)  Burtis Junes RD, LDN, CNSC Clinical Nutrition Available Tues-Sat via Pager: [phone] 04/22/2019 3:23 PM

## 2019-04-22 NOTE — Progress Notes (Signed)
2nd shift ED CSW received a call from [facility]: [phone].   Brayton Layman reported that prior to being admitted into the hospital, pt was being followed by [facility]. [name] went into detail and explained that Pt does not have any DME in the home and could benefit from a wheelchair and bedside commode. Pt has difficulty transitioning from sit to stand while being transported.    Brayton Layman states that communication can be a barrier with Pt's wife and that she often assists the wife in understanding updates from medical providers.   CSW will continue to follow up regarding this matter.   T[facility] Transitions of Care  Clinical Social Worker  Ph: [phone]

## 2019-04-22 NOTE — Progress Notes (Addendum)
PROGRESS NOTE  Brandon Hall  UVO:[id number]  DOB: 05/14/1956  DOA: 04/21/2019 PCP: Dettinger, Elige Radon, MD   Brief Admission Hx: 63 year old gentleman with osteoarthritis, pharyngeal cancer being treated with chemotherapy, with history of radiation to the neck, postradiation hypothyroidism, herpes zoster, hypertension, prostate cancer was admitted with transient altered mental status, dehydration, hypothermia.  MDM/Assessment & Plan:   1. Metabolic encephalopathy- resolved, patient seems to be back to his baseline however he cannot recall any history of epilepsy or prior seizures.  An EEG has been ordered.  His CT brain did not show any signs of metastatic disease.  He has a history of being overmedicated with pain medications which has in the past caused these symptoms.  Continue current treatments. 2. Lactic acidosis- suspect secondary to mild dehydration versus possible seizure activity.  He has responded very well to fluid hydration.  He has no signs or symptoms of infection that we have found.  He recently completed a course of Augmentin for aspiration pneumonia. 3. Hypothyroidism-his elevated TSH is concerning that he is not absorbing the levothyroxine through his J-tube.  His TSH is greater than 30.  I have started IV levothyroxine replacement.  Check free T4 and T3. 4. Hypoglycemia- check cortisol level as a screen for adrenal insufficiency.  Monitor CBG with serial testing.  He may need to be started on prednisone to prevent hypoglycemia. 5. Hypothermia - RESOLVED. 6. Dyslipidemia he is being treated with pravastatin via PEG tube. 7. Essential hypertension-he has not required antihypertensives.  DVT prophylaxis: Lovenox Code Status: Full Family Communication: unable to contact spouse or niece (no answer phone multiple attempts) Disposition Plan: Inpatient  Consultants:    Procedures:    Antimicrobials:     Subjective: Communication with the patient is difficult but he is  able to nod yes no and write.  He says that he is being well cared for at home but he does not want to go home at this time.  He says he has no history of epilepsy or prior seizure activity.  He says that he has no pain or discomfort at this time.  He says that he is being given his medications.  I called several times and unable to speak with his spouse or niece.   Objective: Vitals:   04/21/19 2330 04/21/19 2332 04/22/19 0100 04/22/19 0544  BP:  102/76  (!) 151/78  Pulse: 94 87  89  Resp: 18   17  Temp:    98.3 F (36.8 C)  TempSrc:    Oral  SpO2: 97% 99%  97%  Weight:   54 kg   Height:   6\' 1"  (1.854 m)     Intake/Output Summary (Last 24 hours) at 04/22/2019 1244 Last data filed at 04/22/2019 0800 Gross per 24 hour  Intake 1553.33 ml  Output 894 ml  Net 659.33 ml   Filed Weights   04/22/19 0100  Weight: 54 kg     REVIEW OF SYSTEMS  As per history otherwise all reviewed and reported negative  Exam:  General exam: Emaciated chronically ill-appearing male he is a phasic and unable to speak however he is alert and oriented x3 and cooperative.  He has a lot of oral secretions. Neck: tracheostomy in place.  Respiratory system: Clear. No increased work of breathing. Cardiovascular system: S1 & S2 heard. No JVD, murmurs, gallops, clicks or pedal edema. Gastrointestinal system: PEG in place. Abdomen is nondistended, soft and nontender. Normal bowel sounds heard. Central nervous system: Alert and oriented.  No focal neurological deficits. Extremities: no CCE.  Data Reviewed: Basic Metabolic Panel: Recent Labs  Lab 04/20/19 1036 04/21/19 1621 04/21/19 2225 04/22/19 0452  NA 132* 129* 133* 135  K 4.0 3.4* 3.6 4.3  CL 90* 89* 95* 101  CO2 25 23 25 26   GLUCOSE 83 101* 101* 69*  BUN 15 21 16 14   CREATININE 0.83 0.90 0.75 0.60*  CALCIUM 10.4* 10.0 9.1 9.2  MG  --   --  2.3  --   PHOS  --   --  3.6  --    Liver Function Tests: Recent Labs  Lab 04/20/19 1036 04/21/19  1621 04/22/19 0452  AST 33 35 29  ALT 44 40 31  ALKPHOS 110 100 81  BILITOT 0.5 0.4 0.5  PROT 7.5 8.2* 6.9  ALBUMIN 4.4 4.2 3.5   No results for input(s): LIPASE, AMYLASE in the last 168 hours. No results for input(s): AMMONIA in the last 168 hours. CBC: Recent Labs  Lab 04/20/19 1036 04/21/19 1621 04/22/19 0452  WBC 4.9 8.9 5.8  NEUTROABS 3.4 7.3 4.2  HGB 13.9 14.0 12.5*  HCT 42.8 45.1 39.7  MCV 79 83.5 82.9  PLT 359 374 312   Cardiac Enzymes: Recent Labs  Lab 04/21/19 1621  TROPONINI <0.03   CBG (last 3)  Recent Labs    04/21/19 1547 04/22/19 0818  GLUCAP 89 62*   Recent Results (from the past 240 hour(s))  Blood Culture (routine x 2)     Status: None   Collection Time: 04/14/19  1:30 PM  Result Value Ref Range Status   Specimen Description BLOOD RIGHT HAND  Final   Special Requests   Final    BOTTLES DRAWN AEROBIC AND ANAEROBIC Blood Culture adequate volume   Culture   Final    NO GROWTH 5 DAYS Performed at [facility], [address], [place], [place] [address]    Report Status 04/19/2019 FINAL  Final  Urine culture     Status: Abnormal   Collection Time: 04/14/19  1:30 PM  Result Value Ref Range Status   Specimen Description   Final    URINE, CLEAN CATCH Performed at [facility], [address], [place], [place] [address]    Special Requests   Final    NONE Performed at [facility], [address], [place], [place] [address]    Culture MULTIPLE SPECIES PRESENT, SUGGEST RECOLLECTION (A)  Final   Report Status 04/16/2019 FINAL  Final  SARS Coronavirus 2 [facility] order, Performed in [facility] hospital lab)     Status: None   Collection Time: 04/14/19  1:30 PM  Result Value Ref Range Status   SARS Coronavirus 2 NEGATIVE NEGATIVE Final    Comment: (NOTE) If result is NEGATIVE SARS-CoV-2 target nucleic acids are NOT DETECTED. The SARS-CoV-2 RNA is generally detectable in upper and lower  respiratory specimens during the acute phase of  infection. The lowest  concentration of SARS-CoV-2 viral copies this assay can detect is 250  copies / mL. A negative result does not preclude SARS-CoV-2 infection  and should not be used as the sole basis for treatment or other  patient management decisions.  A negative result may occur with  improper specimen collection / handling, submission of specimen other  than nasopharyngeal swab, presence of viral mutation(s) within the  areas targeted by this assay, and inadequate number of viral copies  (<250 copies / mL). A negative result must be combined with clinical  observations, patient  history, and epidemiological information. If result is POSITIVE SARS-CoV-2 target nucleic acids are DETECTED. The SARS-CoV-2 RNA is generally detectable in upper and lower  respiratory specimens dur ing the acute phase of infection.  Positive  results are indicative of active infection with SARS-CoV-2.  Clinical  correlation with patient history and other diagnostic information is  necessary to determine patient infection status.  Positive results do  not rule out bacterial infection or co-infection with other viruses. If result is PRESUMPTIVE POSTIVE SARS-CoV-2 nucleic acids MAY BE PRESENT.   A presumptive positive result was obtained on the submitted specimen  and confirmed on repeat testing.  While 2019 novel coronavirus  (SARS-CoV-2) nucleic acids may be present in the submitted sample  additional confirmatory testing may be necessary for epidemiological  and / or clinical management purposes  to differentiate between  SARS-CoV-2 and other Sarbecovirus currently known to infect humans.  If clinically indicated additional testing with an alternate test  methodology [phone]) is advised. The SARS-CoV-2 RNA is generally  detectable in upper and lower respiratory sp ecimens during the acute  phase of infection. The expected result is Negative. Fact Sheet for Patients:   BoilerBrush.com.cy Fact Sheet for Healthcare Providers: https://pope.com/ This test is not yet approved or cleared by the [place] FDA and has been authorized for detection and/or diagnosis of SARS-CoV-2 by FDA under an Emergency Use Authorization (EUA).  This EUA will remain in effect (meaning this test can be used) for the duration of the COVID-19 declaration under Section 564(b)(1) of the Act, 21 U.S.C. section 360bbb-3(b)(1), unless the authorization is terminated or revoked sooner. Performed at [facility], [address], [place], [place] [address]   Blood Culture (routine x 2)     Status: None   Collection Time: 04/14/19  3:49 PM  Result Value Ref Range Status   Specimen Description BLOOD LEFT HAND  Final   Special Requests   Final    BOTTLES DRAWN AEROBIC AND ANAEROBIC Blood Culture results may not be optimal due to an inadequate volume of blood received in culture bottles   Culture   Final    NO GROWTH 5 DAYS Performed at [facility], [address], [place], [place] [address]    Report Status 04/19/2019 FINAL  Final  Culture, blood (routine x 2)     Status: None (Preliminary result)   Collection Time: 04/21/19  4:22 PM  Result Value Ref Range Status   Specimen Description BLOOD LEFT WRIST  Final   Special Requests   Final    BOTTLES DRAWN AEROBIC ONLY Blood Culture results may not be optimal due to an inadequate volume of blood received in culture bottles   Culture   Final    NO GROWTH < 24 HOURS Performed at [facility], [address], [place], [place] [address]    Report Status PENDING  Incomplete  Culture, blood (routine x 2)     Status: None (Preliminary result)   Collection Time: 04/21/19  4:22 PM  Result Value Ref Range Status   Specimen Description LEFT ANTECUBITAL  Final   Special Requests   Final    BOTTLES DRAWN AEROBIC AND ANAEROBIC Blood Culture adequate volume   Culture   Final    NO GROWTH < 24 HOURS  Performed at [facility], [address] [place], [place] [address]    Report Status PENDING  Incomplete     Studies: Ct Head Wo Contrast  Result Date: 04/21/2019 CLINICAL DATA:  Encephalopathy EXAM: CT  HEAD WITHOUT CONTRAST TECHNIQUE: Contiguous axial images were obtained from the base of the skull through the vertex without intravenous contrast. COMPARISON:  04/14/2019 FINDINGS: Brain: No acute intracranial abnormality. Specifically, no hemorrhage, hydrocephalus, mass lesion, acute infarction, or significant intracranial injury. Calcification in the inferior right cerebellum, stable Vascular: No hyperdense vessel or unexpected calcification. Skull: No acute calvarial abnormality. Sinuses/Orbits: Mucosal thickening throughout the paranasal sinuses. Debris noted in the nasopharynx, stable since prior study. Other: None IMPRESSION: No acute intracranial abnormality. Electronically Signed   By: Charlett Nose M.D.   On: 04/21/2019 21:46   Dg Chest Port 1 View  Result Date: 04/21/2019 CLINICAL DATA:  Syncope. EXAM: PORTABLE CHEST 1 VIEW COMPARISON:  04/14/2019 FINDINGS: The heart size and mediastinal contours are within normal limits. Stable emphysematous lung disease. The lungs are hyperinflated bilaterally. There is no evidence of pulmonary edema, consolidation, pneumothorax, nodule or pleural fluid. The visualized skeletal structures are unremarkable. IMPRESSION: Emphysema and hyperinflation.  No acute findings. Electronically Signed   By: Irish Lack M.D.   On: 04/21/2019 16:37   Scheduled Meds: . enoxaparin (LOVENOX) injection  40 mg Subcutaneous Q24H  . feeding supplement (JEVITY 1.2 CAL)  237 mL Per Tube QID  . free water  300 mL Per Tube Q8H  . levothyroxine  50 mcg Intravenous Daily  . nystatin  5 mL Oral QID  . pravastatin  40 mg Per Tube Daily   Continuous Infusions: . 0.9 % NaCl with KCl 40 mEq / L 100 mL/hr (04/22/19 0214)    Principal Problem:   Lactic acidosis Active  Problems:   Hypertension   Dyslipidemia   Uncontrolled Hypothyroidism   Hyponatremia   Altered mental state   Hypoglycemia   Elevated TSH  Time spent:   Standley Dakins, MD [facility] 04/22/2019, 12:44 PM    LOS: 0 days  How to contact the [facility] Attending or Consulting provider 7A - 7P or covering provider during after hours 7P -7A, for this patient?  1. Check the care team in [facility] and look for a) attending/consulting [facility] provider listed and b) the [facility] team listed 2. Log into [email or web address] and use [place]'s universal password to access. If you do not have the password, please contact the hospital operator. 3. Locate the [facility] provider you are looking for under [facility] and page to a number that you can be directly reached. 4. If you still have difficulty reaching the provider, please page the [facility] (Director on Call) for the Hospitalists listed on amion for assistance.

## 2019-04-23 LAB — RENAL FUNCTION PANEL
Albumin: 3.2 g/dL — ABNORMAL LOW (ref 3.5–5.0)
Anion gap: 8 (ref 5–15)
BUN: 15 mg/dL (ref 8–23)
CO2: 24 mmol/L (ref 22–32)
Calcium: 8.7 mg/dL — ABNORMAL LOW (ref 8.9–10.3)
Chloride: 103 mmol/L (ref 98–111)
Creatinine, Ser: 0.59 mg/dL — ABNORMAL LOW (ref 0.61–1.24)
GFR calc Af Amer: >60 mL/min (ref >60)
GFR calc non Af Amer: >60 mL/min (ref >60)
Glucose, Bld: 149 mg/dL — ABNORMAL HIGH (ref 70–99)
Phosphorus: 1.9 mg/dL — ABNORMAL LOW (ref 2.5–4.6)
Potassium: 3.5 mmol/L (ref 3.5–5.1)
Sodium: 135 mmol/L (ref 135–145)

## 2019-04-23 LAB — GLUCOSE, CAPILLARY
Glucose-Capillary: 137 mg/dL — ABNORMAL HIGH (ref 70–99)
Glucose-Capillary: 147 mg/dL — ABNORMAL HIGH (ref 70–99)
Glucose-Capillary: 66 mg/dL — ABNORMAL LOW (ref 70–99)
Glucose-Capillary: 67 mg/dL — ABNORMAL LOW (ref 70–99)
Glucose-Capillary: 68 mg/dL — ABNORMAL LOW (ref 70–99)
Glucose-Capillary: 87 mg/dL (ref 70–99)
Glucose-Capillary: 92 mg/dL (ref 70–99)

## 2019-04-23 LAB — T3: T3, Total: 62 ng/dL — ABNORMAL LOW (ref 71–180)

## 2019-04-23 LAB — URINE CULTURE: Culture: NO GROWTH

## 2019-04-23 MED ORDER — DEXTROSE 50 % IV SOLN
INTRAVENOUS | Status: AC
  Administered 2019-04-23: 19:00:00 25 mL via INTRAVENOUS
  Filled 2019-04-23: qty 50

## 2019-04-23 MED ORDER — DEXTROSE 50 % IV SOLN
25.0000 mL | Freq: Once | INTRAVENOUS | Status: AC
  Administered 2019-04-23 (×2): 25 mL via INTRAVENOUS

## 2019-04-23 MED ORDER — LEVOTHYROXINE SODIUM 25 MCG/ML PO SOLN
100.0000 ug | Freq: Every day | ORAL | Status: DC
  Filled 2019-04-23 (×3): qty 4

## 2019-04-23 NOTE — Progress Notes (Signed)
Blood glucose at 1137 was 66.  Gave 25 mls iv dextrose.  Contacted Dr.Ogbata and he ordered to give another does.  IV dislodged and unabe to give another dose. Patient refused another IV.  Recheck of blood glucose was 92.  Gave dose of osmolite and added sugar to free water.  Contacted Dr. Marthenia Rolling about this.

## 2019-04-23 NOTE — Progress Notes (Signed)
PROGRESS NOTE  Brandon Hall  NFA:[id number]  DOB: 06/20/1956  DOA: 04/21/2019 PCP: Dettinger, Elige Radon, MD   Brief Admission Hx: 63 year old gentleman with osteoarthritis, pharyngeal cancer being treated with chemotherapy, with history of radiation to the neck, postradiation hypothyroidism, herpes zoster, hypertension, prostate cancer was admitted with transient altered mental status, dehydration, hypothermia.  04/23/2019: No new changes.  Good guarded prognosis.  Consider palliative care input.  MDM/Assessment & Plan:   Metabolic encephalopathy- resolved, patient seems to be back to his baseline however he cannot recall any history of epilepsy or prior seizures.  An EEG has been ordered.  His CT brain did not show any signs of metastatic disease.  He has a history of being overmedicated with pain medications which has in the past caused these symptoms.  Continue current treatments. 04/23/2019: No new changes.  Consider palliative care team input.  Lactic acidosis- suspect secondary to mild dehydration versus possible seizure activity.  He has responded very well to fluid hydration.  He has no signs or symptoms of infection that we have found.  He recently completed a course of Augmentin for aspiration pneumonia. 04/23/2019: Lactic acidosis has resolved.  Hypothyroidism:  DC IV levothyroxine  Start home dose of levothyroxine 100 MCG per PEG tube.    Hypoglycemia- check cortisol level as a screen for adrenal insufficiency.  Monitor CBG with serial testing.  He may need to be started on prednisone to prevent hypoglycemia. 04/23/2019: Likely secondary to decreased p.o. intake.   Hypothermia - RESOLVED.  Dyslipidemia he is being treated with pravastatin via PEG tube.  Essential hypertension-he has not required antihypertensives.  DVT prophylaxis: Lovenox Code Status: Full Family Communication:  Disposition Plan: Inpatient  Consultants:  Consider palliative care team.  Procedures:     Antimicrobials:     Subjective: Difficult communicating with patient. Left lower arm is swollen from IV infiltration.  Objective: Vitals:   04/23/19 0128 04/23/19 0642 04/23/19 0927 04/23/19 1319  BP: 109/77 110/69  118/71  Pulse:  95  86  Resp:  16  16  Temp:  98.1 F (36.7 C)  98.3 F (36.8 C)  TempSrc:  Oral  Oral  SpO2:  95% 92% 97%  Weight:      Height:        Intake/Output Summary (Last 24 hours) at 04/23/2019 1546 Last data filed at 04/23/2019 1513 Gross per 24 hour  Intake 0 ml  Output 200 ml  Net -200 ml   Filed Weights   04/22/19 0100 04/22/19 1519  Weight: 54 kg 51.4 kg     REVIEW OF SYSTEMS  As per history otherwise all reviewed and reported negative  Exam:  General exam: Emaciated chronically ill-appearing male he is a phasic and unable to speak however he is alert and oriented x3 and cooperative.  He has a lot of oral secretions. Neck: tracheostomy in place.  Respiratory system: Clear. No increased work of breathing. Cardiovascular system: S1 & S2 heard. No JVD, murmurs, gallops, clicks or pedal edema. Gastrointestinal system: PEG in place. Abdomen is nondistended, soft and nontender. Normal bowel sounds heard. Central nervous system: Alert and oriented. No focal neurological deficits. Extremities: Left lower arm is swollen.  This is from IV infiltration.  Data Reviewed: Basic Metabolic Panel: Recent Labs  Lab 04/20/19 1036 04/21/19 1621 04/21/19 2225 04/22/19 0452 04/23/19 0618  NA 132* 129* 133* 135 135  K 4.0 3.4* 3.6 4.3 3.5  CL 90* 89* 95* 101 103  CO2 25 23 25 26  24  GLUCOSE 83 101* 101* 69* 149*  BUN 15 21 16 14 15   CREATININE 0.83 0.90 0.75 0.60* 0.59*  CALCIUM 10.4* 10.0 9.1 9.2 8.7*  MG  --   --  2.3  --   --   PHOS  --   --  3.6  --  1.9*   Liver Function Tests: Recent Labs  Lab 04/20/19 1036 04/21/19 1621 04/22/19 0452 04/23/19 0618  AST 33 35 29  --   ALT 44 40 31  --   ALKPHOS 110 100 81  --   BILITOT 0.5 0.4  0.5  --   PROT 7.5 8.2* 6.9  --   ALBUMIN 4.4 4.2 3.5 3.2*   No results for input(s): LIPASE, AMYLASE in the last 168 hours. No results for input(s): AMMONIA in the last 168 hours. CBC: Recent Labs  Lab 04/20/19 1036 04/21/19 1621 04/22/19 0452  WBC 4.9 8.9 5.8  NEUTROABS 3.4 7.3 4.2  HGB 13.9 14.0 12.5*  HCT 42.8 45.1 39.7  MCV 79 83.5 82.9  PLT 359 374 312   Cardiac Enzymes: Recent Labs  Lab 04/21/19 1621  TROPONINI <0.03   CBG (last 3)  Recent Labs    04/23/19 0742 04/23/19 1137 04/23/19 1317  GLUCAP 137* 66* 92   Recent Results (from the past 240 hour(s))  Blood Culture (routine x 2)     Status: None   Collection Time: 04/14/19  1:30 PM  Result Value Ref Range Status   Specimen Description BLOOD RIGHT HAND  Final   Special Requests   Final    BOTTLES DRAWN AEROBIC AND ANAEROBIC Blood Culture adequate volume   Culture   Final    NO GROWTH 5 DAYS Performed at [facility], [address] [place], [place] [address]    Report Status 04/19/2019 FINAL  Final  Urine culture     Status: Abnormal   Collection Time: 04/14/19  1:30 PM  Result Value Ref Range Status   Specimen Description   Final    URINE, CLEAN CATCH Performed at [facility], [address], [place], [place] [address]    Special Requests   Final    NONE Performed at [facility], [address] [place], [place] [address]    Culture MULTIPLE SPECIES PRESENT, SUGGEST RECOLLECTION (A)  Final   Report Status 04/16/2019 FINAL  Final  SARS Coronavirus 2 [facility] order, Performed in [facility] hospital lab)     Status: None   Collection Time: 04/14/19  1:30 PM  Result Value Ref Range Status   SARS Coronavirus 2 NEGATIVE NEGATIVE Final    Comment: (NOTE) If result is NEGATIVE SARS-CoV-2 target nucleic acids are NOT DETECTED. The SARS-CoV-2 RNA is generally detectable in upper and lower  respiratory specimens during the acute phase of infection. The lowest  concentration of SARS-CoV-2 viral copies  this assay can detect is 250  copies / mL. A negative result does not preclude SARS-CoV-2 infection  and should not be used as the sole basis for treatment or other  patient management decisions.  A negative result may occur with  improper specimen collection / handling, submission of specimen other  than nasopharyngeal swab, presence of viral mutation(s) within the  areas targeted by this assay, and inadequate number of viral copies  (<250 copies / mL). A negative result must be combined with clinical  observations, patient history, and epidemiological information. If result is POSITIVE SARS-CoV-2 target nucleic acids are DETECTED. The SARS-CoV-2 RNA is generally detectable in  upper and lower  respiratory specimens dur ing the acute phase of infection.  Positive  results are indicative of active infection with SARS-CoV-2.  Clinical  correlation with patient history and other diagnostic information is  necessary to determine patient infection status.  Positive results do  not rule out bacterial infection or co-infection with other viruses. If result is PRESUMPTIVE POSTIVE SARS-CoV-2 nucleic acids MAY BE PRESENT.   A presumptive positive result was obtained on the submitted specimen  and confirmed on repeat testing.  While 2019 novel coronavirus  (SARS-CoV-2) nucleic acids may be present in the submitted sample  additional confirmatory testing may be necessary for epidemiological  and / or clinical management purposes  to differentiate between  SARS-CoV-2 and other Sarbecovirus currently known to infect humans.  If clinically indicated additional testing with an alternate test  methodology [phone]) is advised. The SARS-CoV-2 RNA is generally  detectable in upper and lower respiratory sp ecimens during the acute  phase of infection. The expected result is Negative. Fact Sheet for Patients:  BoilerBrush.com.cy Fact Sheet for Healthcare Providers:  https://pope.com/ This test is not yet approved or cleared by the [place] FDA and has been authorized for detection and/or diagnosis of SARS-CoV-2 by FDA under an Emergency Use Authorization (EUA).  This EUA will remain in effect (meaning this test can be used) for the duration of the COVID-19 declaration under Section 564(b)(1) of the Act, 21 U.S.C. section 360bbb-3(b)(1), unless the authorization is terminated or revoked sooner. Performed at [facility], [address] [place], [place] [address]   Blood Culture (routine x 2)     Status: None   Collection Time: 04/14/19  3:49 PM  Result Value Ref Range Status   Specimen Description BLOOD LEFT HAND  Final   Special Requests   Final    BOTTLES DRAWN AEROBIC AND ANAEROBIC Blood Culture results may not be optimal due to an inadequate volume of blood received in culture bottles   Culture   Final    NO GROWTH 5 DAYS Performed at [facility], [address], [place], [place] [address]    Report Status 04/19/2019 FINAL  Final  Culture, blood (routine x 2)     Status: None (Preliminary result)   Collection Time: 04/21/19  4:22 PM  Result Value Ref Range Status   Specimen Description BLOOD LEFT WRIST  Final   Special Requests   Final    BOTTLES DRAWN AEROBIC ONLY Blood Culture results may not be optimal due to an inadequate volume of blood received in culture bottles   Culture   Final    NO GROWTH 2 DAYS Performed at [facility], [address] [place], [place] [address]    Report Status PENDING  Incomplete  Culture, blood (routine x 2)     Status: None (Preliminary result)   Collection Time: 04/21/19  4:22 PM  Result Value Ref Range Status   Specimen Description LEFT ANTECUBITAL  Final   Special Requests   Final    BOTTLES DRAWN AEROBIC AND ANAEROBIC Blood Culture adequate volume   Culture   Final    NO GROWTH 2 DAYS Performed at [facility], [address] [place], [place] [address]    Report  Status PENDING  Incomplete  Urine culture     Status: None   Collection Time: 04/21/19  6:13 PM  Result Value Ref Range Status   Specimen Description   Final    URINE, CATHETERIZED Performed at [facility], [address] [place], [place] [address]    Special Requests   Final    NONE Performed at [facility], [address], [place], [place] [address]    Culture   Final    NO GROWTH Performed at [facility], [address], [place], [place] [address]    Report Status 04/23/2019 FINAL  Final     Studies: Ct Head Wo Contrast  Result Date: 04/21/2019 CLINICAL DATA:  Encephalopathy EXAM: CT HEAD WITHOUT CONTRAST TECHNIQUE: Contiguous axial images were obtained from the base of the skull through the vertex without intravenous contrast. COMPARISON:  04/14/2019 FINDINGS: Brain: No acute intracranial abnormality. Specifically, no hemorrhage, hydrocephalus, mass lesion, acute infarction, or significant intracranial injury. Calcification in the inferior right cerebellum, stable Vascular: No hyperdense vessel or unexpected calcification. Skull: No acute calvarial abnormality. Sinuses/Orbits: Mucosal thickening throughout the paranasal sinuses. Debris noted in the nasopharynx, stable since prior study. Other: None IMPRESSION: No acute intracranial abnormality. Electronically Signed   By: Charlett Nose M.D.   On: 04/21/2019 21:46   Dg Chest Port 1 View  Result Date: 04/21/2019 CLINICAL DATA:  Syncope. EXAM: PORTABLE CHEST 1 VIEW COMPARISON:  04/14/2019 FINDINGS: The heart size and mediastinal contours are within normal limits. Stable emphysematous lung disease. The lungs are hyperinflated bilaterally. There is no evidence of pulmonary edema, consolidation, pneumothorax, nodule or pleural fluid. The visualized skeletal structures are unremarkable. IMPRESSION: Emphysema and hyperinflation.  No acute findings. Electronically Signed   By: Irish Lack M.D.   On: 04/21/2019 16:37   Scheduled  Meds: . dextrose      . enoxaparin (LOVENOX) injection  40 mg Subcutaneous Q24H  . feeding supplement (OSMOLITE 1.5 CAL)  237 mL Per Tube 5 X Daily  . feeding supplement (PRO-STAT SUGAR FREE 64)  30 mL Per Tube Daily  . free water  300 mL Per Tube Q8H  . levothyroxine  50 mcg Intravenous Daily  . nystatin  5 mL Oral QID  . pravastatin  40 mg Per Tube Daily   Continuous Infusions: . 0.9 % NaCl with KCl 40 mEq / L 35 mL/hr (04/22/19 1253)    Principal Problem:   Lactic acidosis Active Problems:   Hypertension   Dyslipidemia   Uncontrolled Hypothyroidism   Hyponatremia   Altered mental state   Hypoglycemia   Elevated TSH  Time spent:   Barnetta Chapel, MD [facility] 04/23/2019, 3:46 PM    LOS: 1 day  How to contact the [facility] Attending or Consulting provider 7A - 7P or covering provider during after hours 7P -7A, for this patient?  1. Check the care team in [facility] and look for a) attending/consulting [facility] provider listed and b) the [facility] team listed 2. Log into [email or web address] and use [facility]'s universal password to access. If you do not have the password, please contact the hospital operator. 3. Locate the [facility] provider you are looking for under [facility] and page to a number that you can be directly reached. 4. If you still have difficulty reaching the provider, please page the [facility] (Director on Call) for the Hospitalists listed on amion for assistance.

## 2019-04-24 DIAGNOSIS — E872 Acidosis: Secondary | ICD-10-CM

## 2019-04-24 LAB — GLUCOSE, CAPILLARY
Glucose-Capillary: 103 mg/dL — ABNORMAL HIGH (ref 70–99)
Glucose-Capillary: 138 mg/dL — ABNORMAL HIGH (ref 70–99)
Glucose-Capillary: 77 mg/dL (ref 70–99)
Glucose-Capillary: 81 mg/dL (ref 70–99)

## 2019-04-24 MED ORDER — LEVOTHYROXINE SODIUM 100 MCG PO TABS
100.0000 ug | ORAL_TABLET | Freq: Every day | ORAL | Status: DC
  Administered 2019-04-24 – 2019-04-25 (×2): 100 ug
  Filled 2019-04-24: qty 1
  Filled 2019-04-24: qty 2

## 2019-04-24 NOTE — Progress Notes (Signed)
Walked with walker at steady pace around entire floor today.  No shortness of breath.  Feeding tube patent and bolus feedings continue.

## 2019-04-24 NOTE — Progress Notes (Signed)
PROGRESS NOTE  Brandon Hall  TFT:[id number]  DOB: Oct 04, 1956  DOA: 04/21/2019 PCP: Dettinger, Elige Radon, MD   Brief Admission Hx: 63 year old gentleman with osteoarthritis, pharyngeal cancer being treated with chemotherapy, with history of radiation to the neck, postradiation hypothyroidism, herpes zoster, hypertension, prostate cancer was admitted with transient altered mental status, dehydration, hypothermia.  04/24/2019: No new changes.  Good guarded prognosis.  Consider palliative care input.  MDM/Assessment & Plan:   Metabolic encephalopathy- resolved, patient seems to be back to his baseline however he cannot recall any history of epilepsy or prior seizures.  An EEG has been ordered.  His CT brain did not show any signs of metastatic disease.  He has a history of being overmedicated with pain medications which has in the past caused these symptoms.  Continue current treatments. 04/24/2019: No new changes.  Consider palliative care team input.  Lactic acidosis- suspect secondary to mild dehydration versus possible seizure activity.  He has responded very well to fluid hydration.  He has no signs or symptoms of infection that we have found.  He recently completed a course of Augmentin for aspiration pneumonia. 04/24/2019: Lactic acidosis has resolved.  Hypothyroidism:  Continue home dose of levothyroxine 100 MCG per PEG tube.    Hypoglycemia- check cortisol level as a screen for adrenal insufficiency.  Monitor CBG with serial testing.  He may need to be started on prednisone to prevent hypoglycemia. 04/23/2019: Likely secondary to decreased p.o. intake.   Hypothermia - RESOLVED.  Dyslipidemia he is being treated with pravastatin via PEG tube.  Essential hypertension-he has not required antihypertensives.  DVT prophylaxis: Lovenox Code Status: Full Family Communication:  Disposition Plan: Inpatient  Consultants:  Consider palliative care team.  Procedures:    Antimicrobials:      Subjective: Difficult communicating with patient. Left lower arm is swelling is improving.  Objective: Vitals:   04/23/19 2114 04/23/19 2116 04/24/19 0614 04/24/19 0800  BP: (!) 139/126 125/76 123/84   Pulse: 98 93 88   Resp:   15   Temp:   98.1 F (36.7 C)   TempSrc:   Oral   SpO2:  98% 98% 96%  Weight:      Height:        Intake/Output Summary (Last 24 hours) at 04/24/2019 1206 Last data filed at 04/24/2019 0900 Gross per 24 hour  Intake 1461.86 ml  Output 750 ml  Net 711.86 ml   Filed Weights   04/22/19 0100 04/22/19 1519  Weight: 54 kg 51.4 kg     REVIEW OF SYSTEMS  As per history otherwise all reviewed and reported negative  Exam:  General exam: Emaciated chronically ill-appearing male he is a phasic and unable to speak however he is alert and oriented x3 and cooperative.  He has a lot of oral secretions. Neck: tracheostomy in place.  Respiratory system: Clear. No increased work of breathing. Cardiovascular system: S1 & S2 heard. No JVD, murmurs, gallops, clicks or pedal edema. Gastrointestinal system: PEG in place. Abdomen is nondistended, soft and nontender. Normal bowel sounds heard. Central nervous system: Alert and oriented. No focal neurological deficits. Extremities: Left lower arm is swollen, but improving.  This is from IV infiltration.  Data Reviewed: Basic Metabolic Panel: Recent Labs  Lab 04/20/19 1036 04/21/19 1621 04/21/19 2225 04/22/19 0452 04/23/19 0618  NA 132* 129* 133* 135 135  K 4.0 3.4* 3.6 4.3 3.5  CL 90* 89* 95* 101 103  CO2 25 23 25 26 24   GLUCOSE 83 101* 101*  69* 149*  BUN 15 21 16 14 15   CREATININE 0.83 0.90 0.75 0.60* 0.59*  CALCIUM 10.4* 10.0 9.1 9.2 8.7*  MG  --   --  2.3  --   --   PHOS  --   --  3.6  --  1.9*   Liver Function Tests: Recent Labs  Lab 04/20/19 1036 04/21/19 1621 04/22/19 0452 04/23/19 0618  AST 33 35 29  --   ALT 44 40 31  --   ALKPHOS 110 100 81  --   BILITOT 0.5 0.4 0.5  --   PROT  7.5 8.2* 6.9  --   ALBUMIN 4.4 4.2 3.5 3.2*   No results for input(s): LIPASE, AMYLASE in the last 168 hours. No results for input(s): AMMONIA in the last 168 hours. CBC: Recent Labs  Lab 04/20/19 1036 04/21/19 1621 04/22/19 0452  WBC 4.9 8.9 5.8  NEUTROABS 3.4 7.3 4.2  HGB 13.9 14.0 12.5*  HCT 42.8 45.1 39.7  MCV 79 83.5 82.9  PLT 359 374 312   Cardiac Enzymes: Recent Labs  Lab 04/21/19 1621  TROPONINI <0.03   CBG (last 3)  Recent Labs    04/24/19 0001 04/24/19 0618 04/24/19 1118  GLUCAP 138* 81 77   Recent Results (from the past 240 hour(s))  Blood Culture (routine x 2)     Status: None   Collection Time: 04/14/19  1:30 PM  Result Value Ref Range Status   Specimen Description BLOOD RIGHT HAND  Final   Special Requests   Final    BOTTLES DRAWN AEROBIC AND ANAEROBIC Blood Culture adequate volume   Culture   Final    NO GROWTH 5 DAYS Performed at [facility], [address] [place], [place] [address]    Report Status 04/19/2019 FINAL  Final  Urine culture     Status: Abnormal   Collection Time: 04/14/19  1:30 PM  Result Value Ref Range Status   Specimen Description   Final    URINE, CLEAN CATCH Performed at [facility], [address], [place], [place] [address]    Special Requests   Final    NONE Performed at [facility], [address], [place], [place] [address]    Culture MULTIPLE SPECIES PRESENT, SUGGEST RECOLLECTION (A)  Final   Report Status 04/16/2019 FINAL  Final  SARS Coronavirus 2 [facility] order, Performed in [facility] hospital lab)     Status: None   Collection Time: 04/14/19  1:30 PM  Result Value Ref Range Status   SARS Coronavirus 2 NEGATIVE NEGATIVE Final    Comment: (NOTE) If result is NEGATIVE SARS-CoV-2 target nucleic acids are NOT DETECTED. The SARS-CoV-2 RNA is generally detectable in upper and lower  respiratory specimens during the acute phase of infection. The lowest  concentration of SARS-CoV-2 viral copies this assay can  detect is 250  copies / mL. A negative result does not preclude SARS-CoV-2 infection  and should not be used as the sole basis for treatment or other  patient management decisions.  A negative result may occur with  improper specimen collection / handling, submission of specimen other  than nasopharyngeal swab, presence of viral mutation(s) within the  areas targeted by this assay, and inadequate number of viral copies  (<250 copies / mL). A negative result must be combined with clinical  observations, patient history, and epidemiological information. If result is POSITIVE SARS-CoV-2 target nucleic acids are DETECTED. The SARS-CoV-2 RNA is generally detectable in upper and lower  respiratory specimens dur ing the acute phase of infection.  Positive  results are indicative of active infection with SARS-CoV-2.  Clinical  correlation with patient history and other diagnostic information is  necessary to determine patient infection status.  Positive results do  not rule out bacterial infection or co-infection with other viruses. If result is PRESUMPTIVE POSTIVE SARS-CoV-2 nucleic acids MAY BE PRESENT.   A presumptive positive result was obtained on the submitted specimen  and confirmed on repeat testing.  While 2019 novel coronavirus  (SARS-CoV-2) nucleic acids may be present in the submitted sample  additional confirmatory testing may be necessary for epidemiological  and / or clinical management purposes  to differentiate between  SARS-CoV-2 and other Sarbecovirus currently known to infect humans.  If clinically indicated additional testing with an alternate test  methodology [phone]) is advised. The SARS-CoV-2 RNA is generally  detectable in upper and lower respiratory sp ecimens during the acute  phase of infection. The expected result is Negative. Fact Sheet for Patients:  BoilerBrush.com.cy Fact Sheet for Healthcare Providers:  https://pope.com/ This test is not yet approved or cleared by the [place] FDA and has been authorized for detection and/or diagnosis of SARS-CoV-2 by FDA under an Emergency Use Authorization (EUA).  This EUA will remain in effect (meaning this test can be used) for the duration of the COVID-19 declaration under Section 564(b)(1) of the Act, 21 U.S.C. section 360bbb-3(b)(1), unless the authorization is terminated or revoked sooner. Performed at [facility], [address] [place], [place] [address]   Blood Culture (routine x 2)     Status: None   Collection Time: 04/14/19  3:49 PM  Result Value Ref Range Status   Specimen Description BLOOD LEFT HAND  Final   Special Requests   Final    BOTTLES DRAWN AEROBIC AND ANAEROBIC Blood Culture results may not be optimal due to an inadequate volume of blood received in culture bottles   Culture   Final    NO GROWTH 5 DAYS Performed at [facility], [address] [place], [place] [address]    Report Status 04/19/2019 FINAL  Final  Culture, blood (routine x 2)     Status: None (Preliminary result)   Collection Time: 04/21/19  4:22 PM  Result Value Ref Range Status   Specimen Description BLOOD LEFT WRIST  Final   Special Requests   Final    BOTTLES DRAWN AEROBIC ONLY Blood Culture results may not be optimal due to an inadequate volume of blood received in culture bottles   Culture   Final    NO GROWTH 3 DAYS Performed at [facility], [address], [place], [place] [address]    Report Status PENDING  Incomplete  Culture, blood (routine x 2)     Status: None (Preliminary result)   Collection Time: 04/21/19  4:22 PM  Result Value Ref Range Status   Specimen Description LEFT ANTECUBITAL  Final   Special Requests   Final    BOTTLES DRAWN AEROBIC AND ANAEROBIC Blood Culture adequate volume   Culture   Final    NO GROWTH 3 DAYS Performed at [facility], [address], [place], [place] [address]    Report  Status PENDING  Incomplete  Urine culture     Status: None   Collection Time: 04/21/19  6:13 PM  Result Value Ref Range Status   Specimen Description   Final    URINE, CATHETERIZED Performed at [facility], [address] [place], [place] [address]  Special Requests   Final    NONE Performed at [facility], [address], [place], [place] [address]    Culture   Final    NO GROWTH Performed at [facility], [address], [place], [place] [address]    Report Status 04/23/2019 FINAL  Final     Studies: No results found. Scheduled Meds: . enoxaparin (LOVENOX) injection  40 mg Subcutaneous Q24H  . feeding supplement (OSMOLITE 1.5 CAL)  237 mL Per Tube 5 X Daily  . feeding supplement (PRO-STAT SUGAR FREE 64)  30 mL Per Tube Daily  . free water  300 mL Per Tube Q8H  . levothyroxine  100 mcg Per Tube Q0600  . nystatin  5 mL Oral QID  . pravastatin  40 mg Per Tube Daily   Continuous Infusions: . 0.9 % NaCl with KCl 40 mEq / L 35 mL/hr at 04/24/19 0400    Principal Problem:   Lactic acidosis Active Problems:   Hypertension   Dyslipidemia   Uncontrolled Hypothyroidism   Hyponatremia   Altered mental state   Hypoglycemia   Elevated TSH  Time spent:   Barnetta Chapel, MD [facility] 04/24/2019, 12:06 PM    LOS: 2 days  How to contact the [facility] Attending or Consulting provider 7A - 7P or covering provider during after hours 7P -7A, for this patient?  1. Check the care team in [facility] and look for a) attending/consulting TR[facility] provider listed and b) the [facility] team listed 2. Log into [email or web address] and use [facility] universal password to access. If you do not have the password, please contact the hospital operator. 3. Locate the [facility] provider you are looking for under [facility] and page to a number that you can be directly reached. 4. If you still have difficulty reaching the provider, please page the [facility] (Director on Call) for the Hospitalists listed on  amion for assistance.

## 2019-04-24 NOTE — Plan of Care (Signed)
  Problem: Clinical Measurements: Goal: Will remain free from infection Outcome: Not Progressing   Problem: Safety: Goal: Ability to remain free from injury will improve Outcome: Not Progressing   Problem: Skin Integrity: Goal: Risk for impaired skin integrity will decrease Outcome: Not Progressing

## 2019-04-25 DIAGNOSIS — G9340 Encephalopathy, unspecified: Secondary | ICD-10-CM

## 2019-04-25 DIAGNOSIS — Z7189 Other specified counseling: Secondary | ICD-10-CM

## 2019-04-25 DIAGNOSIS — C329 Malignant neoplasm of larynx, unspecified: Secondary | ICD-10-CM

## 2019-04-25 LAB — CBC
HCT: 40.5 % (ref 39.0–52.0)
Hemoglobin: 12.2 g/dL — ABNORMAL LOW (ref 13.0–17.0)
MCH: 26.4 pg (ref 26.0–34.0)
MCHC: 30.1 g/dL (ref 30.0–36.0)
MCV: 87.7 fL (ref 80.0–100.0)
Platelets: 251 10*3/uL (ref 150–400)
RBC: 4.62 MIL/uL (ref 4.22–5.81)
RDW: 14.6 % (ref 11.5–15.5)
WBC: 6.5 10*3/uL (ref 4.0–10.5)
nRBC: 0 % (ref 0.0–0.2)

## 2019-04-25 LAB — COMPREHENSIVE METABOLIC PANEL
ALT: 53 U/L — ABNORMAL HIGH (ref 0–44)
AST: 60 U/L — ABNORMAL HIGH (ref 15–41)
Albumin: 3 g/dL — ABNORMAL LOW (ref 3.5–5.0)
Alkaline Phosphatase: 85 U/L (ref 38–126)
Anion gap: 9 (ref 5–15)
BUN: 16 mg/dL (ref 8–23)
CO2: 23 mmol/L (ref 22–32)
Calcium: 8.4 mg/dL — ABNORMAL LOW (ref 8.9–10.3)
Chloride: 102 mmol/L (ref 98–111)
Creatinine, Ser: 0.64 mg/dL (ref 0.61–1.24)
GFR calc Af Amer: >60 mL/min (ref >60)
GFR calc non Af Amer: >60 mL/min (ref >60)
Glucose, Bld: 196 mg/dL — ABNORMAL HIGH (ref 70–99)
Potassium: 3.7 mmol/L (ref 3.5–5.1)
Sodium: 134 mmol/L — ABNORMAL LOW (ref 135–145)
Total Bilirubin: 0.3 mg/dL (ref 0.3–1.2)
Total Protein: 5.9 g/dL — ABNORMAL LOW (ref 6.5–8.1)

## 2019-04-25 LAB — TYPE AND SCREEN
ABO/RH(D): AB POS
Antibody Screen: NEGATIVE

## 2019-04-25 LAB — GLUCOSE, CAPILLARY
Glucose-Capillary: 108 mg/dL — ABNORMAL HIGH (ref 70–99)
Glucose-Capillary: 109 mg/dL — ABNORMAL HIGH (ref 70–99)
Glucose-Capillary: 119 mg/dL — ABNORMAL HIGH (ref 70–99)
Glucose-Capillary: 94 mg/dL (ref 70–99)

## 2019-04-25 LAB — LACTIC ACID, PLASMA
Lactic Acid, Venous: 2.7 mmol/L (ref 0.5–1.9)
Lactic Acid, Venous: 2.1 mmol/L (ref 0.5–1.9)
Lactic Acid, Venous: 1.7 mmol/L (ref 0.5–1.9)

## 2019-04-25 LAB — TROPONIN I: Troponin I: <0.03 ng/mL (ref <0.03)

## 2019-04-25 LAB — PROCALCITONIN: Procalcitonin: <0.1 ng/mL

## 2019-04-25 MED ORDER — SODIUM CHLORIDE 0.9 % IV BOLUS
1000.0000 mL | Freq: Once | INTRAVENOUS | Status: AC
  Administered 2019-04-25: 1000 mL via INTRAVENOUS

## 2019-04-25 MED ORDER — LEVOTHYROXINE SODIUM 150 MCG PO TABS: 150.0000 ug | ORAL_TABLET | Freq: Every day | ORAL | 1 refills | Status: AC

## 2019-04-25 MED ORDER — LEVOTHYROXINE SODIUM 75 MCG PO TABS
150.0000 ug | ORAL_TABLET | Freq: Every day | ORAL | Status: DC
  Administered 2019-04-26: 150 ug
  Filled 2019-04-25: qty 2

## 2019-04-25 MED ORDER — LEVOTHYROXINE SODIUM 50 MCG PO TABS
50.0000 ug | ORAL_TABLET | Freq: Once | ORAL | Status: AC
  Administered 2019-04-25: 50 ug via ORAL
  Filled 2019-04-25: qty 1

## 2019-04-25 MED ORDER — OSMOLITE 1.5 CAL PO LIQD: 237.0000 mL | Freq: Every day | ORAL | 0 refills | Status: AC

## 2019-04-25 MED ORDER — SODIUM CHLORIDE 0.9 % IV BOLUS
1000.0000 mL | Freq: Once | INTRAVENOUS | Status: AC
  Administered 2019-04-25: 16:00:00 1000 mL via INTRAVENOUS

## 2019-04-25 NOTE — Evaluation (Signed)
Clinical/Bedside Swallow Evaluation Patient Details  Name: Brandon Hall MRN: [id number] Date of Birth: [birth year]  Today's Date: 04/25/2019 Time: SLP Start Time (ACUTE ONLY): 1248 SLP Stop Time (ACUTE ONLY): 1312 SLP Time Calculation (min) (ACUTE ONLY): 24 min  Past Medical History:  Past Medical History:  Diagnosis Date  . Arthritis   . Blindness of right eye   . Cancer (HCC)    pharyngeal ca  . History of radiation therapy 11/2014   TxN3MO squamous cell carcinoma of L neck, Stage IVB, unknown primary 70 Gy in 35 fractions treated at [facility], finished 12/25/14  . History of shingles    RASH  02-03-2014  PER PCP RESOLVING  . Hypertension    NO MEDS ,  MONITORED BY PCP  . Hypothyroidism, postradioiodine therapy   . Nonverbal   . Prostate cancer (HCC) 07/12/2014   DX  SEVERAL  YRS   AGO  WITH RADIATION THERAPY  . Squamous cell carcinoma of head and neck (HCC) 07/12/2014  . 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 COLONOSCOPY  10/20/2011   Procedure: COLONOSCOPY;  Surgeon: Corbin Ade, MD;  [facility];  Service: Endoscopy;  Laterality: N/A;  1:00 pm  . COLONOSCOPY N/A 02/03/2018   Procedure: COLONOSCOPY;  Surgeon: Corbin Ade, MD;  [facility];  Service: Endoscopy;  Laterality: N/A;  2:15  . CRYOABLATION N/A 04/07/2014   Procedure: SALVAGE CRYO ABLATION PROSTATE;  Surgeon: Su Grand, MD;  Location: [facility];  Service: Urology;  Laterality: N/A;  . DIRECT LARYNGOSCOPY N/A 09/06/2018   Procedure: DIRECT LARYNGOSCOPY biopsy with frozen section;  Surgeon: Serena Colonel, MD;  Location: [facility] OR;  Service: ENT;  Laterality: N/A;  . IR GASTROSTOMY TUBE MOD SED  08/20/2018  . IR GENERIC HISTORICAL  03/27/2017   IR REMOVAL TUN ACCESS W/ PORT W/O FL MOD SED [facility]  . LARYNGETOMY N/A 09/06/2018   Procedure: total LARYNGECTOMY;  Surgeon: Serena Colonel,  MD;  Location: [facility] OR;  Service: ENT;  Laterality: N/A;  . LARYNGOSCOPY    . LYMPH NODE BIOPSY    . RADICAL NECK DISSECTION Right 09/06/2018   Procedure: Modified NECK DISSECTION;  Surgeon: Serena Colonel, MD;  Location: [facility] OR;  Service: ENT;  Laterality: Right;   HPI:  63 year old male with a history of Graves' disease, hypertension, prostate cancer, recurrent laryngeal carcinoma status post chemo radiation presented with altered mental status.  Apparently, the patient had slid out of his recliner and stiffened up without any reported seizure activity.   EMS states pt initially could not stand because he "was shaky" but then was able to stand. Pt is non-verbal per baseline and gestures his needs (ie: pointed to his chair when EMS was in his home). EMS states they have made several call outs to pt's home to help pt up. No reported fevers, cough, vomiting/diarrhea, focal motor weakness.  According to the medical record, the patient has had a history of being overmedicated with opioid medications.  Upon presentation, the patient was noted to have lactic acid 3.7.  He was fluid resuscitated.  CT of the brain was negative for metastatic disease.  EEG was negative for epileptiform discharges.  The patient recently had hospital admission from 04/14/2019 through 04/15/2019 for aspiration pneumonitis.  He is finished a home course of Augmentin.  Upon presentation, the patient was noted to be hypothermic and hypoglycemic.  A.m. cortisol was 21.4.  The patient was fluid resuscitated with improvement of his mental status.  In the early a.m. 04/25/2019, there was concern about possible hypotension with a blood pressure 65/54.  The patient was given 1 L bolus.  However, upon evaluation the patient was asymptomatic and appeared nontoxic.  Repeat vital signs showed heart rate 86 with blood pressure 139/76 with oxygen saturation 100% room air.   Assessment / Plan / Recommendation Clinical Impression  Consult received for BSE. Pt  with a total laryngectomy and was able to tolerate some liquids at home, however he is no longer able to do so likely due to oral cancer and significant swelling. Pt recently admitted with suspected aspiration PNA, however Pt with total laryngectomy so anatomically he is unable to aspirate oral secretions/po unless he has a fistula. Pt does NOT have a covering for his laryngectomy site and could be aspirating there. Pt should cover his stoma to protect. Pt with severe oral phase dysphagia and suspected pharyngeal phase and is unable to initiate a swallow or move bolus in oral cavity due to significant swelling to right side. Recommend ice chips for comfort and expectoration, oral care. Pt does not want to take anything by mouth at this time. Recommend palliative involvement. Pt should be able to receive covering for his stoma through [employer], however unsure if Pt would follow through. Continue PEG feeds and NPO.   SLP Visit Diagnosis: Dysphagia, oral phase (R13.11);Dysphagia, oropharyngeal phase (R13.12)    Aspiration Risk  Risk for inadequate nutrition/hydration    Diet Recommendation Ice chips PRN after oral care;Alternative means - long-term;NPO   Medication Administration: Via alternative means    Other  Recommendations Oral Care Recommendations: Oral care BID;Staff/trained caregiver to provide oral care Other Recommendations: Have oral suction available   Follow up Recommendations None      Frequency and Duration            Prognosis Prognosis for Safe Diet Advancement: Guarded Barriers to Reach Goals: Severity of deficits      Swallow Study   General Date of Onset: 04/21/19 HPI: 63 year old male with a history of Graves' disease, hypertension, prostate cancer, recurrent laryngeal carcinoma status post chemo radiation presented with altered mental status.  Apparently, the patient had slid out of his recliner and stiffened up without any reported seizure activity.   EMS states pt  initially could not stand because he "was shaky" but then was able to stand. Pt is non-verbal per baseline and gestures his needs (ie: pointed to his chair when EMS was in his home). EMS states they have made several call outs to pt's home to help pt up. No reported fevers, cough, vomiting/diarrhea, focal motor weakness.  According to the medical record, the patient has had a history of being overmedicated with opioid medications.  Upon presentation, the patient was noted to have lactic acid 3.7.  He was fluid resuscitated.  CT of the brain was negative for metastatic disease.  EEG was negative for epileptiform discharges.  The patient recently had hospital admission from 04/14/2019 through 04/15/2019 for aspiration pneumonitis.  He is finished a home course of Augmentin.  Upon presentation, the patient was noted to be hypothermic and hypoglycemic.  A.m. cortisol was 21.4.  The patient was fluid resuscitated with improvement of his mental status.  In the early a.m. 04/25/2019, there was concern about possible hypotension with a blood pressure 65/54.  The patient was given 1 L bolus.  However, upon evaluation the  patient was asymptomatic and appeared nontoxic.  Repeat vital signs showed heart rate 86 with blood pressure 139/76 with oxygen saturation 100% room air. Type of Study: Bedside Swallow Evaluation Previous Swallow Assessment: None on record Diet Prior to this Study: NPO;PEG tube Temperature Spikes Noted: No Respiratory Status: Room air History of Recent Intubation: No Behavior/Cognition: Alert;Cooperative;Requires cueing Oral Cavity Assessment: Excessive secretions Oral Care Completed by SLP: Yes Oral Cavity - Dentition: Poor condition;Missing dentition Vision: Functional for self-feeding Self-Feeding Abilities: Other (Comment) Patient Positioning: Upright in bed Baseline Vocal Quality: (laryngectomy) Volitional Swallow: Unable to elicit    Oral/Motor/Sensory Function Overall Oral Motor/Sensory  Function: Severe impairment Facial Symmetry: Abnormal symmetry right Lingual ROM: Reduced right;Reduced left Lingual Symmetry: Abnormal symmetry right Lingual Strength: Reduced Mandible: Impaired   Ice Chips Ice chips: Impaired Presentation: Spoon Oral Phase Impairments: Reduced lingual movement/coordination;Reduced labial seal Oral Phase Functional Implications: Right anterior spillage(Pt expectorated) Pharyngeal Phase Impairments: Unable to trigger swallow   Thin Liquid Thin Liquid: Not tested    Nectar Thick Nectar Thick Liquid: Not tested   Honey Thick Honey Thick Liquid: Not tested   Puree Puree: Not tested   Solid     Solid: Not tested     Thank you,  Havery Moros, CCC-SLP [phone]  Dontel Harshberger 04/25/2019,1:26 PM

## 2019-04-25 NOTE — Plan of Care (Signed)
  Problem: Acute Rehab PT Goals(only PT should resolve) Goal: Pt Will Go Supine/Side To Sit Outcome: Progressing Flowsheets (Taken 04/25/2019 0930) Pt will go Supine/Side to Sit: Independently Goal: Patient Will Transfer Sit To/From Stand Outcome: Progressing Flowsheets (Taken 04/25/2019 0930) Patient will transfer sit to/from stand: with supervision Goal: Pt Will Transfer Bed To Chair/Chair To Bed Outcome: Progressing Flowsheets (Taken 04/25/2019 0930) Pt will Transfer Bed to Chair/Chair to Bed: with supervision Goal: Pt Will Ambulate Outcome: Progressing Flowsheets (Taken 04/25/2019 0930) Pt will Ambulate: > 125 feet; with supervision; with rolling walker   9:31 AM, 04/25/19 Lonell Grandchild, MPT Physical Therapist with [facility] [phone] office [phone] mobile phone

## 2019-04-25 NOTE — Care Management Important Message (Signed)
Important Message  Patient Details  Name: Brandon Hall MRN: [id number] Date of Birth: 1956-11-23   Medicare Important Message Given:  Yes    Tommy Medal 04/25/2019, 1:50 PM

## 2019-04-25 NOTE — Progress Notes (Signed)
Noted pt's BP was very low.  Had Dodi, NT recheck BP.  BP was still showing very low.  Dr. Olevia Bowens notified.  Robbie, ICU RN notified that pt's BP was low, but that pt looked okay.  Was receptive and nodding appropriately.  NS bolus started.  The bolus blew pt's IV.  New IV started.  22g to R FA.  Per Dr. Everlena Cooper let IVF free flow instead of using the pump.  Manual BP done.  SBP 90s.  Pt restless, which has been his baseline for me tonight.  Discussed medications with Heath Lark and Dr. Olevia Bowens.  NO medications given that would have lowered her BP.  CBG was WNL.  Pt has no complaints and when asked if he feels bad he shakes his head no.  IV wrapped with kerlix to protect because it took multiple attempts by Heath Lark, ICU RN and Abby, [facility] and myself to get a PIV.  Dr. Olevia Bowens requested sitter to make sure he does not lose her PIV d/t hypotension.  Will sit with pt until NT on day shift arrives.  Pt's SBP now showing 86/45 with a HR in the 80s

## 2019-04-25 NOTE — Progress Notes (Signed)
Night shift telemetry coverage note.  The patient was seen due to hypotension of 46/33 mmHg and tachycardia in the 110s.  He was afebrile, respirations were normal between 16 to 18/min.  He denied fever, chills, headache, sore throat, chest pain, dyspnea, palpitations, dizziness, diaphoresis, abdominal pain, diarrhea, dysuria or hematuria.  He stated that he felt fine.  HEENT normocephalic. Neck supple, positive tracheostomy. Lungs clear to auscultation. Cardiovascular: S1-S2, initially tachycardic, most recently pulse is in the 80s. Abdomen: PEG in place.  Soft, Extremities: no edema clubbing or cyanosis.  Hypotension and tachycardia of unknown etiology. He has responded very well to IV fluids. CBC, CMP, lactic acid, troponin, type and screen and EKG ordered. EKG was nonischemic.  Tennis Must, MD.  About 35 minutes of critical care time were used during this event.  This document was prepared using Dragon voice recognition software and may contain some unintended transcription errors.

## 2019-04-25 NOTE — Evaluation (Signed)
Physical Therapy Evaluation Patient Details Name: Brandon Hall MRN: [id number] DOB: [birth year] Today's Date: 04/25/2019   History of Present Illness  Brandon Hall is a 63 y.o. male with medical history significant of osteoarthritis, blindness of the right eye, pharyngeal cancer, history of radiation therapy to the neck, history of postradiation hypothyroidism, history of herpes zoster, hypertension, prostate cancer who was brought to the emergency department via EMS after a sliding from his recliner at home and becoming stiff and transiently altered.  The patient is nonverbal and unable to provide further information.  His wife did not add any further information to the history.  EMS did not describe the patient as being postictal and there is no history of known seizure disorder.    Clinical Impression  Patient functioning near baseline for functional mobility and gait, demonstrates slightly labored movement for sit to stands, transfers and ambulation in hallway without loss of balance using RW, has to lean on nearby objects for support when not using an AD and put back to bed with sitter in room after therapy.    Follow Up Recommendations Home health PT;Supervision - Intermittent;Supervision for mobility/OOB    Equipment Recommendations  None recommended by PT    Recommendations for Other Services       Precautions / Restrictions Precautions Precautions: Fall Restrictions Weight Bearing Restrictions: No      Mobility  Bed Mobility Overal bed mobility: Modified Independent                Transfers Overall transfer level: Needs assistance Equipment used: 1 person hand held assist;Rolling walker (2 wheeled) Transfers: Sit to/from [employer] Sit to Stand: Min guard Stand pivot transfers: Min guard       General transfer comment: slightly labored movement with tendency to lean on nearby objects for support  Ambulation/Gait Ambulation/Gait assistance: Min  guard Gait Distance (Feet): 100 Feet Assistive device: Rolling walker (2 wheeled) Gait Pattern/deviations: Decreased step length - right;Decreased step length - left;Decreased stride length Gait velocity: decreased   General Gait Details: slightly unsteady cadence with tendency to lean on nearby objects for support, required use of RW, had to make wide turns, no loss of balance  Stairs            Wheelchair Mobility    Modified Rankin (Stroke Patients Only)       Balance Overall balance assessment: Needs assistance Sitting-balance support: Feet supported;No upper extremity supported Sitting balance-Leahy Scale: Good     Standing balance support: During functional activity;No upper extremity supported Standing balance-Leahy Scale: Poor Standing balance comment: fair/poor without AD, fair/good using RW                             Pertinent Vitals/Pain Pain Assessment: No/denies pain    Home Living Family/patient expects to be discharged to:: Private residence Living Arrangements: Spouse/significant other Available Help at Discharge: Family Type of Home: Apartment Home Access: Level entry     Home Layout: One level Home Equipment: Environmental consultant - 2 wheels      Prior Function Level of Independence: Independent with assistive device(s)         Comments: household and short distanced community ambulator with RW     Hand Dominance        Extremity/Trunk Assessment   Upper Extremity Assessment Upper Extremity Assessment: Generalized weakness    Lower Extremity Assessment Lower Extremity Assessment: Generalized weakness    Cervical / Trunk Assessment  Cervical / Trunk Assessment: Normal  Communication   Communication: Expressive difficulties;Other (comment)(Patient is nonverbal)  Cognition Arousal/Alertness: Awake/alert Behavior During Therapy: WFL for tasks assessed/performed Overall Cognitive Status: Within Functional Limits for tasks  assessed                                        General Comments      Exercises     Assessment/Plan    PT Assessment Patient needs continued PT services  PT Problem List Decreased strength;Decreased activity tolerance;Decreased balance;Decreased mobility       PT Treatment Interventions Therapeutic exercise;Gait training;Stair training;Functional mobility training;Therapeutic activities;Patient/family education    PT Goals (Current goals can be found in the Care Plan section)  Acute Rehab PT Goals Patient Stated Goal: return home with spouse to assist PT Goal Formulation: With patient Time For Goal Achievement: 05/02/19 Potential to Achieve Goals: Good    Frequency Min 3X/week   Barriers to discharge        Co-evaluation               AM-PAC PT "6 Clicks" Mobility  Outcome Measure Help needed turning from your back to your side while in a flat bed without using bedrails?: None Help needed moving from lying on your back to sitting on the side of a flat bed without using bedrails?: None Help needed moving to and from a bed to a chair (including a wheelchair)?: A Little Help needed standing up from a chair using your arms (e.g., wheelchair or bedside chair)?: A Little Help needed to walk in hospital room?: A Little Help needed climbing 3-5 steps with a railing? : A Little 6 Click Score: 20    End of Session   Activity Tolerance: Patient tolerated treatment well;Patient limited by fatigue Patient left: in bed;with call bell/phone within reach;with nursing/sitter in room Nurse Communication: Mobility status PT Visit Diagnosis: Unsteadiness on feet (R26.81);Other abnormalities of gait and mobility (R26.89);Muscle weakness (generalized) (M62.81)    Time: 9629-5284 PT Time Calculation (min) (ACUTE ONLY): 29 min   Charges:   PT Evaluation $PT Eval Moderate Complexity: 1 Mod PT Treatments $Therapeutic Activity: 23-37 mins        9:24 AM,  04/25/19 [name], MPT Physical Therapist with [facility] [phone] office [phone] mobile phone

## 2019-04-25 NOTE — Discharge Summary (Addendum)
Physician Discharge Summary  Brandon Hall ION:[id number] DOB: May 02, 1956 DOA: 04/21/2019  PCP: Dettinger, Elige Radon, MD  Admit date: 04/21/2019 Discharge date: 04/25/2019  Admitted From: Home Disposition:  Home  Recommendations for Outpatient Follow-up:  1. Follow up with PCP in 1-2 weeks 2. Please obtain BMP/CBC in one week 3. Please follow up on the following pending results:  Home Health: YES Equipment/Devices: HHPT  Discharge Condition: Stable CODE STATUS: FULL Diet recommendation: Osmolite 1.5, 5 cans daily, prostat 30 cc daily, free water 300 cc q 8h   Brief/Interim Summary: 63 year old male with a history of Graves' disease, hypertension, prostate cancer, recurrent laryngeal carcinoma status post chemo radiation presented with altered mental status.  Apparently, the patient had slid out of his recliner and stiffened up without any reported seizure activity.   EMS states pt initially could not stand because he "was shaky" but then was able to stand. Pt is non-verbal per baseline and gestures his needs (ie: pointed to his chair when EMS was in his home). EMS states they have made several call outs to pt's home to help pt up. No reported fevers, cough, vomiting/diarrhea, focal motor weakness.  According to the medical record, the patient has had a history of being overmedicated with opioid medications.  Upon presentation, the patient was noted to have lactic acid 3.7.  He was fluid resuscitated.  CT of the brain was negative for metastatic disease.  EEG was negative for epileptiform discharges.  The patient recently had hospital admission from 04/14/2019 through 04/15/2019 for aspiration pneumonitis.  He is finished a home course of Augmentin.  Upon presentation, the patient was noted to be hypothermic and hypoglycemic.  A.m. cortisol was 21.4.  The patient was fluid resuscitated with improvement of his mental status.  In the early a.m. 04/25/2019, there was concern about possible hypotension  with a blood pressure 65/54.  The patient was given 1 L bolus.  However, upon evaluation the patient was asymptomatic and appeared nontoxic.  Repeat vital signs showed heart rate 86 with blood pressure 139/76 with oxygen saturation 100% room air.  Discharge Diagnoses:  Acute metabolic encephalopathy -Likely combination of overmedication with opioids as well as dehydration -Mental status back to baseline -In retrospect given the patient's presentation and constellation of symptoms the patient likely had dehydration with poor intake -resolved   Lactic acidosis -Secondary to volume depletion -Improved with fluid resuscitation -Blood cultures negative -pt remains afebrile and hemodynamically stable -Urinalysis negative for pyuria, but did show ketonuria  Hypoglycemia -A.m. 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  Greater than 50% spent face to face counseling and coordinating care.      Discharge Instructions   Allergies as of 04/25/2019   No Known Allergies     Medication List    STOP taking these medications   amoxicillin-clavulanate 250-62.5 MG/5ML suspension Commonly known as:  AUGMENTIN     TAKE these medications   feeding supplement (  OSMOLITE 1.5 CAL) Liqd Place 237 mLs into feeding tube 5 (five) times daily. What changed:  when to take  this   free water Soln Place 300 mLs into feeding tube every 8 (eight) hours.   HYDROcodone-acetaminophen 7.5-325 mg/15 ml solution Commonly known as:  HYCET TAKE (2) TEASPOONFULS EVERY FOUR HOURS AS NEEDED FOR PAIN. What changed:  See the new instructions.   KEYTRUDA IV Inject 200 mg into the vein every 21 ( twenty-one) days.   levothyroxine 150 MCG tablet Commonly known as:  SYNTHROID Place 1 tablet (150 mcg total) into feeding tube daily at 6 (six) AM. Start taking on:  April 26, 2019 What changed:    medication strength  how much to take  when to take this   magic mouthwash Soln Take 5 mLs by mouth 3 (three) times daily as needed for mouth pain.   nystatin 100000 UNIT/ML suspension Commonly known as:  MYCOSTATIN Take 5 mLs (500,000 Units total) by mouth 4 (four) times daily.   polyethylene glycol 17 g packet Commonly known as:  MIRALAX / GLYCOLAX Take 17 g by mouth daily as needed for mild constipation or moderate constipation.   pravastatin 40 MG tablet Commonly known as:  PRAVACHOL Take 1 tablet (40 mg total) by mouth daily. What changed:  how to take this   prochlorperazine 10 MG tablet Commonly known as:  COMPAZINE Take 1 tablet (10 mg total) by mouth every 6 (six) hours as needed (Nausea or vomiting). What changed:    how much to take  how to take this  reasons to take this   Vitamin D (Ergocalciferol) 1.25 MG (50000 UT) Caps capsule Commonly known as:  DRISDOL TAKE 1 CAPSULE EVERY 7 DAYS. What changed:  See the new instructions.       No Known Allergies  Consultations:  palliative   Procedures/Studies: Ct Head Wo Contrast  Result Date: 04/21/2019 CLINICAL DATA:  Encephalopathy EXAM: CT HEAD WITHOUT CONTRAST TECHNIQUE: Contiguous axial images were obtained from the base of the skull through the vertex without intravenous contrast. COMPARISON:  04/14/2019 FINDINGS: Brain: No acute intracranial abnormality. Specifically, no hemorrhage,  hydrocephalus, mass lesion, acute infarction, or significant intracranial injury. Calcification in the inferior right cerebellum, stable Vascular: No hyperdense vessel or unexpected calcification. Skull: No acute calvarial abnormality. Sinuses/Orbits: Mucosal thickening throughout the paranasal sinuses. Debris noted in the nasopharynx, stable since prior study. Other: None IMPRESSION: No acute intracranial abnormality. Electronically Signed   By: Charlett Nose M.D.   On: 04/21/2019 21:46   Ct Head Wo Contrast  Result Date: 04/14/2019 CLINICAL DATA:  63 y/o M; Altered level of consciousness (LOC), unexplained. EXAM: CT HEAD WITHOUT CONTRAST TECHNIQUE: Contiguous axial images were obtained from the base of the skull through the vertex without intravenous contrast. COMPARISON:  03/27/2019 CT head. FINDINGS: Brain: No evidence of acute infarction, hemorrhage, hydrocephalus, extra-axial collection or mass effect. Stable nonspecific white matter hypodensities compatible with mild chronic microvascular ischemic changes and stable volume loss of the brain. Stable levin mm calcified lesion within the right inferior cerebellar hemisphere. Vascular: Calcific atherosclerosis of the carotid siphons. No hyperdense vessel identified. Skull: Normal. Negative for fracture or focal lesion. Sinuses/Orbits: Diffuse paranasal sinus mucosal thickening, right maxillary sinus fluid level, and debris within the nasopharynx, similar to prior CT head. Normal aeration of mastoid air cells. Hyperdense right globe. Normal left orbit. Other: None. IMPRESSION: 1. No acute intracranial abnormality identified. 2. Stable mild chronic microvascular ischemic changes and parenchymal volume loss of the  brain. 3. Stable right inferior cerebellar hemisphere calcified lesion. Electronically Signed   By: Mitzi Hansen M.D.   On: 04/14/2019 20:15   Ct Head Wo Contrast  Result Date: 03/27/2019 CLINICAL DATA:  Questionable seizure. Abnormal  neurologic exam. Nontraumatic. EXAM: CT HEAD WITHOUT CONTRAST TECHNIQUE: Contiguous axial images were obtained from the base of the skull through the vertex without intravenous contrast. COMPARISON:  02/16/2019 FINDINGS: Brain: No evidence of acute infarction, hemorrhage, hydrocephalus, extra-axial collection or mass lesion/mass effect. Prominence of the sulci and ventricles compatible with brain atrophy. Unchanged appearance of 1.2 cm calcified lesion in the anterior inferior right cerebellum. Vascular: No hyperdense vessel or unexpected calcification. Skull: Normal. Negative for fracture or focal lesion. Sinuses/Orbits: Bilateral maxillary sinus mucosal thickening identified. Air-fluid level within the right maxillary sinus also noted. Mastoid air cells are clear. Other: None. IMPRESSION: 1. No acute intracranial abnormality. 2. Unchanged 1.3 cm calcified lesion in the right cerebellum. 3. Chronic brain atrophy. Electronically Signed   By: Signa Kell M.D.   On: 03/27/2019 20:01   Ct Angio Chest Pe W Or Wo Contrast  Result Date: 04/14/2019 CLINICAL DATA:  Increasing jaw pain, shortness of breath and cough, initial encounter EXAM: CT ANGIOGRAPHY CHEST WITH CONTRAST TECHNIQUE: Multidetector CT imaging of the chest was performed using the standard protocol during bolus administration of intravenous contrast. Multiplanar CT image reconstructions and MIPs were obtained to evaluate the vascular anatomy. CONTRAST:  OMNIPAQUE 350 COMPARISON:  04/14/2019, 01/26/2019 PET-CT FINDINGS: Cardiovascular: Thoracic aorta demonstrates atherosclerotic calcifications without aneurysmal dilatation. No cardiac enlargement is seen. Coronary calcifications are noted. The pulmonary artery shows a normal branching pattern. No filling defects to suggest pulmonary embolism are identified. Mediastinum/Nodes: Thoracic inlet demonstrates evidence of prior tracheostomy as well as some left cervical adenopathy similar to that seen on  prior PET-CT. No hilar or mediastinal adenopathy is noted. The esophagus is within normal limits. Lungs/Pleura: Lungs demonstrate diffuse emphysematous changes. No focal mass lesion or sizable effusion is noted. No focal infiltrate is seen. Scarring is noted in the apices bilaterally. Upper Abdomen: Visualized upper abdomen shows no acute abnormality. Musculoskeletal: Degenerative changes of the cervical and thoracic spine are seen. Review of the MIP images confirms the above findings. IMPRESSION: No evidence of pulmonary emboli. Changes of prior tracheostomy. Left cervical lymphadenopathy stable from prior PET-CT Aortic Atherosclerosis (ICD10-I70.0) and Emphysema (ICD10-J43.9). Electronically Signed   By: Alcide Clever M.D.   On: 04/14/2019 22:17   Dg Chest Port 1 View  Result Date: 04/21/2019 CLINICAL DATA:  Syncope. EXAM: PORTABLE CHEST 1 VIEW COMPARISON:  04/14/2019 FINDINGS: The heart size and mediastinal contours are within normal limits. Stable emphysematous lung disease. The lungs are hyperinflated bilaterally. There is no evidence of pulmonary edema, consolidation, pneumothorax, nodule or pleural fluid. The visualized skeletal structures are unremarkable. IMPRESSION: Emphysema and hyperinflation.  No acute findings. Electronically Signed   By: Irish Lack M.D.   On: 04/21/2019 16:37   Dg Chest Port 1 View  Result Date: 04/14/2019 CLINICAL DATA:  Cough EXAM: PORTABLE CHEST 1 VIEW COMPARISON:  February 16, 2019 FINDINGS: There is mild atelectatic change in the right mid lung. There is no edema or consolidation. Heart size and pulmonary vascularity are normal. No adenopathy. No bone lesions. IMPRESSION: Mild right midlung atelectasis. Lungs elsewhere clear. Stable cardiac silhouette. Electronically Signed   By: Bretta Bang III M.D.   On: 04/14/2019 14:11        Discharge Exam: Vitals:   04/25/19 6295 04/25/19 2841  BP: (!) 65/54 (!) 46/33  Pulse: (!) 136 (!) 119  Resp: 18   Temp:   (!) 97.4 F (36.3 C)  SpO2: 100%    Vitals:   04/24/19 1655 04/24/19 2107 04/25/19 0515 04/25/19 0544  BP:  139/86 (!) 65/54 (!) 46/33  Pulse:  91 (!) 136 (!) 119  Resp:  16 18   Temp:  98.3 F (36.8 C)  (!) 97.4 F (36.3 C)  TempSrc:  Oral  Oral  SpO2: 95% 100% 100%   Weight:      Height:        General: Pt is alert, awake, not in acute distress Cardiovascular: RRR, S1/S2 +, no rubs, no gallops Respiratory: bibasilar rales. Abdominal: Soft, NT, ND, bowel sounds + Extremities: no edema, no cyanosis   The results of significant diagnostics from this hospitalization (including imaging, microbiology, ancillary and laboratory) are listed below for reference.    Significant Diagnostic Studies: Ct Head Wo Contrast  Result Date: 04/21/2019 CLINICAL DATA:  Encephalopathy EXAM: CT HEAD WITHOUT CONTRAST TECHNIQUE: Contiguous axial images were obtained from the base of the skull through the vertex without intravenous contrast. COMPARISON:  04/14/2019 FINDINGS: Brain: No acute intracranial abnormality. Specifically, no hemorrhage, hydrocephalus, mass lesion, acute infarction, or significant intracranial injury. Calcification in the inferior right cerebellum, stable Vascular: No hyperdense vessel or unexpected calcification. Skull: No acute calvarial abnormality. Sinuses/Orbits: Mucosal thickening throughout the paranasal sinuses. Debris noted in the nasopharynx, stable since prior study. Other: None IMPRESSION: No acute intracranial abnormality. Electronically Signed   By: Charlett Nose M.D.   On: 04/21/2019 21:46   Ct Head Wo Contrast  Result Date: 04/14/2019 CLINICAL DATA:  63 y/o M; Altered level of consciousness (LOC), unexplained. EXAM: CT HEAD WITHOUT CONTRAST TECHNIQUE: Contiguous axial images were obtained from the base of the skull through the vertex without intravenous contrast. COMPARISON:  03/27/2019 CT head. FINDINGS: Brain: No evidence of acute infarction, hemorrhage,  hydrocephalus, extra-axial collection or mass effect. Stable nonspecific white matter hypodensities compatible with mild chronic microvascular ischemic changes and stable volume loss of the brain. Stable levin mm calcified lesion within the right inferior cerebellar hemisphere. Vascular: Calcific atherosclerosis of the carotid siphons. No hyperdense vessel identified. Skull: Normal. Negative for fracture or focal lesion. Sinuses/Orbits: Diffuse paranasal sinus mucosal thickening, right maxillary sinus fluid level, and debris within the nasopharynx, similar to prior CT head. Normal aeration of mastoid air cells. Hyperdense right globe. Normal left orbit. Other: None. IMPRESSION: 1. No acute intracranial abnormality identified. 2. Stable mild chronic microvascular ischemic changes and parenchymal volume loss of the brain. 3. Stable right inferior cerebellar hemisphere calcified lesion. Electronically Signed   By: Mitzi Hansen M.D.   On: 04/14/2019 20:15   Ct Head Wo Contrast  Result Date: 03/27/2019 CLINICAL DATA:  Questionable seizure. Abnormal neurologic exam. Nontraumatic. EXAM: CT HEAD WITHOUT CONTRAST TECHNIQUE: Contiguous axial images were obtained from the base of the skull through the vertex without intravenous contrast. COMPARISON:  02/16/2019 FINDINGS: Brain: No evidence of acute infarction, hemorrhage, hydrocephalus, extra-axial collection or mass lesion/mass effect. Prominence of the sulci and ventricles compatible with brain atrophy. Unchanged appearance of 1.2 cm calcified lesion in the anterior inferior right cerebellum. Vascular: No hyperdense vessel or unexpected calcification. Skull: Normal. Negative for fracture or focal lesion. Sinuses/Orbits: Bilateral maxillary sinus mucosal thickening identified. Air-fluid level within the right maxillary sinus also noted. Mastoid air cells are clear. Other: None. IMPRESSION: 1. No acute intracranial abnormality. 2. Unchanged 1.3 cm calcified  lesion  in the right cerebellum. 3. Chronic brain atrophy. Electronically Signed   By: Signa Kell M.D.   On: 03/27/2019 20:01   Ct Angio Chest Pe W Or Wo Contrast  Result Date: 04/14/2019 CLINICAL DATA:  Increasing jaw pain, shortness of breath and cough, initial encounter EXAM: CT ANGIOGRAPHY CHEST WITH CONTRAST TECHNIQUE: Multidetector CT imaging of the chest was performed using the standard protocol during bolus administration of intravenous contrast. Multiplanar CT image reconstructions and MIPs were obtained to evaluate the vascular anatomy. CONTRAST:  OMNIPAQUE 350 COMPARISON:  04/14/2019, 01/26/2019 PET-CT FINDINGS: Cardiovascular: Thoracic aorta demonstrates atherosclerotic calcifications without aneurysmal dilatation. No cardiac enlargement is seen. Coronary calcifications are noted. The pulmonary artery shows a normal branching pattern. No filling defects to suggest pulmonary embolism are identified. Mediastinum/Nodes: Thoracic inlet demonstrates evidence of prior tracheostomy as well as some left cervical adenopathy similar to that seen on prior PET-CT. No hilar or mediastinal adenopathy is noted. The esophagus is within normal limits. Lungs/Pleura: Lungs demonstrate diffuse emphysematous changes. No focal mass lesion or sizable effusion is noted. No focal infiltrate is seen. Scarring is noted in the apices bilaterally. Upper Abdomen: Visualized upper abdomen shows no acute abnormality. Musculoskeletal: Degenerative changes of the cervical and thoracic spine are seen. Review of the MIP images confirms the above findings. IMPRESSION: No evidence of pulmonary emboli. Changes of prior tracheostomy. Left cervical lymphadenopathy stable from prior PET-CT Aortic Atherosclerosis (ICD10-I70.0) and Emphysema (ICD10-J43.9). Electronically Signed   By: Alcide Clever M.D.   On: 04/14/2019 22:17   Dg Chest Port 1 View  Result Date: 04/21/2019 CLINICAL DATA:  Syncope. EXAM: PORTABLE CHEST 1 VIEW  COMPARISON:  04/14/2019 FINDINGS: The heart size and mediastinal contours are within normal limits. Stable emphysematous lung disease. The lungs are hyperinflated bilaterally. There is no evidence of pulmonary edema, consolidation, pneumothorax, nodule or pleural fluid. The visualized skeletal structures are unremarkable. IMPRESSION: Emphysema and hyperinflation.  No acute findings. Electronically Signed   By: Irish Lack M.D.   On: 04/21/2019 16:37   Dg Chest Port 1 View  Result Date: 04/14/2019 CLINICAL DATA:  Cough EXAM: PORTABLE CHEST 1 VIEW COMPARISON:  February 16, 2019 FINDINGS: There is mild atelectatic change in the right mid lung. There is no edema or consolidation. Heart size and pulmonary vascularity are normal. No adenopathy. No bone lesions. IMPRESSION: Mild right midlung atelectasis. Lungs elsewhere clear. Stable cardiac silhouette. Electronically Signed   By: Bretta Bang III M.D.   On: 04/14/2019 14:11     Microbiology: Recent Results (from the past 240 hour(s))  Culture, blood (routine x 2)     Status: None (Preliminary result)   Collection Time: 04/21/19  4:22 PM  Result Value Ref Range Status   Specimen Description BLOOD LEFT WRIST  Final   Special Requests   Final    BOTTLES DRAWN AEROBIC ONLY Blood Culture results may not be optimal due to an inadequate volume of blood received in culture bottles   Culture   Final    NO GROWTH 3 DAYS Performed at [facility], [address] [place], [place] [address]    Report Status PENDING  Incomplete  Culture, blood (routine x 2)     Status: None (Preliminary result)   Collection Time: 04/21/19  4:22 PM  Result Value Ref Range Status   Specimen Description LEFT ANTECUBITAL  Final   Special Requests   Final    BOTTLES DRAWN AEROBIC AND ANAEROBIC Blood Culture adequate volume   Culture   Final  NO GROWTH 3 DAYS Performed at [facility], [address], [place], [place] [address]    Report Status PENDING  Incomplete    Urine culture     Status: None   Collection Time: 04/21/19  6:13 PM  Result Value Ref Range Status   Specimen Description   Final    URINE, CATHETERIZED Performed at [facility], [address] [place], [place] [address]    Special Requests   Final    NONE Performed at [facility], [address] [place], [place] [address]    Culture   Final    NO GROWTH Performed at [facility], [address], [place], [place] [address]    Report Status 04/23/2019 FINAL  Final     Labs: Basic Metabolic Panel: Recent Labs  Lab 04/21/19 1621 04/21/19 2225 04/22/19 0452 04/23/19 0618 04/25/19 0637  NA 129* 133* 135 135 134*  K 3.4* 3.6 4.3 3.5 3.7  CL 89* 95* 101 103 102  CO2 23 25 26 24 23   GLUCOSE 101* 101* 69* 149* 196*  BUN 21 16 14 15 16   CREATININE 0.90 0.75 0.60* 0.59* 0.64  CALCIUM 10.0 9.1 9.2 8.7* 8.4*  MG  --  2.3  --   --   --   PHOS  --  3.6  --  1.9*  --    Liver Function Tests: Recent Labs  Lab 04/20/19 1036 04/21/19 1621 04/22/19 0452 04/23/19 0618 04/25/19 0637  AST 33 35 29  --  60*  ALT 44 40 31  --  53*  ALKPHOS 110 100 81  --  85  BILITOT 0.5 0.4 0.5  --  0.3  PROT 7.5 8.2* 6.9  --  5.9*  ALBUMIN 4.4 4.2 3.5 3.2* 3.0*   No results for input(s): LIPASE, AMYLASE in the last 168 hours. No results for input(s): AMMONIA in the last 168 hours. CBC: Recent Labs  Lab 04/20/19 1036 04/21/19 1621 04/22/19 0452 04/25/19 0637  WBC 4.9 8.9 5.8 6.5  NEUTROABS 3.4 7.3 4.2  --   HGB 13.9 14.0 12.5* 12.2*  HCT 42.8 45.1 39.7 40.5  MCV 79 83.5 82.9 87.7  PLT 359 374 312 251   Cardiac Enzymes: Recent Labs  Lab 04/21/19 1621 04/25/19 0637  TROPONINI <0.03 <0.03   BNP: Invalid input(s): POCBNP CBG: Recent Labs  Lab 04/24/19 0618 04/24/19 1118 04/24/19 1801 04/25/19 0016 04/25/19 0516  GLUCAP 81 77 103* 94 108*    Time coordinating discharge:  36 minutes  Signed:  Catarina Hartshorn, DO [facility] Pager: [phone] 04/25/2019, 7:55  AM

## 2019-04-25 NOTE — Progress Notes (Signed)
Small open areas noted in gluteal folds.  Pt refused pink foam.

## 2019-04-25 NOTE — Consult Note (Signed)
Consultation Note Date: 04/25/2019   Patient Name: Brandon Hall  DOB: [birth year]  MRN: [id number]  Age / Sex: 63 y.o., male  PCP: Dettinger, Elige Radon, MD Referring Physician: Catarina Hartshorn, MD  Reason for Consultation: Establishing goals of care  HPI/[name]  with past medical history of invasive cancer of larynx cancer (s/p rad tx to neck, chemo- 2015) with recurrence of bulky mass and lymph node metastasis in 01/2019- started on Keytruda- last received treatment on 3/27. Recent admission for 4/16-4/17 for aspiration pnuemonia. 4/22 visit with his primary care physician notes discussion and recommendation for Hospice and durable DNR was completed. He was admitted on 04/21/2019 after having what appears to be frequently occurring incidence of becoming nonresponsive at home, workup revealed dehydration, malnutrition, hypothermia.He has a PEG tube and receives bolus feedings. Palliative medicine consulted for GOC.   Clinical Assessment and Goals of Care: Evaluated patient at bedside. Per chart review he has a caretaker/niece- Brandon Hall who participates in his care and helps him with communication.  Patient was sleeping, aroused easily. When attempting to communicate he made many hand gestures and was very friendly, but GOC discussion was not able to be had. I was able to reach his spouse- Brandon Hall - however she deferred all GOC discussion and decision making to patient's niece Brandon Hall. [name] did state that patient has not been doing well at home. He has significantly declined over the last year and much more in the last few months. She does have worries about him dying. We discussed Hospice referral and she is open to having Hospice support at home- however, she again defers this discussion to [name] and says, "do whatever [name] wants to do".  I called Brandon Hall and left a message and am awaiting return call.    Primary Decision Maker OTHER- unclear at this point- need to speak with niece Brandon Hall for further information- ie- is there HCPOA in place? Patient needs assistance with communication. Patient's spouse is deferring decision making to [facility]- patient appears able to make decisions- but would need communication assistance. He declined to write for me.     SUMMARY OF RECOMMENDATIONS -Would recommend [facility] referral- he has been followed in the past by [facility] and seen at home -Noted he was DNR and Hospice was discussed by his primary MD on 4/22 but I contacted there office and no referral had been made- I am attempting to contact the niece- Brandon Hall for further clarification as she was there with the patient at that visit- if this cannot be done before discharge- recommend outpatient Palliative referral    Code Status/Advance Care Planning:  Full code for now- but appears patient has been DNR in the past   Primary Diagnoses: Present on Admission: . Lactic acidosis . Dyslipidemia . Hypertension . Hyponatremia . Uncontrolled Hypothyroidism . Altered mental state . Hypoglycemia . Elevated TSH   I have reviewed the medical record, interviewed the patient and family, and examined the patient. The following aspects are pertinent.  Past Medical History:  Diagnosis Date  . Arthritis   . Blindness of right eye   . Cancer (HCC)    pharyngeal ca  . History of radiation therapy 11/2014   TxN3MO squamous cell carcinoma of L neck, Stage IVB, unknown primary 70 Gy in 35 fractions treated at [facility], finished 12/25/14  . History of shingles    RASH  02-03-2014  PER PCP RESOLVING  . Hypertension    NO MEDS ,  MONITORED BY PCP  . Hypothyroidism, postradioiodine therapy   . Nonverbal   . Prostate cancer (HCC) 07/12/2014   DX  SEVERAL  YRS   AGO  WITH RADIATION THERAPY  . Squamous cell carcinoma of head and neck (HCC) 07/12/2014  . 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 Marital status: Married    Spouse name: Not on file  . Number of children: Not on file  . Years of education: Not on file  . Highest education level: Not on file  Occupational History  . Occupation: disabled  Social Needs  . Financial resource strain: Not very hard  . Food insecurity:    Worry: Never true    Inability: Never true  . Transportation needs:    Medical: No    Non-medical: No  Tobacco Use  . Smoking status: Former Smoker    Packs/day: 1.00    Years: 40.00    Pack years: 40.00    Types: Cigarettes    Last attempt to quit: 09/29/2013    Years since quitting: 5.5  . Smokeless tobacco: Never Used  Substance and Sexual Activity  . Alcohol use: No  . Drug use: No  . Sexual activity: Not on file  Lifestyle  . Physical activity:    Days per week: 3 days    Minutes per session: 30 min  . Stress: Only a little  Relationships  . Social connections:    Talks on phone: Never    Gets together: More than three times a week    Attends religious service: More than 4 times per year    Active member of club or organization: Yes    Attends meetings of clubs or organizations: More than 4 times per year    Relationship status: Married  Other Topics Concern  . Not on file  Social History Narrative   Lives with wife in an apartment. He has stepchildren. They walk most everywhere they go locally but have access to transportation if needed.   Family History  Problem Relation Age of Onset  . Colon cancer Father   . Hypertension Mother   . Colon cancer Brother   . Colon cancer Brother    Scheduled Meds: . enoxaparin (LOVENOX) injection  40 mg Subcutaneous Q24H  . feeding supplement (OSMOLITE 1.5 CAL)  237 mL Per Tube 5 X Daily  . feeding supplement (PRO-STAT SUGAR FREE 64)  30 mL Per Tube Daily  . free water  300 mL Per Tube Q8H  . [START ON 04/26/2019] levothyroxine  150 mcg Per Tube Q0600  . nystatin  5 mL Oral QID  . pravastatin  40  mg Per Tube Daily   Continuous Infusions: . 0.9 % NaCl with KCl 40 mEq / L Stopped (04/25/19 1555)   PRN Meds:.albuterol, HYDROcodone-acetaminophen, magic mouthwash, ondansetron **OR** ondansetron (ZOFRAN) IV, polyethylene glycol, prochlorperazine   No Known Allergies Review of Systems  Physical Exam Vitals signs and nursing note reviewed.  Constitutional:  Comments: cachetic  Cardiovascular:     Rate and Rhythm: Normal rate.  Pulmonary:     Effort: Pulmonary effort is normal.  Neurological:     Mental Status: He is alert.     Vital Signs: BP 103/64 (BP Location: Left Arm)   Pulse (!) 104   Temp (!) 97.5 F (36.4 C) (Oral)   Resp 17   Ht 6\' 1"  (1.854 m)   Wt 51.4 kg   SpO2 95%   BMI 14.96 kg/m  Pain Scale: 0-10   Pain Score: 0-No pain   SpO2: SpO2: 95 % O2 Device:SpO2: 95 % O2 Flow Rate: .   IO: Intake/output summary:   Intake/Output Summary (Last 24 hours) at 04/25/2019 1610 Last data filed at 04/25/2019 1413 Gross per 24 hour  Intake 3764.92 ml  Output -  Net 3764.92 ml    LBM: Last BM Date: 04/21/19 Baseline Weight: Weight: 54 kg Most recent weight: Weight: 51.4 kg     Palliative Assessment/Data: PPS: 50%     Thank you for this consult. Palliative medicine will continue to follow and assist as needed.   Time In: 1500 Time Out: 1615 Time Total: 75 mins Greater than 50%  of this time was spent counseling and coordinating care related to the above assessment and plan.  Signed by: [name], AGNP-C Palliative Medicine    Please contact Palliative Medicine Team phone at [phone] for questions and concerns.  For individual provider: See Loretha Stapler

## 2019-04-26 DIAGNOSIS — Z515 Encounter for palliative care: Secondary | ICD-10-CM

## 2019-04-26 LAB — CBC WITH DIFFERENTIAL/PLATELET
Abs Immature Granulocytes: 0.02 10*3/uL (ref 0.00–0.07)
Basophils Absolute: 0 10*3/uL (ref 0.0–0.1)
Basophils Relative: 1 %
Eosinophils Absolute: 0.1 10*3/uL (ref 0.0–0.5)
Eosinophils Relative: 1 %
HCT: 38.7 % — ABNORMAL LOW (ref 39.0–52.0)
Hemoglobin: 11.7 g/dL — ABNORMAL LOW (ref 13.0–17.0)
Immature Granulocytes: 0 %
Lymphocytes Relative: 16 %
Lymphs Abs: 0.9 10*3/uL (ref 0.7–4.0)
MCH: 25.8 pg — ABNORMAL LOW (ref 26.0–34.0)
MCHC: 30.2 g/dL (ref 30.0–36.0)
MCV: 85.4 fL (ref 80.0–100.0)
Monocytes Absolute: 0.4 10*3/uL (ref 0.1–1.0)
Monocytes Relative: 7 %
Neutro Abs: 4.1 10*3/uL (ref 1.7–7.7)
Neutrophils Relative %: 75 %
Platelets: 308 10*3/uL (ref 150–400)
RBC: 4.53 MIL/uL (ref 4.22–5.81)
RDW: 14.4 % (ref 11.5–15.5)
WBC: 5.5 10*3/uL (ref 4.0–10.5)
nRBC: 0 % (ref 0.0–0.2)

## 2019-04-26 LAB — BASIC METABOLIC PANEL WITH GFR
Anion gap: 8 (ref 5–15)
BUN: 11 mg/dL (ref 8–23)
CO2: 25 mmol/L (ref 22–32)
Calcium: 8.5 mg/dL — ABNORMAL LOW (ref 8.9–10.3)
Chloride: 101 mmol/L (ref 98–111)
Creatinine, Ser: 0.54 mg/dL — ABNORMAL LOW (ref 0.61–1.24)
GFR calc Af Amer: >60 mL/min (ref >60)
GFR calc non Af Amer: >60 mL/min (ref >60)
Glucose, Bld: 186 mg/dL — ABNORMAL HIGH (ref 70–99)
Potassium: 3.8 mmol/L (ref 3.5–5.1)
Sodium: 134 mmol/L — ABNORMAL LOW (ref 135–145)

## 2019-04-26 LAB — CULTURE, BLOOD (ROUTINE X 2)
Culture: NO GROWTH
Culture: NO GROWTH
Special Requests: ADEQUATE

## 2019-04-26 LAB — GLUCOSE, CAPILLARY
Glucose-Capillary: 116 mg/dL — ABNORMAL HIGH (ref 70–99)
Glucose-Capillary: 69 mg/dL — ABNORMAL LOW (ref 70–99)
Glucose-Capillary: 74 mg/dL (ref 70–99)
Glucose-Capillary: 95 mg/dL (ref 70–99)

## 2019-04-26 LAB — D-DIMER, QUANTITATIVE: D-Dimer, Quant: 0.7 ug{FEU}/mL — ABNORMAL HIGH (ref 0.00–0.50)

## 2019-04-26 MED ORDER — METOPROLOL TARTRATE 25 MG PO TABS: 12.5000 mg | ORAL_TABLET | Freq: Two times a day (BID) | ORAL | 1 refills | Status: AC

## 2019-04-26 MED ORDER — FREE WATER: 500.0000 mL | Freq: Three times a day (TID) | Status: AC

## 2019-04-26 MED ORDER — SODIUM CHLORIDE 0.9 % IV BOLUS
1000.0000 mL | Freq: Once | INTRAVENOUS | Status: AC
  Administered 2019-04-26: 1000 mL via INTRAVENOUS

## 2019-04-26 MED ORDER — METOPROLOL TARTRATE 25 MG PO TABS
12.5000 mg | ORAL_TABLET | Freq: Two times a day (BID) | ORAL | Status: DC
  Administered 2019-04-26: 12.5 mg via ORAL
  Filled 2019-04-26: qty 1

## 2019-04-26 NOTE — TOC Transition Note (Signed)
Transition of Care [facility]) - CM/SW Discharge Note   Patient Details  Name: Brandon Hall MRN: [id number] Date of Birth: 1956-05-28  [facility] CM/SW Contact:  Ihor Gully, LCSW Phone Number: 04/26/2019, 4:03 PM   Clinical Narrative:       Final next level of care: Home w Home Health Services Barriers to Discharge: No Barriers Identified   Patient Goals and CMS Choice Patient states their goals for this hospitalization and ongoing recovery are:: Family plans for patient to return home with additional DME. CMS Medicare.gov Compare Post Acute Care list provided to:: Other (Comment Required)([facility])    Discharge Placement                Patient to be transferred to facility by: [facility] to take home. Name of family member notified: Niece, Kerin Salen.  Patient and family notified of of transfer: 04/26/19  Discharge Plan and Services                DME Arranged: Wheelchair manual DME Agency: [employer] Date DME Agency Contacted: 04/25/19 Time DME Agency Contacted: [id number] Representative spoke with at DME Agency: Juliann Pulse HH Arranged: RN, PT, Nurse's Aide [place] Agency: [place] ([place]) Date [place]: 04/25/19 Time [place]: 1432 Representative spoke with at [place]: Vaughan Basta (patient is active)  Social Determinants of Health (SDOH) Interventions     Readmission Risk Interventions Readmission Risk Prevention Plan 04/15/2019  Transportation Screening Complete  PCP or Specialist Appt within 3-5 Days Complete  HRI or [place] Complete  Social Work Consult for [place] Planning/Counseling Complete  Palliative Care Screening Not Applicable  Medication Review Press photographer) Complete  Some recent data might be hidden    LCSW spoke with Caryl Pina, Therapist, sports and advised that per request of niece, Brayton Layman, to put patient's name and her name on w/c delivered to room and she would pick up tomorrow.

## 2019-04-26 NOTE — Plan of Care (Signed)

## 2019-04-26 NOTE — Discharge Summary (Signed)
Physician Discharge Summary  Brandon Hall QIH:[id number] DOB: 01/24/1956 DOA: 04/21/2019  PCP: Dettinger, Elige Radon, MD  Admit date: 04/21/2019 Discharge date: 04/26/2019  Admitted From: Home Disposition:  Home  Recommendations for Outpatient Follow-up:  1. Follow up with PCP in 1-2 weeks   Home Health: YES Equipment/Devices: HHPT Discharge Condition: Stable CODE STATUS: DNR Diet recommendation: Osmolite 1.5, one can 5X daily Free water 500cc three times daily   Brief/Interim Summary: 63 year old male with a history of Graves' disease, hypertension, prostate cancer, recurrent laryngeal carcinoma status post chemo radiation presented with altered mental status.  Apparently, the patient had slid out of his recliner and stiffened up without any reported seizure activity.  EMS states pt initially could not stand because he "was shaky" but then was able to stand. Pt is non-verbal per baseline and gestures his needs (ie: pointed to his chair when EMS was in his home). EMS states they have made several call outs to pt's home to help pt up. No reported fevers, cough, vomiting/diarrhea, focal motor weakness.  According to the medical record, the patient has had a history of being overmedicated with opioid medications.  Upon presentation, the patient was noted to have lactic acid 3.7.  He was fluid resuscitated.  CT of the brain was negative for metastatic disease.  EEG was negative for epileptiform discharges.  The patient recently had hospital admission from 04/14/2019 through 04/15/2019 for aspiration pneumonitis.  He is finished a home course of Augmentin.  Upon presentation, the patient was noted to be hypothermic and hypoglycemic.  A.m. cortisol was 21.4.  The patient was fluid resuscitated with improvement of his mental status.  In the early a.m. 04/25/2019, there was concern about possible hypotension with a blood pressure 65/54.  The patient was given 1 L bolus.  However, upon evaluation the patient  was asymptomatic and appeared nontoxic.  Repeat vital signs showed heart rate 86 with blood pressure 139/76 with oxygen saturation 100% room air.  During the hospitalization, the patient's mental status would improve when he was fluid bolused and resuscitated.  On 04/25/19, he was given additional fluid boluses for an elevated lactic acid.  He remained afebrile and hemodynamically stable.  Pt was noted to be tachycardic in low to mid 100s.  Although D-Dimer was elevated, he had recent CTA chest on 04/14/19 which was negative for PE and he remained 100% on RA and denied any chest discomfort. Patient was started on metoprolol 12.5 mg bid. Palliative medicine was consulted.  After speaking with patient and his primary advocate who was the patient's niece [name]), the patient was transitioned to DNR.  He did not want to pursue further chemo/immunotherapy.  Blood and urine cultures were negative. CXR showed hyperinflation without infiltrates.  His free water flushes were increased to 500cc q 8 hours and enteral feedings were increased to 5X daily from 4X daily.   Discharge Diagnoses:  Acute metabolic encephalopathy -Likely combination of overmedication with opioids as well as dehydration -Mental status back to baseline -In retrospect given the patient's presentation and constellation of symptoms the patient likely had dehydration with poor intake -resolved   Lactic acidosis -Secondary to volume depletion -Improved with fluid resuscitation -Blood cultures negative -pt remains afebrile and hemodynamically stable -Urinalysis negative for pyuria, but did show ketonuria  Hypoglycemia -A.m. 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 -Patient currently on Keytruda--last cycle on 03/04/2019 -Follow-up at [facility]  Hypothyroidism -TSH 32.96 -  Free T4 1.16 -Suspect the patient has not been receiving or getting his  levothyroxine properly versus euthyroid sick syndrome -Continue current dose of Synthroid -Repeat thyroid function tests in 3 to 4 weeks -On 03/25/2019, his Synthroid dose was increased to 150 mcg -Patient has poor health literacy, and does not know what dose he is actually receiving  Goals of Care -palliative medicine consulted -they spoke with niece, [name], who was patient's primary advocate -pt changed to DNR -he did not want to pursue further chemo or radiation   Discharge Instructions   Allergies as of 04/26/2019   No Known Allergies     Medication List    STOP taking these medications   amoxicillin-clavulanate 250-62.5 MG/5ML suspension Commonly known as:  AUGMENTIN     TAKE these medications   feeding supplement (OSMOLITE 1.5 CAL) Liqd Place 237 mLs into feeding tube 5 (five) times daily. What changed:  when to take this   free water Soln Place 500 mLs into feeding tube every 8 (eight) hours. What changed:  how much to take   HYDROcodone-acetaminophen 7.5-325 mg/15 ml solution Commonly known as:  HYCET TAKE (2) TEASPOONFULS EVERY FOUR HOURS AS NEEDED FOR PAIN. What changed:  See the new instructions.   KEYTRUDA IV Inject 200 mg into the vein every 21 ( twenty-one) days.   levothyroxine 150 MCG tablet Commonly known as:  SYNTHROID Place 1 tablet (150 mcg total) into feeding tube daily at 6 (six) AM. What changed:    medication strength  how much to take  when to take this   magic mouthwash Soln Take 5 mLs by mouth 3 (three) times daily as needed for mouth pain.   metoprolol tartrate 25 MG tablet Commonly known as:  LOPRESSOR Take 0.5 tablets (12.5 mg total) by mouth 2 (two) times daily.   nystatin 100000 UNIT/ML suspension Commonly known as:  MYCOSTATIN Take 5 mLs (500,000 Units total) by mouth 4 (four) times daily.   polyethylene glycol 17 g packet Commonly known as:  MIRALAX / GLYCOLAX Take 17 g by mouth daily as needed for mild constipation  or moderate constipation.   pravastatin 40 MG tablet Commonly known as:  PRAVACHOL Take 1 tablet (40 mg total) by mouth daily. What changed:  how to take this   prochlorperazine 10 MG tablet Commonly known as:  COMPAZINE Take 1 tablet (10 mg total) by mouth every 6 (six) hours as needed (Nausea or vomiting). What changed:    how much to take  how to take this  reasons to take this   Vitamin D (Ergocalciferol) 1.25 MG (50000 UT) Caps capsule Commonly known as:  DRISDOL TAKE 1 CAPSULE EVERY 7 DAYS. What changed:  See the new instructions.            Durable Medical Equipment  (From admission, onward)         Start     Ordered   04/25/19 1445  For home use only DME standard manual wheelchair with seat cushion  Once    Comments:  Patient suffers from laryngeal carcinoma, deconditioning, gait instability which impairs their ability to perform daily activities like walking in the home.  A cane will not resolve  issue with performing activities of daily living. A wheelchair will allow patient to safely perform daily activities. Patient can safely propel the wheelchair in the home or has a caregiver who can provide assistance.  Accessories: elevating leg rests (ELRs), wheel locks, extensions and anti-tippers.   04/25/19 1444  04/25/19 1444  For home use only DME Bedside commode  Once    Question:  Patient needs a bedside commode to treat with the following condition  Answer:  Gait instability   04/25/19 1444          No Known Allergies  Consultations:  Palliative medicine   Procedures/Studies: Ct Head Wo Contrast  Result Date: 04/21/2019 CLINICAL DATA:  Encephalopathy EXAM: CT HEAD WITHOUT CONTRAST TECHNIQUE: Contiguous axial images were obtained from the base of the skull through the vertex without intravenous contrast. COMPARISON:  04/14/2019 FINDINGS: Brain: No acute intracranial abnormality. Specifically, no hemorrhage, hydrocephalus, mass lesion, acute  infarction, or significant intracranial injury. Calcification in the inferior right cerebellum, stable Vascular: No hyperdense vessel or unexpected calcification. Skull: No acute calvarial abnormality. Sinuses/Orbits: Mucosal thickening throughout the paranasal sinuses. Debris noted in the nasopharynx, stable since prior study. Other: None IMPRESSION: No acute intracranial abnormality. Electronically Signed   By: Charlett Nose M.D.   On: 04/21/2019 21:46   Ct Head Wo Contrast  Result Date: 04/14/2019 CLINICAL DATA:  63 y/o M; Altered level of consciousness (LOC), unexplained. EXAM: CT HEAD WITHOUT CONTRAST TECHNIQUE: Contiguous axial images were obtained from the base of the skull through the vertex without intravenous contrast. COMPARISON:  03/27/2019 CT head. FINDINGS: Brain: No evidence of acute infarction, hemorrhage, hydrocephalus, extra-axial collection or mass effect. Stable nonspecific white matter hypodensities compatible with mild chronic microvascular ischemic changes and stable volume loss of the brain. Stable levin mm calcified lesion within the right inferior cerebellar hemisphere. Vascular: Calcific atherosclerosis of the carotid siphons. No hyperdense vessel identified. Skull: Normal. Negative for fracture or focal lesion. Sinuses/Orbits: Diffuse paranasal sinus mucosal thickening, right maxillary sinus fluid level, and debris within the nasopharynx, similar to prior CT head. Normal aeration of mastoid air cells. Hyperdense right globe. Normal left orbit. Other: None. IMPRESSION: 1. No acute intracranial abnormality identified. 2. Stable mild chronic microvascular ischemic changes and parenchymal volume loss of the brain. 3. Stable right inferior cerebellar hemisphere calcified lesion. Electronically Signed   By: Mitzi Hansen M.D.   On: 04/14/2019 20:15   Ct Head Wo Contrast  Result Date: 03/27/2019 CLINICAL DATA:  Questionable seizure. Abnormal neurologic exam. Nontraumatic. EXAM:  CT HEAD WITHOUT CONTRAST TECHNIQUE: Contiguous axial images were obtained from the base of the skull through the vertex without intravenous contrast. COMPARISON:  02/16/2019 FINDINGS: Brain: No evidence of acute infarction, hemorrhage, hydrocephalus, extra-axial collection or mass lesion/mass effect. Prominence of the sulci and ventricles compatible with brain atrophy. Unchanged appearance of 1.2 cm calcified lesion in the anterior inferior right cerebellum. Vascular: No hyperdense vessel or unexpected calcification. Skull: Normal. Negative for fracture or focal lesion. Sinuses/Orbits: Bilateral maxillary sinus mucosal thickening identified. Air-fluid level within the right maxillary sinus also noted. Mastoid air cells are clear. Other: None. IMPRESSION: 1. No acute intracranial abnormality. 2. Unchanged 1.3 cm calcified lesion in the right cerebellum. 3. Chronic brain atrophy. Electronically Signed   By: Signa Kell M.D.   On: 03/27/2019 20:01   Ct Angio Chest Pe W Or Wo Contrast  Result Date: 04/14/2019 CLINICAL DATA:  Increasing jaw pain, shortness of breath and cough, initial encounter EXAM: CT ANGIOGRAPHY CHEST WITH CONTRAST TECHNIQUE: Multidetector CT imaging of the chest was performed using the standard protocol during bolus administration of intravenous contrast. Multiplanar CT image reconstructions and MIPs were obtained to evaluate the vascular anatomy. CONTRAST:  OMNIPAQUE 350 COMPARISON:  04/14/2019, 01/26/2019 PET-CT FINDINGS: Cardiovascular: Thoracic aorta demonstrates atherosclerotic calcifications without  aneurysmal dilatation. No cardiac enlargement is seen. Coronary calcifications are noted. The pulmonary artery shows a normal branching pattern. No filling defects to suggest pulmonary embolism are identified. Mediastinum/Nodes: Thoracic inlet demonstrates evidence of prior tracheostomy as well as some left cervical adenopathy similar to that seen on prior PET-CT. No hilar or  mediastinal adenopathy is noted. The esophagus is within normal limits. Lungs/Pleura: Lungs demonstrate diffuse emphysematous changes. No focal mass lesion or sizable effusion is noted. No focal infiltrate is seen. Scarring is noted in the apices bilaterally. Upper Abdomen: Visualized upper abdomen shows no acute abnormality. Musculoskeletal: Degenerative changes of the cervical and thoracic spine are seen. Review of the MIP images confirms the above findings. IMPRESSION: No evidence of pulmonary emboli. Changes of prior tracheostomy. Left cervical lymphadenopathy stable from prior PET-CT Aortic Atherosclerosis (ICD10-I70.0) and Emphysema (ICD10-J43.9). Electronically Signed   By: Alcide Clever M.D.   On: 04/14/2019 22:17   Dg Chest Port 1 View  Result Date: 04/21/2019 CLINICAL DATA:  Syncope. EXAM: PORTABLE CHEST 1 VIEW COMPARISON:  04/14/2019 FINDINGS: The heart size and mediastinal contours are within normal limits. Stable emphysematous lung disease. The lungs are hyperinflated bilaterally. There is no evidence of pulmonary edema, consolidation, pneumothorax, nodule or pleural fluid. The visualized skeletal structures are unremarkable. IMPRESSION: Emphysema and hyperinflation.  No acute findings. Electronically Signed   By: Irish Lack M.D.   On: 04/21/2019 16:37   Dg Chest Port 1 View  Result Date: 04/14/2019 CLINICAL DATA:  Cough EXAM: PORTABLE CHEST 1 VIEW COMPARISON:  February 16, 2019 FINDINGS: There is mild atelectatic change in the right mid lung. There is no edema or consolidation. Heart size and pulmonary vascularity are normal. No adenopathy. No bone lesions. IMPRESSION: Mild right midlung atelectasis. Lungs elsewhere clear. Stable cardiac silhouette. Electronically Signed   By: Bretta Bang III M.D.   On: 04/14/2019 14:11        Discharge Exam: Vitals:   04/26/19 0509 04/26/19 0802  BP: (!) 119/97   Pulse: (!) 131   Resp: 16   Temp: (!) 97.5 F (36.4 C)   SpO2: 99% 100%     Vitals:   04/25/19 2016 04/25/19 2145 04/26/19 0509 04/26/19 0802  BP:  130/71 (!) 119/97   Pulse:  95 (!) 131   Resp:  16 16   Temp:  98.6 F (37 C) (!) 97.5 F (36.4 C)   TempSrc:  Oral Oral   SpO2: 95% 99% 99% 100%  Weight:      Height:        General: Pt is alert, awake, not in acute distress Cardiovascular: RRR, S1/S2 +, no rubs, no gallops Respiratory: CTA bilaterally, no wheezing, no rhonchi Abdominal: Soft, NT, ND, bowel sounds + Extremities: no edema, no cyanosis   The results of significant diagnostics from this hospitalization (including imaging, microbiology, ancillary and laboratory) are listed below for reference.    Significant Diagnostic Studies: Ct Head Wo Contrast  Result Date: 04/21/2019 CLINICAL DATA:  Encephalopathy EXAM: CT HEAD WITHOUT CONTRAST TECHNIQUE: Contiguous axial images were obtained from the base of the skull through the vertex without intravenous contrast. COMPARISON:  04/14/2019 FINDINGS: Brain: No acute intracranial abnormality. Specifically, no hemorrhage, hydrocephalus, mass lesion, acute infarction, or significant intracranial injury. Calcification in the inferior right cerebellum, stable Vascular: No hyperdense vessel or unexpected calcification. Skull: No acute calvarial abnormality. Sinuses/Orbits: Mucosal thickening throughout the paranasal sinuses. Debris noted in the nasopharynx, stable since prior study. Other: None IMPRESSION: No acute intracranial abnormality. Electronically  Signed   By: Charlett Nose M.D.   On: 04/21/2019 21:46   Ct Head Wo Contrast  Result Date: 04/14/2019 CLINICAL DATA:  63 y/o M; Altered level of consciousness (LOC), unexplained. EXAM: CT HEAD WITHOUT CONTRAST TECHNIQUE: Contiguous axial images were obtained from the base of the skull through the vertex without intravenous contrast. COMPARISON:  03/27/2019 CT head. FINDINGS: Brain: No evidence of acute infarction, hemorrhage, hydrocephalus, extra-axial collection or  mass effect. Stable nonspecific white matter hypodensities compatible with mild chronic microvascular ischemic changes and stable volume loss of the brain. Stable levin mm calcified lesion within the right inferior cerebellar hemisphere. Vascular: Calcific atherosclerosis of the carotid siphons. No hyperdense vessel identified. Skull: Normal. Negative for fracture or focal lesion. Sinuses/Orbits: Diffuse paranasal sinus mucosal thickening, right maxillary sinus fluid level, and debris within the nasopharynx, similar to prior CT head. Normal aeration of mastoid air cells. Hyperdense right globe. Normal left orbit. Other: None. IMPRESSION: 1. No acute intracranial abnormality identified. 2. Stable mild chronic microvascular ischemic changes and parenchymal volume loss of the brain. 3. Stable right inferior cerebellar hemisphere calcified lesion. Electronically Signed   By: Mitzi Hansen M.D.   On: 04/14/2019 20:15   Ct Head Wo Contrast  Result Date: 03/27/2019 CLINICAL DATA:  Questionable seizure. Abnormal neurologic exam. Nontraumatic. EXAM: CT HEAD WITHOUT CONTRAST TECHNIQUE: Contiguous axial images were obtained from the base of the skull through the vertex without intravenous contrast. COMPARISON:  02/16/2019 FINDINGS: Brain: No evidence of acute infarction, hemorrhage, hydrocephalus, extra-axial collection or mass lesion/mass effect. Prominence of the sulci and ventricles compatible with brain atrophy. Unchanged appearance of 1.2 cm calcified lesion in the anterior inferior right cerebellum. Vascular: No hyperdense vessel or unexpected calcification. Skull: Normal. Negative for fracture or focal lesion. Sinuses/Orbits: Bilateral maxillary sinus mucosal thickening identified. Air-fluid level within the right maxillary sinus also noted. Mastoid air cells are clear. Other: None. IMPRESSION: 1. No acute intracranial abnormality. 2. Unchanged 1.3 cm calcified lesion in the right cerebellum. 3. Chronic  brain atrophy. Electronically Signed   By: Signa Kell M.D.   On: 03/27/2019 20:01   Ct Angio Chest Pe W Or Wo Contrast  Result Date: 04/14/2019 CLINICAL DATA:  Increasing jaw pain, shortness of breath and cough, initial encounter EXAM: CT ANGIOGRAPHY CHEST WITH CONTRAST TECHNIQUE: Multidetector CT imaging of the chest was performed using the standard protocol during bolus administration of intravenous contrast. Multiplanar CT image reconstructions and MIPs were obtained to evaluate the vascular anatomy. CONTRAST:  OMNIPAQUE 350 COMPARISON:  04/14/2019, 01/26/2019 PET-CT FINDINGS: Cardiovascular: Thoracic aorta demonstrates atherosclerotic calcifications without aneurysmal dilatation. No cardiac enlargement is seen. Coronary calcifications are noted. The pulmonary artery shows a normal branching pattern. No filling defects to suggest pulmonary embolism are identified. Mediastinum/Nodes: Thoracic inlet demonstrates evidence of prior tracheostomy as well as some left cervical adenopathy similar to that seen on prior PET-CT. No hilar or mediastinal adenopathy is noted. The esophagus is within normal limits. Lungs/Pleura: Lungs demonstrate diffuse emphysematous changes. No focal mass lesion or sizable effusion is noted. No focal infiltrate is seen. Scarring is noted in the apices bilaterally. Upper Abdomen: Visualized upper abdomen shows no acute abnormality. Musculoskeletal: Degenerative changes of the cervical and thoracic spine are seen. Review of the MIP images confirms the above findings. IMPRESSION: No evidence of pulmonary emboli. Changes of prior tracheostomy. Left cervical lymphadenopathy stable from prior PET-CT Aortic Atherosclerosis (ICD10-I70.0) and Emphysema (ICD10-J43.9). Electronically Signed   By: Alcide Clever M.D.   On: 04/14/2019 22:17  Dg Chest Port 1 View  Result Date: 04/21/2019 CLINICAL DATA:  Syncope. EXAM: PORTABLE CHEST 1 VIEW COMPARISON:  04/14/2019 FINDINGS: The heart size  and mediastinal contours are within normal limits. Stable emphysematous lung disease. The lungs are hyperinflated bilaterally. There is no evidence of pulmonary edema, consolidation, pneumothorax, nodule or pleural fluid. The visualized skeletal structures are unremarkable. IMPRESSION: Emphysema and hyperinflation.  No acute findings. Electronically Signed   By: Irish Lack M.D.   On: 04/21/2019 16:37   Dg Chest Port 1 View  Result Date: 04/14/2019 CLINICAL DATA:  Cough EXAM: PORTABLE CHEST 1 VIEW COMPARISON:  February 16, 2019 FINDINGS: There is mild atelectatic change in the right mid lung. There is no edema or consolidation. Heart size and pulmonary vascularity are normal. No adenopathy. No bone lesions. IMPRESSION: Mild right midlung atelectasis. Lungs elsewhere clear. Stable cardiac silhouette. Electronically Signed   By: Bretta Bang III M.D.   On: 04/14/2019 14:11     Microbiology: Recent Results (from the past 240 hour(s))  Culture, blood (routine x 2)     Status: None   Collection Time: 04/21/19  4:22 PM  Result Value Ref Range Status   Specimen Description BLOOD LEFT WRIST  Final   Special Requests   Final    BOTTLES DRAWN AEROBIC ONLY Blood Culture results may not be optimal due to an inadequate volume of blood received in culture bottles   Culture   Final    NO GROWTH 5 DAYS Performed at [facility], [address] [place], [place] [address]    Report Status 04/26/2019 FINAL  Final  Culture, blood (routine x 2)     Status: None   Collection Time: 04/21/19  4:22 PM  Result Value Ref Range Status   Specimen Description LEFT ANTECUBITAL  Final   Special Requests   Final    BOTTLES DRAWN AEROBIC AND ANAEROBIC Blood Culture adequate volume   Culture   Final    NO GROWTH 5 DAYS Performed at [facility], [address], [place], [place] [address]    Report Status 04/26/2019 FINAL  Final  Urine culture     Status: None   Collection Time: 04/21/19  6:13 PM  Result Value  Ref Range Status   Specimen Description   Final    URINE, CATHETERIZED Performed at [facility], [address] [place], [place] [address]    Special Requests   Final    NONE Performed at [facility], [address], [place], [place] [address]    Culture   Final    NO GROWTH Performed at [facility], [address] [place], [place] [address]    Report Status 04/23/2019 FINAL  Final     Labs: Basic Metabolic Panel: Recent Labs  Lab 04/21/19 2225 04/22/19 0452 04/23/19 0618 04/25/19 0637 04/26/19 0615  NA 133* 135 135 134* 134*  K 3.6 4.3 3.5 3.7 3.8  CL 95* 101 103 102 101  CO2 25 26 24 23 25   GLUCOSE 101* 69* 149* 196* 186*  BUN 16 14 15 16 11   CREATININE 0.75 0.60* 0.59* 0.64 0.54*  CALCIUM 9.1 9.2 8.7* 8.4* 8.5*  MG 2.3  --   --   --   --   PHOS 3.6  --  1.9*  --   --    Liver Function Tests: Recent Labs  Lab 04/20/19 1036 04/21/19 1621 04/22/19 0452 04/23/19 0618 04/25/19 0637  AST 33 35 29  --  60*  ALT 44 40 31  --  53*  ALKPHOS 110 100 81  --  85  BILITOT 0.5 0.4 0.5  --  0.3  PROT 7.5 8.2* 6.9  --  5.9*  ALBUMIN 4.4 4.2 3.5 3.2* 3.0*   No results for input(s): LIPASE, AMYLASE in the last 168 hours. No results for input(s): AMMONIA in the last 168 hours. CBC: Recent Labs  Lab 04/20/19 1036 04/21/19 1621 04/22/19 0452 04/25/19 0637 04/26/19 0615  WBC 4.9 8.9 5.8 6.5 5.5  NEUTROABS 3.4 7.3 4.2  --  4.1  HGB 13.9 14.0 12.5* 12.2* 11.7*  HCT 42.8 45.1 39.7 40.5 38.7*  MCV 79 83.5 82.9 87.7 85.4  PLT 359 374 312 251 308   Cardiac Enzymes: Recent Labs  Lab 04/21/19 1621 04/25/19 0637  TROPONINI <0.03 <0.03   BNP: Invalid input(s): POCBNP CBG: Recent Labs  Lab 04/25/19 1312 04/25/19 1927 04/26/19 0018 04/26/19 0051 04/26/19 1144  GLUCAP 119* 109* 74 95 116*    Time coordinating discharge:  36 minutes  Signed:  Catarina Hartshorn, DO [facility] Pager: [phone] 04/26/2019, 1:13 PM

## 2019-04-26 NOTE — Progress Notes (Signed)
PT Cancellation Note  Patient Details Name: Brandon Hall MRN: [id number] DOB: Nov 27, 1956   Cancelled Treatment:    Reason Eval/Treat Not Completed: Patient declined, no reason specified Multiple attempts made to complete PT session.  Pt initially trying to nap then declined without a reason.  Pt's sitter stated he is excited for discharge later today.    [address], LPTA; [place]  Aldona Lento 04/26/2019, 2:34 PM

## 2019-04-26 NOTE — Progress Notes (Signed)
Daily Progress Note   Patient Name: Brandon Hall       Date: 04/26/2019 DOB: 18-Jul-1956  Age: 63 y.o. MRN#: [id number] Attending Physician: Catarina Hartshorn, MD Primary Care Physician: Dettinger, Elige Radon, MD Admit Date: 04/21/2019  Reason for Consultation/Follow-up: Establishing goals of care  Subjective: Patient sleeping in bed. Did not arouse.  [name] called and I was able to speak with her. She stated she received a call from Hospice yesterday but she had not called them back because "patient was in the hospital".  [name] stated that at the appointment with Dr. Louanne Skye they did discuss and agree to Hospice and DNR status. She said patient would not want to be resuscitated and would want to go home with Hospice support.  I confirmed with [name] if she and patient and patient's spouse understood philosophy of Hospice and she stated that they did- that [name].    Length of Stay: 4  Current Medications: Scheduled Meds:   enoxaparin (LOVENOX) injection  40 mg Subcutaneous Q24H   feeding supplement (OSMOLITE 1.5 CAL)  237 mL Per Tube 5 X Daily   feeding supplement (PRO-STAT SUGAR FREE 64)  30 mL Per Tube Daily   free water  300 mL Per Tube Q8H   levothyroxine  150 mcg Per Tube Q0600   metoprolol tartrate  12.5 mg Oral BID   nystatin  5 mL Oral QID   pravastatin  40 mg Per Tube Daily    Continuous Infusions:  0.9 % NaCl with KCl 40 mEq / L 75 mL/hr (04/25/19 1757)    PRN Meds: albuterol, HYDROcodone-acetaminophen, magic mouthwash, ondansetron **OR** ondansetron (ZOFRAN) IV, polyethylene glycol, prochlorperazine  Physical Exam          Vital Signs: BP (!) 119/97 (BP Location: Left Arm)    Pulse (!) 131    Temp (!) 97.5 F (36.4 C)  (Oral)    Resp 16    Ht 6\' 1"  (1.854 m)    Wt 51.4 kg    SpO2 100%    BMI 14.96 kg/m  SpO2: SpO2: 100 % O2 Device: O2 Device: Room Air O2 Flow Rate:    Intake/output summary:   Intake/Output Summary (Last 24 hours) at 04/26/2019 1412 Last data filed at 04/26/2019 0400 Gross per 24 hour  Intake 2777.75 ml  Output 350 ml  Net 2427.75 ml   LBM: Last BM Date: 04/25/19 Baseline Weight: Weight: 54 kg Most recent weight: Weight: 51.4 kg       Palliative Assessment/Data: PPS: 20%    Flowsheet Rows     Most Recent Value  Intake Tab  Referral Department  Hospitalist  Unit at Time of Referral  Med/Surg Unit  Palliative Care Primary Diagnosis  Cancer  Date Notified  04/23/19  Palliative Care Type  New Palliative care  Reason for referral  Clarify Goals of Care  Date of Admission  04/21/19  Date first seen by Palliative Care  04/25/19  # of days Palliative referral response time  2 Day(s)  # of days IP prior to Palliative referral  2  Clinical Assessment  Psychosocial & Spiritual Assessment  Palliative Care Outcomes      Patient Active Problem List   Diagnosis Date Noted   Acute encephalopathy 04/25/2019   Advanced care planning/counseling discussion    Altered mental state 04/22/2019   Hypoglycemia 04/22/2019   Elevated TSH 04/22/2019   Hyponatremia    Lactic acidosis    Vomiting in adult    Dehydration    Aspiration pneumonia of right middle lobe due to vomit (HCC)    Tachycardia 04/14/2019   Goals of care, counseling/discussion 01/31/2019   DNR (do not resuscitate) 01/11/2019   Cachexia (HCC) 01/11/2019   Lung nodule 01/11/2019   Status post laryngectomy 09/06/2018   Severe protein-calorie malnutrition (HCC) 08/20/2018   Laryngeal cancer (HCC) 08/19/2018   Perianal abscess 08/10/2017   Vitamin D deficiency 02/08/2015   Squamous cell carcinoma of head and neck (HCC) 07/12/2014   History of prostate cancer 07/12/2014   Shingles rash  02/03/2014   Abnormal transaminases 10/02/2013   Uncontrolled Hypothyroidism 10/02/2013   Blind right eye 09/30/2013   Dyslipidemia 09/30/2013   Hypertension    Graves disease     Palliative Care Assessment & Plan   [name]  with past medical history of invasive cancer of larynx cancer (s/p rad tx to neck, chemo- 2015) with recurrence of bulky mass and lymph node metastasis in 01/2019- started on Keytruda- last received treatment on 3/27. Recent admission for 4/16-4/17 for aspiration pnuemonia. 4/22 visit with his primary care physician notes discussion and recommendation for Hospice and durable DNR was completed. He was admitted on 04/21/2019 after having what appears to be frequently occurring incidence of becoming nonresponsive at home, workup revealed dehydration, malnutrition, hypothermia.He has a PEG tube and receives bolus feedings. Palliative medicine consulted for GOC.   Assessment/Recommendations/Plan   Referral made Hospice services to be in place before patient discharges home  Encouraged [facility] to return calls from providers so that care can be coordinated  DNR  Goals of Care and Additional Recommendations:  Limitations on Scope of Treatment: Avoid Hospitalization and Minimize Medications  Code Status:  DNR  Prognosis:   < 6 months  Discharge Planning:  Home with Hospice  Care plan was discussed with patient's caregiver- Moore.  Thank you for allowing the Palliative Medicine Team to assist in the care of this patient.   Time In: 1000 Time Out: 1035 Total Time 35 minutes Prolonged Time Billed  No      Greater than 50%  of this time was spent counseling and coordinating care related to the above assessment and plan.  [name], AGNP-C Palliative Medicine   Please contact Palliative Medicine Team phone at [phone] for questions  and concerns.

## 2019-04-26 NOTE — Plan of Care (Signed)
  Problem: Education: Goal: Knowledge of General Education information will improve Description Including pain rating scale, medication(s)/side effects and non-pharmacologic comfort measures 04/26/2019 1408 by Rance Muir, RN Outcome: Adequate for Discharge 04/26/2019 1132 by Rance Muir, RN Outcome: Progressing   Problem: Health Behavior/Discharge Planning: Goal: Ability to manage health-related needs will improve 04/26/2019 1408 by Rance Muir, RN Outcome: Adequate for Discharge 04/26/2019 1132 by Rance Muir, RN Outcome: Progressing   Problem: Clinical Measurements: Goal: Ability to maintain clinical measurements within normal limits will improve 04/26/2019 1408 by Rance Muir, RN Outcome: Adequate for Discharge 04/26/2019 1132 by Rance Muir, RN Outcome: Progressing Goal: Will remain free from infection 04/26/2019 1408 by Rance Muir, RN Outcome: Adequate for Discharge 04/26/2019 1132 by Rance Muir, RN Outcome: Progressing Goal: Diagnostic test results will improve 04/26/2019 1408 by Rance Muir, RN Outcome: Adequate for Discharge 04/26/2019 1132 by Rance Muir, RN Outcome: Progressing Goal: Respiratory complications will improve 04/26/2019 1408 by Rance Muir, RN Outcome: Adequate for Discharge 04/26/2019 1132 by Rance Muir, RN Outcome: Progressing Goal: Cardiovascular complication will be avoided 04/26/2019 1408 by Rance Muir, RN Outcome: Adequate for Discharge 04/26/2019 1132 by Rance Muir, RN Outcome: Progressing   Problem: Activity: Goal: Risk for activity intolerance will decrease 04/26/2019 1408 by Rance Muir, RN Outcome: Adequate for Discharge 04/26/2019 1132 by Rance Muir, RN Outcome: Progressing   Problem: Nutrition: Goal: Adequate nutrition will be maintained 04/26/2019 1408 by Rance Muir, RN Outcome: Adequate for Discharge 04/26/2019 1132 by Rance Muir, RN Outcome: Progressing   Problem: Coping: Goal: Level of anxiety will decrease 04/26/2019 1408 by Rance Muir, RN Outcome: Adequate for  Discharge 04/26/2019 1132 by Rance Muir, RN Outcome: Progressing   Problem: Elimination: Goal: Will not experience complications related to bowel motility 04/26/2019 1408 by Rance Muir, RN Outcome: Adequate for Discharge 04/26/2019 1132 by Rance Muir, RN Outcome: Progressing Goal: Will not experience complications related to urinary retention 04/26/2019 1408 by Rance Muir, RN Outcome: Adequate for Discharge 04/26/2019 1132 by Rance Muir, RN Outcome: Progressing   Problem: Pain Managment: Goal: General experience of comfort will improve 04/26/2019 1408 by Rance Muir, RN Outcome: Adequate for Discharge 04/26/2019 1132 by Rance Muir, RN Outcome: Progressing   Problem: Safety: Goal: Ability to remain free from injury will improve 04/26/2019 1408 by Rance Muir, RN Outcome: Adequate for Discharge 04/26/2019 1132 by Rance Muir, RN Outcome: Progressing   Problem: Skin Integrity: Goal: Risk for impaired skin integrity will decrease 04/26/2019 1408 by Rance Muir, RN Outcome: Adequate for Discharge 04/26/2019 1132 by Rance Muir, RN Outcome: Progressing

## 2019-04-26 NOTE — Progress Notes (Signed)
Pt has patent stoma. No O2 needed. SPO2 100%. BBS noted clear and diminished.

## 2019-04-27 DIAGNOSIS — C329 Malignant neoplasm of larynx, unspecified: Secondary | ICD-10-CM | POA: Diagnosis not present

## 2019-04-27 DIAGNOSIS — Z87891 Personal history of nicotine dependence: Secondary | ICD-10-CM | POA: Diagnosis not present

## 2019-04-27 DIAGNOSIS — Z9181 History of falling: Secondary | ICD-10-CM | POA: Diagnosis not present

## 2019-04-27 DIAGNOSIS — C4442 Squamous cell carcinoma of skin of scalp and neck: Secondary | ICD-10-CM | POA: Diagnosis not present

## 2019-04-27 DIAGNOSIS — C140 Malignant neoplasm of pharynx, unspecified: Secondary | ICD-10-CM | POA: Diagnosis not present

## 2019-04-27 DIAGNOSIS — Z79891 Long term (current) use of opiate analgesic: Secondary | ICD-10-CM | POA: Diagnosis not present

## 2019-04-27 DIAGNOSIS — Z8546 Personal history of malignant neoplasm of prostate: Secondary | ICD-10-CM | POA: Diagnosis not present

## 2019-04-27 DIAGNOSIS — Z431 Encounter for attention to gastrostomy: Secondary | ICD-10-CM | POA: Diagnosis not present

## 2019-04-28 ENCOUNTER — Ambulatory Visit (INDEPENDENT_AMBULATORY_CARE_PROVIDER_SITE_OTHER): Payer: Medicare HMO

## 2019-04-28 ENCOUNTER — Other Ambulatory Visit: Payer: Self-pay

## 2019-04-28 ENCOUNTER — Telehealth: Payer: Self-pay | Admitting: *Deleted

## 2019-04-28 ENCOUNTER — Emergency Department (HOSPITAL_COMMUNITY): Payer: Medicare HMO

## 2019-04-28 ENCOUNTER — Emergency Department (HOSPITAL_COMMUNITY)
Admission: EM | Admit: 2019-04-28 | Discharge: 2019-04-29 | Disposition: A | Discharge status: Home / Self Care | Payer: Medicare HMO | Attending: Emergency Medicine | Admitting: Emergency Medicine

## 2019-04-28 ENCOUNTER — Encounter (HOSPITAL_COMMUNITY): Payer: Self-pay | Admitting: Emergency Medicine

## 2019-04-28 DIAGNOSIS — Z85818 Personal history of malignant neoplasm of other sites of lip, oral cavity, and pharynx: Secondary | ICD-10-CM | POA: Insufficient documentation

## 2019-04-28 DIAGNOSIS — G9341 Metabolic encephalopathy: Secondary | ICD-10-CM | POA: Insufficient documentation

## 2019-04-28 DIAGNOSIS — Z79891 Long term (current) use of opiate analgesic: Secondary | ICD-10-CM

## 2019-04-28 DIAGNOSIS — Z87891 Personal history of nicotine dependence: Secondary | ICD-10-CM | POA: Insufficient documentation

## 2019-04-28 DIAGNOSIS — C140 Malignant neoplasm of pharynx, unspecified: Secondary | ICD-10-CM | POA: Diagnosis not present

## 2019-04-28 DIAGNOSIS — Z9181 History of falling: Secondary | ICD-10-CM

## 2019-04-28 DIAGNOSIS — Z043 Encounter for examination and observation following other accident: Secondary | ICD-10-CM | POA: Diagnosis not present

## 2019-04-28 DIAGNOSIS — Z8546 Personal history of malignant neoplasm of prostate: Secondary | ICD-10-CM

## 2019-04-28 DIAGNOSIS — C4442 Squamous cell carcinoma of skin of scalp and neck: Secondary | ICD-10-CM | POA: Diagnosis not present

## 2019-04-28 DIAGNOSIS — Z431 Encounter for attention to gastrostomy: Secondary | ICD-10-CM

## 2019-04-28 DIAGNOSIS — R569 Unspecified convulsions: Secondary | ICD-10-CM | POA: Diagnosis not present

## 2019-04-28 DIAGNOSIS — R400 Somnolence: Secondary | ICD-10-CM | POA: Insufficient documentation

## 2019-04-28 DIAGNOSIS — R4020 Unspecified coma: Secondary | ICD-10-CM | POA: Diagnosis not present

## 2019-04-28 DIAGNOSIS — C329 Malignant neoplasm of larynx, unspecified: Secondary | ICD-10-CM

## 2019-04-28 DIAGNOSIS — W19XXXA Unspecified fall, initial encounter: Secondary | ICD-10-CM

## 2019-04-28 DIAGNOSIS — R0902 Hypoxemia: Secondary | ICD-10-CM | POA: Diagnosis not present

## 2019-04-28 DIAGNOSIS — Z79899 Other long term (current) drug therapy: Secondary | ICD-10-CM | POA: Insufficient documentation

## 2019-04-28 DIAGNOSIS — R1310 Dysphagia, unspecified: Secondary | ICD-10-CM | POA: Insufficient documentation

## 2019-04-28 DIAGNOSIS — R404 Transient alteration of awareness: Secondary | ICD-10-CM | POA: Diagnosis not present

## 2019-04-28 DIAGNOSIS — R4182 Altered mental status, unspecified: Secondary | ICD-10-CM | POA: Diagnosis not present

## 2019-04-28 DIAGNOSIS — Y92009 Unspecified place in unspecified non-institutional (private) residence as the place of occurrence of the external cause: Secondary | ICD-10-CM

## 2019-04-28 LAB — COMPREHENSIVE METABOLIC PANEL
ALT: 44 U/L (ref 0–44)
AST: 48 U/L — ABNORMAL HIGH (ref 15–41)
Albumin: 3.6 g/dL (ref 3.5–5.0)
Alkaline Phosphatase: 104 U/L (ref 38–126)
Anion gap: 15 (ref 5–15)
BUN: 19 mg/dL (ref 8–23)
CO2: 21 mmol/L — ABNORMAL LOW (ref 22–32)
Calcium: 8.6 mg/dL — ABNORMAL LOW (ref 8.9–10.3)
Chloride: 94 mmol/L — ABNORMAL LOW (ref 98–111)
Creatinine, Ser: 0.72 mg/dL (ref 0.61–1.24)
GFR calc Af Amer: >60 mL/min (ref >60)
GFR calc non Af Amer: >60 mL/min (ref >60)
Glucose, Bld: 109 mg/dL — ABNORMAL HIGH (ref 70–99)
Potassium: 3.9 mmol/L (ref 3.5–5.1)
Sodium: 130 mmol/L — ABNORMAL LOW (ref 135–145)
Total Bilirubin: 0.7 mg/dL (ref 0.3–1.2)
Total Protein: 7 g/dL (ref 6.5–8.1)

## 2019-04-28 LAB — CBC WITH DIFFERENTIAL/PLATELET
Abs Immature Granulocytes: 0.04 10*3/uL (ref 0.00–0.07)
Basophils Absolute: 0 10*3/uL (ref 0.0–0.1)
Basophils Relative: 1 %
Eosinophils Absolute: 0 10*3/uL (ref 0.0–0.5)
Eosinophils Relative: 0 %
HCT: 39.6 % (ref 39.0–52.0)
Hemoglobin: 12.6 g/dL — ABNORMAL LOW (ref 13.0–17.0)
Immature Granulocytes: 1 %
Lymphocytes Relative: 7 %
Lymphs Abs: 0.6 10*3/uL — ABNORMAL LOW (ref 0.7–4.0)
MCH: 26 pg (ref 26.0–34.0)
MCHC: 31.8 g/dL (ref 30.0–36.0)
MCV: 81.8 fL (ref 80.0–100.0)
Monocytes Absolute: 0.6 10*3/uL (ref 0.1–1.0)
Monocytes Relative: 7 %
Neutro Abs: 7.3 10*3/uL (ref 1.7–7.7)
Neutrophils Relative %: 84 %
Platelets: 319 10*3/uL (ref 150–400)
RBC: 4.84 MIL/uL (ref 4.22–5.81)
RDW: 14.5 % (ref 11.5–15.5)
WBC: 8.6 10*3/uL (ref 4.0–10.5)
nRBC: 0 % (ref 0.0–0.2)

## 2019-04-28 LAB — CBG MONITORING, ED: Glucose-Capillary: 109 mg/dL — ABNORMAL HIGH (ref 70–99)

## 2019-04-28 LAB — URINALYSIS, COMPLETE (UACMP) WITH MICROSCOPIC
Bacteria, UA: NONE SEEN
Bilirubin Urine: NEGATIVE
Glucose, UA: NEGATIVE mg/dL
Hgb urine dipstick: NEGATIVE
Ketones, ur: 5 mg/dL — AB
Leukocytes,Ua: NEGATIVE
Nitrite: NEGATIVE
Protein, ur: NEGATIVE mg/dL
Specific Gravity, Urine: 1.005 (ref 1.005–1.030)
pH: 7 (ref 5.0–8.0)

## 2019-04-28 MED ORDER — ALPRAZOLAM 1 MG/ML PO CONC: 0.5000 mg | Freq: Three times a day (TID) | ORAL | 0 refills | Status: DC | PRN

## 2019-04-28 NOTE — Telephone Encounter (Signed)
I have sent in for this hospice patient a Xanax liquid I do not know if it is they are not because I do not commonly do these orders but if it is then let them know that I have sent it for them.

## 2019-04-28 NOTE — ED Triage Notes (Signed)
Pt 's wife states that pt hit his head on the wall, later on became altered.  HX of seizures. Denies taking seizure meds

## 2019-04-28 NOTE — Discharge Instructions (Signed)
Your exam today does not reveal any significant trauma to your head. Please refer to the attached instructions for symptoms to look for in the next 24-72 hours. Please continue to follow-up with your primary care provider.

## 2019-04-28 NOTE — Addendum Note (Signed)
Addended by: Caryl Pina on: 04/28/2019 04:39 PM   Modules accepted: Orders

## 2019-04-28 NOTE — Telephone Encounter (Signed)
Vm from [place] w/ [facility] Pt has just come home from hospital, he's a little agitated and anxious, a lot going on at home Could a liquid be called into help with this Please advise

## 2019-04-28 NOTE — ED Provider Notes (Signed)
[facility] EMERGENCY DEPARTMENT Provider Note   CSN: [id number] Arrival date & time: 04/28/19  1831    History   Chief Complaint Chief Complaint  Patient presents with  . Altered Mental Status    HPI Brandon Hall is a 63 y.o. male.     Patient presents to ED with complaint of altered mental status. Patient reportedly hit his head on a counter top at home, and has become progressively more lethargic. He was recently hospitalized (04/21/19) for altered mental status/lactic acidosis and on 04/14/19 for aspiration pneumonia. Pt has hx of head and neck cancer and dysphagia to all foods except liquids at baseline. Patient has a DNR. He has been recommended for hospice care.  The history is provided by a significant other. The history is limited by the absence of a caregiver and the condition of the patient.  Altered Mental Status  Presenting symptoms: lethargy and partial responsiveness   Most recent episode:  Today Episode history:  Unable to specify Chronicity:  Recurrent Context: head injury   Recent head injury:  Within the last 24 hours   Past Medical History:  Diagnosis Date  . Arthritis   . Blindness of right eye   . Cancer ([place])    pharyngeal ca  . History of radiation therapy 11/2014   TxN3MO squamous cell carcinoma of L neck, Stage IVB, unknown primary 70 Gy in 35 fractions treated at [facility], finished 12/25/14  . History of shingles    RASH  02-03-2014  PER PCP RESOLVING  . Hypertension    NO MEDS ,  MONITORED BY PCP  . Hypothyroidism, postradioiodine therapy   . Nonverbal   . Prostate cancer ([place]) 07/12/2014   DX  SEVERAL  YRS   AGO  WITH RADIATION THERAPY  . Squamous cell carcinoma of head and neck ([place]) 07/12/2014  . 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 Acute metabolic encephalopathy   . Palliative care by specialist   . Acute encephalopathy 04/25/2019  . Advanced care  planning/counseling discussion   . Altered mental state 04/22/2019  . Hypoglycemia 04/22/2019  . Elevated TSH 04/22/2019  . Hyponatremia   . Lactic acidosis   . Vomiting in adult   . Dehydration   . Aspiration pneumonia of right middle lobe due to vomit ([place])   . Tachycardia 04/14/2019  . Goals of care, counseling/discussion 01/31/2019  . DNR (do not resuscitate) 01/11/2019  . Cachexia ([place]) 01/11/2019  . Lung nodule 01/11/2019  . Status post laryngectomy 09/06/2018  . Severe protein-calorie malnutrition ([place]) 08/20/2018  . Laryngeal cancer ([place]) 08/19/2018  . Perianal abscess 08/10/2017  . Vitamin D deficiency 02/08/2015  . Squamous cell carcinoma of head and neck ([place]) 07/12/2014  . History of prostate cancer 07/12/2014  . Shingles rash 02/03/2014  . Abnormal transaminases 10/02/2013  . Uncontrolled Hypothyroidism 10/02/2013  . Blind right eye 09/30/2013  . Dyslipidemia 09/30/2013  . Hypertension   . Graves disease     Past Surgical History:  Procedure Laterality Date  . COLONOSCOPY  10/20/2011   Procedure: COLONOSCOPY;  Surgeon: Daneil Dolin, MD;  [facility];  Service: Endoscopy;  Laterality: N/A;  1:00 pm  . COLONOSCOPY N/A 02/03/2018   Procedure: COLONOSCOPY;  Surgeon: Daneil Dolin, MD;  [facility];  Service: Endoscopy;  Laterality: N/A;  2:15  . CRYOABLATION N/A 04/07/2014   Procedure: SALVAGE CRYO ABLATION PROSTATE;  Surgeon: Lowella Bandy, MD;  Location: [facility];  Service: Urology;  Laterality: N/A;  . DIRECT LARYNGOSCOPY N/A 09/06/2018   Procedure: DIRECT LARYNGOSCOPY biopsy with frozen section;  Surgeon: Izora Gala, MD;  Location: [place];  Service: ENT;  Laterality: N/A;  . IR GASTROSTOMY TUBE MOD SED  08/20/2018  . IR GENERIC HISTORICAL  03/27/2017   IR REMOVAL TUN ACCESS W/ PORT W/O FL MOD SED [facility]-INTERV RAD  . LARYNGETOMY N/A 09/06/2018   Procedure: total LARYNGECTOMY;  Surgeon: Izora Gala, MD;  Location: [place];  Service:  ENT;  Laterality: N/A;  . LARYNGOSCOPY    . LYMPH NODE BIOPSY    . RADICAL NECK DISSECTION Right 09/06/2018   Procedure: Modified NECK DISSECTION;  Surgeon: Izora Gala, MD;  Location: [place];  Service: ENT;  Laterality: Right;        Home Medications    Prior to Admission medications   Medication Sig Start Date End Date Taking? Authorizing Provider  Alprazolam 1 MG/ML CONC Take 0.5 mLs (0.5 mg total) by mouth 3 (three) times daily as needed (For agitation or anxiety as needed). 04/28/19   Dettinger, Fransisca Kaufmann, MD  HYDROcodone-acetaminophen (HYCET) 7.5-325 mg/15 ml solution TAKE (2) TEASPOONFULS EVERY FOUR HOURS AS NEEDED FOR PAIN. Patient taking differently: Place 10 mLs into feeding tube every 4 (four) hours as needed for moderate pain.  04/11/19   Glennie Isle, NP-C  levothyroxine (SYNTHROID) 150 MCG tablet Place 1 tablet (150 mcg total) into feeding tube daily at 6 (six) AM. 04/26/19   Tat, Shanon Brow, MD  magic mouthwash SOLN Take 5 mLs by mouth 3 (three) times daily as needed for mouth pain. 03/21/19   Dettinger, Fransisca Kaufmann, MD  metoprolol tartrate (LOPRESSOR) 25 MG tablet Take 0.5 tablets (12.5 mg total) by mouth 2 (two) times daily. 04/26/19   Orson Eva, MD  Nutritional Supplements (FEEDING SUPPLEMENT, OSMOLITE 1.5 CAL,) LIQD Place 237 mLs into feeding tube 5 (five) times daily. 04/25/19   Orson Eva, MD  nystatin (MYCOSTATIN) 100000 UNIT/ML suspension Take 5 mLs (500,000 Units total) by mouth 4 (four) times daily. 03/16/19   Dettinger, Fransisca Kaufmann, MD  Pembrolizumab (KEYTRUDA IV) Inject 200 mg into the vein every 21 ( twenty-one) days.     [provider]  polyethylene glycol (MIRALAX / GLYCOLAX) packet Take 17 g by mouth daily as needed for mild constipation or moderate constipation.     [provider]  pravastatin (PRAVACHOL) 40 MG tablet Take 1 tablet (40 mg total) by mouth daily. Patient taking differently: Place 40 mg into feeding tube daily.  11/11/18   Dettinger, Fransisca Kaufmann, MD  prochlorperazine (COMPAZINE) 10 MG tablet Take 1 tablet (10 mg total) by mouth every 6 (six) hours as needed (Nausea or vomiting). Patient taking differently: Place 5-10 mg into feeding tube every 6 (six) hours as needed for nausea or vomiting.  02/07/19   Derek Jack, MD  Vitamin D, Ergocalciferol, (DRISDOL) 1.25 MG (50000 UT) CAPS capsule TAKE 1 CAPSULE EVERY 7 DAYS. Patient taking differently: Place 50,000 Units into feeding tube every 7 (seven) days.  02/23/19   Dettinger, Fransisca Kaufmann, MD  Water For Irrigation, Sterile (FREE WATER) SOLN Place 500 mLs into feeding tube every 8 (eight) hours. 04/26/19   Orson Eva, MD    Family History Family History  Problem Relation Age of Onset  . Colon cancer Father   . Hypertension Mother   . Colon cancer Brother   . Colon cancer Brother  Social History Social History   Tobacco Use  . Smoking status: Former Smoker    Packs/day: 1.00    Years: 40.00    Pack years: 40.00    Types: Cigarettes    Last attempt to quit: 09/29/2013    Years since quitting: 5.5  . Smokeless tobacco: Never Used  Substance Use Topics  . Alcohol use: No  . Drug use: No     Allergies   Patient has no known allergies.   Review of Systems Review of Systems  Unable to perform ROS: Mental status change  Constitutional: Positive for activity change.  Patient also non-verbal.   Physical Exam Updated Vital Signs BP (!) 165/111   Pulse 95   Temp 98 F (36.7 C) (Oral)   Resp 18   Ht 6\' 1"  (1.854 m)   Wt 51.3 kg   SpO2 99%   BMI 14.91 kg/m   Physical Exam Vitals signs and nursing note reviewed.  Constitutional:      Appearance: He is not diaphoretic.  HENT:     Head: Atraumatic.  Neck:     Comments: Tracheostomy stoma Cardiovascular:     Rate and Rhythm: Normal rate and regular rhythm.     Pulses: Normal pulses.     Heart sounds: Normal heart sounds.  Pulmonary:     Effort: Pulmonary effort is normal.     Breath sounds: Normal  breath sounds.  Abdominal:     General: Abdomen is flat.     Comments: g-tube  Musculoskeletal:        General: No swelling or signs of injury.  Skin:    General: Skin is warm and dry.  Neurological:     Mental Status: He is lethargic.  Psychiatric:        Behavior: Behavior is uncooperative.      ED Treatments / Results  Labs (all labs ordered are listed, but only abnormal results are displayed) Labs Reviewed  COMPREHENSIVE METABOLIC PANEL - Abnormal; Notable for the following components:      Result Value   Sodium 130 (*)    Chloride 94 (*)    CO2 21 (*)    Glucose, Bld 109 (*)    Calcium 8.6 (*)    AST 48 (*)    All other components within normal limits  CBC WITH DIFFERENTIAL/PLATELET - Abnormal; Notable for the following components:   Hemoglobin 12.6 (*)    Lymphs Abs 0.6 (*)    All other components within normal limits  URINALYSIS, COMPLETE (UACMP) WITH MICROSCOPIC - Abnormal; Notable for the following components:   Color, Urine STRAW (*)    Ketones, ur 5 (*)    All other components within normal limits  CBG MONITORING, ED - Abnormal; Notable for the following components:   Glucose-Capillary 109 (*)    All other components within normal limits    EKG None  Radiology Ct Head Wo Contrast  Result Date: 04/28/2019 CLINICAL DATA:  63 year old male with altered mental status EXAM: CT HEAD WITHOUT CONTRAST TECHNIQUE: Contiguous axial images were obtained from the base of the skull through the vertex without intravenous contrast. COMPARISON:  04/21/2019 FINDINGS: Brain: No acute intracranial hemorrhage. No midline shift or mass effect. Gray-white differentiation maintained. Mineralization of the right inferior cerebellar hemisphere. Unremarkable appearance of the ventricular system. Vascular: Intracranial atherosclerosis Skull: No acute fracture.  No aggressive bone lesion identified. Sinuses/Orbits: Surgical changes of the right globe. Mild mucosal disease of the  paranasal sinuses. Other: None IMPRESSION: Negative  for acute intracranial abnormality. Electronically Signed   By: Corrie Mckusick D.O.   On: 04/28/2019 21:30    Procedures Procedures (including critical care time)  Medications Ordered in ED Medications - No data to display   Initial Impression / Assessment and Plan / ED Course  I have reviewed the triage vital signs and the nursing notes.  Pertinent labs & imaging results that were available during my care of the patient were reviewed by me and considered in my medical decision making (see chart for details).      Patient discussed with and seen by Dr. Eulis Foster.    On reassessment, patient awake. Communicates with nodding head yes or no, or with thumbs up. CT of head does not reveal any acute intracranial abnormality. His labs are at recent baseline. He appears safe for discharge to home at this time. Return precautions provided.  Final Clinical Impressions(s) / ED Diagnoses   Final diagnoses:  Fall in home, initial encounter  Somnolence    ED Discharge Orders    None       Etta Quill, NP 04/29/19 9563    Daleen Bo, MD 04/29/19 1101

## 2019-04-28 NOTE — ED Provider Notes (Signed)
  Face-to-face evaluation   History: Patient was at home with his wife when he fell and hit his head.  He was somewhat more altered afterwards so she had him sent here for evaluation.  Patient is unable to give history. Physical exam: Patient is alert, somewhat interactive by nodding head.  No external cranial injury.  Medical screening examination/treatment/procedure(s) were conducted as a shared visit with non-physician practitioner(s) and myself.  I personally evaluated the patient during the encounter    Daleen Bo, MD 04/29/19 1101

## 2019-04-29 ENCOUNTER — Other Ambulatory Visit: Payer: Self-pay

## 2019-04-29 DIAGNOSIS — R531 Weakness: Secondary | ICD-10-CM | POA: Diagnosis not present

## 2019-04-29 DIAGNOSIS — I10 Essential (primary) hypertension: Secondary | ICD-10-CM | POA: Diagnosis not present

## 2019-04-29 DIAGNOSIS — Z7689 Persons encountering health services in other specified circumstances: Secondary | ICD-10-CM | POA: Diagnosis not present

## 2019-04-29 DIAGNOSIS — R404 Transient alteration of awareness: Secondary | ICD-10-CM | POA: Diagnosis not present

## 2019-04-29 MED ORDER — ALPRAZOLAM 0.5 MG PO TABS: 0.5000 mg | ORAL_TABLET | Freq: Three times a day (TID) | ORAL | 0 refills | Status: DC | PRN

## 2019-04-29 NOTE — ED Notes (Signed)
Patient transported by EMS. Message left with family that patient will be returning home.

## 2019-04-29 NOTE — Addendum Note (Signed)
Addended by: Evelina Dun A on: 04/29/2019 05:26 PM   Modules accepted: Orders

## 2019-04-29 NOTE — ED Notes (Signed)
Patient unable to sign.  

## 2019-04-29 NOTE — Telephone Encounter (Signed)
Xanax changed to tablet. Prescription sent to pharmacy

## 2019-04-29 NOTE — Telephone Encounter (Signed)
Fax from [facility] from pharmacy: liquid not covered by insurance & ODT on backorder Could this be changed to regular tablets - pharmacy aware of his throat issues) Please advise

## 2019-04-30 ENCOUNTER — Encounter (HOSPITAL_COMMUNITY): Payer: Self-pay | Admitting: Emergency Medicine

## 2019-04-30 ENCOUNTER — Other Ambulatory Visit: Payer: Self-pay

## 2019-04-30 ENCOUNTER — Emergency Department (HOSPITAL_COMMUNITY): Payer: Medicare HMO

## 2019-04-30 ENCOUNTER — Emergency Department (HOSPITAL_COMMUNITY)
Admission: EM | Admit: 2019-04-30 | Discharge: 2019-04-30 | Disposition: A | Discharge status: Home / Self Care | Payer: Medicare HMO | Attending: Emergency Medicine | Admitting: Emergency Medicine

## 2019-04-30 DIAGNOSIS — R000 Tachycardia, unspecified: Secondary | ICD-10-CM | POA: Diagnosis not present

## 2019-04-30 DIAGNOSIS — R531 Weakness: Secondary | ICD-10-CM | POA: Diagnosis present

## 2019-04-30 DIAGNOSIS — R600 Localized edema: Secondary | ICD-10-CM | POA: Diagnosis not present

## 2019-04-30 DIAGNOSIS — R220 Localized swelling, mass and lump, head: Secondary | ICD-10-CM | POA: Diagnosis not present

## 2019-04-30 DIAGNOSIS — R52 Pain, unspecified: Secondary | ICD-10-CM | POA: Diagnosis not present

## 2019-04-30 DIAGNOSIS — Z8546 Personal history of malignant neoplasm of prostate: Secondary | ICD-10-CM | POA: Diagnosis not present

## 2019-04-30 DIAGNOSIS — R4182 Altered mental status, unspecified: Secondary | ICD-10-CM | POA: Diagnosis not present

## 2019-04-30 DIAGNOSIS — E039 Hypothyroidism, unspecified: Secondary | ICD-10-CM | POA: Diagnosis not present

## 2019-04-30 DIAGNOSIS — Z87891 Personal history of nicotine dependence: Secondary | ICD-10-CM | POA: Diagnosis not present

## 2019-04-30 DIAGNOSIS — I10 Essential (primary) hypertension: Secondary | ICD-10-CM | POA: Insufficient documentation

## 2019-04-30 DIAGNOSIS — Z79899 Other long term (current) drug therapy: Secondary | ICD-10-CM | POA: Insufficient documentation

## 2019-04-30 DIAGNOSIS — Z743 Need for continuous supervision: Secondary | ICD-10-CM | POA: Diagnosis not present

## 2019-04-30 DIAGNOSIS — R609 Edema, unspecified: Secondary | ICD-10-CM | POA: Diagnosis not present

## 2019-04-30 LAB — CBC WITH DIFFERENTIAL/PLATELET
Abs Immature Granulocytes: 0.02 10*3/uL (ref 0.00–0.07)
Basophils Absolute: 0 10*3/uL (ref 0.0–0.1)
Basophils Relative: 0 %
Eosinophils Absolute: 0 10*3/uL (ref 0.0–0.5)
Eosinophils Relative: 1 %
HCT: 39.4 % (ref 39.0–52.0)
Hemoglobin: 12.1 g/dL — ABNORMAL LOW (ref 13.0–17.0)
Immature Granulocytes: 0 %
Lymphocytes Relative: 18 %
Lymphs Abs: 0.9 10*3/uL (ref 0.7–4.0)
MCH: 25.6 pg — ABNORMAL LOW (ref 26.0–34.0)
MCHC: 30.7 g/dL (ref 30.0–36.0)
MCV: 83.5 fL (ref 80.0–100.0)
Monocytes Absolute: 0.7 10*3/uL (ref 0.1–1.0)
Monocytes Relative: 15 %
Neutro Abs: 3 10*3/uL (ref 1.7–7.7)
Neutrophils Relative %: 66 %
Platelets: 348 10*3/uL (ref 150–400)
RBC: 4.72 MIL/uL (ref 4.22–5.81)
RDW: 14.4 % (ref 11.5–15.5)
WBC: 4.6 10*3/uL (ref 4.0–10.5)
nRBC: 0 % (ref 0.0–0.2)

## 2019-04-30 LAB — BASIC METABOLIC PANEL WITH GFR
Anion gap: 12 (ref 5–15)
BUN: 20 mg/dL (ref 8–23)
CO2: 25 mmol/L (ref 22–32)
Calcium: 9.1 mg/dL (ref 8.9–10.3)
Chloride: 98 mmol/L (ref 98–111)
Creatinine, Ser: 0.6 mg/dL — ABNORMAL LOW (ref 0.61–1.24)
GFR calc Af Amer: >60 mL/min
GFR calc non Af Amer: >60 mL/min
Glucose, Bld: 80 mg/dL (ref 70–99)
Potassium: 3.6 mmol/L (ref 3.5–5.1)
Sodium: 135 mmol/L (ref 135–145)

## 2019-04-30 MED ORDER — SULFAMETHOXAZOLE-TRIMETHOPRIM 200-40 MG/5ML PO SUSP
20.0000 mL | Freq: Once | ORAL | Status: AC
  Administered 2019-04-30: 23:00:00 20 mL
  Filled 2019-04-30: qty 20

## 2019-04-30 MED ORDER — DOXYCYCLINE HYCLATE 100 MG PO TABS: 100.0000 mg | ORAL_TABLET | Freq: Once | ORAL | Status: DC

## 2019-04-30 MED ORDER — SULFAMETHOXAZOLE-TRIMETHOPRIM 200-40 MG/5ML PO SUSP: 20.0000 mL | Freq: Two times a day (BID) | ORAL | 0 refills | Status: DC

## 2019-04-30 NOTE — ED Provider Notes (Signed)
[facility] EMERGENCY DEPARTMENT Provider Note   CSN: [id number] Arrival date & time: 04/30/19  1724    History   Chief Complaint Chief Complaint  Patient presents with  . Weakness    HPI Brandon Hall is a 63 y.o. male.     Level 5 caveat for inability to speak clearly.  Right cheek swelling for unknown length of time.  No fever or chills.  Patient is under hospice care for advanced squamous cell carcinoma of the head and neck.  We were unable to locate the family by phone     Past Medical History:  Diagnosis Date  . Arthritis   . Blindness of right eye   . Cancer ([place])    pharyngeal ca  . History of radiation therapy 11/2014   TxN3MO squamous cell carcinoma of L neck, Stage IVB, unknown primary 70 Gy in 35 fractions treated at [facility], finished 12/25/14  . History of shingles    RASH  02-03-2014  PER PCP RESOLVING  . Hypertension    NO MEDS ,  MONITORED BY PCP  . Hypothyroidism, postradioiodine therapy   . Nonverbal   . Prostate cancer ([place]) 07/12/2014   DX  SEVERAL  YRS   AGO  WITH RADIATION THERAPY  . Squamous cell carcinoma of head and neck ([place]) 07/12/2014  . 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 Acute metabolic encephalopathy   . Palliative care by specialist   . Acute encephalopathy 04/25/2019  . Advanced care planning/counseling discussion   . Altered mental state 04/22/2019  . Hypoglycemia 04/22/2019  . Elevated TSH 04/22/2019  . Hyponatremia   . Lactic acidosis   . Vomiting in adult   . Dehydration   . Aspiration pneumonia of right middle lobe due to vomit ([place])   . Tachycardia 04/14/2019  . Goals of care, counseling/discussion 01/31/2019  . DNR (do not resuscitate) 01/11/2019  . Cachexia ([place]) 01/11/2019  . Lung nodule 01/11/2019  . Status post laryngectomy 09/06/2018  . Severe protein-calorie malnutrition ([place]) 08/20/2018  . Laryngeal cancer ([place]) 08/19/2018  .  Perianal abscess 08/10/2017  . Vitamin D deficiency 02/08/2015  . Squamous cell carcinoma of head and neck ([place]) 07/12/2014  . History of prostate cancer 07/12/2014  . Shingles rash 02/03/2014  . Abnormal transaminases 10/02/2013  . Uncontrolled Hypothyroidism 10/02/2013  . Blind right eye 09/30/2013  . Dyslipidemia 09/30/2013  . Hypertension   . Graves disease     Past Surgical History:  Procedure Laterality Date  . COLONOSCOPY  10/20/2011   Procedure: COLONOSCOPY;  Surgeon: Daneil Dolin, MD;  [facility];  Service: Endoscopy;  Laterality: N/A;  1:00 pm  . COLONOSCOPY N/A 02/03/2018   Procedure: COLONOSCOPY;  Surgeon: Daneil Dolin, MD;  [facility];  Service: Endoscopy;  Laterality: N/A;  2:15  . CRYOABLATION N/A 04/07/2014   Procedure: SALVAGE CRYO ABLATION PROSTATE;  Surgeon: Lowella Bandy, MD;  Location: [facility];  Service: Urology;  Laterality: N/A;  . DIRECT LARYNGOSCOPY N/A 09/06/2018   Procedure: DIRECT LARYNGOSCOPY biopsy with frozen section;  Surgeon: Izora Gala, MD;  Location: [place];  Service: ENT;  Laterality: N/A;  . IR GASTROSTOMY TUBE MOD SED  08/20/2018  . IR GENERIC HISTORICAL  03/27/2017   IR REMOVAL TUN ACCESS W/ PORT W/O FL MOD SED [facility]-INTERV RAD  . LARYNGETOMY N/A 09/06/2018   Procedure: total LARYNGECTOMY;  Surgeon: Constance Holster,  Garret Reddish, MD;  Location: [place];  Service: ENT;  Laterality: N/A;  . LARYNGOSCOPY    . LYMPH NODE BIOPSY    . RADICAL NECK DISSECTION Right 09/06/2018   Procedure: Modified NECK DISSECTION;  Surgeon: Izora Gala, MD;  Location: [place];  Service: ENT;  Laterality: Right;        Home Medications    Prior to Admission medications   Medication Sig Start Date End Date Taking? Authorizing Provider  ALPRAZolam Duanne Moron) 0.5 MG tablet Take 1 tablet (0.5 mg total) by mouth 3 (three) times daily as needed for anxiety. 04/29/19   Evelina Dun A, FNP  HYDROcodone-acetaminophen (HYCET) 7.5-325 mg/15 ml solution TAKE (2)  TEASPOONFULS EVERY FOUR HOURS AS NEEDED FOR PAIN. Patient taking differently: Place 10 mLs into feeding tube every 4 (four) hours as needed for moderate pain.  04/11/19   Glennie Isle, NP-C  levothyroxine (SYNTHROID) 150 MCG tablet Place 1 tablet (150 mcg total) into feeding tube daily at 6 (six) AM. 04/26/19   Tat, Shanon Brow, MD  magic mouthwash SOLN Take 5 mLs by mouth 3 (three) times daily as needed for mouth pain. 03/21/19   Dettinger, Fransisca Kaufmann, MD  metoprolol tartrate (LOPRESSOR) 25 MG tablet Take 0.5 tablets (12.5 mg total) by mouth 2 (two) times daily. 04/26/19   Orson Eva, MD  Nutritional Supplements (FEEDING SUPPLEMENT, OSMOLITE 1.5 CAL,) LIQD Place 237 mLs into feeding tube 5 (five) times daily. 04/25/19   Orson Eva, MD  nystatin (MYCOSTATIN) 100000 UNIT/ML suspension Take 5 mLs (500,000 Units total) by mouth 4 (four) times daily. 03/16/19   Dettinger, Fransisca Kaufmann, MD  Pembrolizumab (KEYTRUDA IV) Inject 200 mg into the vein every 21 ( twenty-one) days.     [provider]  polyethylene glycol (MIRALAX / GLYCOLAX) packet Take 17 g by mouth daily as needed for mild constipation or moderate constipation.     [provider]  pravastatin (PRAVACHOL) 40 MG tablet Take 1 tablet (40 mg total) by mouth daily. Patient taking differently: Place 40 mg into feeding tube daily.  11/11/18   Dettinger, Fransisca Kaufmann, MD  prochlorperazine (COMPAZINE) 10 MG tablet Take 1 tablet (10 mg total) by mouth every 6 (six) hours as needed (Nausea or vomiting). Patient taking differently: Place 5-10 mg into feeding tube every 6 (six) hours as needed for nausea or vomiting.  02/07/19   Derek Jack, MD  sulfamethoxazole-trimethoprim (BACTRIM) 200-40 MG/5ML suspension Place 20 mLs into feeding tube 2 (two) times daily for 7 days. 04/30/19 05/07/19  Nat Christen, MD  Vitamin D, Ergocalciferol, (DRISDOL) 1.25 MG (50000 UT) CAPS capsule TAKE 1 CAPSULE EVERY 7 DAYS. Patient taking differently: Place 50,000 Units  into feeding tube every 7 (seven) days.  02/23/19   Dettinger, Fransisca Kaufmann, MD  Water For Irrigation, Sterile (FREE WATER) SOLN Place 500 mLs into feeding tube every 8 (eight) hours. 04/26/19   Orson Eva, MD    Family History Family History  Problem Relation Age of Onset  . Colon cancer Father   . Hypertension Mother   . Colon cancer Brother   . Colon cancer Brother     Social History Social History   Tobacco Use  . Smoking status: Former Smoker    Packs/day: 1.00    Years: 40.00    Pack years: 40.00    Types: Cigarettes    Last attempt to quit: 09/29/2013    Years since quitting: 5.5  . Smokeless tobacco: Never Used  Substance Use Topics  . Alcohol  use: No  . Drug use: No     Allergies   Patient has no known allergies.   Review of Systems Review of Systems  Unable to perform ROS: Other     Physical Exam Updated Vital Signs BP 133/83 (BP Location: Right Arm)   Pulse 88   Temp (!) 97.5 F (36.4 C) (Oral)   Resp 18   Ht 6\' 1"  (1.854 m)   Wt 52 kg   SpO2 96%   BMI 15.12 kg/m   Physical Exam Vitals signs and nursing note reviewed.  Constitutional:      Appearance: He is well-developed.     Comments: Nad;  Does not talk  HENT:     Head: Atraumatic.     Comments: Area of swelling at the angle of the right mandible Eyes:     Conjunctiva/sclera: Conjunctivae normal.  Neck:     Musculoskeletal: Neck supple.  Cardiovascular:     Rate and Rhythm: Normal rate and regular rhythm.  Pulmonary:     Effort: Pulmonary effort is normal.     Breath sounds: Normal breath sounds.  Abdominal:     General: Bowel sounds are normal.     Palpations: Abdomen is soft.  Musculoskeletal: Normal range of motion.  Skin:    General: Skin is warm and dry.  Neurological:     Mental Status: He is oriented to person, place, and time.  Psychiatric:        Behavior: Behavior normal.      ED Treatments / Results  Labs (all labs ordered are listed, but only abnormal results  are displayed) Labs Reviewed  CBC WITH DIFFERENTIAL/PLATELET - Abnormal; Notable for the following components:      Result Value   Hemoglobin 12.1 (*)    MCH 25.6 (*)    All other components within normal limits  BASIC METABOLIC PANEL - Abnormal; Notable for the following components:   Creatinine, Ser 0.60 (*)    All other components within normal limits    EKG None  Radiology Ct Maxillofacial Wo Contrast  Result Date: 04/30/2019 CLINICAL DATA:  Right facial swelling. History of head and neck cancer. EXAM: CT MAXILLOFACIAL WITHOUT CONTRAST TECHNIQUE: Multidetector CT imaging of the maxillofacial structures was performed. Multiplanar CT image reconstructions were also generated. COMPARISON:  PET/CT 01/26/2019.  Neck CT 12/23/2018. FINDINGS: Osseous: No fracture or destructive osseous lesion. Edentulous. Bulky anterior vertebral osteophytes in the cervical spine. Orbits: Right eye enucleation.  No acute finding. Sinuses: Mild right maxillary sinus mucosal thickening. Clear mastoid air cells. Soft tissues: Postoperative changes from prior low laryngectomy are partially visualized. There is abnormal soft tissue involving the oropharynx asymmetric to the right with complete effacement of the oropharyngeal airway. Assessment is limited by lack of IV contrast and motion artifact with a recurrent 3 cm tumor present in the right oropharynx on the prior studies. Asymmetric soft tissue in the right neck in level II is poorly defined on this study with lymphadenopathy shown on the previous examinations. Previously demonstrated left-sided cervical lymphadenopathy is also not well assessed today. Punctate calcifications are again seen in the parotid glands. The submandibular glands are poorly visualized. Two small nodular soft tissue foci at the posterior aspect of a submental lipoma did not demonstrate increased FDG uptake on the prior PET-CT. Diffuse subcutaneous soft tissue swelling is present in the lower  face bilaterally. There is asymmetric low to intermediate attenuation soft tissue thickening anterior to the right mandibular body measuring approximately 6.5 x 2.6  cm on axial images which is new from the prior studies and extends to the skin surface. The lips appear diffusely swollen. A drainable fluid collection is not evident on this unenhanced study. 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 IMPRESSION: 1. Motion degraded noncontrast examination. 2. Asymmetric soft tissue swelling anterior to the right mandible, possibly cellulitis and phlegmon if there are clinical signs of infection. No drainable fluid collection identified on this limited study. 3. Abnormal soft tissue in the oropharynx effacing the airway compatible with previously demonstrated recurrent tumor. Poor assessment of recurrent pharyngeal tumor and known cervical lymphadenopathy on today's study. Electronically Signed   By: Logan Bores M.D.   On: 04/30/2019 20:08    Procedures Procedures (including critical care time)  Medications Ordered in ED Medications  sulfamethoxazole-trimethoprim (BACTRIM) 200-40 MG/5ML suspension 20 mL (has no administration in time range)     Initial Impression / Assessment and Plan / ED Course  I have reviewed the triage vital signs and the nursing notes.  Pertinent labs & imaging results that were available during my care of the patient were reviewed by me and considered in my medical decision making (see chart for details).        Patient presents with swelling in the right mandibular area for an unknown length of time.  Maxillofacial CT shows no mandible abscess.  Will start Septra suspension.  Patient is under hospice care.  Final Clinical Impressions(s) / ED Diagnoses   Final diagnoses:  Facial swelling    ED Discharge Orders         Ordered    sulfamethoxazole-trimethoprim (BACTRIM) 200-40 MG/5ML  suspension  2 times daily     04/30/19 2240           Nat Christen, MD 04/30/19 2243

## 2019-04-30 NOTE — Discharge Instructions (Addendum)
Prescription for liquid antibiotic to be fed through the gastrostomy tube.  Follow-up with your regular doctor next week.

## 2019-04-30 NOTE — ED Notes (Signed)
Multiple attempts made to contact family without success.

## 2019-04-30 NOTE — ED Notes (Signed)
Family contacted and patient sent home by [facility].

## 2019-04-30 NOTE — ED Triage Notes (Signed)
Pt brought in by ems after wife called for jaw pain.  Pt has swelling to right side of face, assumed chronic from cancer. Wife told ems pt is declining and we need to "do what we need to do".

## 2019-05-02 ENCOUNTER — Encounter: Payer: Self-pay | Admitting: Family Medicine

## 2019-05-02 ENCOUNTER — Ambulatory Visit (INDEPENDENT_AMBULATORY_CARE_PROVIDER_SITE_OTHER): Payer: Medicare HMO | Admitting: Family Medicine

## 2019-05-02 DIAGNOSIS — R410 Disorientation, unspecified: Secondary | ICD-10-CM | POA: Diagnosis not present

## 2019-05-02 DIAGNOSIS — R531 Weakness: Secondary | ICD-10-CM | POA: Diagnosis not present

## 2019-05-02 DIAGNOSIS — C760 Malignant neoplasm of head, face and neck: Secondary | ICD-10-CM | POA: Diagnosis not present

## 2019-05-02 DIAGNOSIS — R627 Adult failure to thrive: Secondary | ICD-10-CM | POA: Diagnosis not present

## 2019-05-02 NOTE — Progress Notes (Signed)
Virtual Visit via telephone Note   I connected with Brandon Hall on 05/02/19 at 1255 by telephone and verified that I am speaking with the correct person using two identifiers. Brandon Hall is currently located at home and Brandon Hall are currently with her during visit. The provider, Brandon Kaufmann Hagar Sadiq, MD is located in their office at time of visit.  Call ended at 1318  I discussed the limitations, risks, security and privacy concerns of performing an evaluation and management service by telephone and the availability of in person appointments. I also discussed with the patient that there may be a patient responsible charge related to this service. The patient expressed understanding and agreed to proceed.   History and Present Illness: Patient had a fall and went to ED and another time went for facial swelling.  Patient has been acting confused and upset at times. He has been upset and more confused over the past couple months.  We received report from [facility] that they are looking at emergency placement. He gets upset easily and has been having a fall risk.  Patient came back from the hospital with a sore on his buttocks after leaving the hospital on 04/28/2019  No diagnosis found.  Outpatient Encounter Medications as of 05/02/2019  Medication Sig  . ALPRAZolam (XANAX) 0.5 MG tablet Take 1 tablet (0.5 mg total) by mouth 3 (three) times daily as needed for anxiety.  Marland Kitchen HYDROcodone-acetaminophen (HYCET) 7.5-325 mg/15 ml solution TAKE (2) TEASPOONFULS EVERY FOUR HOURS AS NEEDED FOR PAIN. (Patient taking differently: Place 10 mLs into feeding tube every 4 (four) hours as needed for moderate pain. )  . levothyroxine (SYNTHROID) 150 MCG tablet Place 1 tablet (150 mcg total) into feeding tube daily at 6 (six) AM.  . magic mouthwash SOLN Take 5 mLs by mouth 3 (three) times daily as needed for mouth pain.  . metoprolol tartrate (LOPRESSOR) 25 MG tablet Take 0.5 tablets (12.5 mg total) by mouth 2 (two)  times daily.  . Nutritional Supplements (FEEDING SUPPLEMENT, OSMOLITE 1.5 CAL,) LIQD Place 237 mLs into feeding tube 5 (five) times daily.  Marland Kitchen nystatin (MYCOSTATIN) 100000 UNIT/ML suspension Take 5 mLs (500,000 Units total) by mouth 4 (four) times daily.  . Pembrolizumab (KEYTRUDA IV) Inject 200 mg into the vein every 21 ( twenty-one) days.   . polyethylene glycol (MIRALAX / GLYCOLAX) packet Take 17 g by mouth daily as needed for mild constipation or moderate constipation.   . pravastatin (PRAVACHOL) 40 MG tablet Take 1 tablet (40 mg total) by mouth daily. (Patient taking differently: Place 40 mg into feeding tube daily. )  . prochlorperazine (COMPAZINE) 10 MG tablet Take 1 tablet (10 mg total) by mouth every 6 (six) hours as needed (Nausea or vomiting). (Patient taking differently: Place 5-10 mg into feeding tube every 6 (six) hours as needed for nausea or vomiting. )  . sulfamethoxazole-trimethoprim (BACTRIM) 200-40 MG/5ML suspension Place 20 mLs into feeding tube 2 (two) times daily for 7 days.  . Vitamin D, Ergocalciferol, (DRISDOL) 1.25 MG (50000 UT) CAPS capsule TAKE 1 CAPSULE EVERY 7 DAYS. (Patient taking differently: Place 50,000 Units into feeding tube every 7 (seven) days. )  . Water For Irrigation, Sterile (FREE WATER) SOLN Place 500 mLs into feeding tube every 8 (eight) hours.   No facility-administered encounter medications on file as of 05/02/2019.     Review of Systems  Constitutional: Positive for activity change, appetite change and unexpected weight change. Negative for chills and fever.  Respiratory:  Negative for shortness of breath and wheezing.   Cardiovascular: Negative for chest pain and leg swelling.  Musculoskeletal: Negative for back pain and gait problem.  Skin: Positive for wound. Negative for rash.  Psychiatric/Behavioral: Positive for confusion and decreased concentration. Negative for self-injury, sleep disturbance and suicidal ideas.  All other systems reviewed and  are negative.   Observations/Objective: Brandon Hall was with the Brandon because husband is nonverbal and unable to speak on the phone.  Assessment and Plan: Problem List Items Addressed This Visit      Other   Squamous cell carcinoma of head and neck (HCC) - Primary   Relevant Medications   ondansetron (ZOFRAN-ODT) 4 MG disintegrating tablet    Other Visit Diagnoses    Brandon Hall       Failure to thrive in adult       Confusion           Follow Up Instructions:  As needed, patient is also been recommended for hospice but [facility] is recommending [place] 2 emergently right now.   I discussed the assessment and treatment plan with the patient. The patient was provided an opportunity to ask questions and all were answered. The patient agreed with the plan and demonstrated an understanding of the instructions.   The patient was advised to call back or seek an in-person evaluation if the symptoms worsen or if the condition fails to improve as anticipated.  The above assessment and management plan was discussed with the patient. The patient verbalized understanding of and has agreed to the management plan. Patient is aware to call the clinic if symptoms persist or worsen. Patient is aware when to return to the clinic for a follow-up visit. Patient educated on when it is appropriate to go to the emergency department.    I provided 23 minutes of non-face-to-face time during this encounter.    Worthy Rancher, MD

## 2019-05-05 ENCOUNTER — Ambulatory Visit (INDEPENDENT_AMBULATORY_CARE_PROVIDER_SITE_OTHER): Payer: Medicare HMO

## 2019-05-05 ENCOUNTER — Other Ambulatory Visit: Payer: Self-pay

## 2019-05-05 DIAGNOSIS — C4442 Squamous cell carcinoma of skin of scalp and neck: Secondary | ICD-10-CM

## 2019-05-05 DIAGNOSIS — Z79891 Long term (current) use of opiate analgesic: Secondary | ICD-10-CM | POA: Diagnosis not present

## 2019-05-05 DIAGNOSIS — C329 Malignant neoplasm of larynx, unspecified: Secondary | ICD-10-CM | POA: Diagnosis not present

## 2019-05-05 DIAGNOSIS — Z431 Encounter for attention to gastrostomy: Secondary | ICD-10-CM

## 2019-05-05 DIAGNOSIS — C140 Malignant neoplasm of pharynx, unspecified: Secondary | ICD-10-CM | POA: Diagnosis not present

## 2019-05-12 ENCOUNTER — Ambulatory Visit: Payer: Medicare HMO | Admitting: Family Medicine

## 2019-05-18 ENCOUNTER — Ambulatory Visit: Payer: 59 | Admitting: Family Medicine

## 2019-05-26 DIAGNOSIS — M1991 Primary osteoarthritis, unspecified site: Secondary | ICD-10-CM | POA: Diagnosis not present

## 2019-05-26 DIAGNOSIS — C140 Malignant neoplasm of pharynx, unspecified: Secondary | ICD-10-CM | POA: Diagnosis not present

## 2019-06-26 DIAGNOSIS — C140 Malignant neoplasm of pharynx, unspecified: Secondary | ICD-10-CM | POA: Diagnosis not present

## 2019-06-26 DIAGNOSIS — M1991 Primary osteoarthritis, unspecified site: Secondary | ICD-10-CM | POA: Diagnosis not present

## 2019-06-28 ENCOUNTER — Ambulatory Visit: Payer: Medicare HMO | Admitting: Urology

## 2019-07-12 ENCOUNTER — Telehealth: Payer: Self-pay | Admitting: Family Medicine

## 2019-07-12 NOTE — Chronic Care Management (AMB) (Signed)
Chronic Care Management   Outreach Note  07/12/2019 Name: Brandon Hall MRN: [id number] DOB: 08/01/1956  Referred by: Dettinger, Elige Radon, MD Reason for referral : Chronic Care Management (Initial CCM outreach was unsuccessful. )   An unsuccessful telephone outreach was attempted today. The patient was referred to the case management team by for assistance with chronic care management and care coordination.   Follow Up Plan: A HIPPA compliant phone message was left for the patient providing contact information and requesting a return call.  The care management team will reach out to the patient again over the next 7 days.  If patient returns call to provider office, please advise to call Embedded Care Management Care Guide Randon Goldsmith at [phone]  [facility] Guide  [facility]   Connected Care  [email or web address]   ??[phone]

## 2019-07-19 NOTE — Chronic Care Management (AMB) (Signed)
Chronic Care Management   Outreach Note  07/19/2019 Name: Koray Zappone MRN: [id number] DOB: 1956/10/26  Referred by: Dettinger, Elige Radon, MD Reason for referral : Chronic Care Management (Initial CCM outreach was unsuccessful. ) and Chronic Care Management (Second CCM outreach was unsuccessful.)   A second unsuccessful telephone outreach was attempted today. The patient was referred to the case management team for assistance with chronic care management and care coordination.   Follow Up Plan: The care management team will reach out to the patient again over the next 7 days.   Randon Goldsmith Care Guide  [facility]   Connected Care  [email or web address]   ??[phone]

## 2019-07-26 DIAGNOSIS — M1991 Primary osteoarthritis, unspecified site: Secondary | ICD-10-CM | POA: Diagnosis not present

## 2019-07-26 DIAGNOSIS — C140 Malignant neoplasm of pharynx, unspecified: Secondary | ICD-10-CM | POA: Diagnosis not present

## 2019-07-27 NOTE — Chronic Care Management (AMB) (Signed)
Chronic Care Management   Outreach Note  07/27/2019 Name: Brandon Hall MRN: [id number] DOB: Sep 02, 1956  Referred by: Dettinger, Elige Radon, MD Reason for referral : Chronic Care Management (Initial CCM outreach was unsuccessful. ), Chronic Care Management (Second CCM outreach was unsuccessful.), and Chronic Care Management (Third CCM outreac was unsuccessful. )   Third unsuccessful telephone outreach was attempted today. The patient was referred to the case management team for assistance with chronic care management and care coordination. The patient's primary care provider has been notified of our unsuccessful attempts to make or maintain contact with the patient. The care management team is pleased to engage with this patient at any time in the future should he/she be interested in assistance from the care management team.   Follow Up Plan: The care management team is available to follow up with the patient after provider conversation with the patient regarding recommendation for care management engagement and subsequent re-referral to the care management team.   [facility] Guide  [facility]   Connected Care  [email or web address]   ??[phone]

## 2019-08-24 IMAGING — CT CT HEAD WITHOUT CONTRAST
3 series · 16 of 47 positions shown, 19 images · non-contrast
Comparison: 04/21/2019

CLINICAL DATA: 63-year-old male with altered mental status

EXAM:
CT HEAD WITHOUT CONTRAST
TECHNIQUE: Contiguous axial images were obtained from the base of the skull
through the vertex without intravenous contrast.

[Series 4: head w o · axial · 0.44mm/px · z∈[+951,+1111]mm · 10 of 38 slices shown, 13 images]
[im 3/38  brain]
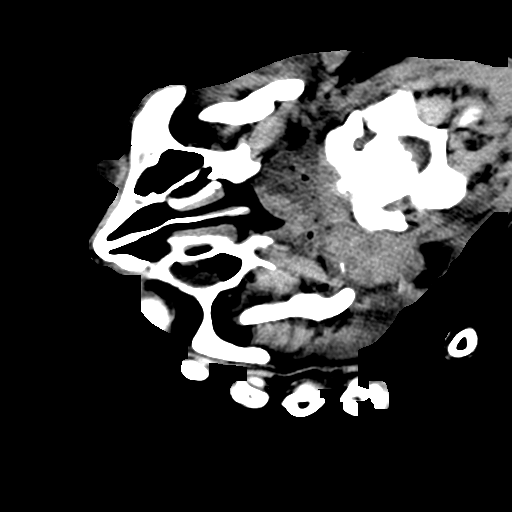
[im 3/38  bone]
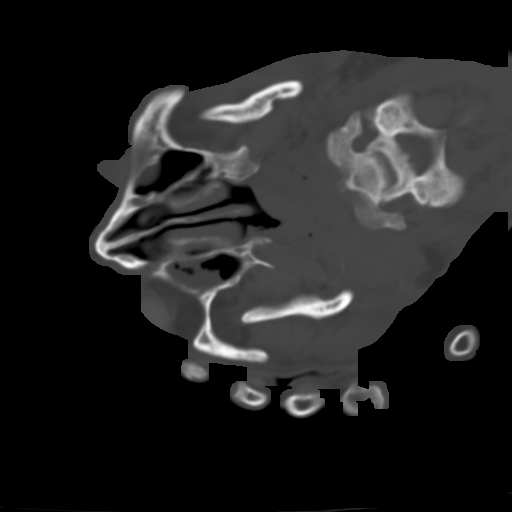
[im 7/38  brain]
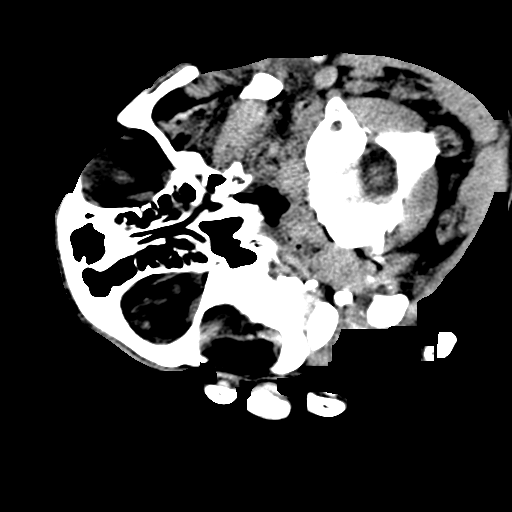
[im 11/38  brain]
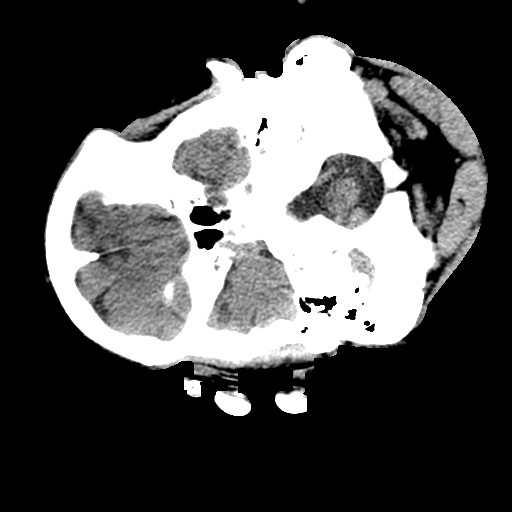
[im 13/38  brain]
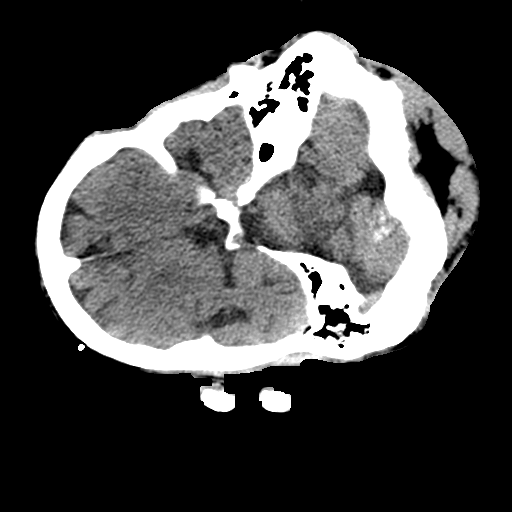
[im 17/38  brain]
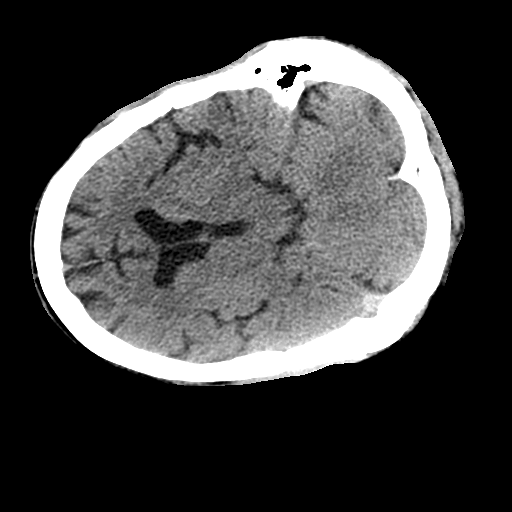
[im 17/38  bone]
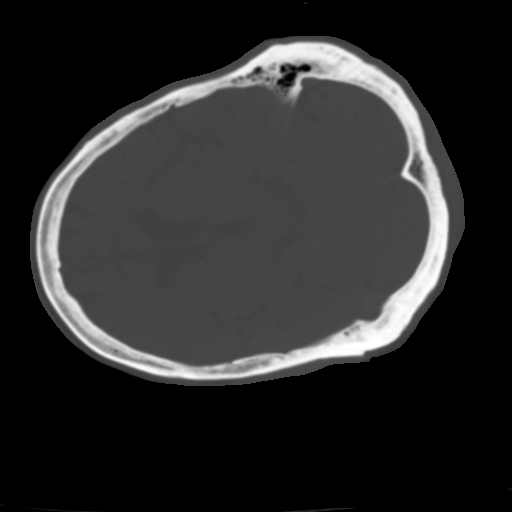
[im 21/38  brain]
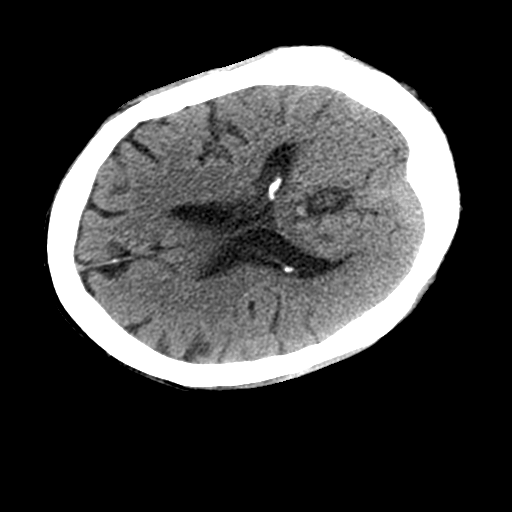
[im 25/38  brain]
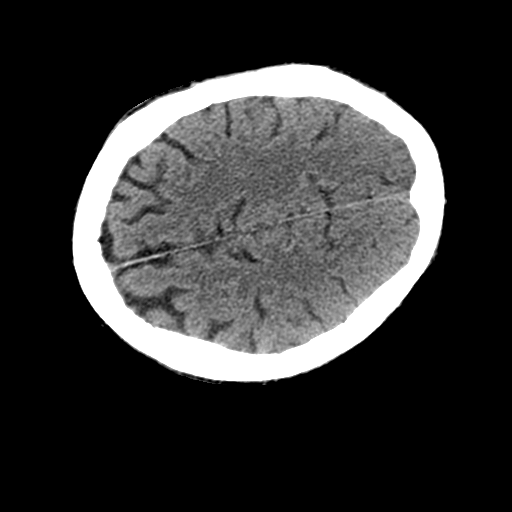
[im 29/38  brain]
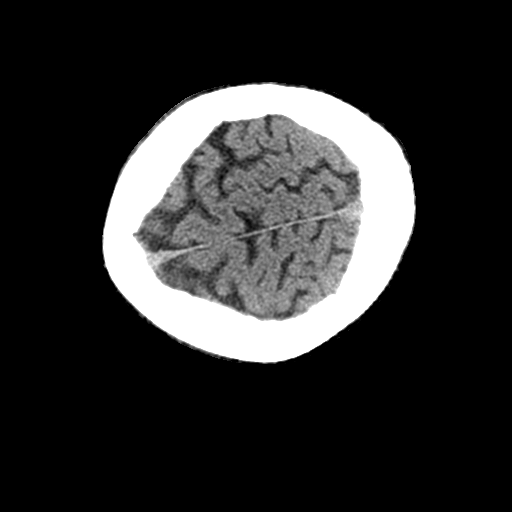
[im 31/38  brain]
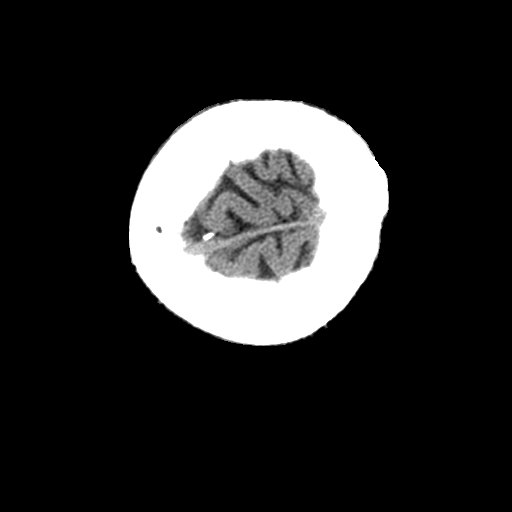
[im 31/38  bone]
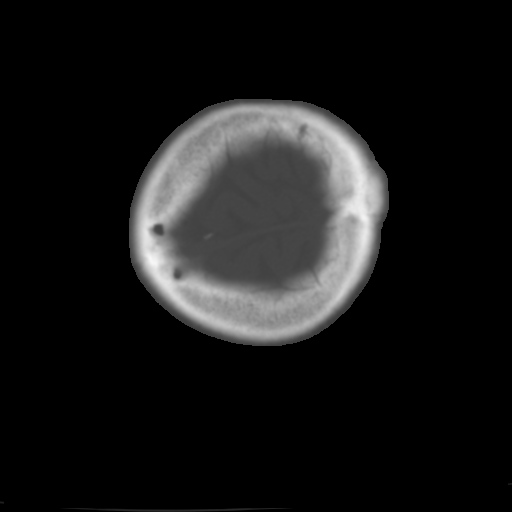
[im 35/38  brain]
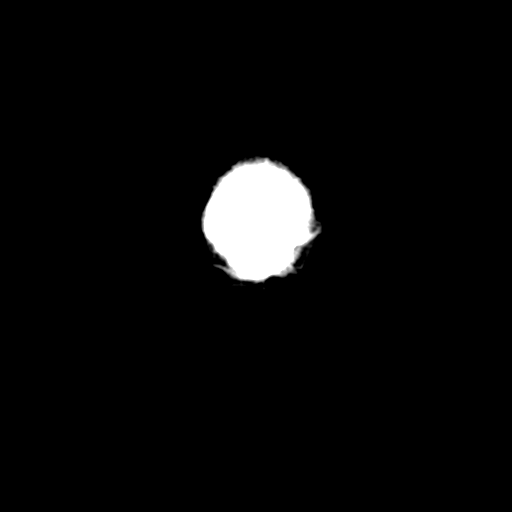

[Series 6: coronal soft · coronal · 0.31mm/px · 3 of 81 slices shown]
[im 27/81  brain]
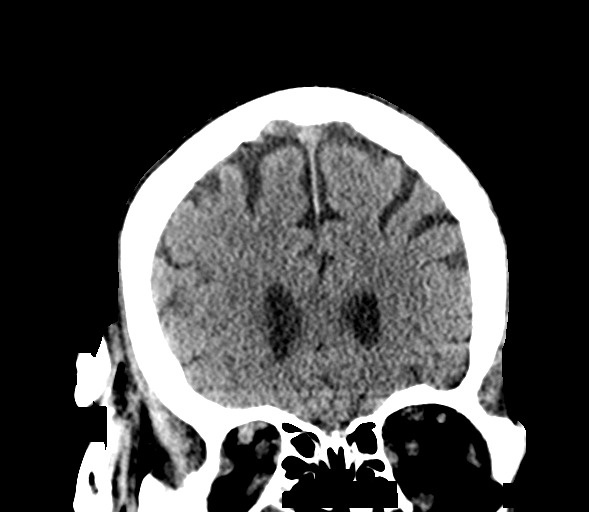
[im 36/81  brain]
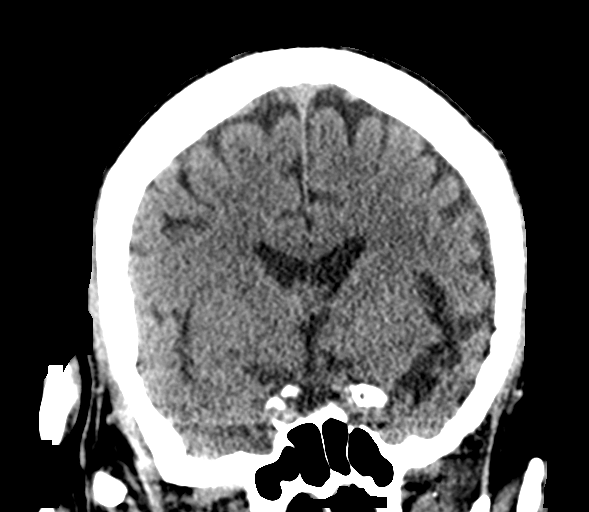
[im 45/81  brain]
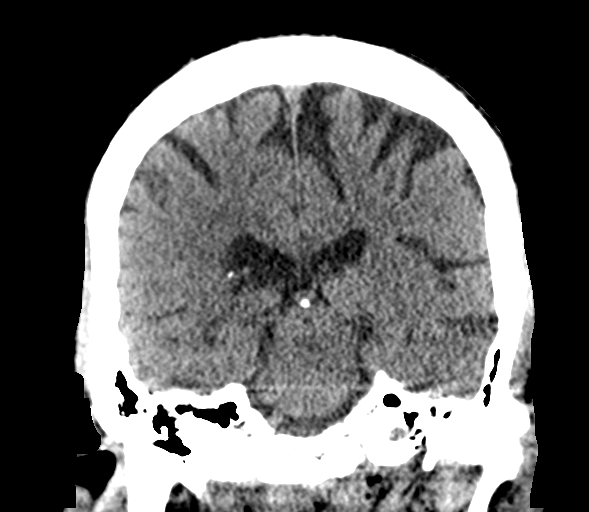

[Series 7: sagittal soft · sagittal · 0.37mm/px · 3 of 64 slices shown]
[im 23/64  brain]
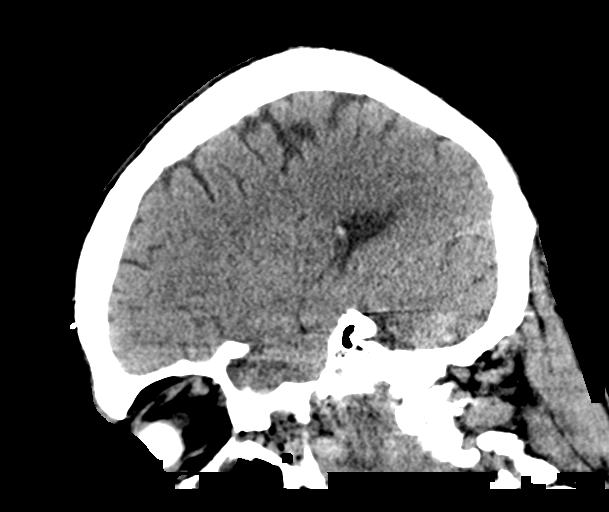
[im 32/64  brain]
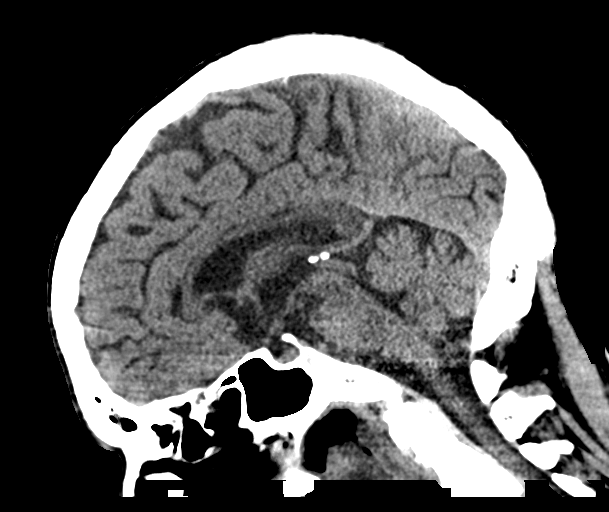
[im 42/64  brain]
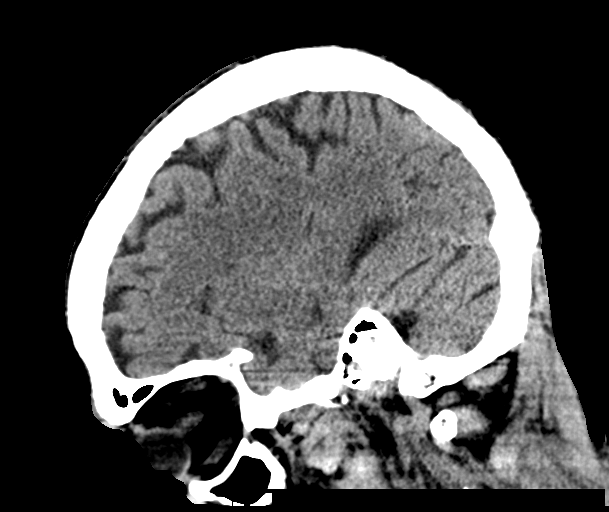

[16 of 47 positions shown; findings below may reference images not displayed]

FINDINGS: Brain: No acute intracranial hemorrhage. No midline shift or mass
effect. Gray-white differentiation maintained. Mineralization of the
right inferior cerebellar hemisphere. Unremarkable appearance of the
ventricular system.

Vascular: Intracranial atherosclerosis

Skull: No acute fracture.  No aggressive bone lesion identified.

Sinuses/Orbits: Surgical changes of the right globe. Mild mucosal
disease of the paranasal sinuses.

Other: None
IMPRESSION: Negative for acute intracranial abnormality.

## 2019-08-26 DIAGNOSIS — M1991 Primary osteoarthritis, unspecified site: Secondary | ICD-10-CM | POA: Diagnosis not present

## 2019-08-26 DIAGNOSIS — C140 Malignant neoplasm of pharynx, unspecified: Secondary | ICD-10-CM | POA: Diagnosis not present

## 2019-08-30 DEATH — deceased
